# Patient Record
Sex: Female | Born: 1950 | Race: White | Hispanic: No | Smoking: Never smoker
Health system: Southern US, Community
[De-identification: ages and names within clinical notes are randomized; demographics above are authoritative.]

## PROBLEM LIST (undated history)

## (undated) DIAGNOSIS — F419 Anxiety disorder, unspecified: Secondary | ICD-10-CM

## (undated) DIAGNOSIS — J302 Other seasonal allergic rhinitis: Secondary | ICD-10-CM

## (undated) DIAGNOSIS — Z860100 Personal history of colon polyps, unspecified: Secondary | ICD-10-CM

## (undated) DIAGNOSIS — T7840XA Allergy, unspecified, initial encounter: Secondary | ICD-10-CM

## (undated) DIAGNOSIS — H9319 Tinnitus, unspecified ear: Secondary | ICD-10-CM

## (undated) DIAGNOSIS — E039 Hypothyroidism, unspecified: Secondary | ICD-10-CM

## (undated) DIAGNOSIS — E7800 Pure hypercholesterolemia, unspecified: Secondary | ICD-10-CM

## (undated) DIAGNOSIS — G4733 Obstructive sleep apnea (adult) (pediatric): Secondary | ICD-10-CM

## (undated) DIAGNOSIS — M1990 Unspecified osteoarthritis, unspecified site: Secondary | ICD-10-CM

## (undated) DIAGNOSIS — M8580 Other specified disorders of bone density and structure, unspecified site: Secondary | ICD-10-CM

## (undated) DIAGNOSIS — F329 Major depressive disorder, single episode, unspecified: Secondary | ICD-10-CM

## (undated) DIAGNOSIS — F32A Depression, unspecified: Secondary | ICD-10-CM

## (undated) DIAGNOSIS — I340 Nonrheumatic mitral (valve) insufficiency: Secondary | ICD-10-CM

## (undated) DIAGNOSIS — I5189 Other ill-defined heart diseases: Secondary | ICD-10-CM

## (undated) DIAGNOSIS — I10 Essential (primary) hypertension: Secondary | ICD-10-CM

## (undated) DIAGNOSIS — G4730 Sleep apnea, unspecified: Secondary | ICD-10-CM

## (undated) HISTORY — DX: Personal history of colon polyps, unspecified: Z86.0100

## (undated) HISTORY — DX: Unspecified osteoarthritis, unspecified site: M19.90

## (undated) HISTORY — DX: Major depressive disorder, single episode, unspecified: F32.9

## (undated) HISTORY — DX: Other specified disorders of bone density and structure, unspecified site: M85.80

## (undated) HISTORY — DX: Other seasonal allergic rhinitis: J30.2

## (undated) HISTORY — PX: POLYPECTOMY: SHX149

## (undated) HISTORY — DX: Anxiety disorder, unspecified: F41.9

## (undated) HISTORY — DX: Nonrheumatic mitral (valve) insufficiency: I34.0

## (undated) HISTORY — DX: Personal history of colonic polyps: Z86.010

## (undated) HISTORY — DX: Sleep apnea, unspecified: G47.30

## (undated) HISTORY — PX: COLONOSCOPY: SHX174

## (undated) HISTORY — DX: Hypothyroidism, unspecified: E03.9

## (undated) HISTORY — DX: Allergy, unspecified, initial encounter: T78.40XA

## (undated) HISTORY — PX: REPLACEMENT TOTAL KNEE: SUR1224

## (undated) HISTORY — DX: Other ill-defined heart diseases: I51.89

## (undated) HISTORY — DX: Obstructive sleep apnea (adult) (pediatric): G47.33

---

## 1999-02-06 ENCOUNTER — Ambulatory Visit (HOSPITAL_COMMUNITY): Admission: RE | Admit: 1999-02-06 | Discharge: 1999-02-06 | Payer: Self-pay | Admitting: Obstetrics and Gynecology

## 1999-02-06 ENCOUNTER — Encounter: Payer: Self-pay | Admitting: Obstetrics and Gynecology

## 2000-04-15 ENCOUNTER — Ambulatory Visit (HOSPITAL_COMMUNITY): Admission: RE | Admit: 2000-04-15 | Discharge: 2000-04-15 | Payer: Self-pay | Admitting: Obstetrics and Gynecology

## 2000-04-15 ENCOUNTER — Encounter: Payer: Self-pay | Admitting: Obstetrics and Gynecology

## 2001-04-28 ENCOUNTER — Ambulatory Visit (HOSPITAL_COMMUNITY): Admission: RE | Admit: 2001-04-28 | Discharge: 2001-04-28 | Payer: Self-pay | Admitting: Obstetrics and Gynecology

## 2001-04-28 ENCOUNTER — Encounter: Payer: Self-pay | Admitting: Obstetrics and Gynecology

## 2002-02-17 ENCOUNTER — Emergency Department (HOSPITAL_COMMUNITY): Admission: EM | Admit: 2002-02-17 | Discharge: 2002-02-17 | Payer: Self-pay | Admitting: Emergency Medicine

## 2002-04-15 ENCOUNTER — Other Ambulatory Visit: Admission: RE | Admit: 2002-04-15 | Discharge: 2002-04-15 | Payer: Self-pay | Admitting: Obstetrics and Gynecology

## 2002-05-18 ENCOUNTER — Encounter: Payer: Self-pay | Admitting: Obstetrics and Gynecology

## 2002-05-18 ENCOUNTER — Ambulatory Visit (HOSPITAL_COMMUNITY): Admission: RE | Admit: 2002-05-18 | Discharge: 2002-05-18 | Payer: Self-pay | Admitting: Obstetrics and Gynecology

## 2003-02-10 ENCOUNTER — Encounter: Payer: Self-pay | Admitting: Internal Medicine

## 2003-04-18 ENCOUNTER — Other Ambulatory Visit: Admission: RE | Admit: 2003-04-18 | Discharge: 2003-04-18 | Payer: Self-pay | Admitting: Obstetrics and Gynecology

## 2003-06-07 ENCOUNTER — Encounter: Payer: Self-pay | Admitting: Obstetrics and Gynecology

## 2003-06-07 ENCOUNTER — Ambulatory Visit (HOSPITAL_COMMUNITY): Admission: RE | Admit: 2003-06-07 | Discharge: 2003-06-07 | Payer: Self-pay | Admitting: Obstetrics and Gynecology

## 2003-06-16 ENCOUNTER — Encounter: Admission: RE | Admit: 2003-06-16 | Discharge: 2003-06-16 | Payer: Self-pay | Admitting: Obstetrics and Gynecology

## 2003-06-16 ENCOUNTER — Encounter: Payer: Self-pay | Admitting: Obstetrics and Gynecology

## 2004-06-01 ENCOUNTER — Other Ambulatory Visit: Admission: RE | Admit: 2004-06-01 | Discharge: 2004-06-01 | Payer: Self-pay | Admitting: Obstetrics and Gynecology

## 2004-06-19 ENCOUNTER — Ambulatory Visit (HOSPITAL_COMMUNITY): Admission: RE | Admit: 2004-06-19 | Discharge: 2004-06-19 | Payer: Self-pay | Admitting: Obstetrics and Gynecology

## 2004-08-25 ENCOUNTER — Encounter: Admission: RE | Admit: 2004-08-25 | Discharge: 2004-08-25 | Payer: Self-pay | Admitting: Internal Medicine

## 2004-11-13 ENCOUNTER — Ambulatory Visit: Payer: Self-pay | Admitting: Internal Medicine

## 2004-11-19 ENCOUNTER — Ambulatory Visit: Payer: Self-pay | Admitting: Internal Medicine

## 2005-01-21 ENCOUNTER — Ambulatory Visit: Payer: Self-pay | Admitting: Internal Medicine

## 2005-02-27 ENCOUNTER — Ambulatory Visit: Payer: Self-pay | Admitting: Internal Medicine

## 2005-06-04 ENCOUNTER — Other Ambulatory Visit: Admission: RE | Admit: 2005-06-04 | Discharge: 2005-06-04 | Payer: Self-pay | Admitting: Obstetrics and Gynecology

## 2005-07-09 ENCOUNTER — Ambulatory Visit (HOSPITAL_COMMUNITY): Admission: RE | Admit: 2005-07-09 | Discharge: 2005-07-09 | Payer: Self-pay | Admitting: Obstetrics and Gynecology

## 2005-07-16 ENCOUNTER — Ambulatory Visit: Payer: Self-pay | Admitting: Internal Medicine

## 2005-12-17 ENCOUNTER — Ambulatory Visit: Payer: Self-pay | Admitting: Internal Medicine

## 2006-02-26 ENCOUNTER — Ambulatory Visit: Payer: Self-pay | Admitting: Family Medicine

## 2006-03-26 ENCOUNTER — Ambulatory Visit: Payer: Self-pay | Admitting: Internal Medicine

## 2006-04-01 ENCOUNTER — Ambulatory Visit: Payer: Self-pay | Admitting: Internal Medicine

## 2006-04-16 ENCOUNTER — Ambulatory Visit: Payer: Self-pay | Admitting: Internal Medicine

## 2006-05-13 ENCOUNTER — Ambulatory Visit: Payer: Self-pay | Admitting: Internal Medicine

## 2006-06-05 ENCOUNTER — Other Ambulatory Visit: Admission: RE | Admit: 2006-06-05 | Discharge: 2006-06-05 | Payer: Self-pay | Admitting: Obstetrics and Gynecology

## 2006-06-14 ENCOUNTER — Encounter: Admission: RE | Admit: 2006-06-14 | Discharge: 2006-06-14 | Payer: Self-pay | Admitting: Internal Medicine

## 2006-07-14 ENCOUNTER — Ambulatory Visit (HOSPITAL_COMMUNITY): Admission: RE | Admit: 2006-07-14 | Discharge: 2006-07-14 | Payer: Self-pay | Admitting: Obstetrics and Gynecology

## 2006-07-25 ENCOUNTER — Encounter: Payer: Self-pay | Admitting: Internal Medicine

## 2006-08-26 ENCOUNTER — Ambulatory Visit: Payer: Self-pay | Admitting: Internal Medicine

## 2006-09-10 ENCOUNTER — Ambulatory Visit: Payer: Self-pay | Admitting: Internal Medicine

## 2006-09-19 ENCOUNTER — Ambulatory Visit: Payer: Self-pay | Admitting: Internal Medicine

## 2006-09-26 ENCOUNTER — Ambulatory Visit: Payer: Self-pay | Admitting: Internal Medicine

## 2006-10-03 ENCOUNTER — Ambulatory Visit: Payer: Self-pay | Admitting: Internal Medicine

## 2007-01-15 ENCOUNTER — Ambulatory Visit: Payer: Self-pay | Admitting: Internal Medicine

## 2007-03-23 ENCOUNTER — Ambulatory Visit: Payer: Self-pay | Admitting: Internal Medicine

## 2007-04-22 ENCOUNTER — Inpatient Hospital Stay (HOSPITAL_COMMUNITY): Admission: RE | Admit: 2007-04-22 | Discharge: 2007-04-25 | Payer: Self-pay | Admitting: Orthopedic Surgery

## 2007-04-22 HISTORY — PX: TOTAL KNEE ARTHROPLASTY: SHX125

## 2007-05-07 ENCOUNTER — Encounter (INDEPENDENT_AMBULATORY_CARE_PROVIDER_SITE_OTHER): Payer: Self-pay | Admitting: Orthopedic Surgery

## 2007-05-07 ENCOUNTER — Ambulatory Visit (HOSPITAL_COMMUNITY): Admission: RE | Admit: 2007-05-07 | Discharge: 2007-05-07 | Payer: Self-pay | Admitting: Orthopedic Surgery

## 2007-06-16 ENCOUNTER — Ambulatory Visit: Payer: Self-pay | Admitting: Internal Medicine

## 2007-06-16 LAB — CONVERTED CEMR LAB
ALT: 21 units/L (ref 0–40)
Alkaline Phosphatase: 70 units/L (ref 39–117)
Bilirubin, Direct: 0.1 mg/dL (ref 0.0–0.3)
HDL: 48.9 mg/dL (ref >39.0)
LDL Cholesterol: 100 mg/dL — ABNORMAL HIGH (ref 0–99)
Total Bilirubin: 0.4 mg/dL (ref 0.3–1.2)
Triglycerides: 85 mg/dL (ref 0–149)

## 2007-06-24 ENCOUNTER — Ambulatory Visit (HOSPITAL_COMMUNITY): Admission: RE | Admit: 2007-06-24 | Discharge: 2007-06-24 | Payer: Self-pay | Admitting: Orthopedic Surgery

## 2007-06-29 DIAGNOSIS — M1990 Unspecified osteoarthritis, unspecified site: Secondary | ICD-10-CM | POA: Insufficient documentation

## 2007-06-29 DIAGNOSIS — E039 Hypothyroidism, unspecified: Secondary | ICD-10-CM | POA: Insufficient documentation

## 2007-06-29 DIAGNOSIS — J309 Allergic rhinitis, unspecified: Secondary | ICD-10-CM | POA: Insufficient documentation

## 2007-06-29 HISTORY — DX: Unspecified osteoarthritis, unspecified site: M19.90

## 2007-07-14 ENCOUNTER — Encounter: Payer: Self-pay | Admitting: Internal Medicine

## 2007-07-14 ENCOUNTER — Ambulatory Visit: Payer: Self-pay | Admitting: Internal Medicine

## 2007-07-14 DIAGNOSIS — E782 Mixed hyperlipidemia: Secondary | ICD-10-CM | POA: Insufficient documentation

## 2007-07-14 DIAGNOSIS — E785 Hyperlipidemia, unspecified: Secondary | ICD-10-CM | POA: Insufficient documentation

## 2007-07-23 ENCOUNTER — Ambulatory Visit: Payer: Self-pay | Admitting: Internal Medicine

## 2007-07-23 DIAGNOSIS — R350 Frequency of micturition: Secondary | ICD-10-CM | POA: Insufficient documentation

## 2007-07-23 DIAGNOSIS — R7309 Other abnormal glucose: Secondary | ICD-10-CM | POA: Insufficient documentation

## 2007-07-23 LAB — CONVERTED CEMR LAB: Hgb A1c MFr Bld: 5.8 % (ref 4.6–6.0)

## 2007-07-24 ENCOUNTER — Encounter: Payer: Self-pay | Admitting: Internal Medicine

## 2007-07-28 ENCOUNTER — Ambulatory Visit (HOSPITAL_COMMUNITY): Admission: RE | Admit: 2007-07-28 | Discharge: 2007-07-28 | Payer: Self-pay | Admitting: Obstetrics and Gynecology

## 2007-08-04 ENCOUNTER — Other Ambulatory Visit: Admission: RE | Admit: 2007-08-04 | Discharge: 2007-08-04 | Payer: Self-pay | Admitting: Obstetrics and Gynecology

## 2007-09-25 ENCOUNTER — Telehealth: Payer: Self-pay | Admitting: *Deleted

## 2007-10-15 ENCOUNTER — Ambulatory Visit: Payer: Self-pay | Admitting: Internal Medicine

## 2007-10-15 LAB — CONVERTED CEMR LAB
AST: 19 units/L (ref 0–37)
Albumin: 4 g/dL (ref 3.5–5.2)
Alkaline Phosphatase: 64 units/L (ref 39–117)
BUN: 18 mg/dL (ref 6–23)
Basophils Absolute: 0.1 10*3/uL (ref 0.0–0.1)
Basophils Relative: 1 % (ref 0.0–1.0)
Blood in Urine, dipstick: NEGATIVE
CO2: 26 meq/L (ref 19–32)
Calcium: 8.9 mg/dL (ref 8.4–10.5)
Cholesterol: 172 mg/dL (ref 0–200)
Creatinine, Ser: 0.6 mg/dL (ref 0.4–1.2)
Eosinophils Absolute: 0.5 10*3/uL (ref 0.0–0.6)
Eosinophils Relative: 6.4 % — ABNORMAL HIGH (ref 0.0–5.0)
HCT: 35.2 % — ABNORMAL LOW (ref 36.0–46.0)
Ketones, urine, test strip: NEGATIVE
LDL Cholesterol: 108 mg/dL — ABNORMAL HIGH (ref 0–99)
Monocytes Absolute: 0.4 10*3/uL (ref 0.2–0.7)
Potassium: 4.5 meq/L (ref 3.5–5.1)
RDW: 12.6 % (ref 11.5–14.6)
TSH: 1.41 microintl units/mL (ref 0.35–5.50)
Urobilinogen, UA: 0.2
VLDL: 11 mg/dL (ref 0–40)
WBC Urine, dipstick: NEGATIVE

## 2007-10-28 ENCOUNTER — Ambulatory Visit: Payer: Self-pay | Admitting: Internal Medicine

## 2007-11-18 ENCOUNTER — Telehealth: Payer: Self-pay | Admitting: Internal Medicine

## 2007-11-19 ENCOUNTER — Telehealth (INDEPENDENT_AMBULATORY_CARE_PROVIDER_SITE_OTHER): Payer: Self-pay | Admitting: *Deleted

## 2008-01-18 ENCOUNTER — Ambulatory Visit: Payer: Self-pay | Admitting: Internal Medicine

## 2008-01-18 DIAGNOSIS — Z86010 Personal history of colonic polyps: Secondary | ICD-10-CM | POA: Insufficient documentation

## 2008-01-18 DIAGNOSIS — N951 Menopausal and female climacteric states: Secondary | ICD-10-CM | POA: Insufficient documentation

## 2008-01-18 LAB — CONVERTED CEMR LAB: HDL goal, serum: 40 mg/dL

## 2008-03-28 ENCOUNTER — Ambulatory Visit: Payer: Self-pay | Admitting: Internal Medicine

## 2008-03-28 ENCOUNTER — Encounter: Payer: Self-pay | Admitting: Internal Medicine

## 2008-03-29 DIAGNOSIS — M949 Disorder of cartilage, unspecified: Secondary | ICD-10-CM

## 2008-03-29 DIAGNOSIS — M899 Disorder of bone, unspecified: Secondary | ICD-10-CM | POA: Insufficient documentation

## 2008-04-25 ENCOUNTER — Telehealth: Payer: Self-pay | Admitting: Internal Medicine

## 2008-05-03 ENCOUNTER — Telehealth: Payer: Self-pay | Admitting: Internal Medicine

## 2008-06-07 ENCOUNTER — Telehealth: Payer: Self-pay | Admitting: *Deleted

## 2008-07-14 ENCOUNTER — Ambulatory Visit: Payer: Self-pay | Admitting: Internal Medicine

## 2008-07-14 DIAGNOSIS — R5381 Other malaise: Secondary | ICD-10-CM | POA: Insufficient documentation

## 2008-07-14 DIAGNOSIS — R5383 Other fatigue: Secondary | ICD-10-CM

## 2008-07-14 LAB — CONVERTED CEMR LAB
Basophils Relative: 0.9 % (ref 0.0–3.0)
Eosinophils Relative: 5.3 % — ABNORMAL HIGH (ref 0.0–5.0)
Free T4: 1 ng/dL (ref 0.6–1.6)
HCT: 35.3 % — ABNORMAL LOW (ref 36.0–46.0)
Lymphocytes Relative: 33.9 % (ref 12.0–46.0)
MCHC: 34.8 g/dL (ref 30.0–36.0)
Monocytes Relative: 5.2 % (ref 3.0–12.0)
Neutro Abs: 4.2 10*3/uL (ref 1.4–7.7)
Neutrophils Relative %: 54.7 % (ref 43.0–77.0)
Platelets: 286 10*3/uL (ref 150–400)
Saturation Ratios: 13.9 % — ABNORMAL LOW (ref 20.0–50.0)
T3, Free: 3.3 pg/mL (ref 2.3–4.2)
TSH: 0.74 microintl units/mL (ref 0.35–5.50)

## 2008-07-18 ENCOUNTER — Telehealth: Payer: Self-pay | Admitting: Internal Medicine

## 2008-07-21 ENCOUNTER — Telehealth: Payer: Self-pay | Admitting: Internal Medicine

## 2008-08-26 ENCOUNTER — Other Ambulatory Visit: Admission: RE | Admit: 2008-08-26 | Discharge: 2008-08-26 | Payer: Self-pay | Admitting: Obstetrics and Gynecology

## 2008-09-30 ENCOUNTER — Ambulatory Visit: Payer: Self-pay | Admitting: Internal Medicine

## 2008-10-18 ENCOUNTER — Ambulatory Visit (HOSPITAL_COMMUNITY): Admission: RE | Admit: 2008-10-18 | Discharge: 2008-10-18 | Payer: Self-pay | Admitting: Obstetrics and Gynecology

## 2008-12-14 ENCOUNTER — Telehealth: Payer: Self-pay | Admitting: Family Medicine

## 2009-02-15 ENCOUNTER — Ambulatory Visit: Payer: Self-pay | Admitting: Internal Medicine

## 2009-02-15 DIAGNOSIS — L570 Actinic keratosis: Secondary | ICD-10-CM | POA: Insufficient documentation

## 2009-03-02 ENCOUNTER — Ambulatory Visit: Payer: Self-pay | Admitting: Internal Medicine

## 2009-03-02 DIAGNOSIS — J069 Acute upper respiratory infection, unspecified: Secondary | ICD-10-CM | POA: Insufficient documentation

## 2009-04-11 ENCOUNTER — Encounter: Payer: Self-pay | Admitting: Internal Medicine

## 2009-04-11 DIAGNOSIS — R0989 Other specified symptoms and signs involving the circulatory and respiratory systems: Secondary | ICD-10-CM

## 2009-04-11 DIAGNOSIS — R0609 Other forms of dyspnea: Secondary | ICD-10-CM | POA: Insufficient documentation

## 2009-05-26 ENCOUNTER — Encounter: Payer: Self-pay | Admitting: Internal Medicine

## 2009-05-26 ENCOUNTER — Ambulatory Visit (HOSPITAL_BASED_OUTPATIENT_CLINIC_OR_DEPARTMENT_OTHER): Admission: RE | Admit: 2009-05-26 | Discharge: 2009-05-26 | Payer: Self-pay | Admitting: Internal Medicine

## 2009-06-06 ENCOUNTER — Ambulatory Visit: Payer: Self-pay | Admitting: Pulmonary Disease

## 2009-06-06 ENCOUNTER — Telehealth: Payer: Self-pay | Admitting: Internal Medicine

## 2009-06-07 ENCOUNTER — Ambulatory Visit: Payer: Self-pay | Admitting: Internal Medicine

## 2009-06-07 LAB — CONVERTED CEMR LAB
ALT: 17 units/L (ref 0–35)
AST: 21 units/L (ref 0–37)
Alkaline Phosphatase: 68 units/L (ref 39–117)
BUN: 18 mg/dL (ref 6–23)
Basophils Relative: 0.3 % (ref 0.0–3.0)
Bilirubin Urine: NEGATIVE
Calcium: 9 mg/dL (ref 8.4–10.5)
Eosinophils Relative: 5 % (ref 0.0–5.0)
GFR calc non Af Amer: 108.4 mL/min (ref >60)
Glucose, Bld: 106 mg/dL — ABNORMAL HIGH (ref 70–99)
HCT: 36.9 % (ref 36.0–46.0)
HDL: 55.3 mg/dL (ref >39.00)
Hemoglobin: 12.9 g/dL (ref 12.0–15.0)
Lymphocytes Relative: 29.5 % (ref 12.0–46.0)
MCHC: 34.8 g/dL (ref 30.0–36.0)
MCV: 91.5 fL (ref 78.0–100.0)
Neutrophils Relative %: 59.4 % (ref 43.0–77.0)
Platelets: 269 10*3/uL (ref 150.0–400.0)
Potassium: 4.3 meq/L (ref 3.5–5.1)
RDW: 12.7 % (ref 11.5–14.6)
Specific Gravity, Urine: 1.015
Triglycerides: 68 mg/dL (ref 0.0–149.0)
VLDL: 13.6 mg/dL (ref 0.0–40.0)
WBC Urine, dipstick: NEGATIVE

## 2009-06-09 ENCOUNTER — Telehealth: Payer: Self-pay | Admitting: *Deleted

## 2009-06-22 ENCOUNTER — Ambulatory Visit: Payer: Self-pay | Admitting: Internal Medicine

## 2009-06-22 DIAGNOSIS — E663 Overweight: Secondary | ICD-10-CM | POA: Insufficient documentation

## 2009-06-22 LAB — HM MAMMOGRAPHY

## 2009-08-22 ENCOUNTER — Telehealth: Payer: Self-pay | Admitting: Internal Medicine

## 2009-10-13 ENCOUNTER — Encounter: Payer: Self-pay | Admitting: Obstetrics and Gynecology

## 2009-10-13 ENCOUNTER — Ambulatory Visit: Payer: Self-pay | Admitting: Obstetrics and Gynecology

## 2009-10-13 ENCOUNTER — Other Ambulatory Visit: Admission: RE | Admit: 2009-10-13 | Discharge: 2009-10-13 | Payer: Self-pay | Admitting: Obstetrics and Gynecology

## 2009-10-31 ENCOUNTER — Ambulatory Visit (HOSPITAL_COMMUNITY): Admission: RE | Admit: 2009-10-31 | Discharge: 2009-10-31 | Payer: Self-pay | Admitting: Obstetrics and Gynecology

## 2010-01-11 ENCOUNTER — Encounter (INDEPENDENT_AMBULATORY_CARE_PROVIDER_SITE_OTHER): Payer: Self-pay | Admitting: *Deleted

## 2010-03-04 ENCOUNTER — Encounter: Payer: Self-pay | Admitting: Internal Medicine

## 2010-04-17 ENCOUNTER — Encounter: Payer: Self-pay | Admitting: Internal Medicine

## 2010-06-15 ENCOUNTER — Encounter: Payer: Self-pay | Admitting: Internal Medicine

## 2010-06-15 ENCOUNTER — Ambulatory Visit: Payer: Self-pay | Admitting: Family Medicine

## 2010-06-15 DIAGNOSIS — R0602 Shortness of breath: Secondary | ICD-10-CM | POA: Insufficient documentation

## 2010-06-18 LAB — CONVERTED CEMR LAB
Alkaline Phosphatase: 67 units/L (ref 39–117)
CO2: 29 meq/L (ref 19–32)
Chloride: 108 meq/L (ref 96–112)
Creatinine, Ser: 0.6 mg/dL (ref 0.4–1.2)
Eosinophils Absolute: 0.3 10*3/uL (ref 0.0–0.7)
Free T4: 1.14 ng/dL (ref 0.60–1.60)
HCT: 38.2 % (ref 36.0–46.0)
Hemoglobin: 12.9 g/dL (ref 12.0–15.0)
Hgb A1c MFr Bld: 6 % (ref 4.6–6.5)
Lymphocytes Relative: 32.1 % (ref 12.0–46.0)
Lymphs Abs: 2.4 10*3/uL (ref 0.7–4.0)
MCHC: 33.8 g/dL (ref 30.0–36.0)
Neutrophils Relative %: 58.7 % (ref 43.0–77.0)
Phosphorus: 4 mg/dL (ref 2.3–4.6)
Platelets: 295 10*3/uL (ref 150.0–400.0)
Potassium: 4.3 meq/L (ref 3.5–5.1)
RBC: 4.08 M/uL (ref 3.87–5.11)
RDW: 13.4 % (ref 11.5–14.6)
Sodium: 145 meq/L (ref 135–145)
T3, Free: 3.3 pg/mL (ref 2.3–4.2)
Total CHOL/HDL Ratio: 3
Triglycerides: 72 mg/dL (ref 0.0–149.0)
WBC: 7.6 10*3/uL (ref 4.5–10.5)

## 2010-06-25 ENCOUNTER — Ambulatory Visit: Payer: Self-pay

## 2010-06-25 ENCOUNTER — Ambulatory Visit (HOSPITAL_COMMUNITY): Admission: RE | Admit: 2010-06-25 | Discharge: 2010-06-25 | Payer: Self-pay | Admitting: Family Medicine

## 2010-06-25 ENCOUNTER — Ambulatory Visit: Payer: Self-pay | Admitting: Cardiology

## 2010-06-25 ENCOUNTER — Encounter: Payer: Self-pay | Admitting: Family Medicine

## 2010-06-26 DIAGNOSIS — I5189 Other ill-defined heart diseases: Secondary | ICD-10-CM | POA: Insufficient documentation

## 2010-07-04 ENCOUNTER — Ambulatory Visit: Payer: Self-pay | Admitting: Pulmonary Disease

## 2010-07-04 DIAGNOSIS — G4733 Obstructive sleep apnea (adult) (pediatric): Secondary | ICD-10-CM | POA: Insufficient documentation

## 2010-07-24 ENCOUNTER — Ambulatory Visit: Payer: Self-pay | Admitting: Family Medicine

## 2010-07-24 DIAGNOSIS — I517 Cardiomegaly: Secondary | ICD-10-CM | POA: Insufficient documentation

## 2010-07-24 DIAGNOSIS — I340 Nonrheumatic mitral (valve) insufficiency: Secondary | ICD-10-CM | POA: Insufficient documentation

## 2010-08-10 ENCOUNTER — Ambulatory Visit: Payer: Self-pay | Admitting: Internal Medicine

## 2010-08-11 ENCOUNTER — Encounter: Payer: Self-pay | Admitting: Pulmonary Disease

## 2010-08-15 ENCOUNTER — Encounter: Payer: Self-pay | Admitting: Internal Medicine

## 2010-08-15 ENCOUNTER — Telehealth (INDEPENDENT_AMBULATORY_CARE_PROVIDER_SITE_OTHER): Payer: Self-pay | Admitting: *Deleted

## 2010-08-16 ENCOUNTER — Encounter (HOSPITAL_COMMUNITY): Admission: RE | Admit: 2010-08-16 | Discharge: 2010-09-24 | Payer: Self-pay | Admitting: Internal Medicine

## 2010-08-16 ENCOUNTER — Ambulatory Visit: Payer: Self-pay

## 2010-08-16 ENCOUNTER — Ambulatory Visit: Payer: Self-pay | Admitting: Internal Medicine

## 2010-08-22 ENCOUNTER — Telehealth: Payer: Self-pay | Admitting: Gastroenterology

## 2010-09-04 ENCOUNTER — Telehealth: Payer: Self-pay | Admitting: Internal Medicine

## 2010-09-05 ENCOUNTER — Encounter (INDEPENDENT_AMBULATORY_CARE_PROVIDER_SITE_OTHER): Payer: Self-pay | Admitting: *Deleted

## 2010-10-23 ENCOUNTER — Ambulatory Visit: Payer: Self-pay | Admitting: Obstetrics and Gynecology

## 2010-10-23 ENCOUNTER — Other Ambulatory Visit: Admission: RE | Admit: 2010-10-23 | Discharge: 2010-10-23 | Payer: Self-pay | Admitting: Obstetrics and Gynecology

## 2010-10-24 ENCOUNTER — Encounter: Payer: Self-pay | Admitting: Internal Medicine

## 2010-10-29 ENCOUNTER — Ambulatory Visit: Payer: Self-pay | Admitting: Family Medicine

## 2010-10-29 ENCOUNTER — Encounter (INDEPENDENT_AMBULATORY_CARE_PROVIDER_SITE_OTHER): Payer: Self-pay | Admitting: *Deleted

## 2010-10-29 DIAGNOSIS — J019 Acute sinusitis, unspecified: Secondary | ICD-10-CM | POA: Insufficient documentation

## 2010-11-01 ENCOUNTER — Ambulatory Visit (HOSPITAL_COMMUNITY): Admission: RE | Admit: 2010-11-01 | Discharge: 2010-11-01 | Payer: Self-pay | Admitting: Obstetrics and Gynecology

## 2010-11-01 ENCOUNTER — Ambulatory Visit: Payer: Self-pay | Admitting: Gastroenterology

## 2010-11-14 ENCOUNTER — Encounter: Payer: Self-pay | Admitting: *Deleted

## 2010-11-15 ENCOUNTER — Ambulatory Visit: Payer: Self-pay | Admitting: Gastroenterology

## 2010-11-18 ENCOUNTER — Encounter: Payer: Self-pay | Admitting: Gastroenterology

## 2011-01-08 ENCOUNTER — Ambulatory Visit
Admission: RE | Admit: 2011-01-08 | Discharge: 2011-01-08 | Payer: Self-pay | Source: Home / Self Care | Attending: Internal Medicine | Admitting: Internal Medicine

## 2011-01-24 ENCOUNTER — Telehealth (INDEPENDENT_AMBULATORY_CARE_PROVIDER_SITE_OTHER): Payer: Self-pay | Admitting: *Deleted

## 2011-01-29 NOTE — Assessment & Plan Note (Signed)
Summary: SINUSITIS? // RS   Vital Signs:  Patient profile:   60 year old female Height:      63 inches (160.02 cm) Weight:      156 pounds (70.91 kg) O2 Sat:      97 % on Room air Temp:     98.4 degrees F (36.89 degrees C) oral Pulse rate:   93 / minute BP sitting:   142 / 82  (left arm) Cuff size:   regular  Vitals Entered By: Josph Macho RMA (October 29, 2010 10:51 AM)  O2 Flow:  Room air CC: Sinusitis X1 week- headache and sinus pressure/ CF Is Patient Diabetic? No   History of Present Illness: Patient is 60 yo nonsmoker in today for evaluation of persistent sinusitis. she was seen in urgent care clinic last Wednesday and placed on amoxicillin but continues to have significant sinus pressure bilaterally in her for that in her cheeks behind her eyes. She has a chronic headache, malaise and fatigue. She reports some ringing in her ear but no significant ear pain, no throat pain, no fevers, no chills, no chest pain, palpitations, shortness of breath, GI or GU complaints. She has tried some Advil cold and sinus preparations, some Mucinex D as just started the nasal saline washes and nothing has been helpful to date.   Current Medications (verified): 1)  Crestor 5 Mg  Tabs (Rosuvastatin Calcium) .... One By Mouth Q Hs 2)  Cymbalta 30 Mg  Cpep (Duloxetine Hcl) .... One By Mouth Daily 3)  Synthroid 88 Mcg  Tabs (Levothyroxine Sodium) .... One By Mouth Daily 30 Min Prior To Eating 4)  Nasonex 50 Mcg/act  Susp (Mometasone Furoate) .... Two Spray Q Nare Daily 5)  Estradiol 1 Mg  Tabs (Estradiol) .... Once Daily 6)  Prometrium 100 Mg  Caps (Progesterone Micronized) .... Once Daily 7)  Slow Fe 160 (50 Fe) Mg Cr-Tabs (Ferrous Sulfate Dried) .... As Needed 8)  Diazepam 2 Mg Tabs (Diazepam) .[name].. 1 Tab By Mouth At Bedtime As Needed Insomnia  Allergies (verified): 1)  ! Sulfa  Past History:  Past medical history reviewed for relevance to current acute and chronic problems. Social  history (including risk factors) reviewed for relevance to current acute and chronic problems.  Past Medical History: Reviewed history from 08/10/2010 and no changes required. Hypothyroidism Osteoarthritis Allergic rhinitis-seasonal DJD right knee Allergic rhinitis menopausal Colonic polyps, hx of mild osa Mitral regurgitation Diastolic dysfunction.  Social History: Reviewed history from 07/14/2007 and no changes required. Never Smoked Married Occupation:banker  Review of Systems      See HPI  Physical Exam  General:  Well-developed,well-nourished,in no acute distress; alert,appropriate and cooperative throughout examination Head:  Normocephalic and atraumatic without obvious abnormalities. No apparent alopecia or balding. Nose:  External nasal examination shows no deformity or inflammation. Nasal mucosa are pink and moist without lesions or exudates. Mouth:  Oral mucosa and oropharynx without lesions or exudates.  Teeth in good repair. Neck:  No deformities, masses, or tenderness noted. Lungs:  Normal respiratory effort, chest expands symmetrically. Lungs are clear to auscultation, no crackles or wheezes. Heart:  Normal rate and regular rhythm. S1 and S2 normal without gallop, murmur, click, rub or other extra sounds. Abdomen:  Bowel sounds positive,abdomen soft and non-tender without masses, organomegaly or hernias noted. Extremities:  No clubbing, cyanosis, edema, or deformity noted with normal full range of motion of all joints.   Cervical Nodes:  No lymphadenopathy noted Psych:  Cognition and judgment appear  intact. Alert and cooperative with normal attention span and concentration. No apparent delusions, illusions, hallucinations   Impression & Recommendations:  Problem # 1:  SINUSITIS, ACUTE (ICD-461.9)  Her updated medication list for this problem includes:    Nasonex 50 Mcg/act Susp (Mometasone furoate) .[name]..[name] Two spray q nare daily    Levaquin 500 Mg Tabs  (Levofloxacin) .[name]... 1 tab by mouth daily    Mucinex 600 Mg Xr12h-tab (Guaifenesin) .[name]... 1 tab by mouth by mouth two times a day x 10 day Will stop the Amox for no response  Complete Medication List: 1)  Crestor 5 Mg Tabs (Rosuvastatin calcium) .... One by mouth q hs 2)  Cymbalta 30 Mg Cpep (Duloxetine hcl) .... One by mouth daily 3)  Synthroid 88 Mcg Tabs (Levothyroxine sodium) .... One by mouth daily 30 min prior to eating 4)  Nasonex 50 Mcg/act Susp (Mometasone furoate) .... Two spray q nare daily 5)  Estradiol 1 Mg Tabs (Estradiol) .... Once daily 6)  Prometrium 100 Mg Caps (Progesterone micronized) .... Once daily 7)  Slow Fe 160 (50 Fe) Mg Cr-tabs (Ferrous sulfate dried) .... As needed 8)  Diazepam 2 Mg Tabs (Diazepam) .[name].. 1 tab by mouth at bedtime as needed insomnia 9)  Levaquin 500 Mg Tabs (Levofloxacin) .[name].. 1 tab by mouth daily 10)  Phillips Colon Health Caps (Probiotic product) .[name].. 1 cap by mouth daily 11)  Mucinex 600 Mg Xr12h-tab (Guaifenesin) .[name].. 1 tab by mouth by mouth two times a day x 10 day  Patient Instructions: 1)  Take your antibiotic as prescribed until ALL of it is gone, but stop if you develop a rash or swelling and contact our office as soon as possible.  2)  Acute sinusitis symptoms for less than 10 days are not helped by antibiotics. Use warm moist compresses, and over the counter decongestants( only as directed). Call if no improvement in 5-7 days, sooner if increasing pain, fever, or new symptoms.  Prescriptions: LEVAQUIN 500 MG TABS (LEVOFLOXACIN) 1 tab by mouth daily  #10 x 0   Entered and Authorized by:   Danise Edge MD   Signed by:   Danise Edge MD on 10/29/2010   Method used:   Electronically to        [id number] ([facility]ail[facility]       [address]       [place], [place]  [address]       Ph: [phone] or [phone]       Fax: [phone]   RxID:   [id number]    Orders Added: 1)  Est. Patient Level II [[address]]

## 2011-01-29 NOTE — Letter (Signed)
[facility]   Im[facility] By: Maryln Gottron 04/23/2010 12:33:55  _____________________________________________________________________  External Attachment:    Type:   Image     Comment:   External Document

## 2011-01-29 NOTE — Assessment & Plan Note (Signed)
Summary: 1 month rov/njr   Vital Signs:  Patient profile:   60 year old female Height:      63 inches (160.02 cm) Weight:      157 pounds (71.36 kg) O2 Sat:      95 % on Room air Temp:     97.8 degrees F (36.56 degrees C) oral Pulse rate:   74 / minute BP sitting:   112 / 80  (left arm) Cuff size:   regular  Vitals Entered By: Josph Macho RMA (July 24, 2010 8:20 AM)  O2 Flow:  Room air CC: 1 month follow up/ CF Is Patient Diabetic? No   History of Present Illness: Patient in today for follow up on her new patient appt. She is generally feeling well but does note persistent fatigue. She has proceeded with the sleep study and echo as ordered at her initial visit and she was found to have sleep apnea/mild MR/mild LAE and diastolic heart dysfunction. She has been referred to cardiology but has not seen them yet. She has only used her CPAP a couple of times and acknowledges having trouble resting with it on but understands the importance of doing so. She has taken some Diazepam and Ibuprofen to help at times. She is denying any new c/o just has her persistnet fatigue. No CP/palp/SOB/acute illness/f/c/GI or GU c/o.  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) 2)  Mitral Regurgitation, Mild  (ICD-396.3) 3)  Obstructive Sleep Apnea  (ICD-327.23) 4)  Diastolic Dysfunction  (ICD-429.9) 5)  Dyspnea  (ICD-786.05) 6)  Osteopenia  (ICD-733.90) 7)  Overweight  (ICD-278.02) 8)  Snoring  (ICD-786.09) 9)  Uri  (ICD-465.9) 10)  Actinic Keratosis  (ICD-702.0) 11)  Malaise and Fatigue  (ICD-780.79) 12)  Colonic Polyps, Hx of  (ICD-V12.72) 13)  Symptomatic Menopausal/female Climacteric States  (ICD-627.2) 14)  Physical Examination  (ICD-V70.0) 15)  Abnormal Glucose Nec  (ICD-790.29) 16)  Frequency, Urinary  (ICD-788.41) 17)  Hyperlipidemia Nec/nos  (ICD-272.4) 18)  Allergic Rhinitis  (ICD-477.9) 19)  Osteoarthritis  (ICD-715.90) 20)  Hypothyroidism   (ICD-244.9) 21)  Hrt  (ICD-V07.4)  Current Medications (verified): 1)  Crestor 5 Mg  Tabs (Rosuvastatin Calcium) .... One By Mouth Q Hs 2)  Cymbalta 30 Mg  Cpep (Duloxetine Hcl) .... One By Mouth Daily 3)  Synthroid 88 Mcg  Tabs (Levothyroxine Sodium) .... One By Mouth Daily 30 Min Prior To Eating 4)  Nasonex 50 Mcg/act  Susp (Mometasone Furoate) .... Two Spray Q Nare Daily 5)  Estradiol 1 Mg  Tabs (Estradiol) .... Once Daily 6)  Prometrium 100 Mg  Caps (Progesterone Micronized) .... Once Daily 7)  Slow Fe 160 (50 Fe) Mg Cr-Tabs (Ferrous Sulfate Dried) .... As Needed  Allergies (verified): 1)  ! Sulfa  Past History:  Past medical history reviewed for relevance to current acute and chronic problems. Social history (including risk factors) reviewed for relevance to current acute and chronic problems.  Past Medical History: Reviewed history from 07/04/2010 and no changes required. Hypothyroidism Osteoarthritis Allergic rhinitis-seasonal DJD right knee Allergic rhinitis menopausal Colonic polyps, hx of mild osa  Social History: Reviewed history from 07/14/2007 and no changes required. Never Smoked Married Occupation:banker  Physical Exam  General:  Well-developed,well-nourished,in no acute distress; alert,appropriate and cooperative throughout examination Head:  Normocephalic and atraumatic without obvious abnormalities Eyes:  No corneal or conjunctival inflammation noted. EOMI.  Mouth:  Oral mucosa and oropharynx without lesions or exudates.  Teeth in good repair. Neck:  No  deformities, masses, or tenderness noted. Lungs:  Normal respiratory effort, chest expands symmetrically. Lungs are clear to auscultation, no crackles or wheezes. Heart:  Normal rate and regular rhythm. S1 and S2 normal without gallop, murmur, click, rub or other extra sounds. Abdomen:  Bowel sounds positive,abdomen soft and non-tender without masses, organomegaly or hernias noted. Msk:  No deformity or  scoliosis noted of thoracic or lumbar spine.   Extremities:  No clubbing, cyanosis, edema, or deformity noted with normal full range of motion of all joints.   Skin:  Intact without suspicious lesions or rashes Psych:  Cognition and judgment appear intact. Alert and cooperative with normal attention span and concentration. No apparent delusions, illusions, hallucinations   Impression & Recommendations:  Problem # 1:  OBSTRUCTIVE SLEEP APNEA (ICD-327.23) Is folllowing with pulmonology and stressed importance of compliance with CPAP to control untoward consequences in the future such as worsening HTN, cardiomegaly, CHF Is given an rx for Diazepam 2mg  to use at bedtime as needed to help her adjust to the CPAP machine  Problem # 2:  MITRAL REGURGITATION, MILD (ICD-396.3) Has been referred to cardiology for further evaluation and monitoring of this condition and her diastolic dysfunction  Problem # 3:  DIASTOLIC DYSFUNCTION (ICD-429.9) Attempt weight loss and control apnea with CPAP  Problem # 4:  MALAISE AND FATIGUE (ICD-780.79) Multifactorial, continue to monitor and repeat labs in 6-8 months and use CPAP regularly  Complete Medication List: 1)  Crestor 5 Mg Tabs (Rosuvastatin calcium) .... One by mouth q hs 2)  Cymbalta 30 Mg Cpep (Duloxetine hcl) .... One by mouth daily 3)  Synthroid 88 Mcg Tabs (Levothyroxine sodium) .... One by mouth daily 30 min prior to eating 4)  Nasonex 50 Mcg/act Susp (Mometasone furoate) .... Two spray q nare daily 5)  Estradiol 1 Mg Tabs (Estradiol) .... Once daily 6)  Prometrium 100 Mg Caps (Progesterone micronized) .... Once daily 7)  Slow Fe 160 (50 Fe) Mg Cr-tabs (Ferrous sulfate dried) .... As needed 8)  Diazepam 2 Mg Tabs (Diazepam) .[name].. 1 tab by mouth at bedtime as needed insomnia  Patient Instructions: 1)  Please schedule a follow-up appointment in 6 months .  2)  BMP prior to visit, ICD-9: 790.29 3)  Hepatic Panel prior to visit ICD-9: 278.02 4)   Lipid panel prior to visit ICD-9 : 278.02 5)  TSH prior to visit ICD-9 : 272.0 6)  CBC w/ Diff prior to visit ICD-9 : 780.79 7)  HgBA1c prior to visit  ICD-9: 790.29 Prescriptions: DIAZEPAM 2 MG TABS (DIAZEPAM) 1 tab by mouth at bedtime as needed insomnia  #30 x 1   Entered and Authorized by:   Danise Edge MD   Signed by:   Danise Edge MD on 07/24/2010   Method used:   Print then Give to Patient   RxID:   [id number]

## 2011-01-29 NOTE — Miscellaneous (Signed)
Summary: [facility] previsit  Clinical Lists Changes  Medications: Added new medication of MOVIPREP 100 GM  SOLR (PEG-KCL-NACL-NASULF-NA ASC-C) As per prep instructions. - Signed Rx of MOVIPREP 100 GM  SOLR (PEG-KCL-NACL-NASULF-NA ASC-C) As per prep instructions.;  #1 x 0;  Signed;  Entered by: Karl Bales RN;  Authorized by: Meryl Dare MD [facility];  Method used: [id number] [address] [place], [place]  [address], Ph: [phone] or [phone], Fax: [phone]    Prescriptions: MOVIPREP 100 GM  SOLR (PEG-KCL-NACL-NASULF-NA ASC-C) As per prep instructions.  #1 x 0   Entered by:   Karl Bales RN   Authorized by:   Meryl Dare MD [facility]   Signed by:   Karl Bales RN on 11/01/2010   Method used:   Electronically to        [facility] ([facility]ail[facility]       [address]       [place], [place]  [address]       Ph: [phone] or [phone]       Fax: [phone]   RxID:   [id number]

## 2011-01-29 NOTE — Letter (Signed)
Summary: Patient Notice- Polyp Results  [facility]  [address] [place], [place] [address]   Phone: [phone]  Fax: [phone]        November 18, 2010 MRN: [id number]    [facility] [address] [place], [place]  [address]    Dear Morgan [name],  I am pleased to inform you that the colon polyp(s) removed during your recent colonoscopy was (were) found to be benign (no cancer detected) upon pathologic examination.  I recommend you have a repeat colonoscopy examination in 5 years to look for recurrent polyps, as having colon polyps increases your risk for having recurrent polyps or even colon cancer in the future.  Should you develop new or worsening symptoms of abdominal pain, bowel habit changes or bleeding from the rectum or bowels, please schedule an evaluation with either your primary care physician or with me.  Continue treatment plan as outlined the day of your exam.  Please call us if you are having persistent problems or have questions about your condition that have not been fully answered at this time.  Sincerely,  Morgan Dare MD [facility]  This letter has been electronically signed by your physician.  Appended Document: Patient Notice- Polyp Results Letter mailed

## 2011-01-29 NOTE — Progress Notes (Signed)
  Phone Note Outgoing Call Call back at Work Phone [phone]   Call placed by: Dietrich Pates, M.D. Call placed to: Patient Action Taken: Information Sent Summary of Call: Left msg on work machine.  Called to see how she was doing.  Myoview showed no ischemia.  Had discussed CPAP on recent visit.  Seeing if she was using it more, how breathing was.  Left number for call back.

## 2011-01-29 NOTE — Letter (Signed)
Summary: Colonoscopy Letter  [facility]  [address] [place], [place] [address]   Phone: [phone]  Fax: [phone]      January 11, 2010 MRN: [id number]   [facility] [address] [place], [place]  [address]   Dear Ms. Mehlhaff,   According to your medical record, it is time for you to schedule a Colonoscopy. The American Cancer Society recommends this procedure as a method to detect early colon cancer. Patients with a family history of colon cancer, or a personal history of colon polyps or inflammatory bowel disease are at increased risk.  This letter has beeen generated based on the recommendations made at the time of your procedure. If you feel that in your particular situation this may no longer apply, please contact our office.  Please call our office at [phone] to schedule this appointment or to update your records at your earliest convenience.  Thank you for cooperating with [place] to provide you with the very best care possible.   Sincerely,  Judie Petit T. Russella Dar, M.D.  [facility] [phone]

## 2011-01-29 NOTE — Letter (Signed)
Summary: [facility] Instructions  [facility]  [address] [place], [place] [address]   Phone: [phone]  Fax: [phone]       Morgan [name]    1951/03/20    MRN: [id number]        Procedure Day /Date:  Thursday 11/15/2010     Arrival Time: 7:30 am      Procedure Time: 8:30 am     Location of Procedure:                    _x _  [facility] (4th Floor)                        PREPARATION FOR COLONOSCOPY WITH MOVIPREP   Starting 5 days prior to your procedure Saturday 11/12 do not eat nuts, seeds, popcorn, corn, beans, peas,  salads, or any raw vegetables.  Do not take any fiber supplements (e.g. Metamucil, Citrucel, and Benefiber).  THE DAY BEFORE YOUR PROCEDURE         DATE: Wednesday 11/16  1.  Drink clear liquids the entire day-NO SOLID FOOD  2.  Do not drink anything colored red or purple.  Avoid juices with pulp.  No orange juice.  3.  Drink at least 64 oz. (8 glasses) of fluid/clear liquids during the day to prevent dehydration and help the prep work efficiently.  CLEAR LIQUIDS INCLUDE: Water Jello Ice Popsicles Tea (sugar ok, no milk/cream) Powdered fruit flavored drinks Coffee (sugar ok, no milk/cream) Gatorade Juice: apple, white grape, white cranberry  Lemonade Clear bullion, consomm, broth Carbonated beverages (any kind) Strained chicken noodle soup Hard Candy                             4.  In the morning, mix first dose of MoviPrep solution:    Empty 1 Pouch A and 1 Pouch B into the disposable container    Add lukewarm drinking water to the top line of the container. Mix to dissolve    Refrigerate (mixed solution should be used within 24 hrs)  5.  Begin drinking the prep at 5:00 p.m. The MoviPrep container is divided by 4 marks.   Every 15 minutes drink the solution down to the next mark (approximately 8 oz) until the full liter is complete.   6.  Follow completed prep with 16 oz of clear liquid of your choice  (Nothing red or purple).  Continue to drink clear liquids until bedtime.  7.  Before going to bed, mix second dose of MoviPrep solution:    Empty 1 Pouch A and 1 Pouch B into the disposable container    Add lukewarm drinking water to the top line of the container. Mix to dissolve    Refrigerate  THE DAY OF YOUR PROCEDURE      DATE: Thursday 11/17  Beginning at 3:30 a.m. (5 hours before procedure):         1. Every 15 minutes, drink the solution down to the next mark (approx 8 oz) until the full liter is complete.  2. Follow completed prep with 16 oz. of clear liquid of your choice.    3. You may drink clear liquids until 6:30 am (2 HOURS BEFORE PROCEDURE).   MEDICATION INSTRUCTIONS  Unless otherwise instructed, you should take regular prescription medications with a small sip of water   as early as possible the morning of  your procedure.        OTHER INSTRUCTIONS  You will need a responsible adult at least 62 years of age to accompany you and drive you home.   This person must remain in the waiting room during your procedure.  Wear loose fitting clothing that is easily removed.  Leave jewelry and other valuables at home.  However, you may wish to bring a book to read or  an iPod/MP3 player to listen to music as you wait for your procedure to start.  Remove all body piercing jewelry and leave at home.  Total time from sign-in until discharge is approximately 2-3 hours.  You should go home directly after your procedure and rest.  You can resume normal activities the  day after your procedure.  The day of your procedure you should not:   Drive   Make legal decisions   Operate machinery   Drink alcohol   Return to work  You will receive specific instructions about eating, activities and medications before you leave.    The above instructions have been reviewed and explained to me by   Karl Bales RN  November 01, 2010 2:15 PM    I fully understand  and can verbalize these instructions _____________________________ Date _________

## 2011-01-29 NOTE — Progress Notes (Signed)
Summary: Nuc Pre-Procedure  Phone Note Outgoing Call Call back at [facility] Phone [phone]   Call placed by: Antionette Char RN,  August 15, 2010 4:21 PM Call placed to: Patient Reason for Call: Confirm/change Appt Summary of Call: Reviewed information on Myoview Information Sheet (see scanned document for further details).  Spoke with patient's husband.

## 2011-01-29 NOTE — Assessment & Plan Note (Signed)
Summary: Cardiology Nuclear Testing  Nuclear Med Background Indications for Stress Test: Evaluation for Ischemia   History: Echo  History Comments: Echo- EF 55% Mild MR/CHF  Symptoms: Chest Tightness, DOE, Fatigue with Exertion    Nuclear Pre-Procedure Cardiac Risk Factors: Family History - CAD, Lipids Caffeine/Decaff Intake: None NPO After: 7:30 PM Lungs: clear IV 0.9% NS with Angio Cath: 24g     IV Site: Rt Hand IV Started by: Bonnita Levan RN Chest Size (in) 38     Cup Size B     Height (in): 63 Weight (lb): 152 BMI: 27.02  Nuclear Med Study 1 or 2 day study:  1 day     Stress Test Type:  Eugenie Birks Reading MD:  Arvilla Meres, MD     Referring MD:  P.Ross Resting Radionuclide:  Technetium 23m Tetrofosmin     Resting Radionuclide Dose:  10.5 mCi  Stress Radionuclide:  Technetium 77m Tetrofosmin     Stress Radionuclide Dose:  33 mCi   Stress Protocol   Lexiscan: 0.4 mg   Stress Test Technologist:  Milana Na EMT-P     Nuclear Technologist:  Domenic Polite CNMT  Rest Procedure  Myocardial perfusion imaging was performed at rest 45 minutes following the intravenous administration of Myoview Technetium 41m Tetrofosmin.  Stress Procedure  The patient received IV Lexiscan 0.4 mg over 15-seconds.  Myoview injected at 30-seconds.  There were no significant changes with infusion.  Quantitative spect images were obtained after a 45 minute delay.  QPS Raw Data Images:  Normal; no motion artifact; normal heart/lung ratio. Stress Images:  Mildly decreased uptake in the anterior wall. Rest Images:  Mildly decreased uptake in the anterior wall. Subtraction (SDS):  There is a fixed anterior defect that is most consistent with breast attenuation. Transient Ischemic Dilatation:  1.14  (Normal <1.22)  Lung/Heart Ratio:  .28  (Normal <0.45)  Quantitative Gated Spect Images QGS EDV:  68 ml QGS ESV:  22 ml QGS EF:  68 % QGS cine images:  Normal  Findings Normal nuclear  study      Overall Impression  Exercise Capacity: Lexiscan study with no exercise. ECG Impression: Baseline: NSR; No significant ST segment change with Lexiscan. Overall Impression: Probably normal stress nuclear study. Overall Impression Comments: There is a fixed anterior defect that is most consistent with breast attenuation.  Appended Document: Cardiology Nuclear Testing Probable normal perfusion with mild breast attenuation.  NO ischemia.  Appended Document: Cardiology Nuclear Testing LMOM at work on private voice mail with results of stress test.

## 2011-01-29 NOTE — Letter (Signed)
Summary: [facility]  [facility]   Imported By: Maryln Gottron 10/29/2010 14:55:40  _____________________________________________________________________  External Attachment:    Type:   Image     Comment:   External Document

## 2011-01-29 NOTE — Assessment & Plan Note (Signed)
Summary: consult for osa   Visit Type:  Initial Consult Copy to:  Dr. Reuel Derby Primary Provider/Referring Provider:  Stacie Glaze MD  CC:  Sleep consult. Epworth score is 9. Patient says she had a sleep study May 2010.[name]  History of Present Illness: The pt is a 60y/o female who I have been asked to see for a h/o mild osa and persistent sleep complaints.  The pt had a sleep study in 2010 which showed an AHI of 7/hr, but had RDI of 14/hr when accounting for RERA's.  This is c/w very mild osa.  She has tried to work on weight loss and positional therapy, but has not been successful.  Her husband feels her snoring has gotten louder, and is still having pauses in her breathing pattern during sleep.  She is going to bed 10-11pm, and arises at 6:45am to start her day.  She has fragmented sleep by her history, and does not feel rested the next am.  She describes definite sleep pressure during periods of inactivity while at work, especially in the afternoon.  She does admit to dozing while watching tv in the evening, and can fall asleep easily as a passenger in a car.  She denies any issues with driving.  Her epworth score today is 9.  Preventive Screening-Counseling & Management  Alcohol-Tobacco     Alcohol drinks/day: 0     Smoking Status: never  Current Medications (verified): 1)  Crestor 5 Mg  Tabs (Rosuvastatin Calcium) .... One By Mouth Q Hs 2)  Cymbalta 30 Mg  Cpep (Duloxetine Hcl) .... One By Mouth Daily 3)  Synthroid 88 Mcg  Tabs (Levothyroxine Sodium) .... One By Mouth Daily 30 Min Prior To Eating 4)  Nasonex 50 Mcg/act  Susp (Mometasone Furoate) .... Two Spray Q Nare Daily 5)  Estradiol 1 Mg  Tabs (Estradiol) .... Once Daily 6)  Prometrium 100 Mg  Caps (Progesterone Micronized) .... Once Daily 7)  Slow Fe 160 (50 Fe) Mg Cr-Tabs (Ferrous Sulfate Dried) .... As Needed  Allergies (verified): 1)  ! Sulfa  Past History:  Past Medical History: Hypothyroidism Osteoarthritis Allergic  rhinitis-seasonal DJD right knee Allergic rhinitis menopausal Colonic polyps, hx of mild osa  Past Surgical History: Reviewed history from 01/18/2008 and no changes required. Total Right knee sx-04/22/2007 Colonoscopy2/11/2003 Adhesion breaking 06/24/2007 Colon polypectomy Total knee replacement  Family History: Reviewed history from 07/14/2007 and no changes required. Fam hx High cholesterol father Heart attack father Brain Tumor father Family History of Arthritis  Social History: Reviewed history from 07/14/2007 and no changes required. Never Smoked Married Occupation:banker Alcohol drinks/day:  0  Review of Systems       The patient complains of shortness of breath with activity and joint stiffness or pain.    Vital Signs:  Patient profile:   60 year old female Height:      63 inches (160.02 cm) Weight:      154 pounds (70 kg) BMI:     27.38 O2 Sat:      94 % on Room air Temp:     98.4 degrees F (36.89 degrees C) oral Pulse rate:   77 / minute BP sitting:   132 / 86  (left arm) Cuff size:   regular  Vitals Entered By: Michel Bickers CMA (July 04, 2010 11:22 AM)  O2 Sat at Rest %:  94 O2 Flow:  Room air CC: Sleep consult. Epworth score is 9. Patient says she had a sleep study  May 2010. Comments Medications reviewed. Daytime phone verified,. Michel Bickers CMA  July 04, 2010 11:24 AM   Physical Exam  General:  ow female in nad Eyes:  PERRLA and EOMI.   Nose:  deviated septum to right with near obstruction, but narrowing on left as well. Mouth:  mild elongation of soft palate with normal uvula. Neck:  no jvd, tmg, LN Lungs:  clear to auscultation Heart:  rrr, no mrg Abdomen:  soft and nontender, bs+ Extremities:  no edema or cyanosis noted, pulses intact distally Neurologic:  alert and oriented, moves all 4.   Impression & Recommendations:  Problem # 1:  OBSTRUCTIVE SLEEP APNEA (ICD-327.23) the pt has a h/o mild osa from npsg in 2010, but now believes  she is having increasing symptoms that are impacting her QOL.  She does have abnormal upper airway anatomy that may respond to surgery, and she has osa mild enough that a dental appliance would treat adequately.  However, given the mild degree of osa on her 2010 study, I cannot say for certain this is responsible for her symptoms.  She may have environmental issues which are contributing, and does have chronic MSK pain that she does think plays a role.  To put this issue to rest, I would recommend a trial of cpap to see if she has signficant improvement.  If she does, she can choose to stay on cpap vs looking at some of the other alternatives.  If cpap trial doesn't help her, then we know her daytime symptoms are not due to her mild sleep disordered breathing.  The pt is agreeable to this approach.   Medications Added to Medication List This Visit: 1)  Slow Fe 160 (50 Fe) Mg Cr-tabs (Ferrous sulfate dried) .... As needed  Other Orders: Consultation Level IV ([address]) DME Referral (DME)  Patient Instructions: 1)  will start cpap for an 8 week trial to see if you have a good response.  Please call if tolerance issues. 2)  work on weight loss 3)  followup with me in 5 weeks, and bring machine to next visit.

## 2011-01-29 NOTE — Procedures (Signed)
Summary: Colonoscopy  Patient: [name] Note: All result statuses are Final unless otherwise noted.  Tests: (1) Colonoscopy (COL)   COL Colonoscopy           DONE     [facility]     [address]     [place], [place]  [address]           COLONOSCOPY PROCEDURE REPORT     PATIENT:  Morgan [name], Morgan [name]  MR#:  [id number]     BIRTHDATE:  1951/01/20, 59 yrs. old  GENDER:  female     ENDOSCOPIST:  Judie Petit T. Russella Dar, MD, [facility]           PROCEDURE DATE:  11/15/2010     PROCEDURE:  Colonoscopy with biopsy and snare polypectomy     ASA CLASS:  Class II     INDICATIONS:  1) Routine Risk Screening     MEDICATIONS:   Fentanyl 100 mcg IV, Versed 8 mg IV     DESCRIPTION OF PROCEDURE:   After the risks benefits and     alternatives of the procedure were thoroughly explained, informed     consent was obtained.  Digital rectal exam was performed and     revealed no abnormalities.   The LB [id number]-[id number] endoscope     was introduced through the anus and advanced to the cecum, which     was identified by both the appendix and ileocecal valve, without     limitations.  The quality of the prep was good, using MoviPrep.     The instrument was then slowly withdrawn as the colon was fully     examined.     <<PROCEDUREIMAGES>>     FINDINGS:  Two polyps were found in the transverse colon. They     were 3 - 4 mm in size. The polyps were removed using cold biopsy     forceps.  A sessile polyp was found in the descending colon. It     was 4 mm in size. The polyp was removed using cold biopsy forceps.     A sessile polyp was found in the sigmoid colon. It was 5 mm in     size. Polyp was snared without cautery. Retrieval was successful.     A sessile polyp was found in the rectum. It was 4 mm in size. The     polyp was removed using cold biopsy forceps.  This was otherwise a     normal examination of the colon. Retroflexed views in the rectum     revealed no abnormalities.  The time to cecum =  4.25   minutes.     The scope was then withdrawn (time =  12  min) from the patient     and the procedure completed.           COMPLICATIONS:  None           ENDOSCOPIC IMPRESSION:     1) 3 - 4 mm Two polyps in the transverse colon     2) 4 mm sessile polyp in the descending colon     3) 5 mm sessile polyp in the sigmoid colon     4) 4 mm sessile polyp in the rectum           RECOMMENDATIONS:     1) Await pathology results     2) If the polyps removed are adenomatous, repeat colonoscopy in     5 years. Otherwise continue  to follow colorectal cancer screening     guidelines for "routine risk" patients with colonoscopy in 10     years.           Venita Lick. Russella Dar, MD, [facility]           CC: Stacie Glaze, MD           n.     Rosalie DoctorVenita Lick. Stark at 11/15/2010 09:01 AM           Athena Masse, [id number]  Note: An exclamation mark (!) indicates a result that was not dispersed into the flowsheet. Document Creation Date: 11/15/2010 9:02 AM _______________________________________________________________________  (1) Order result status: Final Collection or observation date-time: 11/15/2010 08:56 Requested date-time:  Receipt date-time:  Reported date-time:  Referring Physician:   Ordering Physician: Claudette Head [phone]) Specimen Source:  Source: Launa Grill Order Number: [id number] Lab site:   Appended Document: Colonoscopy     Procedures Next Due Date:    Colonoscopy: 10/2015

## 2011-01-29 NOTE — Letter (Signed)
Summary: Pre Visit Letter Revised  [facility]  [address] [place], [place] [address]   Phone: [phone]  Fax: [phone]        09/05/2010 MRN: [id number] [facility] [address] [place], [place]  [address]             Procedure Date:  11-15-10   Welcome to the Gastroenterology Division at [facility].    You are scheduled to see a nurse for your pre-procedure visit on 11-01-10 at 2:00p.m. on the 3rd floor at [facility], [address]  We ask that you try to arrive at our office 15 minutes prior to your appointment time to allow for check-in.  Please take a minute to review the attached form.  If you answer "Yes" to one or more of the questions on the first page, we ask that you call the person listed at your earliest opportunity.  If you answer "No" to all of the questions, please complete the rest of the form and bring it to your appointment.    Your nurse visit will consist of discussing your medical and surgical history, your immediate family medical history, and your medications.   If you are unable to list all of your medications on the form, please bring the medication bottles to your appointment and we will list them.  We will need to be aware of both prescribed and over the counter drugs.  We will need to know exact dosage information as well.    Please be prepared to read and sign documents such as consent forms, a financial agreement, and acknowledgement forms.  If necessary, and with your consent, a friend or relative is welcome to sit-in on the nurse visit with you.  Please bring your insurance card so that we may make a copy of it.  If your insurance requires a referral to see a specialist, please bring your referral form from your primary care physician.  No co-pay is required for this nurse visit.     If you cannot keep your appointment, please call [phone] to cancel or reschedule prior to your appointment date.  This  allows [place] the opportunity to schedule an appointment for another patient in need of care.    Thank you for choosing [facility] for your medical needs.  We appreciate the opportunity to care for you.  Please visit [place] at our website  to learn more about our practice.  Sincerely, The Gastroenterology Division

## 2011-01-29 NOTE — Progress Notes (Signed)
Summary: Schedule Colonoscopy  Phone Note Outgoing Call Call back at [facility] Phone [phone] Call back at Work Phone [phone]   Call placed by: Harlow Mares CMA Duncan Dull),  August 22, 2010 12:54 PM Call placed to: Patient Summary of Call: Left message on patients machine to call back, on her work number, she is due for a colonoscopy Initial call taken by: Harlow Mares CMA Duncan Dull),  August 22, 2010 12:54 PM  Follow-up for Phone Call        per patietns husband patient is to ill to schedule at this time and she will call back to schedule.  Follow-up by: Harlow Mares CMA Duncan Dull),  August 30, 2010 2:36 PM     Appended Document: Schedule Colonoscopy pt. sch'd COL on 11-15-10 @ 8:30

## 2011-01-29 NOTE — Letter (Signed)
Summary: CMN/CPAP supplies/[facility]  CMN/CPAP supplies/[facility]   Imported By: Lester [place] 08/15/2010 09:33:51  _____________________________________________________________________  External Attachment:    Type:   Image     Comment:   External Document

## 2011-01-29 NOTE — Assessment & Plan Note (Signed)
Summary: fatigue, lethargy/dm   Vital Signs:  Patient profile:   60 year old female Height:      63 inches (160.02 cm) Weight:      155 pounds (70.45 kg) BMI:     27.56 O2 Sat:      93 % on Room air Temp:     98.3 degrees F (36.83 degrees C) oral Pulse rate:   80 / minute BP sitting:   118 / 88  (left arm) Cuff size:   regular  Vitals Entered By: Josph Macho RMA (June 15, 2010 10:20 AM)  O2 Flow:  Room air CC: Fatigue, weak X1 month/ Pt states she is concerned about thyroid levels/ Pt states she is no longer Repliva/  CF, Depression Is Patient Diabetic? No   History of Present Illness: Patient in c/o a couple weeks of increased fatigue and lethargy. Does not seem to correlate with how much sleep she gets. She is concerned about her thyroid function and is requesting a T3 be checked. She feels she has been struggling with worsening fatigue for at least the past year, in retrospect it began to worsen more notably over the past 3 months. In the past few weeks it has gotten bad enough that she feels she can fall asleep any time. While sitting at a stop light or any time of the day. She also notes some other symptoms on further questioning.  She is struggling with dyspnea especially on exertion, stairs are exhausting. She feels her whole body is week. She has been having trouble exercising due to knee pain, she has had the Right knee replaced and now the Left knee is bone and bone and she is considering replacement. She reports going to bed around 10:30p and getting  up around 7a but feels it is fitful sleep due to getting up 2-3 x a night to urinate. Her husband reports her snoring is worsening. She denies palpitations/wheezing. She denies chest pain but does note some chest pressure at times. She says it is subtle enough that she does not always note when it starts and stops and she has no associated symptoms such as n/v/diaphoresis/palp or SOB. Patient denies any depression or worsening  anxiety   Current Medications (verified): 1)  Crestor 5 Mg  Tabs (Rosuvastatin Calcium) .... One By Mouth Q Hs 2)  Cymbalta 30 Mg  Cpep (Duloxetine Hcl) .... One By Mouth Daily 3)  Synthroid 88 Mcg  Tabs (Levothyroxine Sodium) .... One By Mouth Daily 30 Min Prior To Eating 4)  Nasonex 50 Mcg/act  Susp (Mometasone Furoate) .... Two Spray Q Nare Daily 5)  Estradiol 1 Mg  Tabs (Estradiol) .... Once Daily 6)  Prometrium 100 Mg  Caps (Progesterone Micronized) .... Once Daily 7)  Repliva 21/7   Tabs (Feaspgl-Fefum-B12-Fa-C-Succ Ac) .[name].. 1 Once Daily 8)  Phentermine Hcl 37.5 Mg Caps (Phentermine Hcl) .... One By Mouth As Needed  Allergies (verified): 1)  ! Sulfa  Past History:  Past medical, surgical, family and social histories (including risk factors) reviewed, and no changes noted (except as noted below).  Past Medical History: Reviewed history from 01/18/2008 and no changes required. Hypothyroidism Osteoarthritis Allergic rhinitis-seasonal DJD right knee Allergic rhinitis menopausal Colonic polyps, hx of  Past Surgical History: Reviewed history from 01/18/2008 and no changes required. Total Right knee sx-04/22/2007 Colonoscopy2/11/2003 Adhesion breaking 06/24/2007 Colon polypectomy Total knee replacement  Family History: Reviewed history from 07/14/2007 and no changes required. Fam hx High cholesterol father Heart attack father Brain  Tumor father Family History of Arthritis  Social History: Reviewed history from 07/14/2007 and no changes required. Never Smoked Married Occupation:banker  Review of Systems      See HPI  Physical Exam  General:  Well-developed,well-nourished,in no acute distress; alert,appropriate and cooperative throughout examination Head:  Normocephalic and atraumatic without obvious abnormalities. Eyes:  No corneal or conjunctival inflammation noted. EOMI.  Ears:  External ear exam shows no significant lesions or deformities.  Otoscopic  examination reveals clear canals, tympanic membranes are intact bilaterally without bulging, retraction, inflammation or discharge. Hearing is grossly normal bilaterally. Nose:  External nasal examination shows no deformity or inflammation. Nasal mucosa are pink and moist without lesions or exudates. Mouth:  Oral mucosa and oropharynx without lesions or exudates.  Teeth in good repair. Neck:  No deformities, masses, or tenderness noted. Lungs:  Normal respiratory effort, chest expands symmetrically. Lungs are clear to auscultation, no crackles or wheezes. Heart:  Normal rate and regular rhythm. S1 and S2 normal without gallop, murmur, click, rub or other extra sounds. Abdomen:  Bowel sounds positive,abdomen soft and non-tender without masses, organomegaly or hernias noted. Extremities:  No clubbing, cyanosis, edema, or deformity noted with normal full range of motion of all joints.   Cervical Nodes:  No lymphadenopathy noted Psych:  Cognition and judgment appear intact. Alert and cooperative with normal attention span and concentration. No apparent delusions, illusions, hallucinations   Impression & Recommendations:  Problem # 1:  MALAISE AND FATIGUE (ICD-780.79)  Orders: TLB-CBC Platelet - w/Differential (85025-CBCD) TLB-TSH (Thyroid Stimulating Hormone) (84443-TSH) TLB-A1C / Hgb A1C (Glycohemoglobin) (83036-A1C) TLB-Magnesium (Mg) (83735-MG)  Patient with multiple confounding conditions. Will refer for pulmonology referral to evaluate worsening snoring and fatigue. Will check basic labs, encouraged exercise as tolerated, regular sleep  Problem # 2:  DYSPNEA (ICD-786.05)  Orders: Pulmonary Referral (Pulmonary) Echo Referral (Echo)  Maintain weight loss and referred for Echo.  Problem # 3:  OSTEOPENIA (ICD-733.90)  Orders: T-Vitamin D (25-Hydroxy) (40102-72536).  Consider repeat Bone Densitometry Start Citracal daily and Ca/Mag/Zn daily  Problem # 4:  HYPOTHYROIDISM  (ICD-244.9)  Her updated medication list for this problem includes:    Synthroid 88 Mcg Tabs (Levothyroxine sodium) ..... One by mouth daily 30 min prior to eating  Orders: TLB-T3, Free (Triiodothyronine) (84481-T3FREE) TLB-T4 (Thyrox), Free [phone]) Patient will follow up to review lab work in 1 month  Problem # 5:  HYPERLIPIDEMIA NEC/NOS (ICD-272.4)  Her updated medication list for this problem includes:    Crestor 5 Mg Tabs (Rosuvastatin calcium) ..... One by mouth q hs  Orders: TLB-Lipid Panel (80061-LIPID) TLB-Renal Function Panel (80069-RENAL) TLB-Hepatic/Liver Function Pnl (80076-HEPATIC) Avoid trans fats  Problem # 6:  ABNORMAL GLUCOSE NEC (ICD-790.29) Avoid simple carbs and repeat a hgba1c  Complete Medication List: 1)  Crestor 5 Mg Tabs (Rosuvastatin calcium) .... One by mouth q hs 2)  Cymbalta 30 Mg Cpep (Duloxetine hcl) .... One by mouth daily 3)  Synthroid 88 Mcg Tabs (Levothyroxine sodium) .... One by mouth daily 30 min prior to eating 4)  Nasonex 50 Mcg/act Susp (Mometasone furoate) .... Two spray q nare daily 5)  Estradiol 1 Mg Tabs (Estradiol) .... Once daily 6)  Prometrium 100 Mg Caps (Progesterone micronized) .... Once daily 7)  Repliva 21/7 Tabs (Feaspgl-fefum-b12-fa-c-succ ac) .[name].. 1 once daily 8)  Phentermine Hcl 37.5 Mg Caps (Phentermine hcl) .... One by mouth as needed  Patient Instructions: 1)  Please schedule a follow-up appointment in 1 Maintain 7-8 hours of sleep, try to get  30 minutes of exercise 3-4 x wk as tolerated. Start Citracal once daily, Ca/Mag/Zinc tab once daily. Proceed with Pulmonology evaluation of sleep apnea and worsening fatigue.  2)  Proceed with Echocardiogram to evaluate dyspnea and fatigue

## 2011-01-29 NOTE — Miscellaneous (Signed)
Summary: Immunization Entry   Immunization History:  Influenza Immunization History:    Influenza:  historical (11/10/2010)

## 2011-01-29 NOTE — Assessment & Plan Note (Signed)
Summary: Morgan [name]/Morgan [name]   Visit Type:  Initial Consult Referring Provider:  Dr. Reuel Derby Primary Provider:  Stacie Glaze MD   History of Present Illness: Patient is a 60 year old who was referred for evaluation of mitral regurgitation and Gr II diastolic [name] The pathe has been seen by Dr. Abner Greenspan.  Complaints of fatigue lead to a sleep study which was positive for sleep apnea.  She has not tolerated CPAP so far but just got a new mask that seems to be working better. She also underwent  echocardiagram that showed Morgan regurgitation and Gr II diastolic CHF.   The patient does note some  SOB with activities, occasional chest tightness.  Her activites are limited because of pains in her L knee.  Considering A L TKR.   Morgan [name]  No PND.  No significant edema.  Current Medications (verified): 1)  Crestor 5 Mg  Tabs (Rosuvastatin Calcium) .... One By Mouth Q Hs 2)  Cymbalta 30 Mg  Cpep (Duloxetine Hcl) .... One By Mouth Daily 3)  Synthroid 88 Mcg  Tabs (Levothyroxine Sodium) .... One By Mouth Daily 30 Min Prior To Eating 4)  Nasonex 50 Mcg/act  Susp (Mometasone Furoate) .... Two Spray Q Nare Daily 5)  Estradiol 1 Mg  Tabs (Estradiol) .... Once Daily 6)  Prometrium 100 Mg  Caps (Progesterone Micronized) .... Once Daily 7)  Slow Fe 160 (50 Fe) Mg Cr-Tabs (Ferrous Sulfate Dried) .... As Needed 8)  Diazepam 2 Mg Tabs (Diazepam) .Morgan [name].. 1 Tab By Mouth At Bedtime As Needed Insomnia  Allergies (verified): 1)  ! Sulfa  Past History:  Past Surgical History: Last updated: 01/18/2008 Total Right knee sx-04/22/2007 Colonoscopy2/11/2003 Adhesion breaking 06/24/2007 Colon polypectomy Total knee replacement  Family History: Last updated: 07/14/2007 Fam hx High cholesterol father Heart attack father Brain Tumor father Family History of Arthritis  Social History: Last updated: 07/14/2007 Never Smoked Married Occupation:banker  Past Medical  History: Hypothyroidism Osteoarthritis Allergic rhinitis-seasonal DJD right knee Allergic rhinitis menopausal Colonic polyps, hx of Morgan osa Mitral regurgitation Diastolic [name].  Review of Systems       All systems reviewed.  Negatvie to the above problem except as noted above.  Vital Signs:  Patient profile:   60 year old female Height:      63 inches Weight:      154 pounds BMI:     27.38 Pulse rate:   77 / minute BP sitting:   127 / 69  (left arm) Cuff size:   regular  Vitals Entered By: Laurance Flatten CMA (August 10, 2010 12:17 PM)  Physical Exam  Additional Exam:  Patient is in NAD HEENT:  Normocephalic, atraumatic. EOMI, PERRLA.  Neck: JVP is normal. No thyromegaly. No bruits.  Lungs: clear to auscultation. No rales no wheezes.  Heart: Regular rate and rhythm. Normal S1, S2. No S3.   No significant murmurs. PMI not displaced.  Abdomen:  Supple, nontender. Normal bowel sounds. No masses. No hepatomegaly.  Extremities:   Good distal pulses throughout. No lower extremity edema.  Musculoskeletal :moving all extremities.  Neuro:   alert and oriented x3.    EKG  Procedure date:  08/10/2010  Findings:      NSR.  68 bpm.    Impression & Recommendations:  Problem # 1:  DYSPNEA (ICD-786.05) I am not sure what the patient's dyspnea is due to  Question diasltolic [name].  Concern with intermittent chest heaviness. I would, with famil hsitory, recomm  an adenosine myoview to rule out inducible ischemia. Will review echo.  No change in regimen for now.  Problem # 2:  MITRAL REGURGITATION, Morgan (ICD-396.3) Morgan by echo.  Problem # 3:  MALAISE AND FATIGUE (ICD-780.79) May be due to sleep apnea.  I am not convinced due to diastolic [name].  i would recommend that she try CPAP for a while before any other intervention beyond stress test.  Other Orders: EKG w/ Interpretation (93000) Nuclear Stress Test (Nuc Stress Test)  Patient Instructions: 1)   Your physician has requested that you have an adenosine myoview.  For further information please visit [email or web address]  Please follow instruction sheet, as given. we will call you with results

## 2011-01-31 NOTE — Progress Notes (Signed)
Summary: cpap machine<<<OV scheduled  Phone Note Call from Patient Call back at Work Phone [phone]   Caller: Patient Call For: clance Reason for Call: Talk to Nurse Summary of Call: Patient calling saying she is wanting to return [facility] to [facility], [facility] told her she needs to get an okay from Dr. Shelle Iron.  Can we please get order to stop cpap and send to [facility]? Initial call taken by: Lehman Prom,  January 24, 2011 3:31 PM  Follow-up for Phone Call        Pt says she is just not able to wear cpap and wants to discontinue it and turn back in to [facility]. I told her that she would need OV to discuss this with Dr. Shelle Iron and she agreed. Pt is scheduled for Tues., 02/05/2011 @ 11:15am with KC. Follow-up by: Michel Bickers CMA,  January 24, 2011 5:20 PM

## 2011-01-31 NOTE — Assessment & Plan Note (Signed)
Summary: sinus issues/njr   Vital Signs:  Patient profile:   60 year old female Weight:      158 pounds Pulse rate:   78 / minute Pulse rhythm:   regular BP sitting:   120 / 82  (left arm) Cuff size:   regular  Vitals Entered By: Kyung Rudd, CMA (January 08, 2011 10:13 AM) CC: ??sinus inf   Primary Care Provider:  Stacie Glaze MD  CC:  ??sinus inf.  History of Present Illness: Patient presents to clinic as a workin for evaluation of cough and ST. Notes chronic intermittent symptoms of cough, ST and green nasal drainage since October. Took course of abx with some improvement but no resolution. Using nasonex once daily with h/o AR. Feels maxillary pressure especially with bending over. Attempting allegra d and mucinex without improvement.   Current Medications (verified): 1)  Crestor 5 Mg  Tabs (Rosuvastatin Calcium) .... One By Mouth Q Hs 2)  Cymbalta 30 Mg  Cpep (Duloxetine Hcl) .... One By Mouth Daily 3)  Synthroid 88 Mcg  Tabs (Levothyroxine Sodium) .... One By Mouth Daily 30 Min Prior To Eating 4)  Nasonex 50 Mcg/act  Susp (Mometasone Furoate) .... Two Spray Q Nare Daily 5)  Estradiol 1 Mg  Tabs (Estradiol) .... Once Daily 6)  Prometrium 100 Mg  Caps (Progesterone Micronized) .... Once Daily 7)  Slow Fe 160 (50 Fe) Mg Cr-Tabs (Ferrous Sulfate Dried) .... As Needed 8)  Diazepam 2 Mg Tabs (Diazepam) .[name].. 1 Tab By Mouth At Bedtime As Needed Insomnia 9)  Mucinex 600 Mg Xr12h-Tab (Guaifenesin) .[name].. 1 Tab By Mouth By Mouth Two Times A Day X 10 Day  Allergies (verified): 1)  ! Sulfa  Past History:  Past medical, surgical, family and social histories (including risk factors) reviewed, and no changes noted (except as noted below).  Past Medical History: Reviewed history from 08/10/2010 and no changes required. Hypothyroidism Osteoarthritis Allergic rhinitis-seasonal DJD right knee Allergic rhinitis menopausal Colonic polyps, hx of mild osa Mitral  regurgitation Diastolic dysfunction.  Past Surgical History: Reviewed history from 01/18/2008 and no changes required. Total Right knee sx-04/22/2007 Colonoscopy2/11/2003 Adhesion breaking 06/24/2007 Colon polypectomy Total knee replacement  Family History: Reviewed history from 07/14/2007 and no changes required. Fam hx High cholesterol father Heart attack father Brain Tumor father Family History of Arthritis  Social History: Reviewed history from 07/14/2007 and no changes required. Never Smoked Married Occupation:banker  Review of Systems General:  Denies chills, fever, and sweats. Eyes:  Denies discharge, eye irritation, and eye pain. ENT:  Complains of nasal congestion, postnasal drainage, sinus pressure, and sore throat; denies difficulty swallowing, ear discharge, and earache. Resp:  Complains of cough and sputum productive; denies chest discomfort, coughing up blood, shortness of breath, and wheezing. Derm:  Denies changes in color of skin and rash.  Physical Exam  General:  Well-developed,well-nourished,in no acute distress; alert,appropriate and cooperative throughout examination Head:  Normocephalic and atraumatic without obvious abnormalities. No apparent alopecia or balding. Eyes:  pupils equal, pupils round, corneas and lenses clear, and no injection.  +mild tenderness overlying maxillary sinuses Ears:  External ear exam shows no significant lesions or deformities.  Otoscopic examination reveals clear canals, tympanic membranes are intact bilaterally without bulging, retraction, inflammation or discharge. Hearing is grossly normal bilaterally. Nose:  External nasal examination shows no deformity or inflammation. Nasal mucosa are pink and moist without lesions or exudates. Mouth:  Oral mucosa and oropharynx without lesions or exudates.  Teeth in good repair.  Neck:  No deformities, masses, or tenderness noted. Lungs:  Normal respiratory effort, chest expands  symmetrically. Lungs are clear to auscultation, no crackles or wheezes. Heart:  Normal rate and regular rhythm. S1 and S2 normal without gallop, murmur, click, rub or other extra sounds. Skin:  turgor normal, color normal, and no rashes.     Impression & Recommendations:  Problem # 1:  SINUSITIS, ACUTE (ICD-461.9) Attempt augmentin x10d with medrol dosepak. Followup if no improvement or worsening.  The following medications were removed from the medication list:    Levaquin 500 Mg Tabs (Levofloxacin) .[name]... 1 tab by mouth daily Her updated medication list for this problem includes:    Nasonex 50 Mcg/act Susp (Mometasone furoate) .[name]..[name] Two spray q nare daily    Mucinex 600 Mg Xr12h-tab (Guaifenesin) .[name]... 1 tab by mouth by mouth two times a day x 10 day    Augmentin 875-125 Mg Tabs (Amoxicillin-pot clavulanate) ..... One by mouth bid  Complete Medication List: 1)  Crestor 5 Mg Tabs (Rosuvastatin calcium) .... One by mouth q hs 2)  Cymbalta 30 Mg Cpep (Duloxetine hcl) .... One by mouth daily 3)  Synthroid 88 Mcg Tabs (Levothyroxine sodium) .... One by mouth daily 30 min prior to eating 4)  Nasonex 50 Mcg/act Susp (Mometasone furoate) .... Two spray q nare daily 5)  Estradiol 1 Mg Tabs (Estradiol) .... Once daily 6)  Prometrium 100 Mg Caps (Progesterone micronized) .... Once daily 7)  Slow Fe 160 (50 Fe) Mg Cr-tabs (Ferrous sulfate dried) .... As needed 8)  Diazepam 2 Mg Tabs (Diazepam) .[name].. 1 tab by mouth at bedtime as needed insomnia 9)  Mucinex 600 Mg Xr12h-tab (Guaifenesin) .[name].. 1 tab by mouth by mouth two times a day x 10 day 10)  Augmentin 875-125 Mg Tabs (Amoxicillin-pot clavulanate) .... One by mouth bid 11)  Medrol (pak) 4 Mg Tabs (Methylprednisolone) .... As directed Prescriptions: MEDROL (PAK) 4 MG TABS (METHYLPREDNISOLONE) as directed  #1 x 0   Entered and Authorized by:   Edwyna Perfect MD   Signed by:   Edwyna Perfect MD on 01/08/2011   Method used:   Electronically to         [id number] (retail)       [address]       [place], [place]  [address]       Ph: [phone] or [phone]       Fax: [phone]   RxID:   [id number] AUGMENTIN 875-125 MG TABS (AMOXICILLIN-POT CLAVULANATE) one by mouth bid  #20 x 0   Entered and Authorized by:   Edwyna Perfect MD   Signed by:   Edwyna Perfect MD on 01/08/2011   Method used:   Electronically to        [id number] (retail)       [address]       [place], [place]  [address]       Ph: [phone] or [phone]       Fax: [phone]   RxID:   [id number]    Orders Added: 1)  Est. Patient Level III [57322]

## 2011-02-01 NOTE — Letter (Signed)
[facility]   Imported By: Maryln Gottron 03/06/2010 15:36:20  _____________________________________________________________________  External Attachment:    Type:   Image     Comment:   External Document

## 2011-02-05 ENCOUNTER — Encounter: Payer: Self-pay | Admitting: Pulmonary Disease

## 2011-02-05 ENCOUNTER — Ambulatory Visit (INDEPENDENT_AMBULATORY_CARE_PROVIDER_SITE_OTHER): Payer: BC Managed Care – PPO | Admitting: Pulmonary Disease

## 2011-02-05 DIAGNOSIS — G4733 Obstructive sleep apnea (adult) (pediatric): Secondary | ICD-10-CM

## 2011-02-20 NOTE — Assessment & Plan Note (Signed)
Summary: rov for management of osa   Copy to:  Dr. Reuel Derby Primary Provider/Referring Provider:  Stacie Glaze MD  CC:  Follow up appt on OSA.  Pt states she is not using her cpap machine- dislikes the mask on her face.  Pt states she is currently using a wedge pillow while sleeping at night and states her spouse states she is snoring less and wakes up feeling rested. Morgan [name]  History of Present Illness: the pt comes in today for f/u of her known osa.  She is not using her cpap machine, and feels she is intolerant of the device due to a mask on her face.  She has been using a wedge pillow to sleep at night, and tells me her husband has noted significant improvement of her snoring and breathing issues.  She states that she now awakens in the am's much more refreshed.  She has known mild osa.  Current Medications (verified): 1)  Crestor 5 Mg  Tabs (Rosuvastatin Calcium) .... One By Mouth Q Hs 2)  Cymbalta 30 Mg  Cpep (Duloxetine Hcl) .... One By Mouth Daily 3)  Synthroid 88 Mcg  Tabs (Levothyroxine Sodium) .... One By Mouth Daily 30 Min Prior To Eating 4)  Nasonex 50 Mcg/act  Susp (Mometasone Furoate) .... Two Spray Q Nare Daily 5)  Estradiol 1 Mg  Tabs (Estradiol) .... Take 1/2 Tab By Mouth Daily 6)  Prometrium 100 Mg  Caps (Progesterone Micronized) .... Once Daily 7)  Slow Fe 160 (50 Fe) Mg Cr-Tabs (Ferrous Sulfate Dried) .... As Needed 8)  Diazepam 2 Mg Tabs (Diazepam) .Morgan [name].. 1 Tab By Mouth At Bedtime As Needed Insomnia 9)  Mucinex 600 Mg Xr12h-Tab (Guaifenesin) .Morgan [name].. 1 Tab By Mouth By Mouth Two Times A Day X 10 Day  Allergies (verified): 1)  ! Sulfa  Review of Systems       The patient complains of productive cough and sore throat.  The patient denies shortness of breath with activity, shortness of breath at rest, non-productive cough, coughing up blood, chest pain, irregular heartbeats, acid heartburn, indigestion, loss of appetite, weight change, abdominal pain, difficulty swallowing,  tooth/dental problems, headaches, nasal congestion/difficulty breathing through nose, sneezing, itching, ear ache, anxiety, depression, hand/feet swelling, joint stiffness or pain, rash, change in color of mucus, and fever.    Vital Signs:  Patient profile:   60 year old female Height:      63 inches Weight:      158.13 pounds O2 Sat:      99 % on Room air Temp:     97.8 degrees F oral Pulse rate:   62 / minute BP sitting:   120 / 74  (left arm) Cuff size:   regular  Vitals Entered By: Arman Filter LPN (February 05, 2011 11:15 AM)  O2 Flow:  Room air CC: Follow up appt on OSA.  Pt states she is not using her cpap machine- dislikes the mask on her face.  Pt states she is currently using a wedge pillow while sleeping at night and states her spouse states she is snoring less and wakes up feeling rested.  Comments Medications reviewed with patient Arman Filter LPN  February 05, 2011 11:16 AM    Physical Exam  General:  ow female in nad Nose:  no skin breakdown or pressure necrosis from cpap mask Extremities:  no edema or cyanosis  Neurologic:  alert, does not appear sleepy, moves all 4.   Impression & Recommendations:  Problem # 1:  OBSTRUCTIVE SLEEP APNEA (ICD-327.23)  the pt has mild disease and has been intolerant of cpap.  Most recently has been trying positional therapy with a wedge, and is apparently not snoring or having apnea per husband.  She feels that she is sleeping fairly well, and adequate daytime alertness.  Since her sleep apnea was mild, and therefore not a CV risk for her, I am ok with conservative treatment while she continues to work on weight loss.  I have also given her info on a dental appliance should she feel her symptoms worsen.    Medications Added to Medication List This Visit: 1)  Estradiol 1 Mg Tabs (Estradiol) .... Take 1/2 tab by mouth daily  Other Orders: Est. Patient Level III (04540) DME Referral (DME)  Patient Instructions: 1)  will send  an order to [facility] to discontinue your cpap 2)  continue with positional therapy and weight loss for treatment of your sleep apnea.  If worsens, or if you feel you are not responding as well, would consider dental appliance 3)  Please schedule a follow-up appointment as needed.

## 2011-05-07 ENCOUNTER — Ambulatory Visit (INDEPENDENT_AMBULATORY_CARE_PROVIDER_SITE_OTHER): Payer: BC Managed Care – PPO | Admitting: Internal Medicine

## 2011-05-07 ENCOUNTER — Encounter: Payer: Self-pay | Admitting: Internal Medicine

## 2011-05-07 DIAGNOSIS — J069 Acute upper respiratory infection, unspecified: Secondary | ICD-10-CM

## 2011-05-07 MED ORDER — AZITHROMYCIN 250 MG PO TABS: ORAL_TABLET | ORAL | Status: AC

## 2011-05-07 NOTE — Progress Notes (Signed)
  Subjective:    Patient ID: Morgan [name], female    DOB: Feb 15, 1951, 60 y.o.   MRN: [id number]  HPI Pt presents to clinic for evaluation of nasal congestion. Notes 4 d h/o scratchy throat progressing to ST, nasal congestion/drainage, malaise, myalgias and headache. Denies fever, chills or cough. Taking otc cold medication. No other alleviating or exacerbating factors. No other complaints.  Reviewed pmh, medications and allergies    Review of Systems see HPI    Objective:   Physical Exam Physical Exam  [nursing notereviewed. Constitutional:  appears well-developed and well-nourished. No distress.  HENT:  Head: Normocephalic and atraumatic.  Right Ear: Tympanic membrane, external ear and ear canal normal.  Left Ear: Tympanic membrane, external ear and ear canal normal.  Nose: Nose normal.  Mouth/Throat: Oropharynx is clear and moist. No oropharyngeal exudate.  Eyes: Conjunctivae are normal. No scleral icterus.  Neck: Neck supple.  Cardiovascular: Normal rate, regular rhythm and normal heart sounds.  Exam reveals no gallop and no friction rub.   No murmur heard. Pulmonary/Chest: Effort normal and breath sounds normal. No respiratory distress.  no wheezes.  no rales.  Lymphadenopathy:     no cervical adenopathy.  Neurological:  alert.  Skin: Skin is warm and dry.  not diaphoretic.         Assessment & Plan:

## 2011-05-07 NOTE — Assessment & Plan Note (Signed)
Suspect viral etiology at this time. Continue otc symptomatic relief prn. Will be leaving the state for travel in several days. Given zpak prescription to hold. Begin abx if sx's do not improve after total over 8-10 days.

## 2011-05-14 NOTE — Discharge Summary (Signed)
Morgan [name], Morgan NO.:  [name]   MEDICAL RECORD NO.:  [id number]          PATIENT TYPE:  INP   LOCATION:  1504                         FACILITY:  [facility]   PHYSICIAN:  Morgan [name], M.D.    DATE OF BIRTH:  May 02, 1951   DATE OF ADMISSION:  04/22/2007  DATE OF DISCHARGE:  04/25/2007                               DISCHARGE SUMMARY   ADMISSION DIAGNOSES:  1. Osteoarthritis, right knee.  2. Mild depression.  3. Seasonal allergies.  4. Hypothyroidism.  5. Postmenopausal.  6. Hypercholesterolemia.   DISCHARGE DIAGNOSES:  1. Osteoarthritis, right knee, status post right total knee      arthroplasty.  2. Mild hyponatremia, improved.  3. Seasonal allergies.  4. Hypothyroidism.  5. Postmenopausal.  6. Hypercholesterolemia.  7. Mild depression.   PROCEDURE:  On 04/22/2007, right total knee surgery.   SURGEON:  Morgan [name], M.D.   ASSISTANT SURGEON:  Morgan L. Julien Girt, PA-C   ANESTHESIA:  Spinal anesthesia.   CONSULTS:  None.   BRIEF HISTORY:  The patient is a 60 year old female with a history of  osteoarthritis of the right knee with progressive worsening pain and  loss of function, failed nonoperative management, and now presents for  total knee.   LABORATORY DATA:  Preoperative CBC showed hemoglobin 12.8, hematocrit  37.8, white blood cell count 6.9.  Postoperative hemoglobin was 11.0  which drifted down and stabilized at 10.5 with a hematocrit of 30.8.  PT  and PTT preoperatively were 13.4 and 31.0, respectively, with INR 1.0.  Serial Protime as follows __________ 122.1 and 1.8.  Chem panel on  admission was all within normal limits.  __________  as follows:  Sodium  was 139 __________  141.  Preoperative UA was negative.  Blood type was  A-positive.  EKG on 04/15/2007 was normal sinus rhythm, normal EKG  confirmed by Dr. Tonny [name].   HOSPITAL COURSE:  The patient was admitted to [facility] and  tolerated the procedure  well __________  recovery room and orthopedic  floor.  She was started on area analgesic pain control.  __________ 24  hours postoperative IV antibiotics.  She was started on Coumadin for DVT  prophylaxis.  The patient did pretty well in the evening  of the day of  surgery and morning of day one seen in rounds by Dr. Lequita [name] and started  to get up out of bed.  Her hemoglobin was stable.  Her sodium was a  little low (down in the 130s).  We changed the fluids and decreased the  rate.  By day two, she was doing a little bit better and was able to get  some sleep.  The pain was under a little bit better control and we  weaned her to p.o. medications.  On dressing changes, her incision was  intact.  She started getting up and ambulating about 50 feet with her  therapy; she tolerated her therapy well.  She continued to progress well  and, by the following day, she was going about 120 feet and tolerating  her medications.  She was  ready to go home.   DISPOSITION:  1. The patient was discharged home on 04/25/2007 in improved      condition.  2. For discharge diagnoses, please see above.   DISCHARGE MEDICATIONS:  Percocet, Robaxin, Coumadin.   DISCHARGE DIET:  Low-cholesterol/heart diet.   FOLLOWUP:  Follow up in 2 weeks.   ACTIVITIES:  1. Weightbearing as tolerated, total knee protocol.  2. Home PT, home nursing.      Morgan [name], P.A.C.      Morgan [name], M.D.  Electronically Signed    ALP/MEDQ  D:  05/29/2007  T:  05/30/2007  Job:  [id number]   cc:   Morgan Glaze, MD  [address] [place]  [place] [address]   Morgan [name], M.D.  Fax: [phone]

## 2011-05-14 NOTE — Procedures (Signed)
NAME:  Morgan [name], Morgan [name] NO.:  [id number]   MEDICAL RECORD NO.:  [id number]          PATIENT TYPE:  OUT   LOCATION:  SLEEP CENTER                 FACILITY:  [facility]   PHYSICIAN:  Barbaraann Share, MD,FCCPDATE OF BIRTH:  1951-11-[birth year]   DATE OF STUDY:  05/26/2009                            NOCTURNAL POLYSOMNOGRAM   REFERRING PHYSICIAN:  Stacie Glaze, MD   LOCATION:  Sleep Lab.   INDICATION FOR STUDY:  Hypersomnia with sleep apnea.   EPWORTH SCORE:  11.   SLEEP ARCHITECTURE:  The patient had total sleep time of 365 minutes  with adequate slow wave sleep for age, but significantly decreased REM.  Sleep onset latency was mildly prolonged at 32 minutes, and REM onset  was very prolonged at 400 minutes.  Sleep efficiency was decreased at  78%.   RESPIRATORY DATA:  The patient was found to have 2 apneas and 40  hypopneas for an apnea/hypopnea index of 7 events per hour.  She was  also found to have 41 respiratory effort-related arousals, giving her a  respiratory disturbance index of 14 events per hour.  The events  occurred primarily in the supine position, and there was loud snoring  noted throughout.  The patient did not meet split night protocol  secondary to the majority of her events occurring after 1:00 a.m.   OXYGEN DATA:  The patient had O2 desaturation as low as 84% with her  obstructive events.   CARDIAC DATA:  Rare PAC and PVC, but no clinically significant  arrhythmias seen.   MOVEMENT/PARASOMNIA:  The patient had no significant periodic leg  movements or abnormal behaviors.   IMPRESSION/RECOMMENDATIONS:  1. Mild obstructive sleep apnea/hypopnea syndrome with an AHI of 7      events per hour, and a RDI of 14 events per hour.  The events      occurred primarily in the supine position and there was oxygen      desaturation as low as 84%.  The patient did not meet split night      protocol secondary to the majority of her events occurring after  1:00 a.m.  Treatment for this degree of sleep apnea can include a      trial of weight loss alone if applicable, upper airway surgery,      oral appliance, and also CPAP.  Positional therapy could also be      considered.  Clinical correlation is suggested.  2. Rare premature atrial contraction and premature ventricular      contraction, with no significant cardiac arrhythmias noted.       Barbaraann Share, MD,FCCP  Diplomate, American Board of Sleep  Medicine  Electronically Signed     [facility]/MEDQ  D:  06/06/2009 14:38:54  T:  06/07/2009 05:25:32  Job:  [id number]

## 2011-05-14 NOTE — Op Note (Signed)
NAME:  Morgan, [name] NO.:  [id number]   MEDICAL RECORD NO.:  [id number]          PATIENT TYPE:  AMB   LOCATION:  DAY                          FACILITY:  [facility]   PHYSICIAN:  Ollen Gross, M.D.    DATE OF BIRTH:  10-26-1951   DATE OF PROCEDURE:  DATE OF DISCHARGE:                               OPERATIVE REPORT   PREOPERATIVE DIAGNOSES:  Right knee arthrofibrosis.   POSTOPERATIVE DIAGNOSES:  Right knee arthrofibrosis.   PROCEDURE:  Right knee closed manipulation.   SURGEON:  Ollen Gross, M.D.   ASSISTANT:  None.   FINDINGS:  Premanipulation range of motion 15-90.  Postmanipulation range of motion 5-130.   COMPLICATIONS:  None.   CONDITION:  Stable.   BRIEF CLINICAL NOTE:  [name] is a 60 year old female who had a right  total knee arthroplasty performed on 04/22/2007.  She has done extensive  physical therapy but has not had improvements in range of motion over  the past 4 weeks or so.  She has had a disco fort secondary to the  stiffness.  She presents now for closed manipulation.   PROCEDURE IN DETAIL:  With the successful administration of general  anesthetic, exam under anesthesia is performed.  Range of motion is 15-  90.  I then placed my chest on her proximal tibia and flexed the knee.  Audible lysis of adhesions occurred.  I was easily able to get her  flexed to 130 degrees.  I then extended her knee and I was able to get  her within 5 degrees of full extension.  With pushing down on the knee,  I was able to get it fully extended.  I then mobilized the patella to  improve the patellar mobility.  She was then awake ed and transported to  recovery in stable condition.   ANESTHESIA:  General.   PROCEDURE IN DETAIL:      Ollen Gross, M.D.  Electronically Signed    FA/MEDQ  D:  06/24/2007  T:  06/25/2007  Job:  [id number]

## 2011-05-17 NOTE — H&P (Signed)
NAME:  YANIA, BOGIE NO.:  [id number]   MEDICAL RECORD NO.:  [id number]          PATIENT TYPE:  INP   LOCATION:  NA                           FACILITY:  [facility]   PHYSICIAN:  Ollen Gross, M.D.    DATE OF BIRTH:  01/18/1951   DATE OF ADMISSION:  04/22/2007  DATE OF DISCHARGE:                              HISTORY & PHYSICAL   DATE OF OFFICE VISIT HISTORY AND PHYSICAL:  April 16, 2007.   CHIEF COMPLAINT:  Right knee pain.   HISTORY OF PRESENT ILLNESS:  The patient is a 60 year old female who has  a long history relating to her right knee.  It has progressively gotten  worse over this past year.  She had increasing difficulty attempting to  do activities.  It is to a point where it is hurting all the time.  She  has difficulty getting around.  She has undergone cortisone injections  by Dr. Lovell Sheehan and also had a series of Synvisc, which did not provide  any relief.  She is seen in consultation by Dr. Lequita Halt and she is found  to have bone-on-bone changes in the knee, significant arthritis.  It is  felt she has reached the point where she could benefit undergoing  surgical intervention.  Risks and benefits discussed.  The patient  subsequently admitted to the hospital.   ALLERGIES:  SULFA DRUGS.   CURRENT MEDICATIONS:  Synthroid, Crestor, Cymbalta, estradiol,  Prometrium, Nasonex, Claritin, and every other week allergy shots.   PAST MEDICAL HISTORY:  1. Seasonal allergies.  2. Mild depression.  3. Hypothyroidism.  4. Postmenopausal.  5. Hypercholesterolemia.   PAST SURGICAL HISTORY:  She has had a colonoscopy with polypectomy.   SOCIAL HISTORY:  Married.  She works in Photographer.  Nonsmoker, only  occasional intake of alcohol.  One child.  Her husband will be assisting  with care after surgery.  One-story home, no steps.   FAMILY HISTORY:  Father with history of high cholesterol, heart attack  and arthritis, also brain tumor.   REVIEW OF SYSTEMS:   GENERAL:  No fevers, chills, night sweats.  NEUROLOGIC:  No seizures, syncope or paralysis.  RESPIRATORY:  No  shortness of breath, productive cough or hemoptysis.  CARDIOVASCULAR:  No chest pain, angina or orthopnea.  GASTROINTESTINAL: No nausea,  vomiting, diarrhea or constipation.  GENITOURINARY:  No dysuria,  hematuria or discharge.  MUSCULOSKELETAL:  Right knee.   PHYSICAL EXAMINATION:  VITAL SIGNS:  Pulse 68, respirations 12, blood  pressure 124/68.  GENERAL:  A 60 year old white female, well-nourished, well-developed, no  acute distress.  She is alert and oriented and cooperative, pleasant.  Good historian.  HEENT: Normocephalic, atraumatic.  Pupils equal, round and reactive.  Oropharynx clear.  EOMs intact.  NECK:  Supple.  CHEST:  Clear.  HEART:  A faint early systolic ejection murmur versus a prolonged S1  over the aortic point.  S1, S2 noted.  ABDOMEN:  Soft, nontender.  Bowel sounds present.  RECTAL, BREASTS, GENITALIA:  Not done.  Not pertinent to present  illness.  EXTREMITIES:  Right knee:  The right knee shows a slight varus  malalignment deformity, range of motion 5-120.  Moderate crepitus.  Tender more medial than lateral.   IMPRESSION:  1. Osteoarthritis, right knee.  2. Mild depression.  3. Seasonal allergies.  4. Hypothyroidism.  5. Postmenopausal.  6. Hypercholesterolemia.   IMPRESSION AND PLAN:  The patient will be admitted to [facility] to undergo a right total knee replacement arthroplasty.  Surgery will be performed by Dr. Ollen Gross.  Her medical physician  is Dr. Darryll Capers.  He will be notified of the room on admission and  be consulted if needed for medical assistance with the patient  throughout the hospital course.      Alexzandrew L. Perkins, P.A.C.      Ollen Gross, M.D.  Electronically Signed    ALP/MEDQ  D:  04/21/2007  T:  04/22/2007  Job:  [id number]   cc:   Stacie Glaze, MD  [address] [place]  [place] [address]

## 2011-05-17 NOTE — Op Note (Signed)
NAME:  Morgan [name], Morgan [name] NO.:  [id number]   MEDICAL RECORD NO.:  [id number]          PATIENT TYPE:  INP   LOCATION:  0005                         FACILITY:  [facility]   PHYSICIAN:  Ollen Gross, M.D.    DATE OF BIRTH:  1951-03-16   DATE OF PROCEDURE:  04/22/2007  DATE OF DISCHARGE:                               OPERATIVE REPORT   PREOPERATIVE DIAGNOSIS:  Osteoarthritis, right knee.   POSTOPERATIVE DIAGNOSIS:  Osteoarthritis, right knee.   PROCEDURE:  Right total knee arthroplasty.   SURGEON:  Dr. Lequita Halt   ASSISTANT:  Avel Peace, PA-C   ANESTHESIA:  Spinal.   ESTIMATED BLOOD LOSS:  Minimal.   DRAIN:  None.   TOURNIQUET TIME:  40 minutes at 300 mmHg.   COMPLICATIONS:  None.   CONDITION:  Stable to recovery.   BRIEF CLINICAL NOTE:  Morgan [name] is a 60 year old female who has end-  stage arthritis of the right knee with progressively worsening pain and  dysfunction.  She has failed nonoperative management and presents for  total knee arthroplasty.   PROCEDURE IN DETAIL:  After the successful administration of spinal  anesthetic, a tourniquet is placed high on her right thigh and right  lower extremity prepped and draped in the usual sterile fashion.  Extremity is wrapped in Esmarch, knee flexed, tourniquet inflated to 300  mmHg.  A midline incision is made with a 10 blade through the  subcutaneous tissue to the level of the extensor mechanism.  A fresh  blade is used to make a medial parapatellar arthrotomy.  The soft tissue  over the proximal and medial tibia is subperiosteally elevated to the  joint line with a knife and into the semimembranosus bursa with a Cobb  elevator.  Soft tissue over the proximal and lateral tibia is elevated  with attention being paid to avoiding the patella tendon on tibial  tubercle.  The patella is subluxed laterally, knee flexed 90 degrees,  ACL and PCL removed.  Drill is used to create a starting hole in the  distal  femur, and the canal is thoroughly irrigated.  A 5-degree right  valgus alignment guide is placed and referencing off the posterior  condyles, rotation is marked, and the block is pinned to remove 10 mm  off the distal femur.  Distal femoral resection is made with an  oscillating saw.  A sizing block is placed, and size 2 is most  appropriate.  Rotation is marked off the epicondylar AXIS, and the size  2 cutting block placed.  The anterior, posterior, and chamfer cuts are  then made.   The tibia is subluxed forward, and the menisci are removed.  Extramedullary tibial guide is placed, referencing proximally at the  medial aspect of the tibial tubercle and distally along the second  metatarsal axis and tibial crest.  The block is pinned to remove 10 mm  off the nondeficient lateral side.  Tibial resection is made with an  oscillating saw.  A sizing block is placed, and size 2 is most  appropriate.  The proximal tibia is then prepared  with the modular drill  and keel punch for a size 2.  Femoral preparation is completed with the  intercondylar cut.   Size 2 mobile bearing tibial trial, size 2 posterior stabilized femoral  trial, and a 10 mm posterior stabilized rotating platform insert trial  are placed.  With the 10, full extension is achieved with excellent  varus and valgus balance throughout full range of motion.  The patella  is then everted and thickness measured to be 20 mm.  Free-hand resection  is taken to 12 mm; a 32 template is placed; lug holes are drilled; trial  patellar is placed, and it tracks normally.  Osteophytes are removed off  the posterior femur with the trial in place.  All trials are removed,  then the cut bone surfaces are prepared with pulsatile lavage.  Cement  is mixed and once ready for implantation, the size 2 mobile bearing  tibial tray, size 2 posterior stabilized femur, and 32 patella are  cemented into place, and the patella is held with a clamp.  Trial  10 mm  insert is placed, knee held in full extension, and all extruded cement  removed.  Once the cement is fully hardened, then the joint is  thoroughly irrigated and the FloSeal sprayed onto the posterior capsule.  The 10 mm posterior stabilized rotating platform insert is placed into  the tibial tray.  The FloSeal was then placed into the medial and  lateral gutters and suprapatellar area.  The tourniquet is released for  a total time of 40 minutes.  There is minimal bleeding.  After a minute  or two, we then thoroughly irrigated the joint with pulsatile lavage.  The arthrotomy is then closed with interrupted #1 PDS.  Flexion against  gravity is 135 degrees.  Subcu is closed with interrupted 2-0 Vicryl,  subcuticular with running 4-0 Monocryl.  The incision is cleaned and  dried and Steri-Strips and a bulky sterile dressing applied.  She is  then placed into a knee immobilizer, awakened, and transported to  recovery in stable condition.      Ollen Gross, M.D.  Electronically Signed     FA/MEDQ  D:  04/22/2007  T:  04/22/2007  Job:  [id number]

## 2011-06-14 ENCOUNTER — Other Ambulatory Visit: Payer: Self-pay | Admitting: *Deleted

## 2011-06-14 MED ORDER — LEVOTHYROXINE SODIUM 88 MCG PO TABS: 88.0000 ug | ORAL_TABLET | Freq: Every day | ORAL | Status: DC

## 2011-07-10 ENCOUNTER — Ambulatory Visit (INDEPENDENT_AMBULATORY_CARE_PROVIDER_SITE_OTHER): Payer: BC Managed Care – PPO | Admitting: Internal Medicine

## 2011-07-10 VITALS — BP 136/80 | HR 72 | Temp 98.2°F | Resp 16 | Ht 63.0 in | Wt 156.0 lb

## 2011-07-10 DIAGNOSIS — E785 Hyperlipidemia, unspecified: Secondary | ICD-10-CM

## 2011-07-10 DIAGNOSIS — E039 Hypothyroidism, unspecified: Secondary | ICD-10-CM

## 2011-07-10 DIAGNOSIS — R5381 Other malaise: Secondary | ICD-10-CM

## 2011-07-10 DIAGNOSIS — R42 Dizziness and giddiness: Secondary | ICD-10-CM

## 2011-07-10 DIAGNOSIS — H8110 Benign paroxysmal vertigo, unspecified ear: Secondary | ICD-10-CM

## 2011-07-10 DIAGNOSIS — R5383 Other fatigue: Secondary | ICD-10-CM

## 2011-07-10 LAB — CBC WITH DIFFERENTIAL/PLATELET
Basophils Relative: 0.3 % (ref 0.0–3.0)
Eosinophils Absolute: 0.4 10*3/uL (ref 0.0–0.7)
Eosinophils Relative: 4.6 % (ref 0.0–5.0)
HCT: 38.9 % (ref 36.0–46.0)
Lymphs Abs: 2.6 10*3/uL (ref 0.7–4.0)
MCHC: 33.9 g/dL (ref 30.0–36.0)
MCV: 93 fl (ref 78.0–100.0)
Monocytes Absolute: 0.5 10*3/uL (ref 0.1–1.0)
Neutrophils Relative %: 60.7 % (ref 43.0–77.0)
Platelets: 281 10*3/uL (ref 150.0–400.0)

## 2011-07-10 LAB — TSH: TSH: 0.25 u[IU]/mL — ABNORMAL LOW (ref 0.35–5.50)

## 2011-07-10 NOTE — Progress Notes (Signed)
Subjective:    Patient ID: Morgan [name], female    DOB: [birth year], 60 y.o.   MRN: [id number]  HPI Increased stressors Patient presents with episodic dizziness primarily in the early a.m. He states that this is sometimes positional but sometimes not she is concerned that this may be her medications.  We discussed the relationship between dizziness and the use of statins and possibly could be a cause also she is treated for thyroid disease we want to make sure that she is not over medicated.  T3 and T4 as well as a CBC differential will be drawn today and consideration of medications for symptomatic relief including Antivert were discussed with the patient.  The patient desired weight and her blood work is back before trying Antivert   Review of Systems  Constitutional: Negative for activity change, appetite change and fatigue.  HENT: Negative for ear pain, congestion, neck pain, postnasal drip and sinus pressure.   Eyes: Negative for redness and visual disturbance.  Respiratory: Negative for cough, shortness of breath and wheezing.   Gastrointestinal: Negative for abdominal pain and abdominal distention.  Genitourinary: Negative for dysuria, frequency and menstrual problem.  Musculoskeletal: Negative for myalgias, joint swelling and arthralgias.  Skin: Negative for rash and wound.  Neurological: Positive for weakness and light-headedness. Negative for dizziness and headaches.  Hematological: Negative for adenopathy. Does not bruise/bleed easily.  Psychiatric/Behavioral: Negative for sleep disturbance and decreased concentration.   Past Medical History  Diagnosis Date  . Thyroid disease   . Arthritis   . Allergy   . History of colonic polyps   . OSA (obstructive sleep apnea)   . Mitral regurgitation   . Diastolic dysfunction    Past Surgical History  Procedure Date  . 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 Polypectomy     colon    reports that she has never smoked.  She does not have any smokeless tobacco history on file. She reports that she drinks alcohol. Her drug history not on file. family history includes Heart attack in her father and Hyperlipidemia in her father. Allergies  Allergen Reactions  . Sulfonamide Derivatives     REACTION: Rash       Objective:   Physical Exam  Nursing note and vitals reviewed. Constitutional: She is oriented to person, place, and time. She appears well-developed and well-nourished. No distress.  HENT:  Head: Normocephalic and atraumatic.  Right Ear: External ear normal.  Left Ear: External ear normal.  Nose: Nose normal.  Mouth/Throat: Oropharynx is clear and moist.  Eyes: Conjunctivae and EOM are normal. Pupils are equal, round, and reactive to light.       No nystagmus was detected  Neck: Normal range of motion. Neck supple. No JVD present. No tracheal deviation present. No thyromegaly present.  Cardiovascular: Normal rate, regular rhythm, normal heart sounds and intact distal pulses.   No murmur heard. Pulmonary/Chest: Effort normal and breath sounds normal. She has no wheezes. She exhibits no tenderness.  Abdominal: Soft. Bowel sounds are normal.  Musculoskeletal: Normal range of motion. She exhibits no edema and no tenderness.  Lymphadenopathy:    She has no cervical adenopathy.  Neurological: She is alert and oriented to person, place, and time. She has normal reflexes. No cranial nerve deficit.  Skin: Skin is warm and dry. She is not diaphoretic.  Psychiatric: She has a normal mood and affect. Her behavior is normal.   Blood pressure 136/80, pulse 72, temperature 98.2 F (36.8 C),  resp. rate 16, height 5\' 3"  (1.6 m), weight 156 lb (70.761 kg). No orthostatic changes       Assessment & Plan:  I will check a CBC differential to make sure that she is not anemic we'll also check thyroid to make sure she's not overmedicated on her thyroid replacement I suspect the dizziness is more likely benign  vertigo however one must consider the Crestor as a possible etiology we will obtain the blood work if the blood work does not reveal an etiology then I would recommend antevert and consideration for discontinuation of Crestor

## 2011-07-18 ENCOUNTER — Telehealth: Payer: Self-pay

## 2011-07-18 NOTE — Telephone Encounter (Signed)
Pt requesting lab results. Pt advised that labs have not been reviewed and she will receive a call when they are reviewed by the doctor

## 2011-07-22 ENCOUNTER — Encounter: Payer: Self-pay | Admitting: Internal Medicine

## 2011-07-22 ENCOUNTER — Other Ambulatory Visit: Payer: Self-pay | Admitting: *Deleted

## 2011-07-22 ENCOUNTER — Telehealth: Payer: Self-pay

## 2011-07-22 DIAGNOSIS — H8110 Benign paroxysmal vertigo, unspecified ear: Secondary | ICD-10-CM

## 2011-07-22 DIAGNOSIS — R42 Dizziness and giddiness: Secondary | ICD-10-CM | POA: Insufficient documentation

## 2011-07-22 MED ORDER — MECLIZINE HCL 25 MG PO TABS: 25.0000 mg | ORAL_TABLET | Freq: Three times a day (TID) | ORAL | Status: DC | PRN

## 2011-07-22 NOTE — Telephone Encounter (Signed)
Pt would like lab results. States that she was told that she would hear something about them within a couple of days after having them drawn. C/O dizziness and not feeling well. Pt requesting a call back today. Please advise

## 2011-07-22 NOTE — Telephone Encounter (Signed)
All labs wnl and pt informed requesting med for dizziness- sent in by dr Lovell Sheehan

## 2011-07-26 ENCOUNTER — Other Ambulatory Visit (INDEPENDENT_AMBULATORY_CARE_PROVIDER_SITE_OTHER): Payer: BC Managed Care – PPO

## 2011-07-26 DIAGNOSIS — Z0000 Encounter for general adult medical examination without abnormal findings: Secondary | ICD-10-CM

## 2011-07-26 LAB — BASIC METABOLIC PANEL
BUN: 17 mg/dL (ref 6–23)
Calcium: 8.8 mg/dL (ref 8.4–10.5)
GFR: 105.6 mL/min (ref >60.00)
Glucose, Bld: 108 mg/dL — ABNORMAL HIGH (ref 70–99)
Potassium: 3.8 mEq/L (ref 3.5–5.1)
Sodium: 141 mEq/L (ref 135–145)

## 2011-07-26 LAB — HEPATIC FUNCTION PANEL
ALT: 19 U/L (ref 0–35)
AST: 24 U/L (ref 0–37)
Alkaline Phosphatase: 64 U/L (ref 39–117)
Bilirubin, Direct: 0 mg/dL (ref 0.0–0.3)
Total Bilirubin: 0.3 mg/dL (ref 0.3–1.2)

## 2011-07-26 LAB — CBC WITH DIFFERENTIAL/PLATELET
Basophils Absolute: 0 10*3/uL (ref 0.0–0.1)
Eosinophils Relative: 6.6 % — ABNORMAL HIGH (ref 0.0–5.0)
HCT: 38.6 % (ref 36.0–46.0)
Lymphocytes Relative: 32.4 % (ref 12.0–46.0)
Monocytes Relative: 5.6 % (ref 3.0–12.0)
Neutrophils Relative %: 54.9 % (ref 43.0–77.0)
Platelets: 263 10*3/uL (ref 150.0–400.0)
RDW: 13.4 % (ref 11.5–14.6)
WBC: 6.2 10*3/uL (ref 4.5–10.5)

## 2011-07-26 LAB — LIPID PANEL
Cholesterol: 168 mg/dL (ref 0–200)
LDL Cholesterol: 92 mg/dL (ref 0–99)
Total CHOL/HDL Ratio: 3
VLDL: 18.4 mg/dL (ref 0.0–40.0)

## 2011-07-26 LAB — POCT URINALYSIS DIPSTICK
Bilirubin, UA: NEGATIVE
Ketones, UA: NEGATIVE
Leukocytes, UA: NEGATIVE
pH, UA: 7

## 2011-07-26 LAB — TSH: TSH: 0.36 u[IU]/mL (ref 0.35–5.50)

## 2011-07-30 ENCOUNTER — Telehealth: Payer: Self-pay | Admitting: *Deleted

## 2011-07-30 NOTE — Telephone Encounter (Signed)
Opened in error

## 2011-07-31 ENCOUNTER — Encounter: Payer: Self-pay | Admitting: Internal Medicine

## 2011-08-02 ENCOUNTER — Encounter: Payer: Self-pay | Admitting: Internal Medicine

## 2011-08-02 ENCOUNTER — Ambulatory Visit (INDEPENDENT_AMBULATORY_CARE_PROVIDER_SITE_OTHER): Payer: BC Managed Care – PPO | Admitting: Internal Medicine

## 2011-08-02 VITALS — BP 132/80 | HR 76 | Temp 98.2°F | Resp 16 | Ht 63.0 in | Wt 160.0 lb

## 2011-08-02 DIAGNOSIS — Z0000 Encounter for general adult medical examination without abnormal findings: Secondary | ICD-10-CM

## 2011-08-02 DIAGNOSIS — Z23 Encounter for immunization: Secondary | ICD-10-CM

## 2011-08-02 DIAGNOSIS — H8109 Meniere's disease, unspecified ear: Secondary | ICD-10-CM

## 2011-08-02 MED ORDER — TRIAMTERENE-HCTZ 37.5-25 MG PO TABS: 0.5000 | ORAL_TABLET | Freq: Every day | ORAL | Status: DC

## 2011-08-02 NOTE — Progress Notes (Signed)
Subjective:    Patient ID: Morgan [name], female    DOB: [birth year], 60 y.o.   MRN: [id number]  HPI Patient presents for annual examination.  She had a recent episode of dizziness for which Antivert was called in with the presumptive diagnosis of benign positional vertigo Antivert did not help she began taking antihistamines at night and this has helped somewhat.  She does have a history of the past of seeing an ear nose and throat physician who diagnosed Mnire's disease but she does not have symptoms that fit chronic and he has disease in that she has not had these symptoms on an ongoing basis.     Review of Systems  Constitutional: Negative for activity change, appetite change and fatigue.  HENT: Negative for ear pain, congestion, neck pain, postnasal drip and sinus pressure.   Eyes: Negative for redness and visual disturbance.  Respiratory: Negative for cough, shortness of breath and wheezing.   Gastrointestinal: Negative for abdominal pain and abdominal distention.  Genitourinary: Negative for dysuria, frequency and menstrual problem.  Musculoskeletal: Negative for myalgias, joint swelling and arthralgias.  Skin: Negative for rash and wound.  Neurological: Negative for dizziness, weakness and headaches.  Hematological: Negative for adenopathy. Does not bruise/bleed easily.  Psychiatric/Behavioral: Negative for sleep disturbance and decreased concentration.   Past Medical History  Diagnosis Date  . Thyroid disease   . Arthritis   . Allergy   . History of colonic polyps   . OSA (obstructive sleep apnea)   . Mitral regurgitation   . Diastolic dysfunction    Past Surgical History  Procedure Date  . 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 Polypectomy     colon    reports that she has never smoked. She does not have any smokeless tobacco history on file. She reports that she drinks alcohol. She reports that she does not use illicit drugs. family history includes  Arthritis in her other; Heart attack in her father; Heart disease in her father; and Hyperlipidemia in her father. Allergies  Allergen Reactions  . Sulfonamide Derivatives     REACTION: Rash        Objective:   Physical Exam  Constitutional: She is oriented to person, place, and time. She appears well-developed and well-nourished. No distress.  HENT:  Head: Normocephalic and atraumatic.  Right Ear: External ear normal.  Left Ear: External ear normal.  Nose: Nose normal.  Mouth/Throat: Oropharynx is clear and moist.  Eyes: Conjunctivae and EOM are normal. Pupils are equal, round, and reactive to light.  Neck: Normal range of motion. Neck supple. No JVD present. No tracheal deviation present. No thyromegaly present.  Cardiovascular: Normal rate, regular rhythm, normal heart sounds and intact distal pulses.   No murmur heard. Pulmonary/Chest: Effort normal and breath sounds normal. She has no wheezes. She exhibits no tenderness.  Abdominal: Soft. Bowel sounds are normal.  Musculoskeletal: Normal range of motion. She exhibits no edema and no tenderness.  Lymphadenopathy:    She has no cervical adenopathy.  Neurological: She is alert and oriented to person, place, and time. She has normal reflexes. No cranial nerve deficit.  Skin: Skin is warm and dry. She is not diaphoretic.  Psychiatric: She has a normal mood and affect. Her behavior is normal.          Assessment & Plan:  Patient has symptoms that are consistent with intermittent Mnire's disease do not believe she should be on a diuretic chronically at this time she  has good blood pressure control with her current medications however if the symptoms persist then adding Maxzide half tablet daily would be my first step.  We are monitoring the patient's thyroid today Her T3 free and T3 are within the normal remnant limits and her TSH is well suppressed  Dr Shelle Iron is seeing her for sleep apnea She is stable on cymbalta  This  is a routine physical examination for this healthy  Female. Reviewed all health maintenance protocols including mammography colonoscopy bone density and reviewed appropriate screening labs. Her immunization history was reviewed as well as her current medications and allergies refills of her chronic medications were given and the plan for yearly health maintenance was discussed all orders and referrals were made as appropriate.

## 2011-09-09 ENCOUNTER — Ambulatory Visit (INDEPENDENT_AMBULATORY_CARE_PROVIDER_SITE_OTHER): Payer: BC Managed Care – PPO | Admitting: Family Medicine

## 2011-09-09 ENCOUNTER — Encounter: Payer: Self-pay | Admitting: Family Medicine

## 2011-09-09 VITALS — BP 130/74 | Temp 98.2°F | Wt 162.0 lb

## 2011-09-09 DIAGNOSIS — B9789 Other viral agents as the cause of diseases classified elsewhere: Secondary | ICD-10-CM

## 2011-09-09 DIAGNOSIS — B349 Viral infection, unspecified: Secondary | ICD-10-CM

## 2011-09-09 NOTE — Patient Instructions (Signed)
Viral Syndrome   You or your child has Viral Syndrome. It is the most common infection causing “colds” and infections in the nose, throat, sinuses, and breathing tubes. Sometimes the infection causes nausea, vomiting, or diarrhea. The germ that causes the infection is a virus. No antibiotic or other medicine will kill it. There are medicines that you can take to make you or your child more comfortable.   HOME CARE INSTRUCTIONS   Rest in bed until you start to feel better.   If you have diarrhea or vomiting, eat small amounts of crackers and toast. Soup is helpful.   For children, DO NOT give aspirin or medicine that contains aspirin.   Only take over-the-counter or prescription medicines for pain, discomfort, or fever as directed by your caregiver.   SEEK MEDICAL CARE IF:   You or your child has not improved within one week.   You or your child has pain that is not at least partially relieved by over-the-counter medicine.   Thick, colored mucus or blood is coughed up.   Discharge from the nose becomes thick yellow or green.   Diarrhea or vomiting gets worse.   There is any major change in your or your child’s condition.   You or your child develops a skin rash, stiff neck, severe headache, or are unable to hold down food or fluid.   You or your child has an oral temperature above 101, not controlled by medicine.   Your baby is older than 3 months with a rectal temperature of 102º F (38.9º C) or higher.   Your baby is 3 months old or younger with a rectal temperature of 100.4º F (38º C) or higher.   Document Released: 12/01/2006 Document Re-Released: 06/05/2010   [employer]® Patient Information ©2011 [employer].

## 2011-09-09 NOTE — Progress Notes (Signed)
  Subjective:    Patient ID: Morgan [name], female    DOB: 20-Apr-1951, 60 y.o.   MRN: [id number]  HPI Acute illness. Onset last week while visiting family members in [place] state. Exposed to grandchildren who are sick. Patient had onset last Thursday a sore throat. Followed Friday by nasal congestion. By Saturday she had cough, body aches, and chills. Her bodyaches and chills have improved at this time. No documented fever past couple days. Cough productive of green sputum. Sore throat better at this time. Denies nausea or vomiting. No significant headaches. Taking over-the-counter medications such as Mucinex and Advil. Nonsmoker   Review of Systems  Constitutional: Positive for fatigue.  HENT: Positive for congestion and postnasal drip. Negative for ear pain.   Respiratory: Positive for cough.        Objective:   Physical Exam  Constitutional: She appears well-developed and well-nourished.  HENT:  Right Ear: External ear normal.  Left Ear: External ear normal.  Mouth/Throat: Oropharynx is clear and moist.  Neck: Neck supple.  Cardiovascular: Normal rate and regular rhythm.   Pulmonary/Chest: Effort normal and breath sounds normal. No respiratory distress. She has no wheezes. She has no rales.  Lymphadenopathy:    She has no cervical adenopathy.          Assessment & Plan:  Probable viral syndrome. Continue Advil and Mucinex. No indication for antibiotics at this time. Follow up promptly for any recurrent fever or worsening symptoms

## 2011-10-16 LAB — HEMOGLOBIN AND HEMATOCRIT, BLOOD
HCT: 36.3
Hemoglobin: 12.6

## 2011-10-16 LAB — PROTIME-INR: Prothrombin Time: 13.1

## 2011-11-08 ENCOUNTER — Ambulatory Visit: Payer: BC Managed Care – PPO | Admitting: Internal Medicine

## 2011-11-14 ENCOUNTER — Other Ambulatory Visit: Payer: Self-pay | Admitting: Obstetrics and Gynecology

## 2011-11-14 DIAGNOSIS — Z1231 Encounter for screening mammogram for malignant neoplasm of breast: Secondary | ICD-10-CM

## 2011-11-29 ENCOUNTER — Ambulatory Visit (INDEPENDENT_AMBULATORY_CARE_PROVIDER_SITE_OTHER): Payer: BC Managed Care – PPO | Admitting: Internal Medicine

## 2011-11-29 ENCOUNTER — Other Ambulatory Visit: Payer: Self-pay | Admitting: *Deleted

## 2011-11-29 DIAGNOSIS — Z23 Encounter for immunization: Secondary | ICD-10-CM

## 2011-11-29 MED ORDER — MOMETASONE FUROATE 50 MCG/ACT NA SUSP: 2.0000 | Freq: Every day | NASAL | Status: DC

## 2011-12-16 ENCOUNTER — Ambulatory Visit (HOSPITAL_COMMUNITY)
Admission: RE | Admit: 2011-12-16 | Discharge: 2011-12-16 | Disposition: A | Discharge status: Home / Self Care | Payer: BC Managed Care – PPO | Source: Ambulatory Visit | Attending: Obstetrics and Gynecology | Admitting: Obstetrics and Gynecology

## 2011-12-16 DIAGNOSIS — Z1231 Encounter for screening mammogram for malignant neoplasm of breast: Secondary | ICD-10-CM

## 2011-12-27 ENCOUNTER — Other Ambulatory Visit: Payer: Self-pay | Admitting: *Deleted

## 2011-12-27 ENCOUNTER — Telehealth: Payer: Self-pay | Admitting: *Deleted

## 2011-12-27 MED ORDER — ATORVASTATIN CALCIUM 10 MG PO TABS: 10.0000 mg | ORAL_TABLET | Freq: Every day | ORAL | Status: DC

## 2011-12-27 NOTE — Telephone Encounter (Signed)
Done

## 2012-01-26 ENCOUNTER — Ambulatory Visit (INDEPENDENT_AMBULATORY_CARE_PROVIDER_SITE_OTHER): Payer: BC Managed Care – PPO

## 2012-01-26 DIAGNOSIS — J0100 Acute maxillary sinusitis, unspecified: Secondary | ICD-10-CM

## 2012-01-30 ENCOUNTER — Telehealth: Payer: Self-pay

## 2012-01-30 NOTE — Telephone Encounter (Signed)
.  [facility] PT STATES SHE WAS GIVEN CIFDINIR FROM DR Neva Seat AND IT DOESN'T AGREE WITH HER. MAKES HER STOMACH HURT AND SHE DOESN'T LIKE IT WOULD LIKE TO HAVE SOMETHING ELSE CALLED IN. PLEASE CALL [phone] AND SHE USES [facility] ON [address]

## 2012-01-30 NOTE — Telephone Encounter (Signed)
That is a common side effect of any antibiotic. Is she taking with food? Can take OTC bean-o to help with gas.  Morgan [name]

## 2012-01-30 NOTE — Telephone Encounter (Signed)
Pt states that she is having diarrhea and gas since starting to take cefidnir.  Can we call her in something else. Please advise

## 2012-01-31 ENCOUNTER — Encounter: Payer: Self-pay | Admitting: Family

## 2012-01-31 ENCOUNTER — Ambulatory Visit (INDEPENDENT_AMBULATORY_CARE_PROVIDER_SITE_OTHER): Payer: BC Managed Care – PPO | Admitting: Family

## 2012-01-31 VITALS — BP 120/80 | Temp 97.6°F | Wt 160.0 lb

## 2012-01-31 DIAGNOSIS — J019 Acute sinusitis, unspecified: Secondary | ICD-10-CM

## 2012-01-31 DIAGNOSIS — IMO0001 Reserved for inherently not codable concepts without codable children: Secondary | ICD-10-CM

## 2012-01-31 DIAGNOSIS — M791 Myalgia, unspecified site: Secondary | ICD-10-CM

## 2012-01-31 MED ORDER — FEXOFENADINE-PSEUDOEPHED ER 180-240 MG PO TB24: 1.0000 | ORAL_TABLET | Freq: Every day | ORAL | Status: DC

## 2012-01-31 MED ORDER — AZITHROMYCIN 250 MG PO TABS: ORAL_TABLET | ORAL | Status: AC

## 2012-01-31 NOTE — Progress Notes (Signed)
Subjective:    Patient ID: Morgan [name], female    DOB: February 13, 1951, 61 y.o.   MRN: [id number]  HPI Comments: C/o sinus and nasal drainage, muscle aches, and intermittent pressure headaches started 2 weeks ago when visiting daughter who had a sick 2yo. Nasal drainage thick, dark green. Had chills and fever first couple days. Was seen in urgent care clinic on Sunday and prescribed cefdinir. Only took three days dosage, caused gas and gi upset with diarrhea. Took OTC mucinex, sudafed, afrin nasal spray with no relief.   Sinusitis Associated symptoms include congestion, sinus pressure and a sore throat. Pertinent negatives include no ear pain or sneezing.      Review of Systems  HENT: Positive for congestion, sore throat, rhinorrhea, postnasal drip and sinus pressure. Negative for hearing loss, ear pain, nosebleeds, facial swelling, sneezing, trouble swallowing, neck stiffness, voice change, tinnitus and ear discharge.   Eyes: Negative.   Respiratory: Negative.    Past Medical History  Diagnosis Date  . Thyroid disease   . Arthritis   . Allergy   . History of colonic polyps   . OSA (obstructive sleep apnea)   . Mitral regurgitation   . Diastolic dysfunction     History   Social History  . Marital Status: Married    Spouse Name: N/A    Number of Children: N/A  . Years of Education: N/A   Occupational History  . Not on file.   Social History Main Topics  . Smoking status: Never Smoker   . Smokeless tobacco: Not on file  . Alcohol Use: Yes  . Drug Use: No  . Sexually Active: Yes   Other Topics Concern  . Not on file   Social History Narrative  . No narrative on file    Past Surgical History  Procedure Date  . 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 Polypectomy     colon    Family History  Problem Relation Age of Onset  . Heart attack Father   . Hyperlipidemia Father   . Heart disease Father   . Arthritis Other     Allergies  Allergen Reactions  .  Sulfonamide Derivatives     REACTION: Rash    Current Outpatient Prescriptions on File Prior to Visit  Medication Sig Dispense Refill  . atorvastatin (LIPITOR) 10 MG tablet Take 1 tablet (10 mg total) by mouth daily.  90 tablet  3  . DULoxetine (CYMBALTA) 30 MG capsule Take 30 mg by mouth daily.        [name] levothyroxine (SYNTHROID, LEVOTHROID) 88 MCG tablet Take 1 tablet (88 mcg total) by mouth daily.  90 tablet  3  . mometasone (NASONEX) 50 MCG/ACT nasal spray Place 2 sprays into the nose daily.  51 g  3  . meclizine (ANTIVERT) 25 MG tablet Take 1 tablet (25 mg total) by mouth 3 (three) times daily as needed for dizziness or nausea.  30 tablet  1  . triamterene-hydrochlorothiazide (MAXZIDE-25) 37.5-25 MG per tablet Take 0.5 tablets by mouth daily.  30 tablet  11    BP 120/80  Temp(Src) 97.6 F (36.4 C) (Oral)  Wt 160 lb (72.576 kg)chart     Objective:   Physical Exam  Constitutional: She is oriented to person, place, and time. She appears well-developed and well-nourished. No distress.  HENT:  Head: Normocephalic and atraumatic.  Right Ear: External ear normal.  Left Ear: External ear normal.  Nose: Nose normal.  Mouth/Throat: Oropharynx  is clear and moist. No oropharyngeal exudate.  Cardiovascular: Normal rate, regular rhythm, normal heart sounds and intact distal pulses.   Pulmonary/Chest: Effort normal and breath sounds normal. No respiratory distress. She has no wheezes. She has no rales. She exhibits no tenderness.  Neurological: She is alert and oriented to person, place, and time.  Skin: Skin is warm and dry. She is not diaphoretic.         Assessment & Plan:  Assessment: Sinusitis, myalgias  Plan: Amoxicillin, allegra-D, increase po fluid intake and rest. Teaching provided with handouts on acute sinusitis. Patient probably office if symptoms worsen or persist, recheck as scheduled and when necessary. In and

## 2012-01-31 NOTE — Telephone Encounter (Signed)
Pt husband answered phone and stated that the pt will not be taking the call because pt went to primary

## 2012-01-31 NOTE — Patient Instructions (Signed)

## 2012-01-31 NOTE — Telephone Encounter (Signed)
Pt calling again about getting a different medication for her sinus infecftion

## 2012-01-31 NOTE — Telephone Encounter (Signed)
It took [place] too long to respond according to the patient so she went to her PCP to get help. She was frustrated but I did explain that we appreciate the feedback and we are on a new computer system and doing everything we can to take care of our patients. Pt said she doesn't need anything further from [place].

## 2012-03-09 ENCOUNTER — Other Ambulatory Visit: Payer: Self-pay | Admitting: *Deleted

## 2012-03-09 MED ORDER — DULOXETINE HCL 30 MG PO CPEP: 30.0000 mg | ORAL_CAPSULE | Freq: Every day | ORAL | Status: DC

## 2012-05-12 ENCOUNTER — Other Ambulatory Visit: Payer: Self-pay | Admitting: *Deleted

## 2012-05-12 MED ORDER — LEVOTHYROXINE SODIUM 88 MCG PO TABS: 88.0000 ug | ORAL_TABLET | Freq: Every day | ORAL | Status: DC

## 2012-07-17 ENCOUNTER — Other Ambulatory Visit: Payer: Self-pay | Admitting: Surgery

## 2012-08-11 ENCOUNTER — Other Ambulatory Visit (INDEPENDENT_AMBULATORY_CARE_PROVIDER_SITE_OTHER): Payer: BC Managed Care – PPO

## 2012-08-11 DIAGNOSIS — Z0000 Encounter for general adult medical examination without abnormal findings: Secondary | ICD-10-CM

## 2012-08-11 LAB — CBC WITH DIFFERENTIAL/PLATELET
Basophils Relative: 0.4 % (ref 0.0–3.0)
Eosinophils Absolute: 0.6 10*3/uL (ref 0.0–0.7)
HCT: 41.3 % (ref 36.0–46.0)
Hemoglobin: 13.3 g/dL (ref 12.0–15.0)
Lymphocytes Relative: 31.2 % (ref 12.0–46.0)
MCHC: 32.3 g/dL (ref 30.0–36.0)
MCV: 93.2 fl (ref 78.0–100.0)
Monocytes Absolute: 0.4 10*3/uL (ref 0.1–1.0)
Neutro Abs: 4.5 10*3/uL (ref 1.4–7.7)
RBC: 4.43 Mil/uL (ref 3.87–5.11)

## 2012-08-11 LAB — HEPATIC FUNCTION PANEL
Albumin: 4.5 g/dL (ref 3.5–5.2)
Alkaline Phosphatase: 82 U/L (ref 39–117)
Total Protein: 7.9 g/dL (ref 6.0–8.3)

## 2012-08-11 LAB — POCT URINALYSIS DIPSTICK
Leukocytes, UA: NEGATIVE
pH, UA: 5.5

## 2012-08-11 LAB — BASIC METABOLIC PANEL
BUN: 18 mg/dL (ref 6–23)
GFR: 111.54 mL/min (ref >60.00)
Potassium: 3.8 mEq/L (ref 3.5–5.1)
Sodium: 138 mEq/L (ref 135–145)

## 2012-08-11 LAB — LIPID PANEL: HDL: 70.1 mg/dL (ref >39.00)

## 2012-08-11 LAB — TSH: TSH: 0.7 u[IU]/mL (ref 0.35–5.50)

## 2012-08-18 ENCOUNTER — Encounter: Payer: Self-pay | Admitting: Internal Medicine

## 2012-08-18 ENCOUNTER — Ambulatory Visit (INDEPENDENT_AMBULATORY_CARE_PROVIDER_SITE_OTHER): Payer: BC Managed Care – PPO | Admitting: Internal Medicine

## 2012-08-18 VITALS — BP 130/80 | HR 72 | Temp 98.2°F | Resp 16 | Ht 63.0 in | Wt 155.0 lb

## 2012-08-18 DIAGNOSIS — Z0000 Encounter for general adult medical examination without abnormal findings: Secondary | ICD-10-CM

## 2012-08-18 DIAGNOSIS — L2089 Other atopic dermatitis: Secondary | ICD-10-CM

## 2012-08-18 DIAGNOSIS — L209 Atopic dermatitis, unspecified: Secondary | ICD-10-CM

## 2012-08-18 NOTE — Patient Instructions (Signed)
Hold your statin for 30 days if the rash completely resolves then resume the statin if the rash begins to return stop the statin and notify my office

## 2012-08-18 NOTE — Progress Notes (Signed)
Subjective:    Patient ID: Morgan [name], female    DOB: 23-Mar-1951, 61 y.o.   MRN: [id number]  HPI Patient presents for yearly examination.  She is followed for hypothyroidism moderate obesity history of obstructive sleep apnea and hyperlipidemia.  She has a new onset rash on her back neck and chest she was seen by a dermatologist and a biopsy was obtained...she has been diagnosed with an atopic type dermatitis that she has been treated with a topical steroid which has caused some resolution of the rash but there is minimal rash persisting   Review of Systems  Constitutional: Negative for activity change, appetite change and fatigue.  HENT: Negative for ear pain, congestion, neck pain, postnasal drip and sinus pressure.   Eyes: Negative for redness and visual disturbance.  Respiratory: Negative for cough, shortness of breath and wheezing.   Gastrointestinal: Negative for abdominal pain and abdominal distention.  Genitourinary: Negative for dysuria, frequency and menstrual problem.  Musculoskeletal: Negative for myalgias, joint swelling and arthralgias.  Skin: Negative for rash and wound.  Neurological: Negative for dizziness, weakness and headaches.  Hematological: Negative for adenopathy. Does not bruise/bleed easily.  Psychiatric/Behavioral: Negative for disturbed wake/sleep cycle and decreased concentration.   Past Medical History  Diagnosis Date  . Thyroid disease   . Arthritis   . Allergy   . History of colonic polyps   . OSA (obstructive sleep apnea)   . Mitral regurgitation   . Diastolic dysfunction     History   Social History  . Marital Status: Married    Spouse Name: N/A    Number of Children: N/A  . Years of Education: N/A   Occupational History  . Not on file.   Social History Main Topics  . Smoking status: Never Smoker   . Smokeless tobacco: Not on file  . Alcohol Use: Yes  . Drug Use: No  . Sexually Active: Yes   Other Topics Concern  . Not on  file   Social History Narrative  . No narrative on file    Past Surgical History  Procedure Date  . 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 Polypectomy     colon    Family History  Problem Relation Age of Onset  . Heart attack Father   . Hyperlipidemia Father   . Heart disease Father   . Arthritis Other     Allergies  Allergen Reactions  . Sulfonamide Derivatives     REACTION: Rash    Current Outpatient Prescriptions on File Prior to Visit  Medication Sig Dispense Refill  . atorvastatin (LIPITOR) 10 MG tablet Take 1 tablet (10 mg total) by mouth daily.  90 tablet  3  . DULoxetine (CYMBALTA) 30 MG capsule Take 1 capsule (30 mg total) by mouth daily.  90 capsule  3  . levothyroxine (SYNTHROID, LEVOTHROID) 88 MCG tablet Take 1 tablet (88 mcg total) by mouth daily.  90 tablet  3  . mometasone (NASONEX) 50 MCG/ACT nasal spray Place 2 sprays into the nose daily.  51 g  3  . DISCONTD: meclizine (ANTIVERT) 25 MG tablet Take 1 tablet (25 mg total) by mouth 3 (three) times daily as needed for dizziness or nausea.  30 tablet  1    BP 130/80  Pulse 72  Temp 98.2 F (36.8 C)  Resp 16  Ht 5\' 3"  (1.6 m)  Wt 155 lb (70.308 kg)  BMI 27.46 kg/m2        Objective:  Physical Exam  Nursing note and vitals reviewed. Constitutional: She is oriented to person, place, and time. She appears well-developed and well-nourished. No distress.  HENT:  Head: Normocephalic and atraumatic.  Right Ear: External ear normal.  Left Ear: External ear normal.  Nose: Nose normal.  Mouth/Throat: Oropharynx is clear and moist.  Eyes: Conjunctivae and EOM are normal. Pupils are equal, round, and reactive to light.  Neck: Normal range of motion. Neck supple. No JVD present. No tracheal deviation present. No thyromegaly present.  Cardiovascular: Normal rate, regular rhythm, normal heart sounds and intact distal pulses.   No murmur heard. Pulmonary/Chest: Effort normal and breath sounds  normal. She has no wheezes. She exhibits no tenderness.  Abdominal: Soft. Bowel sounds are normal.  Musculoskeletal: Normal range of motion. She exhibits no edema and no tenderness.  Lymphadenopathy:    She has no cervical adenopathy.  Neurological: She is alert and oriented to person, place, and time. She has normal reflexes. No cranial nerve deficit.  Skin: Skin is warm and dry. She is not diaphoretic.  Psychiatric: She has a normal mood and affect. Her behavior is normal.          Assessment & Plan:   This is a routine physical examination for this healthy  Female. Reviewed all health maintenance protocols including mammography colonoscopy bone density and reviewed appropriate screening labs. Her immunization history was reviewed as well as her current medications and allergies refills of her chronic medications were given and the plan for yearly health maintenance was discussed all orders and referrals were made as appropriate.   Atopic dermatitis on topical steroid possibly a drug reaction we'll consider holding her statin for 30 days then resuming it at the rash resumes with the resumption of a statin we will discontinue the statin.  Monitor TSH stable monitor lipids improved and stable on current medication discussed diet reviewed gluten-free diet with patient

## 2012-10-22 ENCOUNTER — Other Ambulatory Visit: Payer: Self-pay | Admitting: Orthopedic Surgery

## 2012-10-22 MED ORDER — BUPIVACAINE 0.25 % ON-Q PUMP SINGLE CATH 300ML: 300.0000 mL | INJECTION | Status: DC

## 2012-10-22 MED ORDER — DEXAMETHASONE SODIUM PHOSPHATE 10 MG/ML IJ SOLN: 10.0000 mg | Freq: Once | INTRAMUSCULAR | Status: DC

## 2012-10-22 NOTE — Progress Notes (Signed)
Preoperative surgical orders have been place into the Epic hospital system for Morgan [name] on 10/22/2012, 1:45 PM  by Patrica Duel for surgery on 11/30/12.  Preop Total Knee orders including Bupivacaine On-Q pump, IV Tylenol, and IV Decadron as long as there are no contraindications to the above medications. Avel Peace, PA-C

## 2012-10-23 ENCOUNTER — Ambulatory Visit (INDEPENDENT_AMBULATORY_CARE_PROVIDER_SITE_OTHER): Payer: BC Managed Care – PPO | Admitting: Internal Medicine

## 2012-10-23 DIAGNOSIS — Z23 Encounter for immunization: Secondary | ICD-10-CM

## 2012-11-08 ENCOUNTER — Other Ambulatory Visit: Payer: Self-pay | Admitting: *Deleted

## 2012-11-08 ENCOUNTER — Ambulatory Visit (INDEPENDENT_AMBULATORY_CARE_PROVIDER_SITE_OTHER): Payer: BC Managed Care – PPO | Admitting: Emergency Medicine

## 2012-11-08 VITALS — BP 122/80 | HR 77 | Temp 98.3°F | Resp 18 | Ht 64.0 in | Wt 156.8 lb

## 2012-11-08 DIAGNOSIS — B001 Herpesviral vesicular dermatitis: Secondary | ICD-10-CM

## 2012-11-08 DIAGNOSIS — B009 Herpesviral infection, unspecified: Secondary | ICD-10-CM

## 2012-11-08 DIAGNOSIS — J209 Acute bronchitis, unspecified: Secondary | ICD-10-CM

## 2012-11-08 DIAGNOSIS — J018 Other acute sinusitis: Secondary | ICD-10-CM

## 2012-11-08 MED ORDER — AMOXICILLIN-POT CLAVULANATE 875-125 MG PO TABS: 1.0000 | ORAL_TABLET | Freq: Two times a day (BID) | ORAL | Status: DC

## 2012-11-08 MED ORDER — VALACYCLOVIR HCL 1 G PO TABS: 2000.0000 mg | ORAL_TABLET | Freq: Two times a day (BID) | ORAL | Status: DC

## 2012-11-08 NOTE — Progress Notes (Signed)
[facility] [address] [place] [address] [phone]  Date:  11/08/2012   Name:  Morgan [name]   DOB:  1951-07-29   MRN:  [id number]  PCP:  Carrie Mew, MD    Chief Complaint: Sinusitis, Cough and Mouth Lesions   History of Present Illness:  Morgan [name] is a 61 y.o. very pleasant female patient who presents with the following:  Ill for past week.  Initially had sore throat and now has a green post nasal drainage and a cough productive purulent sputum.  Nasal congestion.  Fever in past week.  Unknown max temperature following chills.  Seems to be worsening over the past week while she was visiting her daughter in [place] state.  No shortness of breath or wheezing. No nausea or vomiting.  No stool change.  Patient Active Problem List  Diagnosis  . HYPOTHYROIDISM  . HYPERLIPIDEMIA NEC/NOS  . OVERWEIGHT  . OBSTRUCTIVE SLEEP APNEA  . MITRAL REGURGITATION, MILD  . ATRIAL ENLARGEMENT, LEFT  . DIASTOLIC DYSFUNCTION  . SINUSITIS, ACUTE  . ALLERGIC RHINITIS  . SYMPTOMATIC MENOPAUSAL/FEMALE CLIMACTERIC STATES  . ACTINIC KERATOSIS  . OSTEOARTHRITIS  . OSTEOPENIA  . MALAISE AND FATIGUE  . DYSPNEA  . SNORING  . FREQUENCY, URINARY  . ABNORMAL GLUCOSE NEC  . COLONIC POLYPS, HX OF  . Upper respiratory infection  . Dizziness - light-headed    Past Medical History  Diagnosis Date  . Thyroid disease   . Arthritis   . Allergy   . History of colonic polyps   . OSA (obstructive sleep apnea)   . Mitral regurgitation   . Diastolic dysfunction     Past Surgical History  Procedure Date  . 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 Polypectomy     colon  . Joint replacement 2008    rt knee    History  Substance Use Topics  . Smoking status: Never Smoker   . Smokeless tobacco: Not on file  . Alcohol Use: Yes    Family History  Problem Relation Age of Onset  . Heart attack Father   . Hyperlipidemia Father   . Heart disease  Father   . Arthritis Other     Allergies  Allergen Reactions  . Sulfonamide Derivatives     REACTION: Rash    Medication list has been reviewed and updated.  Current Outpatient Prescriptions on File Prior to Visit  Medication Sig Dispense Refill  . calcium citrate-vitamin D (CITRACAL+D) 315-200 MG-UNIT per tablet Take 1 tablet by mouth 2 (two) times daily.      . DULoxetine (CYMBALTA) 30 MG capsule Take 1 capsule (30 mg total) by mouth daily.  90 capsule  3  . levothyroxine (SYNTHROID, LEVOTHROID) 88 MCG tablet Take 1 tablet (88 mcg total) by mouth daily.  90 tablet  3  . mometasone (NASONEX) 50 MCG/ACT nasal spray Place 2 sprays into the nose daily.  51 g  3  . atorvastatin (LIPITOR) 10 MG tablet Take 1 tablet (10 mg total) by mouth daily.  90 tablet  3  . fexofenadine-pseudoephedrine (ALLEGRA-D 24) 180-240 MG per 24 hr tablet Take 1 tablet by mouth daily as needed.      . meclizine (ANTIVERT) 25 MG tablet Take 25 mg by mouth 3 (three) times daily as needed.        Review of Systems:  As per HPI, otherwise negative.    Physical Examination: Filed Vitals:   11/08/12  1330  BP: 122/80  Pulse: 77  Temp: 98.3 F (36.8 C)  Resp: 18   Filed Vitals:   11/08/12 1330  Height: 5\' 4"  (1.626 m)  Weight: 156 lb 12.8 oz (71.124 kg)   Body mass index is 26.91 kg/(m^2). Ideal Body Weight: Weight in (lb) to have BMI = 25: 145.3   GEN: WDWN, NAD, Non-toxic, A & O x 3  No rash or sepsis or shortness of breath HEENT: Atraumatic, Normocephalic. Neck supple. No masses, No LAD.  Oropharynx negative. Herpes labialis both lips Ears and Nose: No external deformity.  Oropharynx negative CV: RRR, No M/G/R. No JVD. No thrill. No extra heart sounds. PULM: CTA B, no wheezes, crackles, rhonchi. No retractions. No resp. distress. No accessory muscle use. ABD: S, NT, ND, +BS. No rebound. No HSM. EXTR: No c/c/e NEURO Normal gait.  PSYCH: Normally interactive. Conversant. Not depressed or anxious  appearing.  Calm demeanor.    Assessment and Plan: Herpes labialis Sinusitis Bronchitis augmentin mucinex d famvir Follow up as needed  Carmelina Dane, MD

## 2012-11-11 NOTE — Progress Notes (Signed)
Reviewed and agree.

## 2012-11-18 ENCOUNTER — Ambulatory Visit: Payer: BC Managed Care – PPO | Admitting: Internal Medicine

## 2012-11-18 ENCOUNTER — Encounter (HOSPITAL_COMMUNITY): Payer: Self-pay | Admitting: Pharmacy Technician

## 2012-11-19 NOTE — Patient Instructions (Addendum)
20 CHINAZA ROOKE  11/19/2012   Your procedure is scheduled on:  11/30/12  Monday   Surgery [id number]  Report to [facility] at    [phone]   AM.  Call this number if you have problems the morning of surgery: [phone]    Remember:   Do not eat food  Or drink :After Midnight.SUNDAY NIGHT   Take these medicines the morning of surgery with A SIP OF WATER: Levothyroxine   .  Contacts, dentures or partial plates can not be worn to surgery  Leave suitcase in the car. After surgery it may be brought to your room.  For patients admitted to the hospital, checkout time is 11:00 AM day of  discharge.             SPECIAL INSTRUCTIONS- SEE [place] PREPARING FOR SURGERY INSTRUCTION SHEET-     DO NOT WEAR JEWELRY, LOTIONS, POWDERS, OR PERFUMES.  WOMEN-- DO NOT SHAVE LEGS OR UNDERARMS FOR 12 HOURS BEFORE SHOWERS. MEN MAY SHAVE FACE.  Patients discharged the day of surgery will not be allowed to drive home. IF going home the day of surgery, you must have a driver and someone to stay with you for the first 24 hours  Name and phone number of your driver:  admission                                                                      Please read over the following fact sheets that you were given: MRSA Information, Incentive Spirometry Sheet, Blood Transfusion Sheet  Information                                                                                   Messina Kosinski  PST [phone]  [phone]

## 2012-11-19 NOTE — Progress Notes (Signed)
Clearance Dr Lovell Sheehan on chart

## 2012-11-20 ENCOUNTER — Encounter (HOSPITAL_COMMUNITY): Payer: Self-pay

## 2012-11-20 ENCOUNTER — Other Ambulatory Visit: Payer: Self-pay

## 2012-11-20 ENCOUNTER — Encounter (HOSPITAL_COMMUNITY)
Admission: RE | Admit: 2012-11-20 | Discharge: 2012-11-20 | Disposition: A | Discharge status: Home / Self Care | Payer: BC Managed Care – PPO | Source: Ambulatory Visit | Attending: Orthopedic Surgery | Admitting: Orthopedic Surgery

## 2012-11-20 HISTORY — DX: Major depressive disorder, single episode, unspecified: F32.9

## 2012-11-20 HISTORY — DX: Tinnitus, unspecified ear: H93.19

## 2012-11-20 HISTORY — DX: Pure hypercholesterolemia, unspecified: E78.00

## 2012-11-20 HISTORY — DX: Depression, unspecified: F32.A

## 2012-11-20 LAB — SURGICAL PCR SCREEN: Staphylococcus aureus: NEGATIVE

## 2012-11-20 LAB — CBC
MCH: 29.8 pg (ref 26.0–34.0)
MCHC: 32.6 g/dL (ref 30.0–36.0)
Platelets: 373 10*3/uL (ref 150–400)
RBC: 4.43 MIL/uL (ref 3.87–5.11)
RDW: 13 % (ref 11.5–15.5)

## 2012-11-20 LAB — COMPREHENSIVE METABOLIC PANEL
ALT: 16 U/L (ref 0–35)
AST: 20 U/L (ref 0–37)
Albumin: 3.8 g/dL (ref 3.5–5.2)
CO2: 27 mEq/L (ref 19–32)
Calcium: 9.8 mg/dL (ref 8.4–10.5)
Sodium: 140 mEq/L (ref 135–145)
Total Protein: 7.4 g/dL (ref 6.0–8.3)

## 2012-11-20 LAB — URINALYSIS, ROUTINE W REFLEX MICROSCOPIC
Bilirubin Urine: NEGATIVE
Hgb urine dipstick: NEGATIVE
Ketones, ur: NEGATIVE mg/dL
Protein, ur: NEGATIVE mg/dL
Urobilinogen, UA: 0.2 mg/dL (ref 0.0–1.0)

## 2012-11-29 ENCOUNTER — Other Ambulatory Visit: Payer: Self-pay | Admitting: Orthopedic Surgery

## 2012-11-29 NOTE — H&P (Signed)
Morgan [name]  DOB: 05/03/1951 Married / Language: English / Race: White Female  Date of Admission:  11/30/2012  Chief Complaint:  Left Knee Pain  History of Present Illness The patient is a 61 year old female who comes in for a preoperative History and Physical. The patient is scheduled for a left total knee arthroplasty to be performed by Dr. Frank V. Aluisio, MD at [facility] on 11/30/12. The patient is a 61 year old female who presents for follow up of their knee. The patient is being followed for their left knee pain and osteoarthritis. Symptoms reported today include: pain. The patient feels that they are doing poorly. The patient indicates that they have questions or concerns today regarding total knee arthroplasty. She would like to have this done before the end of the year.. Note for "Follow-up Knee": She is 5 1/2 years out from right total knee. Her left knee has gotten progressively worse over time. She's very happy with how her right knee has done. The left knee starting to bother her to the point where it is limiting what she can and cannot do. She has pain at all times. She has night pain also. She has noticed acute worsening in the past several months. She is ready to get the knee fixed. They have been treated conservatively in the past for the above stated problem and despite conservative measures, they continue to have progressive pain and severe functional limitations and dysfunction. They have failed non-operative management including home exercise, medications. It is felt that they would benefit from undergoing total joint replacement. Risks and benefits of the procedure have been discussed with the patient and they elect to proceed with surgery. There are no active contraindications to surgery such as ongoing infection or rapidly progressive neurological disease.     Allergies Sulfa Drugs. Rash.   Family History Father. History of Brain  Tumor Mother. Deceased. Natural causes   Social History Pain Contract. no Number of flights of stairs before winded. 2-3 Marital status. married Tobacco use. never smoker Tobacco / smoke exposure. no Previously in rehab. no Living situation. live with spouse Current work status. retired Children. 1 Alcohol use. current drinker; drinks wine; only occasionally per week Illicit drug use. no Exercise. Exercises weekly; does other Drug/Alcohol Rehab (Currently). no Post-Surgical Plans. Plan is to go home. Advance Directives. Living Will, Healthcare POA   Medication History Atorvastatin Calcium (10MG Tablet, Oral) Active. Cymbalta (30MG Capsule DR Part, Oral) Active. Synthroid (88MCG Tablet, Oral) Active.   Past Surgical History Total Knee Replacement. Date: 03/2007. right Colon Polyp Removal - Colonoscopy   Medical History Osteoarthritis Hypothyroidism Hypercholesterolemia Tinnitus Sleep Apnea. No CPAP Menopause   Review of Systems General:Not Present- Chills, Fever, Night Sweats, Fatigue, Weight Gain, Weight Loss and Memory Loss. Skin:Not Present- Hives, Itching, Rash, Eczema and Lesions. HEENT:Not Present- Tinnitus, Headache, Double Vision, Visual Loss, Hearing Loss and Dentures. Respiratory:Not Present- Shortness of breath with exertion, Shortness of breath at rest, Allergies, Coughing up blood and Chronic Cough. Cardiovascular:Not Present- Chest Pain, Racing/skipping heartbeats, Difficulty Breathing Lying Down, Murmur, Swelling and Palpitations. Gastrointestinal:Not Present- Bloody Stool, Heartburn, Abdominal Pain, Vomiting, Nausea, Constipation, Diarrhea, Difficulty Swallowing, Jaundice and Loss of appetitie. Female Genitourinary:Not Present- Blood in Urine, Urinary frequency, Weak urinary stream, Discharge, Flank Pain, Incontinence, Painful Urination, Urgency, Urinary Retention and Urinating at Night. Musculoskeletal:Present- Joint  Pain. Not Present- Muscle Weakness, Muscle Pain, Joint Swelling, Back Pain, Morning Stiffness and Spasms. Neurological:Not Present- Tremor, Dizziness, Blackout spells, Paralysis, Difficulty   with balance and Weakness. Psychiatric:Not Present- Insomnia.   Vitals Weight: 154 lb Height: 63 in Weight was reported by patient. Height was reported by patient. Body Surface Area: 1.76 m Body Mass Index: 27.28 kg/m Pulse: 68 (Regular) Resp.: 14 (Unlabored) BP: 108/62 (Sitting, Right Arm, Standard)    Physical Exam The physical exam findings are as follows:  Note: Patient is a 61 year old female with continued knee pain.   General Mental Status - Alert, cooperative and good historian. General Appearance- pleasant. Not in acute distress. Orientation- Oriented X3. Build & Nutrition- Well nourished and Well developed.   Head and Neck Head- normocephalic, atraumatic . Neck Global Assessment- supple. no bruit auscultated on the right and no bruit auscultated on the left.   Eye Pupil- Bilateral- Regular and Round. Motion- Bilateral- EOMI.   Chest and Lung Exam Auscultation: Breath sounds:- clear at anterior chest wall and - clear at posterior chest wall. Adventitious sounds:- No Adventitious sounds.   Cardiovascular Auscultation:Rhythm- Regular rate and rhythm. Heart Sounds- S1 WNL and S2 WNL. Murmurs & Other Heart Sounds:Auscultation of the heart reveals - No Murmurs.   Abdomen Palpation/Percussion:Tenderness- Abdomen is non-tender to palpation. Rigidity (guarding)- Abdomen is soft. Auscultation:Auscultation of the abdomen reveals - Bowel sounds normal.   Female Genitourinary  Not done, not pertinent to present illness  Musculoskeletal On exam, she is a well-developed female, alert and oriented, in no apparent distress. Her right knee looks great. Her range is 0-125 with no swelling, tenderness or instability. Left knee varus  deformity. No effusion. Range is 5-120. Moderate crepitus on ROM. Tender medial greater than lateral with no instability noted. Pulses, sensation and motor are intact.  RADIOGRAPHS: AP of both knees and lateral show excellent appearance of the prosthesis in the right knee, no periprosthetic abnormalities. On the left, she has end stage arthritis, medial and patellofemoral, with bone on bone changes of both and with varus deformity.  Assessment & Plan Osteoarthrosis NOS, lower leg (715.96) Impression: Left Knee  Note: Plan is for a left total knee replacement by Dr. Aluisio.  Plan is to go home with husband. Patient would like to look into the possibility of getting a home CPM because of the scar tissue that occurred after her previous knee replacement.  PCP - Dr. Jenkins - Patient has been seen preoperatively and felt to be stable for surgery.  Signed electronically by DREW L Ladonne Sharples, PA-C  

## 2012-11-29 NOTE — Anesthesia Preprocedure Evaluation (Addendum)
Anesthesia Evaluation  Patient identified by MRN, date of birth, ID band Patient awake    Reviewed: Allergy & Precautions, H&P , NPO status , Patient's Chart, lab work & pertinent test results  Airway Mallampati: III TM Distance: >3 FB Neck ROM: full    Dental No notable dental hx. (+) Teeth Intact and Dental Advisory Given   Pulmonary neg pulmonary ROS, shortness of breath and with exertion, sleep apnea ,  Mild OSA breath sounds clear to auscultation  Pulmonary exam normal       Cardiovascular + Valvular Problems/Murmurs MR Rhythm:regular Rate:Normal + Diastolic murmurs Diastolic dysfunction.  LAE.  myoview 8/13   Neuro/Psych Depression negative neurological ROS  negative psych ROS   GI/Hepatic negative GI ROS, Neg liver ROS,   Endo/Other  negative endocrine ROSHypothyroidism   Renal/GU negative Renal ROS  negative genitourinary   Musculoskeletal   Abdominal   Peds  Hematology negative hematology ROS (+)   Anesthesia Other Findings   Reproductive/Obstetrics negative OB ROS                          Anesthesia Physical Anesthesia Plan  ASA: III  Anesthesia Plan: Spinal   Post-op Pain Management:    Induction:   Airway Management Planned:   Additional Equipment:   Intra-op Plan:   Post-operative Plan:   Informed Consent: I have reviewed the patients History and Physical, chart, labs and discussed the procedure including the risks, benefits and alternatives for the proposed anesthesia with the patient or authorized representative who has indicated his/her understanding and acceptance.   Dental Advisory Given  Plan Discussed with: CRNA and Surgeon  Anesthesia Plan Comments:         Anesthesia Quick Evaluation

## 2012-11-30 ENCOUNTER — Encounter (HOSPITAL_COMMUNITY): Payer: Self-pay | Admitting: *Deleted

## 2012-11-30 ENCOUNTER — Inpatient Hospital Stay (HOSPITAL_COMMUNITY)
Admission: RE | Admit: 2012-11-30 | Discharge: 2012-12-02 | DRG: 209 | Disposition: A | Discharge status: Home Health | Payer: BC Managed Care – PPO | Source: Ambulatory Visit | Attending: Orthopedic Surgery | Admitting: Orthopedic Surgery

## 2012-11-30 ENCOUNTER — Encounter (HOSPITAL_COMMUNITY)
Admission: RE | Disposition: A | Discharge status: Home Health | Payer: Self-pay | Source: Ambulatory Visit | Attending: Orthopedic Surgery

## 2012-11-30 ENCOUNTER — Encounter (HOSPITAL_COMMUNITY): Payer: Self-pay | Admitting: Anesthesiology

## 2012-11-30 ENCOUNTER — Ambulatory Visit (HOSPITAL_COMMUNITY): Payer: BC Managed Care – PPO | Admitting: Anesthesiology

## 2012-11-30 DIAGNOSIS — F329 Major depressive disorder, single episode, unspecified: Secondary | ICD-10-CM | POA: Diagnosis present

## 2012-11-30 DIAGNOSIS — M1710 Unilateral primary osteoarthritis, unspecified knee: Principal | ICD-10-CM | POA: Diagnosis present

## 2012-11-30 DIAGNOSIS — D62 Acute posthemorrhagic anemia: Secondary | ICD-10-CM

## 2012-11-30 DIAGNOSIS — Z79899 Other long term (current) drug therapy: Secondary | ICD-10-CM

## 2012-11-30 DIAGNOSIS — I519 Heart disease, unspecified: Secondary | ICD-10-CM | POA: Diagnosis present

## 2012-11-30 DIAGNOSIS — I059 Rheumatic mitral valve disease, unspecified: Secondary | ICD-10-CM | POA: Diagnosis present

## 2012-11-30 DIAGNOSIS — M179 Osteoarthritis of knee, unspecified: Secondary | ICD-10-CM

## 2012-11-30 DIAGNOSIS — E039 Hypothyroidism, unspecified: Secondary | ICD-10-CM | POA: Diagnosis present

## 2012-11-30 DIAGNOSIS — E7800 Pure hypercholesterolemia, unspecified: Secondary | ICD-10-CM | POA: Diagnosis present

## 2012-11-30 DIAGNOSIS — G4733 Obstructive sleep apnea (adult) (pediatric): Secondary | ICD-10-CM | POA: Diagnosis present

## 2012-11-30 DIAGNOSIS — F3289 Other specified depressive episodes: Secondary | ICD-10-CM | POA: Diagnosis present

## 2012-11-30 DIAGNOSIS — Z96659 Presence of unspecified artificial knee joint: Secondary | ICD-10-CM

## 2012-11-30 HISTORY — PX: TOTAL KNEE ARTHROPLASTY: SHX125

## 2012-11-30 LAB — TYPE AND SCREEN: ABO/RH(D): A POS

## 2012-11-30 SURGERY — ARTHROPLASTY, KNEE, TOTAL
Anesthesia: Spinal | Site: Knee | Laterality: Left | Wound class: Clean

## 2012-11-30 MED ORDER — ACETAMINOPHEN 10 MG/ML IV SOLN: 1000.0000 mg | Freq: Once | INTRAVENOUS | Status: DC

## 2012-11-30 MED ORDER — TRAMADOL HCL 50 MG PO TABS
50.0000 mg | ORAL_TABLET | Freq: Four times a day (QID) | ORAL | Status: DC | PRN
  Administered 2012-12-02: 50 mg via ORAL
  Filled 2012-11-30: qty 1

## 2012-11-30 MED ORDER — CEFAZOLIN SODIUM 1-5 GM-% IV SOLN
1.0000 g | Freq: Four times a day (QID) | INTRAVENOUS | Status: AC
  Administered 2012-11-30 (×2): 1 g via INTRAVENOUS
  Filled 2012-11-30 (×2): qty 50

## 2012-11-30 MED ORDER — LACTATED RINGERS IV SOLN: INTRAVENOUS | Status: DC

## 2012-11-30 MED ORDER — ACETAMINOPHEN 325 MG PO TABS
650.0000 mg | ORAL_TABLET | Freq: Four times a day (QID) | ORAL | Status: DC | PRN
  Administered 2012-12-01 – 2012-12-02 (×3): 650 mg via ORAL
  Filled 2012-11-30 (×3): qty 2

## 2012-11-30 MED ORDER — ONDANSETRON HCL 4 MG PO TABS: 4.0000 mg | ORAL_TABLET | Freq: Four times a day (QID) | ORAL | Status: DC | PRN

## 2012-11-30 MED ORDER — HYDROMORPHONE HCL PF 1 MG/ML IJ SOLN
1.0000 mg | Freq: Once | INTRAMUSCULAR | Status: AC
  Administered 2012-11-30: 1 mg via INTRAVENOUS
  Filled 2012-11-30: qty 1

## 2012-11-30 MED ORDER — DEXAMETHASONE SODIUM PHOSPHATE 10 MG/ML IJ SOLN
10.0000 mg | Freq: Once | INTRAMUSCULAR | Status: AC
  Filled 2012-11-30: qty 1

## 2012-11-30 MED ORDER — 0.9 % SODIUM CHLORIDE (POUR BTL) OPTIME
TOPICAL | Status: DC | PRN
  Administered 2012-11-30: 1000 mL

## 2012-11-30 MED ORDER — POLYETHYLENE GLYCOL 3350 17 G PO PACK: 17.0000 g | PACK | Freq: Every day | ORAL | Status: DC | PRN

## 2012-11-30 MED ORDER — MORPHINE SULFATE 2 MG/ML IJ SOLN
INTRAMUSCULAR | Status: AC
  Administered 2012-11-30: 1 mg via INTRAVENOUS
  Filled 2012-11-30: qty 1

## 2012-11-30 MED ORDER — FENTANYL CITRATE 0.05 MG/ML IJ SOLN
INTRAMUSCULAR | Status: DC | PRN
  Administered 2012-11-30: 50 ug via INTRAVENOUS
  Administered 2012-11-30: 100 ug via INTRAVENOUS

## 2012-11-30 MED ORDER — PROPOFOL 10 MG/ML IV EMUL
INTRAVENOUS | Status: DC | PRN
  Administered 2012-11-30: 120 ug/kg/min via INTRAVENOUS

## 2012-11-30 MED ORDER — HYDROMORPHONE HCL PF 1 MG/ML IJ SOLN: 0.2500 mg | INTRAMUSCULAR | Status: DC | PRN

## 2012-11-30 MED ORDER — CEFAZOLIN SODIUM-DEXTROSE 2-3 GM-% IV SOLR
INTRAVENOUS | Status: AC
  Filled 2012-11-30: qty 50

## 2012-11-30 MED ORDER — BUPIVACAINE ON-Q PAIN PUMP (FOR ORDER SET NO CHG)
INJECTION | Status: DC
  Filled 2012-11-30: qty 1

## 2012-11-30 MED ORDER — DIPHENHYDRAMINE HCL 12.5 MG/5ML PO ELIX: 12.5000 mg | ORAL_SOLUTION | ORAL | Status: DC | PRN

## 2012-11-30 MED ORDER — CEFAZOLIN SODIUM-DEXTROSE 2-3 GM-% IV SOLR
2.0000 g | INTRAVENOUS | Status: AC
  Administered 2012-11-30: 2 g via INTRAVENOUS

## 2012-11-30 MED ORDER — LEVOTHYROXINE SODIUM 88 MCG PO TABS
88.0000 ug | ORAL_TABLET | Freq: Every day | ORAL | Status: DC
  Administered 2012-12-01 – 2012-12-02 (×2): 88 ug via ORAL
  Filled 2012-11-30 (×3): qty 1

## 2012-11-30 MED ORDER — DEXAMETHASONE 6 MG PO TABS
10.0000 mg | ORAL_TABLET | Freq: Once | ORAL | Status: AC
  Administered 2012-12-01: 10 mg via ORAL
  Filled 2012-11-30: qty 1

## 2012-11-30 MED ORDER — DULOXETINE HCL 30 MG PO CPEP
30.0000 mg | ORAL_CAPSULE | Freq: Every evening | ORAL | Status: DC
  Administered 2012-11-30 – 2012-12-01 (×2): 30 mg via ORAL
  Filled 2012-11-30 (×3): qty 1

## 2012-11-30 MED ORDER — MORPHINE SULFATE 2 MG/ML IJ SOLN
1.0000 mg | INTRAMUSCULAR | Status: DC | PRN
  Administered 2012-11-30: 2 mg via INTRAVENOUS

## 2012-11-30 MED ORDER — ACETAMINOPHEN 10 MG/ML IV SOLN
INTRAVENOUS | Status: DC | PRN
  Administered 2012-11-30: 1000 mg via INTRAVENOUS

## 2012-11-30 MED ORDER — SODIUM CHLORIDE 0.9 % IV SOLN: INTRAVENOUS | Status: DC

## 2012-11-30 MED ORDER — MENTHOL 3 MG MT LOZG: 1.0000 | LOZENGE | OROMUCOSAL | Status: DC | PRN

## 2012-11-30 MED ORDER — ONDANSETRON HCL 4 MG/2ML IJ SOLN: 4.0000 mg | Freq: Four times a day (QID) | INTRAMUSCULAR | Status: DC | PRN

## 2012-11-30 MED ORDER — KCL IN DEXTROSE-NACL 20-5-0.9 MEQ/L-%-% IV SOLN
INTRAVENOUS | Status: DC
  Administered 2012-11-30: 75 mL/h via INTRAVENOUS
  Administered 2012-12-01: 08:00:00 via INTRAVENOUS
  Filled 2012-11-30 (×3): qty 1000

## 2012-11-30 MED ORDER — BUPIVACAINE 0.25 % ON-Q PUMP SINGLE CATH 300ML
INJECTION | Status: AC
  Filled 2012-11-30: qty 300

## 2012-11-30 MED ORDER — FLUTICASONE PROPIONATE 50 MCG/ACT NA SUSP: 2.0000 | Freq: Every day | NASAL | Status: DC | PRN

## 2012-11-30 MED ORDER — RIVAROXABAN 10 MG PO TABS
10.0000 mg | ORAL_TABLET | Freq: Every day | ORAL | Status: DC
  Administered 2012-12-01 – 2012-12-02 (×2): 10 mg via ORAL
  Filled 2012-11-30 (×3): qty 1

## 2012-11-30 MED ORDER — OXYCODONE HCL 5 MG PO TABS
5.0000 mg | ORAL_TABLET | ORAL | Status: DC | PRN
  Administered 2012-11-30 – 2012-12-01 (×7): 10 mg via ORAL
  Filled 2012-11-30 (×7): qty 2

## 2012-11-30 MED ORDER — LACTATED RINGERS IV SOLN
INTRAVENOUS | Status: DC | PRN
  Administered 2012-11-30 (×3): via INTRAVENOUS

## 2012-11-30 MED ORDER — METHOCARBAMOL 100 MG/ML IJ SOLN
500.0000 mg | Freq: Four times a day (QID) | INTRAVENOUS | Status: DC | PRN
  Administered 2012-11-30: 500 mg via INTRAVENOUS
  Filled 2012-11-30: qty 5

## 2012-11-30 MED ORDER — ACETAMINOPHEN 10 MG/ML IV SOLN
INTRAVENOUS | Status: AC
  Filled 2012-11-30: qty 100

## 2012-11-30 MED ORDER — FLEET ENEMA 7-19 GM/118ML RE ENEM: 1.0000 | ENEMA | Freq: Once | RECTAL | Status: AC | PRN

## 2012-11-30 MED ORDER — METHOCARBAMOL 500 MG PO TABS
500.0000 mg | ORAL_TABLET | Freq: Four times a day (QID) | ORAL | Status: DC | PRN
  Administered 2012-12-01: 500 mg via ORAL
  Filled 2012-11-30: qty 1

## 2012-11-30 MED ORDER — METOCLOPRAMIDE HCL 10 MG PO TABS: 5.0000 mg | ORAL_TABLET | Freq: Three times a day (TID) | ORAL | Status: DC | PRN

## 2012-11-30 MED ORDER — PROPOFOL 10 MG/ML IV BOLUS
INTRAVENOUS | Status: DC | PRN
  Administered 2012-11-30: 30 mg via INTRAVENOUS

## 2012-11-30 MED ORDER — ACETAMINOPHEN 10 MG/ML IV SOLN
1000.0000 mg | Freq: Four times a day (QID) | INTRAVENOUS | Status: AC
  Administered 2012-11-30 – 2012-12-01 (×4): 1000 mg via INTRAVENOUS
  Filled 2012-11-30 (×5): qty 100

## 2012-11-30 MED ORDER — PHENYLEPHRINE HCL 10 MG/ML IJ SOLN
INTRAMUSCULAR | Status: DC | PRN
  Administered 2012-11-30: 80 ug via INTRAVENOUS

## 2012-11-30 MED ORDER — ATORVASTATIN CALCIUM 10 MG PO TABS
10.0000 mg | ORAL_TABLET | Freq: Every evening | ORAL | Status: DC
  Administered 2012-11-30 – 2012-12-01 (×2): 10 mg via ORAL
  Filled 2012-11-30 (×3): qty 1

## 2012-11-30 MED ORDER — BUPIVACAINE 0.25 % ON-Q PUMP SINGLE CATH 100 ML
INJECTION | Status: DC | PRN
  Administered 2012-11-30: 300 mL

## 2012-11-30 MED ORDER — HYDROMORPHONE HCL PF 1 MG/ML IJ SOLN
0.5000 mg | INTRAMUSCULAR | Status: DC | PRN
  Administered 2012-11-30: 1 mg via INTRAVENOUS
  Filled 2012-11-30 (×2): qty 1

## 2012-11-30 MED ORDER — METOCLOPRAMIDE HCL 5 MG/ML IJ SOLN: 5.0000 mg | Freq: Three times a day (TID) | INTRAMUSCULAR | Status: DC | PRN

## 2012-11-30 MED ORDER — BISACODYL 10 MG RE SUPP: 10.0000 mg | Freq: Every day | RECTAL | Status: DC | PRN

## 2012-11-30 MED ORDER — ACETAMINOPHEN 650 MG RE SUPP: 650.0000 mg | Freq: Four times a day (QID) | RECTAL | Status: DC | PRN

## 2012-11-30 MED ORDER — DOCUSATE SODIUM 100 MG PO CAPS
100.0000 mg | ORAL_CAPSULE | Freq: Two times a day (BID) | ORAL | Status: DC
  Administered 2012-11-30 – 2012-12-02 (×4): 100 mg via ORAL

## 2012-11-30 MED ORDER — PHENOL 1.4 % MT LIQD: 1.0000 | OROMUCOSAL | Status: DC | PRN

## 2012-11-30 MED ORDER — MIDAZOLAM HCL 5 MG/5ML IJ SOLN
INTRAMUSCULAR | Status: DC | PRN
  Administered 2012-11-30: 2 mg via INTRAVENOUS

## 2012-11-30 SURGICAL SUPPLY — 60 items
BAG SPEC THK2 15X12 ZIP CLS (MISCELLANEOUS) ×1
BAG ZIPLOCK 12X15 (MISCELLANEOUS) ×2 IMPLANT
BANDAGE ELASTIC 6 VELCRO ST LF (GAUZE/BANDAGES/DRESSINGS) ×2 IMPLANT
BANDAGE ESMARK 6X9 LF (GAUZE/BANDAGES/DRESSINGS) ×1 IMPLANT
BLADE SAG 18X100X1.27 (BLADE) ×2 IMPLANT
BLADE SAW SGTL 11.0X1.19X90.0M (BLADE) ×2 IMPLANT
BNDG CMPR 9X6 STRL LF SNTH (GAUZE/BANDAGES/DRESSINGS) ×1
BNDG ESMARK 6X9 LF (GAUZE/BANDAGES/DRESSINGS) ×2
BOWL SMART MIX CTS (DISPOSABLE) ×2 IMPLANT
CATH KIT ON-Q SILVERSOAK 5 (CATHETERS) ×1 IMPLANT
CATH KIT ON-Q SILVERSOAK 5IN (CATHETERS) ×2 IMPLANT
CEMENT HV SMART SET (Cement) ×4 IMPLANT
CLOTH BEACON ORANGE TIMEOUT ST (SAFETY) ×2 IMPLANT
CLSR STERI-STRIP ANTIMIC 1/2X4 (GAUZE/BANDAGES/DRESSINGS) ×1 IMPLANT
CUFF TOURN SGL QUICK 34 (TOURNIQUET CUFF) ×2
CUFF TRNQT CYL 34X4X40X1 (TOURNIQUET CUFF) ×1 IMPLANT
DRAPE EXTREMITY T 121X128X90 (DRAPE) ×2 IMPLANT
DRAPE POUCH INSTRU U-SHP 10X18 (DRAPES) ×2 IMPLANT
DRAPE U-SHAPE 47X51 STRL (DRAPES) ×2 IMPLANT
DRSG ADAPTIC 3X8 NADH LF (GAUZE/BANDAGES/DRESSINGS) ×2 IMPLANT
DURAPREP 26ML APPLICATOR (WOUND CARE) ×2 IMPLANT
ELECT REM PT RETURN 9FT ADLT (ELECTROSURGICAL) ×2
ELECTRODE REM PT RTRN 9FT ADLT (ELECTROSURGICAL) ×1 IMPLANT
EVACUATOR 1/8 PVC DRAIN (DRAIN) ×2 IMPLANT
FACESHIELD LNG OPTICON STERILE (SAFETY) ×10 IMPLANT
GLOVE BIO SURGEON STRL SZ 6.5 (GLOVE) ×1 IMPLANT
GLOVE BIO SURGEON STRL SZ8 (GLOVE) ×2 IMPLANT
GLOVE BIOGEL PI IND STRL 7.5 (GLOVE) IMPLANT
GLOVE BIOGEL PI IND STRL 8 (GLOVE) ×2 IMPLANT
GLOVE BIOGEL PI INDICATOR 7.5 (GLOVE) ×1
GLOVE BIOGEL PI INDICATOR 8 (GLOVE) ×2
GLOVE ECLIPSE 7.5 STRL STRAW (GLOVE) ×1 IMPLANT
GLOVE ECLIPSE 8.0 STRL XLNG CF (GLOVE) ×2 IMPLANT
GLOVE INDICATOR 6.5 STRL GRN (GLOVE) ×1 IMPLANT
GLOVE SURG SS PI 6.5 STRL IVOR (GLOVE) ×4 IMPLANT
GLOVE SURG SS PI 7.5 STRL IVOR (GLOVE) ×3 IMPLANT
GOWN STRL NON-REIN LRG LVL3 (GOWN DISPOSABLE) ×4 IMPLANT
GOWN STRL REIN XL XLG (GOWN DISPOSABLE) ×2 IMPLANT
HANDPIECE INTERPULSE COAX TIP (DISPOSABLE) ×2
IMMOBILIZER KNEE 20 (SOFTGOODS) ×2
IMMOBILIZER KNEE 20 THIGH 36 (SOFTGOODS) ×1 IMPLANT
KIT BASIN OR (CUSTOM PROCEDURE TRAY) ×2 IMPLANT
MANIFOLD NEPTUNE II (INSTRUMENTS) ×2 IMPLANT
NS IRRIG 1000ML POUR BTL (IV SOLUTION) ×2 IMPLANT
PACK TOTAL JOINT (CUSTOM PROCEDURE TRAY) ×2 IMPLANT
PAD ABD 7.5X8 STRL (GAUZE/BANDAGES/DRESSINGS) ×2 IMPLANT
PADDING CAST COTTON 6X4 STRL (CAST SUPPLIES) ×4 IMPLANT
POSITIONER SURGICAL ARM (MISCELLANEOUS) ×2 IMPLANT
SET HNDPC FAN SPRY TIP SCT (DISPOSABLE) ×1 IMPLANT
SPONGE GAUZE 4X4 12PLY (GAUZE/BANDAGES/DRESSINGS) ×2 IMPLANT
STRIP CLOSURE SKIN 1/2X4 (GAUZE/BANDAGES/DRESSINGS) ×4 IMPLANT
SUCTION FRAZIER 12FR DISP (SUCTIONS) ×2 IMPLANT
SUT MNCRL AB 4-0 PS2 18 (SUTURE) ×2 IMPLANT
SUT VIC AB 2-0 CT1 27 (SUTURE) ×6
SUT VIC AB 2-0 CT1 TAPERPNT 27 (SUTURE) ×3 IMPLANT
SUT VLOC 180 0 24IN GS25 (SUTURE) ×2 IMPLANT
TOWEL OR 17X26 10 PK STRL BLUE (TOWEL DISPOSABLE) ×4 IMPLANT
TRAY FOLEY CATH 14FRSI W/METER (CATHETERS) ×2 IMPLANT
WATER STERILE IRR 1500ML POUR (IV SOLUTION) ×2 IMPLANT
WRAP KNEE MAXI GEL POST OP (GAUZE/BANDAGES/DRESSINGS) ×4 IMPLANT

## 2012-11-30 NOTE — Interval H&P Note (Signed)
History and Physical Interval Note:  11/30/2012 7:08 AM  Morgan [name]  has presented today for surgery, with the diagnosis of osteoarthritis left knee  The various methods of treatment have been discussed with the patient and family. After consideration of risks, benefits and other options for treatment, the patient has consented to  Procedure(s) (LRB) with comments: TOTAL KNEE ARTHROPLASTY (Left) as a surgical intervention .  The patient's history has been reviewed, patient examined, no change in status, stable for surgery.  I have reviewed the patient's chart and labs.  Questions were answered to the patient's satisfaction.     Loanne Drilling

## 2012-11-30 NOTE — Anesthesia Procedure Notes (Signed)
Spinal  Patient location during procedure: OR Start time: 11/30/2012 8:15 AM End time: 11/30/2012 8:29 AM Staffing CRNA/Resident: Akshay Spang E Performed by: resident/CRNA  Preanesthetic Checklist Completed: patient identified, site marked, surgical consent, pre-op evaluation, IV checked, risks and benefits discussed and monitors and equipment checked Spinal Block Patient position: sitting Prep: Betadine Patient monitoring: continuous pulse ox and blood pressure Approach: midline Location: L3-4 Injection technique: single-shot Needle Needle type: Whitacre  Needle gauge: 25 G Needle insertion depth: 8 cm Assessment Sensory level: T8 Additional Notes CSF x3,  Negative  heme, negative paresthesia. Pt tolerated well.

## 2012-11-30 NOTE — Anesthesia Postprocedure Evaluation (Signed)
  Anesthesia Post-op Note  Patient: Morgan [name]  Procedure(s) Performed: Procedure(s) (LRB): TOTAL KNEE ARTHROPLASTY (Left)  Patient Location: PACU  Anesthesia Type: Spinal  Level of Consciousness: awake and alert   Airway and Oxygen Therapy: Patient Spontanous Breathing  Post-op Pain: mild  Post-op Assessment: Post-op Vital signs reviewed, Patient's Cardiovascular Status Stable, Respiratory Function Stable, Patent Airway and No signs of Nausea or vomiting  Last Vitals:  Filed Vitals:   11/30/12 1015  BP: 119/52  Pulse: 67  Temp:   Resp: 13    Post-op Vital Signs: stable   Complications: No apparent anesthesia complications

## 2012-11-30 NOTE — Op Note (Signed)
Pre-operative diagnosis- Osteoarthritis  Left knee(s)  Post-operative diagnosis- Osteoarthritis Left knee(s)  Procedure-  Left  Total Knee Arthroplasty  Surgeon- Gus Rankin. Morgan Dea, MD  Assistant- Dimitri Ped, PA-C   Anesthesia-  Spinal EBL-* No blood loss amount entered *  Drains Hemovac  Tourniquet time- 32 minutes @ 300 mm Hg  Complications- None  Condition-PACU - hemodynamically stable.   Brief Clinical Note  Morgan [name] is a 61 y.o. year old female with end stage OA of her left knee with progressively worsening pain and dysfunction. She has constant pain, with activity and at rest and significant functional deficits with difficulties even with ADLs. She has had extensive non-op management including analgesics, injections of cortisone and viscosupplements, and home exercise program, but remains in significant pain with significant dysfunction. Radiographs show bone on bone arthritis medial and patellofemoral with varus deformity. She presents now for left Total Knee Arthroplasty.    Procedure in detail---   The patient is brought into the operating room and positioned supine on the operating table. After successful administration of  Spinal,   a tourniquet is placed high on the  Left thigh(s) and the lower extremity is prepped and draped in the usual sterile fashion. Time out is performed by the operating team and then the  Left lower extremity is wrapped in Esmarch, knee flexed and the tourniquet inflated to 300 mmHg.       A midline incision is made with a ten blade through the subcutaneous tissue to the level of the extensor mechanism. A fresh blade is used to make a medial parapatellar arthrotomy. Soft tissue over the proximal medial tibia is subperiosteally elevated to the joint line with a knife and into the semimembranosus bursa with a Cobb elevator. Soft tissue over the proximal lateral tibia is elevated with attention being paid to avoiding the patellar tendon on the tibial  tubercle. The patella is everted, knee flexed 90 degrees and the ACL and PCL are removed. Findings are bone on bone medial and patellofemoral with large medial osteophytes.        The drill is used to create a starting hole in the distal femur and the canal is thoroughly irrigated with sterile saline to remove the fatty contents. The 5 degree Left  valgus alignment guide is placed into the femoral canal and the distal femoral cutting block is pinned to remove 10 mm off the distal femur. Resection is made with an oscillating saw.      The tibia is subluxed forward and the menisci are removed. The extramedullary alignment guide is placed referencing proximally at the medial aspect of the tibial tubercle and distally along the second metatarsal axis and tibial crest. The block is pinned to remove 2mm off the more deficient medial  side. Resection is made with an oscillating saw. Size 2is the most appropriate size for the tibia and the proximal tibia is prepared with the modular drill and keel punch for that size.      The femoral sizing guide is placed and size 2.5 is most appropriate. Rotation is marked off the epicondylar axis and confirmed by creating a rectangular flexion gap at 90 degrees. The size 2.5 cutting block is pinned in this rotation and the anterior, posterior and chamfer cuts are made with the oscillating saw. The intercondylar block is then placed and that cut is made.      Trial size 2 tibial component, trial size 2.5 posterior stabilized femur and a 10  mm posterior  stabilized rotating platform insert trial is placed. Full extension is achieved with excellent varus/valgus and anterior/posterior balance throughout full range of motion. The patella is everted and thickness measured to be 22  mm. Free hand resection is taken to 12 mm, a 35 template is placed, lug holes are drilled, trial patella is placed, and it tracks normally. Osteophytes are removed off the posterior femur with the trial in  place. All trials are removed and the cut bone surfaces prepared with pulsatile lavage. Cement is mixed and once ready for implantation, the size 2 tibial implant, size  2.5 posterior stabilized femoral component, and the size 35 patella are cemented in place and the patella is held with the clamp. The trial insert is placed and the knee held in full extension. All extruded cement is removed and once the cement is hard the permanent 10 mm posterior stabilized rotating platform insert is placed into the tibial tray.      The wound is copiously irrigated with saline solution and the extensor mechanism closed over a hemovac drain with #1 PDS suture. The tourniquet is released for a total tourniquet time of 32  minutes. Flexion against gravity is 140 degrees and the patella tracks normally. Subcutaneous tissue is closed with 2.0 vicryl and subcuticular with running 4.0 Monocryl. The catheter for the Marcaine pain pump is placed and the pump is initiated. The incision is cleaned and dried and steri-strips and a bulky sterile dressing are applied. The limb is placed into a knee immobilizer and the patient is awakened and transported to recovery in stable condition.      Please note that a surgical assistant was a medical necessity for this procedure in order to perform it in a safe and expeditious manner. Surgical assistant was necessary to retract the ligaments and vital neurovascular structures to prevent injury to them and also necessary for proper positioning of the limb to allow for anatomic placement of the prosthesis.   Gus Rankin Gema Ringold, MD    11/30/2012, 9:18 AM

## 2012-11-30 NOTE — Addendum Note (Signed)
Addendum  created 11/30/12 1118 by Illene Silver, CRNA   Modules edited:Anesthesia Events

## 2012-11-30 NOTE — Addendum Note (Signed)
Addendum  created 11/30/12 1118 by Donnalee Cellucci E Etheleen Valtierra, CRNA   Modules edited:Anesthesia Events    

## 2012-11-30 NOTE — Transfer of Care (Signed)
Immediate Anesthesia Transfer of Care Note  Patient: Morgan [name]  Procedure(s) Performed: Procedure(s) (LRB) with comments: TOTAL KNEE ARTHROPLASTY (Left)  Patient Location: PACU  Anesthesia Type:Spinal  Level of Consciousness: awake, alert , oriented and patient cooperative  Airway & Oxygen Therapy: Patient Spontanous Breathing and Patient connected to face mask oxygen  Post-op Assessment: Report given to PACU RN and Post -op Vital signs reviewed and stable  Post vital signs: stable  Complications: No apparent anesthesia complications

## 2012-11-30 NOTE — H&P (View-Only) (Signed)
Morgan [name]  DOB: 29-Apr-1951 Married / Language: English / Race: White Female  Date of Admission:  11/30/2012  Chief Complaint:  Left Knee Pain  History of Present Illness The patient is a 61 year old female who comes in for a preoperative History and Physical. The patient is scheduled for a left total knee arthroplasty to be performed by Dr. Gus Rankin. Aluisio, MD at [facility] on 11/30/12. The patient is a 61 year old female who presents for follow up of their knee. The patient is being followed for their left knee pain and osteoarthritis. Symptoms reported today include: pain. The patient feels that they are doing poorly. The patient indicates that they have questions or concerns today regarding total knee arthroplasty. She would like to have this done before the end of the year.. Note for "Follow-up Knee": She is 5 1/2 years out from right total knee. Her left knee has gotten progressively worse over time. She's very happy with how her right knee has done. The left knee starting to bother her to the point where it is limiting what she can and cannot do. She has pain at all times. She has night pain also. She has noticed acute worsening in the past several months. She is ready to get the knee fixed. They have been treated conservatively in the past for the above stated problem and despite conservative measures, they continue to have progressive pain and severe functional limitations and dysfunction. They have failed non-operative management including home exercise, medications. It is felt that they would benefit from undergoing total joint replacement. Risks and benefits of the procedure have been discussed with the patient and they elect to proceed with surgery. There are no active contraindications to surgery such as ongoing infection or rapidly progressive neurological disease.     Allergies Sulfa Drugs. Rash.   Family History Father. History of Brain  Tumor Mother. Deceased. Natural causes   Social History Pain Contract. no Number of flights of stairs before winded. 2-3 Marital status. married Tobacco use. never smoker Tobacco / smoke exposure. no Previously in rehab. no Living situation. live with spouse Current work status. retired Copywriter, advertising. 1 Alcohol use. current drinker; drinks wine; only occasionally per week Illicit drug use. no Exercise. Exercises weekly; does other Drug/Alcohol Rehab (Currently). no Post-Surgical Plans. Plan is to go home. Advance Directives. Living Will, Healthcare POA   Medication History Atorvastatin Calcium (10MG  Tablet, Oral) Active. Cymbalta (30MG  Capsule DR Part, Oral) Active. Synthroid ( Tablet, Oral) Active.   Past Surgical History Total Knee Replacement. Date: 03/2007. right Colon Polyp Removal - Colonoscopy   Medical History Osteoarthritis Hypothyroidism Hypercholesterolemia Tinnitus Sleep Apnea. No CPAP Menopause   Review of Systems General:Not Present- Chills, Fever, Night Sweats, Fatigue, Weight Gain, Weight Loss and Memory Loss. Skin:Not Present- Hives, Itching, Rash, Eczema and Lesions. HEENT:Not Present- Tinnitus, Headache, Double Vision, Visual Loss, Hearing Loss and Dentures. Respiratory:Not Present- Shortness of breath with exertion, Shortness of breath at rest, Allergies, Coughing up blood and Chronic Cough. Cardiovascular:Not Present- Chest Pain, Racing/skipping heartbeats, Difficulty Breathing Lying Down, Murmur, Swelling and Palpitations. Gastrointestinal:Not Present- Bloody Stool, Heartburn, Abdominal Pain, Vomiting, Nausea, Constipation, Diarrhea, Difficulty Swallowing, Jaundice and Loss of appetitie. Female Genitourinary:Not Present- Blood in Urine, Urinary frequency, Weak urinary stream, Discharge, Flank Pain, Incontinence, Painful Urination, Urgency, Urinary Retention and Urinating at Night. Musculoskeletal:Present- Joint  Pain. Not Present- Muscle Weakness, Muscle Pain, Joint Swelling, Back Pain, Morning Stiffness and Spasms. Neurological:Not Present- Tremor, Dizziness, Blackout spells, Paralysis, Difficulty  with balance and Weakness. Psychiatric:Not Present- Insomnia.   Vitals Weight: 154 lb Height: 63 in Weight was reported by patient. Height was reported by patient. Body Surface Area: 1.76 m Body Mass Index: 27.28 kg/m Pulse: 68 (Regular) Resp.: 14 (Unlabored) BP: 108/62 (Sitting, Right Arm, Standard)    Physical Exam The physical exam findings are as follows:  Note: Patient is a 61 year old female with continued knee pain.   General Mental Status - Alert, cooperative and good historian. General Appearance- pleasant. Not in acute distress. Orientation- Oriented X3. Build & Nutrition- Well nourished and Well developed.   Head and Neck Head- normocephalic, atraumatic . Neck Global Assessment- supple. no bruit auscultated on the right and no bruit auscultated on the left.   Eye Pupil- Bilateral- Regular and Round. Motion- Bilateral- EOMI.   Chest and Lung Exam Auscultation: Breath sounds:- clear at anterior chest wall and - clear at posterior chest wall. Adventitious sounds:- No Adventitious sounds.   Cardiovascular Auscultation:Rhythm- Regular rate and rhythm. Heart Sounds- S1 WNL and S2 WNL. Murmurs & Other Heart Sounds:Auscultation of the heart reveals - No Murmurs.   Abdomen Palpation/Percussion:Tenderness- Abdomen is non-tender to palpation. Rigidity (guarding)- Abdomen is soft. Auscultation:Auscultation of the abdomen reveals - Bowel sounds normal.   Female Genitourinary  Not done, not pertinent to present illness  Musculoskeletal On exam, she is a well-developed female, alert and oriented, in no apparent distress. Her right knee looks great. Her range is 0-125 with no swelling, tenderness or instability. Left knee varus  deformity. No effusion. Range is 5-120. Moderate crepitus on ROM. Tender medial greater than lateral with no instability noted. Pulses, sensation and motor are intact.  RADIOGRAPHS: AP of both knees and lateral show excellent appearance of the prosthesis in the right knee, no periprosthetic abnormalities. On the left, she has end stage arthritis, medial and patellofemoral, with bone on bone changes of both and with varus deformity.  Assessment & Plan Osteoarthrosis NOS, lower leg (715.96) Impression: Left Knee  Note: Plan is for a left total knee replacement by Dr. Lequita Halt.  Plan is to go home with husband. Patient would like to look into the possibility of getting a home CPM because of the scar tissue that occurred after her previous knee replacement.  PCP - Dr. Lovell Sheehan - Patient has been seen preoperatively and felt to be stable for surgery.  Signed electronically by Roberts Gaudy, PA-C

## 2012-11-30 NOTE — Evaluation (Signed)
Physical Therapy Evaluation Patient Details Name: Morgan [name] MRN: [id number] DOB: 01-Sep-1951 Today's Date: 11/30/2012 Time: 0865-7846 PT Time Calculation (min): 33 min  PT Assessment / Plan / Recommendation Clinical Impression  Pt. is s/p LTKA today/ Pt was dizzy and nauseated but lifting leg Pt. plans  to DC to home. Pt. has DME.    PT Assessment  Patient needs continued PT services    Follow Up Recommendations  Home health PT    Does the patient have the potential to tolerate intense rehabilitation      Barriers to Discharge        Equipment Recommendations  None recommended by PT    Recommendations for Other Services OT consult   Frequency 7X/week    Precautions / Restrictions Precautions Precautions: Knee Required Braces or Orthoses: Knee Immobilizer - Left Knee Immobilizer - Left: Discontinue once straight leg raise with < 10 degree lag Restrictions Weight Bearing Restrictions: No   Pertinent Vitals/Pain 7/ RN   Aware, Ice.      Mobility  Bed Mobility Bed Mobility: Supine to Sit;Sit to Supine Supine to Sit: 4: Min assist Sit to Supine: 4: Min assist Details for Bed Mobility Assistance: min assistance to support LLE.  Transfers Transfers: Sit to Stand;Stand to Sit Sit to Stand: 1: +2 Total assist;From bed Sit to Stand: Patient Percentage: 70% Stand to Sit: 4: Min assist;To bed Details for Transfer Assistance: cues for LLE placement and for hand placement. Ambulation/Gait Ambulation/Gait Assistance: 4: Min assist Ambulation Distance (Feet): 4 Feet Assistive device: Rolling walker Ambulation/Gait Assistance Details: sidesteps along bed. Pt. nauseated and dizzy. returned to bed. Gait Pattern: Step-to pattern;Antalgic    Shoulder Instructions     Exercises Total Joint Exercises Quad Sets: AROM;Left;10 reps;Supine Straight Leg Raises: AAROM;Left;10 reps;Supine   PT Diagnosis: Difficulty walking;Acute pain  PT Problem List: Decreased  strength;Decreased range of motion;Decreased activity tolerance;Decreased balance;Decreased safety awareness;Cardiopulmonary status limiting activity;Decreased knowledge of use of DME;Pain PT Treatment Interventions: DME instruction;Functional mobility training;Therapeutic activities;Therapeutic exercise;Patient/family education   PT Goals Acute Rehab PT Goals PT Goal Formulation: With patient Time For Goal Achievement: 12/07/12 Potential to Achieve Goals: Good Pt will go Sit to Stand: with supervision PT Goal: Sit to Stand - Progress: Goal set today Pt will go Stand to Sit: with supervision PT Goal: Stand to Sit - Progress: Goal set today Pt will Ambulate: 51 - 150 feet;with supervision;with rolling walker PT Goal: Ambulate - Progress: Goal set today Pt will Perform Home Exercise Program: with supervision, verbal cues required/provided PT Goal: Perform Home Exercise Program - Progress: Goal set today  Visit Information  Last PT Received On: 11/30/12 Assistance Needed: +2    Subjective Data  Subjective: i have been in so much pain Patient Stated Goal: to go home   Prior Functioning  Home Living Lives With: Spouse Available Help at Discharge: Family Type of Home: House Home Access: Stairs to enter Home Layout: One level Home Adaptive Equipment: Bedside commode/3-in-1;Walker - rolling Prior Function Level of Independence: Independent Driving: Yes Communication Communication: No difficulties    Cognition  Overall Cognitive Status: Appears within functional limits for tasks assessed/performed Arousal/Alertness: Lethargic Orientation Level: Appears intact for tasks assessed Behavior During Session: Lethargic    Extremity/Trunk Assessment Right Lower Extremity Assessment RLE ROM/Strength/Tone: WFL for tasks assessed RLE Sensation: WFL - Light Touch Left Lower Extremity Assessment LLE ROM/Strength/Tone: Deficits LLE ROM/Strength/Tone Deficits: able to perform SLR with Min  assistance. knee flexion to 30 at best. LLE Sensation: [facility] -  Light Touch Trunk Assessment Trunk Assessment: Normal   Balance    End of Session PT - End of Session Activity Tolerance: Patient limited by fatigue;Patient limited by pain;Treatment limited secondary to medical complications (Comment) (nausea and dizzy.) Patient left: in bed;with call bell/phone within reach;with nursing in room Nurse Communication: Mobility status CPM Left Knee CPM Left Knee: On Left Knee Flexion (Degrees): 40  Left Knee Extension (Degrees): 10   GP     Rada Hay 11/30/2012, 5:52 PM

## 2012-11-30 NOTE — Progress Notes (Signed)
Utilization review completed.  

## 2012-12-01 ENCOUNTER — Encounter (HOSPITAL_COMMUNITY): Payer: Self-pay | Admitting: Orthopedic Surgery

## 2012-12-01 DIAGNOSIS — D62 Acute posthemorrhagic anemia: Secondary | ICD-10-CM | POA: Diagnosis not present

## 2012-12-01 LAB — CBC
MCH: 29.9 pg (ref 26.0–34.0)
MCHC: 32.9 g/dL (ref 30.0–36.0)
MCV: 91 fL (ref 78.0–100.0)
Platelets: 224 10*3/uL (ref 150–400)
RDW: 13 % (ref 11.5–15.5)

## 2012-12-01 LAB — BASIC METABOLIC PANEL
Calcium: 8.8 mg/dL (ref 8.4–10.5)
Creatinine, Ser: 0.58 mg/dL (ref 0.50–1.10)
GFR calc Af Amer: >90 mL/min (ref >90)
GFR calc non Af Amer: >90 mL/min (ref >90)

## 2012-12-01 NOTE — Progress Notes (Signed)
Physical Therapy Treatment Patient Details Name: Morgan [name] MRN: [id number] DOB: March 29, 1951 Today's Date: 12/01/2012 Time: 4098-1191 PT Time Calculation (min): 29 min  PT Assessment / Plan / Recommendation Comments on Treatment Session  POD # 1 pm session.  Amb in hallway increased distance, assisted back to bed then performed some TKR TE's.  Pt stated, "It felt good to sit up for a while". "I can't beleive they are going to send me home tomorrow".    Follow Up Recommendations  Home health PT     Does the patient have the potential to tolerate intense rehabilitation     Barriers to Discharge        Equipment Recommendations  3 in 1 bedside comode;None recommended by PT    Recommendations for Other Services    Frequency 7X/week   Plan Discharge plan remains appropriate    Precautions / Restrictions Precautions Precautions: Knee Precaution Comments: Pt instructed on KI use for amb and no pillow under knee Required Braces or Orthoses: Knee Immobilizer - Left Knee Immobilizer - Left: Discontinue once straight leg raise with < 10 degree lag Restrictions Weight Bearing Restrictions: No Other Position/Activity Restrictions: WBAT   Pertinent Vitals/Pain C/o 5/10 ICE applied    Mobility  Bed Mobility Bed Mobility: Supine to Sit Supine to Sit: 4: Min assist Sitting - Scoot to Edge of Bed: 4: Min assist Details for Bed Mobility Assistance: Min assist to support L LE up onto bed and increased time Transfers Transfers: Sit to Stand;Stand to [employer] to Stand: 4: Min assist;From chair/3-in-1 Stand to Sit: 4: Min assist;To bed Details for Transfer Assistance: 25% VC's on proper tech and hand placement. Pt required increased time Ambulation/Gait Ambulation/Gait Assistance: 4: Min assist Ambulation/Gait: Patient Percentage: 80% Ambulation Distance (Feet): 45 Feet Assistive device: Rolling walker Ambulation/Gait Assistance Details: 25% Vc's on proper sequencing and proper walker  to self distance.  Gait Pattern: Step-to pattern;Decreased stance time - left;Trunk flexed Gait velocity: decreased    Exercises Total Joint Exercises Ankle Circles/Pumps: AROM;Both;10 reps;Supine Quad Sets: AROM;Both;10 reps;Supine Gluteal Sets: AROM;Both;10 reps;Supine Hip ABduction/ADduction: AAROM;Left;10 reps;Supine Straight Leg Raises: AAROM;Left;10 reps;Supine    PT Goals                                                            progressing    Visit Information  Last PT Received On: 12/01/12 Assistance Needed: +1    Subjective Data      Cognition  Overall Cognitive Status: Appears within functional limits for tasks assessed/performed Arousal/Alertness: Awake/alert Orientation Level: Appears intact for tasks assessed Behavior During Session: [facility] for tasks performed    Balance  Balance Balance Assessed: Yes Dynamic Standing Balance Dynamic Standing - Level of Assistance: 4: Min assist  End of Session PT - End of Session Equipment Utilized During Treatment: Gait belt Activity Tolerance: Patient tolerated treatment well Patient left: in bed;with call bell/phone within reach;with family/visitor present (ICE to L knee) Nurse Communication: Other (comment) (Pt ready for CPM)   Felecia Shelling  PTA [facility]  Acute  Rehab Pager     [phone]

## 2012-12-01 NOTE — Evaluation (Signed)
Occupational Therapy Evaluation Patient Details Name: Morgan [name] MRN: [id number] DOB: 04-22-1951 Today's Date: 12/01/2012 Time: 4098-1191 OT Time Calculation (min): 20 min  OT Assessment / Plan / Recommendation Clinical Impression  Pt is s/p L TKA and displays some pain, a little "woozy" feeling with activity but is moving fairly well today. She will benefit from skilled OT services to improve her independence with self care tasks for discharge home with family.     OT Assessment  Patient needs continued OT Services    Follow Up Recommendations  No OT follow up;Supervision/Assistance - 24 hour    Barriers to Discharge      Equipment Recommendations  3 in 1 bedside comode;None recommended by PT    Recommendations for Other Services    Frequency  Min 2X/week    Precautions / Restrictions Precautions Precautions: Knee Required Braces or Orthoses: Knee Immobilizer - Left Knee Immobilizer - Left: Discontinue once straight leg raise with < 10 degree lag Restrictions Weight Bearing Restrictions: No        ADL  Eating/Feeding: Simulated;Independent Where Assessed - Eating/Feeding: Chair Grooming: Simulated;Wash/dry hands;Set up Where Assessed - Grooming: Supported sitting Upper Body Bathing: Simulated;Chest;Right arm;Left arm;Abdomen;Set up Where Assessed - Upper Body Bathing: Unsupported sitting Lower Body Bathing: Simulated;Minimal assistance Where Assessed - Lower Body Bathing: Supported sit to stand Upper Body Dressing: Simulated;Set up Where Assessed - Upper Body Dressing: Unsupported sitting Lower Body Dressing: Simulated;Minimal assistance Where Assessed - Lower Body Dressing: Supported sit to stand Toilet Transfer: Simulated;Minimal assistance Toilet Transfer Method: Sit to stand Toileting - Architect and Hygiene: Simulated;Minimal assistance Where Assessed - Engineer, mining and Hygiene: Sit to stand from 3-in-1 or toilet Tub/Shower  Transfer Method: Not assessed Equipment Used: Rolling walker ADL Comments: Pt educated on how to don KI and when to wear. Pt has a tubbench and 3in1 but they are in attic space. she is hoping to get family to get it out for her. She would like a new 3in1 but states she may sponge initially versus see if she can get tubbench out of attic also. Pt able to reach to toes on bilateral foot.    OT Diagnosis: Generalized weakness  OT Problem List: Decreased strength;Decreased activity tolerance;Decreased knowledge of use of DME or AE OT Treatment Interventions: Self-care/ADL training;Therapeutic activities;DME and/or AE instruction;Patient/family education   OT Goals Acute Rehab OT Goals OT Goal Formulation: With patient Time For Goal Achievement: 12/08/12 Potential to Achieve Goals: Good ADL Goals Pt Will Perform Grooming: with supervision;Standing at sink ADL Goal: Grooming - Progress: Goal set today Pt Will Transfer to Toilet: with supervision;Ambulation;with DME;3-in-1 ADL Goal: Toilet Transfer - Progress: Goal set today Pt Will Perform Toileting - Clothing Manipulation: with supervision;Standing ADL Goal: Toileting - Clothing Manipulation - Progress: Goal set today  Visit Information  Last OT Received On: 12/01/12 Assistance Needed: +1    Subjective Data  Subjective: I just feel so woozy and sleepy Patient Stated Goal: to be able to return home when able   Prior Functioning     Home Living Lives With: Spouse Available Help at Discharge: Family Type of Home: House Home Access: Stairs to enter Home Layout: One level Bathroom Shower/Tub: Engineer, manufacturing systems: Standard Home Adaptive Equipment: Bedside commode/3-in-1;Walker - rolling;Tub transfer bench Additional Comments: pt would like a new 3in1. states hers is in the attic and old.  Prior Function Level of Independence: Independent Driving: Yes Communication Communication: No difficulties  Vision/Perception     Cognition  Overall Cognitive Status: Appears within functional limits for tasks assessed/performed Arousal/Alertness: Awake/alert Orientation Level: Appears intact for tasks assessed Behavior During Session: [facility] for tasks performed    Extremity/Trunk Assessment Right Upper Extremity Assessment RUE ROM/Strength/Tone: [facility] for tasks assessed Left Upper Extremity Assessment LUE ROM/Strength/Tone: WFL for tasks assessed     Mobility Transfers Transfers: Sit to Stand;Stand to Sit Sit to Stand: 4: Min assist;With upper extremity assist;From chair/3-in-1 Stand to Sit: 4: Min assist;With upper extremity assist;To chair/3-in-1 Details for Transfer Assistance: min cues for hand placement and LE management     Shoulder Instructions     Exercise     Balance Balance Balance Assessed: Yes Dynamic Standing Balance Dynamic Standing - Level of Assistance: 4: Min assist   End of Session OT - End of Session Activity Tolerance: Other (comment) (pt feels alittle woozy) Patient left: in chair;with call bell/phone within reach CPM Left Knee CPM Left Knee: Off  GO     Lennox Laity [phone] 12/01/2012, 11:52 AM

## 2012-12-01 NOTE — Progress Notes (Signed)
   Subjective: 1 Day Post-Op Procedure(s) (LRB): TOTAL KNEE ARTHROPLASTY (Left) Patient reports pain as moderate and severe last night.  Some better this morning. Patient seen in rounds with Dr. Lequita Halt. Patient is well, but has had some minor complaints of pain in the knee, requiring pain medications We will start therapy today.  Plan is to go Home after hospital stay.  Objective: Vital signs in last 24 hours: Temp:  [96.6 F (35.9 C)-99.5 F (37.5 C)] 99.5 F (37.5 C) (12/03 0647) Pulse Rate:  [54-87] 87  (12/03 0647) Resp:  [13-18] 15  (12/03 0800) BP: (96-153)/(47-80) 149/78 mmHg (12/03 0647) SpO2:  [98 %-100 %] 99 % (12/03 0647) Weight:  [71.215 kg (157 lb)] 71.215 kg (157 lb) (12/02 1105)  Intake/Output from previous day:  Intake/Output Summary (Last 24 hours) at 12/01/12 0820 Last data filed at 12/01/12 0200  Gross per 24 hour  Intake   3900 ml  Output   2725 ml  Net   1175 ml    Intake/Output this shift: UOP 225 +1175  Labs:  [facility] 12/01/12 0452  HGB 10.6*    Basename 12/01/12 0452  WBC 11.2*  RBC 3.54*  HCT 32.2*  PLT 224    Basename 12/01/12 0452  NA 136  K 4.1  CL 100  CO2 27  BUN 9  CREATININE 0.58  GLUCOSE 136*  CALCIUM 8.8   No results found for this basename: LABPT:2,INR:2 in the last 72 hours  EXAM General - Patient is Alert, Appropriate and Oriented Extremity - Neurovascular intact Sensation intact distally Dorsiflexion/Plantar flexion intact Dressing - dressing C/D/I Motor Function - intact, moving foot and toes well on exam.  Hemovac pulled without difficulty.  Past Medical History  Diagnosis Date  . Thyroid disease   . Arthritis   . Allergy   . History of colonic polyps   . Mitral regurgitation   . Diastolic dysfunction     neg nuclear stress 08/09/12  EPIC  . Elevated cholesterol   . Depression   . Upper respiratory infection 11/08/12    fever  with sinus congestion-   states improved  . OSA (obstructive sleep  apnea)     no cpap-  per patient "mild " per study 4 years ago  . Tinnitus     Assessment/Plan: 1 Day Post-Op Procedure(s) (LRB): TOTAL KNEE ARTHROPLASTY (Left) Principal Problem:  *OA (osteoarthritis) of knee Active Problems:  Postop Acute blood loss anemia  Estimated Body mass index is 27.81 kg/(m^2) as calculated from the following:   Height as of this encounter: 5\' 3" (1.6 m).   Weight as of this encounter: 157 lb(71.215 kg). Advance diet Up with therapy Plan for discharge tomorrow Discharge home with home health  DVT Prophylaxis - Xarelto Weight-Bearing as tolerated to left leg No vaccines. D/C O2 and Pulse OX and try on Room Air  Morgan [name] 12/01/2012, 8:20 AM

## 2012-12-01 NOTE — Care Management Note (Addendum)
    Page 1 of 2   12/02/2012     6:55:18 PM   CARE MANAGEMENT NOTE 12/02/2012  Patient:  [facility]   Account Number:  [id number]  Date Initiated:  12/01/2012  Documentation initiated by:  Colleen Can  Subjective/Objective Assessment:   dx osteoarthritis left knee total knee replacement   Referral to Genevieve Norlander from MD office for [facility] services     Action/Plan:   Home with [facility] services. Cm will follow for needs.  Pt will need 3N1   Anticipated DC Date:  12/03/2012   Anticipated DC Plan:  HOME W HOME HEALTH SERVICES  In-house referral  NA      DC Planning Services  CM consult      PAC Choice  DURABLE MEDICAL EQUIPMENT  HOME HEALTH   Choice offered to / List presented to:  C-1 Patient   DME arranged  3-N-1      DME agency  [facility]     HH arranged  HH-2 PT      [facility] agency  [facility]   Status of service:  Completed, signed off Medicare Important Message given?  NO (If response is "NO", the following Medicare IM given date fields will be blank) Date Medicare IM given:   Date Additional Medicare IM given:    Discharge Disposition:  HOME W HOME HEALTH SERVICES  Per UR Regulation:    If discussed at Long Length of Stay Meetings, dates discussed:    Comments:  12/02/2012 Colleen Can BSN RN CCM [phone] Pt for discharge. 3n1! has been delivered to patient's room. Genevieve Norlander will start services tomorrow 12/02/2012.  12/01/2012 Colleen Can BSN RN CCM [phone] Genevieve Norlander will provide HHpt with start date of day after pt is discharged. [facility] has been notified of  DME need.

## 2012-12-01 NOTE — Progress Notes (Signed)
Physical Therapy Treatment Patient Details Name: Morgan [name] MRN: [id number] DOB: 04-15-1951 Today's Date: 12/01/2012 Time: 9562-1308 PT Time Calculation (min): 25 min  PT Assessment / Plan / Recommendation Comments on Treatment Session  POD # 1 am session L TKR.  Instructed pt on KI use for amb.  Assisted pt OOB to amb limited distance 2nd mild c/o dizyyness and max c/o fatigue stating "poor sleep last night".  Positioned in recliner and applied ICE.  Pt plans to D/C to home.    Follow Up Recommendations  Home health PT     Does the patient have the potential to tolerate intense rehabilitation     Barriers to Discharge        Equipment Recommendations  3 in 1 bedside comode;None recommended by PT    Recommendations for Other Services    Frequency 7X/week   Plan Discharge plan remains appropriate    Precautions / Restrictions Precautions Precautions: Knee Precaution Comments: Pt instructed on KI use for amb and no pillow under knee Required Braces or Orthoses: Knee Immobilizer - Left Knee Immobilizer - Left: Discontinue once straight leg raise with < 10 degree lag Restrictions Weight Bearing Restrictions: No Other Position/Activity Restrictions: WBAT   Pertinent Vitals/Pain C/o 3/10 L knee pain and MAX c/o fatigue ICE applied to L knee    Mobility  Bed Mobility Bed Mobility: Supine to Sit;Sitting - Scoot to Edge of Bed Supine to Sit: 4: Min assist Sitting - Scoot to [place] of Bed: 4: Min assist Details for Bed Mobility Assistance: Min assist to support L LE and increased time Transfers Transfers: Sit to Stand;Stand to Sit Sit to Stand: 4: Min assist;From bed Stand to Sit: 4: Min assist;To chair/3-in-1 Details for Transfer Assistance: 25% VC's on proper tech and hand placement.  Pt required increased time. Ambulation/Gait Ambulation/Gait Assistance: 1: +2 Total assist Ambulation/Gait: Patient Percentage: 80% Ambulation Distance (Feet): 22 Feet Assistive device:  Rolling walker Ambulation/Gait Assistance Details: 25% Vc's on proper sequencing and proper walker to self distance.  Pt required increased time due to mild c/o fatigue and poor sleep last night. Gait Pattern: Step-to pattern;Decreased stance time - left Gait velocity: decreased    Exercises Total Joint Exercises Ankle Circles/Pumps: AROM;Both;Supine;10 reps Quad Sets: AROM;Both;Supine;10 reps Gluteal Sets: AROM;Both;Supine;10 reps   PT Goals                                                            progressing    Visit Information  Last PT Received On: 12/01/12 Assistance Needed: +1    Subjective Data      Cognition  Overall Cognitive Status: Appears within functional limits for tasks assessed/performed Arousal/Alertness: Awake/alert Orientation Level: Appears intact for tasks assessed Behavior During Session: [facility] for tasks performed    Balance  Balance Balance Assessed: Yes Dynamic Standing Balance Dynamic Standing - Level of Assistance: 4: Min assist  End of Session PT - End of Session Equipment Utilized During Treatment: Gait belt Activity Tolerance: Patient limited by fatigue Patient left: in chair;with call bell/phone within reach (ICE to L knee)   Felecia Shelling  PTA [facility]  Acute  Rehab Pager     [phone]

## 2012-12-02 LAB — CBC
HCT: 30.5 % — ABNORMAL LOW (ref 36.0–46.0)
Hemoglobin: 10 g/dL — ABNORMAL LOW (ref 12.0–15.0)
MCH: 30 pg (ref 26.0–34.0)
MCHC: 32.8 g/dL (ref 30.0–36.0)
MCV: 91.6 fL (ref 78.0–100.0)
Platelets: 232 10*3/uL (ref 150–400)
RBC: 3.33 MIL/uL — ABNORMAL LOW (ref 3.87–5.11)
RDW: 13.3 % (ref 11.5–15.5)
WBC: 14.4 10*3/uL — ABNORMAL HIGH (ref 4.0–10.5)

## 2012-12-02 LAB — BASIC METABOLIC PANEL
CO2: 28 mEq/L (ref 19–32)
Chloride: 103 mEq/L (ref 96–112)
Creatinine, Ser: 0.6 mg/dL (ref 0.50–1.10)

## 2012-12-02 MED ORDER — TRAMADOL HCL 50 MG PO TABS: 50.0000 mg | ORAL_TABLET | Freq: Four times a day (QID) | ORAL | Status: DC | PRN

## 2012-12-02 MED ORDER — METHOCARBAMOL 500 MG PO TABS: 500.0000 mg | ORAL_TABLET | Freq: Four times a day (QID) | ORAL | Status: DC | PRN

## 2012-12-02 MED ORDER — OXYCODONE HCL 5 MG PO TABS: 5.0000 mg | ORAL_TABLET | ORAL | Status: DC | PRN

## 2012-12-02 MED ORDER — RIVAROXABAN 10 MG PO TABS: 10.0000 mg | ORAL_TABLET | Freq: Every day | ORAL | Status: DC

## 2012-12-02 MED ORDER — OXYCODONE HCL 5 MG PO TABS: 2.5000 mg | ORAL_TABLET | ORAL | Status: DC | PRN

## 2012-12-02 NOTE — Progress Notes (Signed)
Physical Therapy Treatment Patient Details Name: Morgan [name] MRN: [id number] DOB: [birth year] Today's Date: 12/02/2012 Time: 4098-1191 PT Time Calculation (min): 41 min  PT Assessment / Plan / Recommendation Comments on Treatment Session  POD # 2 am session L TKR.  Assisted pt OOB to BR then amb in hallway.  Performed TKR TE's then applied ICE to L knee.  Pt plans to D/C to home later today.    Follow Up Recommendations  Home health PT     Does the patient have the potential to tolerate intense rehabilitation     Barriers to Discharge        Equipment Recommendations  3 in 1 bedside comode;Other (comment) (CPM, 3:1 both will be delivered to house)    Recommendations for Other Services    Frequency 7X/week   Plan Discharge plan remains appropriate    Precautions / Restrictions Precautions Precautions: Knee Precaution Comments: Pt instructed on KI use for amb and no pillow under knee  Required Braces or Orthoses: Knee Immobilizer - Left Knee Immobilizer - Left: Discontinue once straight leg raise with < 10 degree lag Restrictions Weight Bearing Restrictions: No Other Position/Activity Restrictions: WBAT   Pertinent Vitals/Pain C/o 5/10 during TE's ICE applied    Mobility  Bed Mobility Bed Mobility: Supine to Sit Supine to Sit: 4: Min guard Details for Bed Mobility Assistance: min guard assist and increased time Transfers Transfers: Sit to Stand;Stand to Sit Sit to Stand: 4: Min guard;From bed;From toilet Stand to Sit: 4: Min guard;To toilet;To chair/3-in-1 Details for Transfer Assistance: 25% VC's on proper tech and hand placement. Pt required increased time  Ambulation/Gait Ambulation/Gait Assistance: 4: Min guard Ambulation Distance (Feet): 125 Feet Assistive device: Rolling walker Ambulation/Gait Assistance Details: <25% VC's on safety with turns and backward gait sequencing Gait Pattern: Step-to pattern;Decreased stance time - left;Trunk flexed Gait velocity:  decreased    Exercises Total Joint Exercises Ankle Circles/Pumps: AROM;Both;10 reps;Supine Quad Sets: AROM;Both;10 reps;Supine Gluteal Sets: AROM;Both;10 reps;Supine Towel Squeeze: AROM;Both;10 reps;Supine Short Arc Quad: AAROM;Left;10 reps;Supine Heel Slides: AAROM;Left;10 reps;Supine Hip ABduction/ADduction: AAROM;Left;10 reps;Supine Straight Leg Raises: AAROM;Left;10 reps;Supine    PT Goals                               progressing   Visit Information  Last PT Received On: 12/02/12 Assistance Needed: +1    Subjective Data  Subjective: I slept better last night Patient Stated Goal: home   Cognition       Balance     End of Session PT - End of Session Equipment Utilized During Treatment: Gait belt Activity Tolerance: Patient tolerated treatment well Patient left: in chair;with call bell/phone within reach (ICE to L knee)   Felecia Shelling  PTA [facility]  Acute  Rehab Pager     [phone]

## 2012-12-02 NOTE — Progress Notes (Signed)
   Subjective: 2 Days Post-Op Procedure(s) (LRB): TOTAL KNEE ARTHROPLASTY (Left) Patient reports pain as mild.   Patient seen in rounds for Dr. Lequita Halt. Felt some grogginess with the strong Oxycodone. Will try Ultram but can reduce the dosage of the Oxy also.  Plan is to go home later today after therapy. Patient is well, and has had no acute complaints or problems Patient is ready to go home later today.  Objective: Vital signs in last 24 hours: Temp:  [97.1 F (36.2 C)-98.8 F (37.1 C)] 98.8 F (37.1 C) (12/04 0540) Pulse Rate:  [76-89] 89  (12/04 0540) Resp:  [15-20] 18  (12/04 0540) BP: (140-151)/(72-82) 151/82 mmHg (12/04 0540) SpO2:  [87 %-96 %] 96 % (12/04 0540)  Intake/Output from previous day:  Intake/Output Summary (Last 24 hours) at 12/02/12 0811 Last data filed at 12/02/12 0248  Gross per 24 hour  Intake    600 ml  Output   2500 ml  Net  -1900 ml    Intake/Output this shift:    Labs:  Basename 12/02/12 0452 12/01/12 0452  HGB 10.0* 10.6*    Basename 12/02/12 0452 12/01/12 0452  WBC 14.4* 11.2*  RBC 3.33* 3.54*  HCT 30.5* 32.2*  PLT 232 224    Basename 12/02/12 0452 12/01/12 0452  NA 140 136  K 3.8 4.1  CL 103 100  CO2 28 27  BUN 10 9  CREATININE 0.60 0.58  GLUCOSE 157* 136*  CALCIUM 9.2 8.8   No results found for this basename: LABPT:2,INR:2 in the last 72 hours  EXAM: General - Patient is Alert, Appropriate and Oriented Extremity - Neurovascular intact Sensation intact distally Dorsiflexion/Plantar flexion intact No cellulitis present Incision - clean, dry, no drainage, healing Motor Function - intact, moving foot and toes well on exam.   Assessment/Plan: 2 Days Post-Op Procedure(s) (LRB): TOTAL KNEE ARTHROPLASTY (Left) Procedure(s) (LRB): TOTAL KNEE ARTHROPLASTY (Left) Past Medical History  Diagnosis Date  . Thyroid disease   . Arthritis   . Allergy   . History of colonic polyps   . Mitral regurgitation   . Diastolic  dysfunction     neg nuclear stress 08/09/12  EPIC  . Elevated cholesterol   . Depression   . Upper respiratory infection 11/08/12    fever  with sinus congestion-   states improved  . OSA (obstructive sleep apnea)     no cpap-  per patient "mild " per study 4 years ago  . Tinnitus    Principal Problem:  *OA (osteoarthritis) of knee Active Problems:  Postop Acute blood loss anemia  Estimated Body mass index is 27.81 kg/(m^2) as calculated from the following:   Height as of this encounter: 5\' 3" (1.6 m).   Weight as of this encounter: 157 lb(71.215 kg). Up with therapy Discharge home with home health Diet - Cardiac diet Follow up - in 2 weeks Activity - WBAT Disposition - Home Condition Upon Discharge - Good D/C Meds - See DC Summary DVT Prophylaxis - Xarelto  PERKINS, ALEXZANDREW 12/02/2012, 8:11 AM

## 2012-12-02 NOTE — Discharge Summary (Signed)
Physician Discharge Summary   Patient ID: Morgan [name] MRN: [id number] DOB/AGE: 06/13/1951 61 y.o.  Admit date: 11/30/2012 Discharge date: 12/02/2012  Primary Diagnosis: Osteoarthritis Left knee   Admission Diagnoses:  Past Medical History  Diagnosis Date  . Thyroid disease   . Arthritis   . Allergy   . History of colonic polyps   . Mitral regurgitation   . Diastolic dysfunction     neg nuclear stress 08/09/12  EPIC  . Elevated cholesterol   . Depression   . Upper respiratory infection 11/08/12    fever  with sinus congestion-   states improved  . OSA (obstructive sleep apnea)     no cpap-  per patient "mild " per study 4 years ago  . Tinnitus    Discharge Diagnoses:   Principal Problem:  *OA (osteoarthritis) of knee Active Problems:  Postop Acute blood loss anemia  Estimated Body mass index is 27.81 kg/(m^2) as calculated from the following:   Height as of this encounter: 5\' 3" (1.6 m).   Weight as of this encounter: 157 lb(71.215 kg).  Classification of overweight in adults according to BMI (WHO, 1998)   Procedure:  Procedure(s) (LRB): TOTAL KNEE ARTHROPLASTY (Left)   Consults: None  HPI: Morgan [name] is a 61 y.o. year old female with end stage OA of her left knee with progressively worsening pain and dysfunction. She has constant pain, with activity and at rest and significant functional deficits with difficulties even with ADLs. She has had extensive non-op management including analgesics, injections of cortisone and viscosupplements, and home exercise program, but remains in significant pain with significant dysfunction. Radiographs show bone on bone arthritis medial and patellofemoral with varus deformity. She presents now for left Total Knee Arthroplasty.   Laboratory Data: Admission on 11/30/2012  Component Date Value Range Status  . ABO/RH(D) 11/30/2012 A POS   Final  . Antibody Screen 11/30/2012 NEG   Final  . Sample Expiration 11/30/2012 12/03/2012    Final  . WBC 12/01/2012 11.2* 4.0 - 10.5 K/uL Final  . RBC 12/01/2012 3.54* 3.87 - 5.11 MIL/uL Final  . Hemoglobin 12/01/2012 10.6* 12.0 - 15.0 g/dL Final  . HCT 78/29/5621 32.2* 36.0 - 46.0 % Final  . MCV 12/01/2012 91.0  78.0 - 100.0 fL Final  . MCH 12/01/2012 29.9  26.0 - 34.0 pg Final  . MCHC 12/01/2012 32.9  30.0 - 36.0 g/dL Final  . RDW 30/86/5784 13.0  11.5 - 15.5 % Final  . Platelets 12/01/2012 224  150 - 400 K/uL Final  . Sodium 12/01/2012 136  135 - 145 mEq/L Final  . Potassium 12/01/2012 4.1  3.5 - 5.1 mEq/L Final  . Chloride 12/01/2012 100  96 - 112 mEq/L Final  . CO2 12/01/2012 27  19 - 32 mEq/L Final  . Glucose, Bld 12/01/2012 136* 70 - 99 mg/dL Final  . BUN 69/62/9528 9  6 - 23 mg/dL Final  . Creatinine, Ser 12/01/2012 0.58  0.50 - 1.10 mg/dL Final  . Calcium 41/32/4401 8.8  8.4 - 10.5 mg/dL Final  . GFR calc non Af Amer 12/01/2012 >90  >90 mL/min Final  . GFR calc Af Amer 12/01/2012 >90  >90 mL/min Final   Comment:                                 The eGFR has been calculated  using the CKD EPI equation.                          This calculation has not been                          validated in all clinical                          situations.                          eGFR's persistently                          <90 mL/min signify                          possible Chronic Kidney Disease.  . WBC 12/02/2012 14.4* 4.0 - 10.5 K/uL Final  . RBC 12/02/2012 3.33* 3.87 - 5.11 MIL/uL Final  . Hemoglobin 12/02/2012 10.0* 12.0 - 15.0 g/dL Final  . HCT 16/09/9603 30.5* 36.0 - 46.0 % Final  . MCV 12/02/2012 91.6  78.0 - 100.0 fL Final  . MCH 12/02/2012 30.0  26.0 - 34.0 pg Final  . MCHC 12/02/2012 32.8  30.0 - 36.0 g/dL Final  . RDW 54/08/8118 13.3  11.5 - 15.5 % Final  . Platelets 12/02/2012 232  150 - 400 K/uL Final  . Sodium 12/02/2012 140  135 - 145 mEq/L Final  . Potassium 12/02/2012 3.8  3.5 - 5.1 mEq/L Final  . Chloride 12/02/2012 103  96 -  112 mEq/L Final  . CO2 12/02/2012 28  19 - 32 mEq/L Final  . Glucose, Bld 12/02/2012 157* 70 - 99 mg/dL Final  . BUN 14/78/2956 10  6 - 23 mg/dL Final  . Creatinine, Ser 12/02/2012 0.60  0.50 - 1.10 mg/dL Final  . Calcium 21/30/8657 9.2  8.4 - 10.5 mg/dL Final  . GFR calc non Af Amer 12/02/2012 >90  >90 mL/min Final  . GFR calc Af Amer 12/02/2012 >90  >90 mL/min Final   Comment:                                 The eGFR has been calculated                          using the CKD EPI equation.                          This calculation has not been                          validated in all clinical                          situations.                          eGFR's persistently                          <90 mL/min signify  possible Chronic Kidney Disease.  Hospital Outpatient Visit on 11/20/2012  Component Date Value Range Status  . aPTT 11/20/2012 31  24 - 37 seconds Final  . WBC 11/20/2012 8.0  4.0 - 10.5 K/uL Final  . RBC 11/20/2012 4.43  3.87 - 5.11 MIL/uL Final  . Hemoglobin 11/20/2012 13.2  12.0 - 15.0 g/dL Final  . HCT 16/09/9603 40.5  36.0 - 46.0 % Final  . MCV 11/20/2012 91.4  78.0 - 100.0 fL Final  . MCH 11/20/2012 29.8  26.0 - 34.0 pg Final  . MCHC 11/20/2012 32.6  30.0 - 36.0 g/dL Final  . RDW 54/08/8118 13.0  11.5 - 15.5 % Final  . Platelets 11/20/2012 373  150 - 400 K/uL Final  . Sodium 11/20/2012 140  135 - 145 mEq/L Final  . Potassium 11/20/2012 4.0  3.5 - 5.1 mEq/L Final  . Chloride 11/20/2012 104  96 - 112 mEq/L Final  . CO2 11/20/2012 27  19 - 32 mEq/L Final  . Glucose, Bld 11/20/2012 88  70 - 99 mg/dL Final  . BUN 14/78/2956 17  6 - 23 mg/dL Final  . Creatinine, Ser 11/20/2012 0.67  0.50 - 1.10 mg/dL Final  . Calcium 21/30/8657 9.8  8.4 - 10.5 mg/dL Final  . Total Protein 11/20/2012 7.4  6.0 - 8.3 g/dL Final  . Albumin 84/69/6295 3.8  3.5 - 5.2 g/dL Final  . AST 28/41/3244 20  0 - 37 U/L Final  . ALT 11/20/2012 16  0 - 35 U/L Final  .  Alkaline Phosphatase 11/20/2012 92  39 - 117 U/L Final  . Total Bilirubin 11/20/2012 0.3  0.3 - 1.2 mg/dL Final  . GFR calc non Af Amer 11/20/2012 >90  >90 mL/min Final  . GFR calc Af Amer 11/20/2012 >90  >90 mL/min Final   Comment:                                 The eGFR has been calculated                          using the CKD EPI equation.                          This calculation has not been                          validated in all clinical                          situations.                          eGFR's persistently                          <90 mL/min signify                          possible Chronic Kidney Disease.  [name] Prothrombin Time 11/20/2012 12.5  11.6 - 15.2 seconds Final  . INR 11/20/2012 0.94  0.00 - 1.49 Final  . Color, Urine 11/20/2012 YELLOW  YELLOW Final  . APPearance 11/20/2012 CLEAR  CLEAR Final  . Specific Gravity, Urine 11/20/2012 1.011  1.005 - 1.030  Final  . pH 11/20/2012 6.5  5.0 - 8.0 Final  . Glucose, UA 11/20/2012 NEGATIVE  NEGATIVE mg/dL Final  . Hgb urine dipstick 11/20/2012 NEGATIVE  NEGATIVE Final  . Bilirubin Urine 11/20/2012 NEGATIVE  NEGATIVE Final  . Ketones, ur 11/20/2012 NEGATIVE  NEGATIVE mg/dL Final  . Protein, ur 21/30/8657 NEGATIVE  NEGATIVE mg/dL Final  . Urobilinogen, UA 11/20/2012 0.2  0.0 - 1.0 mg/dL Final  . Nitrite 84/69/6295 NEGATIVE  NEGATIVE Final  . Leukocytes, UA 11/20/2012 NEGATIVE  NEGATIVE Final   MICROSCOPIC NOT DONE ON URINES WITH NEGATIVE PROTEIN, BLOOD, LEUKOCYTES, NITRITE, OR GLUCOSE <1000 mg/dL.  [name] MRSA, PCR 11/20/2012 NEGATIVE  NEGATIVE Final  . Staphylococcus aureus 11/20/2012 NEGATIVE  NEGATIVE Final   Comment:                                 The Xpert SA Assay (FDA                          approved for NASAL specimens                          in patients over 56 years of age),                          is one component of                          a comprehensive surveillance                          program.   Test performance has                          been validated by [employer] for patients greater                          than or equal to 60 year old.                          It is not intended                          to diagnose infection nor to                          guide or monitor treatment.     X-Rays:No results found.  EKG: Orders placed in visit on 11/20/12  . EKG 12-LEAD     Hospital Course: Morgan [name] is a 61 y.o. who was admitted to [facility]. They were brought to the operating room on 11/30/2012 and underwent Procedure(s): TOTAL KNEE ARTHROPLASTY.  Patient tolerated the procedure well and was later transferred to the recovery room and then to the orthopaedic floor for postoperative care.  They were given PO and IV analgesics for pain control following their surgery.  They were given 24 hours of postoperative antibiotics of  Anti-infectives     Start  Dose/Rate Route Frequency Ordered Stop   11/30/12 1430   ceFAZolin (ANCEF) IVPB 1 g/50 mL premix        1 g 100 mL/hr over 30 Minutes Intravenous Every 6 hours 11/30/12 1312 11/30/12 2122   11/30/12 0628   ceFAZolin (ANCEF) IVPB 2 g/50 mL premix        2 g 100 mL/hr over 30 Minutes Intravenous 60 min pre-op 11/30/12 1610 11/30/12 0812         and started on DVT prophylaxis in the form of Xarelto.   PT and OT were ordered for total joint protocol.  Discharge planning consulted to help with postop disposition and equipment needs.  Patient had a rough night on the evening of surgery with pain but started to get up OOB with therapy on day one and walked over 40 feet.  Hemovac drain was pulled without difficulty.  Continued to work with therapy into day two.  Felt some grogginess with the strong Oxycodone. Tried Ultram but and reduced the dosage of the Oxy also. Dressing was changed on day two and the incision was healing well. She did well with therapy walking over 125 feet twice.  Patient was seen in rounds and was ready to go home later that afternoon.   Discharge Medications: Prior to Admission medications   Medication Sig Start Date End Date Taking? Authorizing Provider  atorvastatin (LIPITOR) 10 MG tablet Take 10 mg by mouth every evening.   Yes Historical Provider, MD  DULoxetine (CYMBALTA) 30 MG capsule Take 30 mg by mouth every evening.   Yes Historical Provider, MD  levothyroxine (SYNTHROID, LEVOTHROID) 88 MCG tablet Take 88 mcg by mouth daily before breakfast.   Yes Historical Provider, MD  mometasone (NASONEX) 50 MCG/ACT nasal spray Place 2 sprays into the nose as needed. 11/29/11  Yes Stacie Glaze, MD  methocarbamol (ROBAXIN) 500 MG tablet Take 1 tablet (500 mg total) by mouth every 6 (six) hours as needed. 12/02/12   Ambur Province, PA  oxyCODONE (OXY IR/ROXICODONE) 5 MG immediate release tablet Take 0.5-1 tablets (2.5-5 mg total) by mouth every 3 (three) hours as needed. 12/02/12   Renne Platts Julien Girt, PA  rivaroxaban (XARELTO) 10 MG TABS tablet Take 1 tablet (10 mg total) by mouth daily with breakfast. Take Xarelto for two and a half more weeks, then discontinue Xarelto. 12/02/12   Indonesia Mckeough Julien Girt, PA  traMADol (ULTRAM) 50 MG tablet Take 1-2 tablets (50-100 mg total) by mouth every 6 (six) hours as needed. 12/02/12   Khalidah Herbold, PA    Diet: Cardiac diet Activity:WBAT Follow-up: in 2 weeks Disposition - Home Discharged Condition: good   Discharge Orders    Future Orders Please Complete By Expires   Diet - low sodium heart healthy      Call MD / Call 911      Comments:   If you experience chest pain or shortness of breath, CALL 911 and be transported to the hospital emergency room.  If you develope a fever above 101 F, pus (white drainage) or increased drainage or redness at the wound, or calf pain, call your surgeon's office.   Discharge instructions      Comments:   Pick up stool softner and laxative for home. Do not submerge  incision under water. May shower. Continue to use ice for pain and swelling from surgery.  Take Xarelto for two and a half more weeks, then discontinue Xarelto.   Constipation Prevention      Comments:  Drink plenty of fluids.  Prune juice may be helpful.  You may use a stool softener, such as Colace (over the counter) 100 mg twice a day.  Use MiraLax (over the counter) for constipation as needed.   Increase activity slowly as tolerated      Patient may shower      Comments:   You may shower without a dressing once there is no drainage.  Do not wash over the wound.  If drainage remains, do not shower until drainage stops.   Weight bearing as tolerated      Driving restrictions      Comments:   No driving until released by the physician.   Lifting restrictions      Comments:   No lifting until released by the physician.   TED hose      Comments:   Use stockings (TED hose) for 3 weeks on both leg(s).  You may remove them at night for sleeping.   Change dressing      Comments:   Change dressing daily with sterile 4 x 4 inch gauze dressing and apply TED hose. Do not submerge the incision under water.   Do not put a pillow under the knee. Place it under the heel.      Do not sit on low chairs, stoools or toilet seats, as it may be difficult to get up from low surfaces          Medication List     As of 12/02/2012  8:17 AM    STOP taking these medications         amoxicillin 500 MG tablet   Commonly known as: AMOXIL      amoxicillin-clavulanate 875-125 MG per tablet   Commonly known as: AUGMENTIN      calcium citrate-vitamin D 315-200 MG-UNIT per tablet   Commonly known as: CITRACAL+D      fish oil-omega-3 fatty acids 1000 MG capsule      multivitamin with minerals Tabs      TAKE these medications         atorvastatin 10 MG tablet   Commonly known as: LIPITOR   Take 10 mg by mouth every evening.      DULoxetine 30 MG capsule   Commonly known as: CYMBALTA   Take 30  mg by mouth every evening.      levothyroxine 88 MCG tablet   Commonly known as: SYNTHROID, LEVOTHROID   Take 88 mcg by mouth daily before breakfast.      methocarbamol 500 MG tablet   Commonly known as: ROBAXIN   Take 1 tablet (500 mg total) by mouth every 6 (six) hours as needed.      mometasone 50 MCG/ACT nasal spray   Commonly known as: NASONEX   Place 2 sprays into the nose as needed.      oxyCODONE 5 MG immediate release tablet   Commonly known as: Oxy IR/ROXICODONE   Take 0.5-1 tablets (2.5-5 mg total) by mouth every 3 (three) hours as needed.      rivaroxaban 10 MG Tabs tablet   Commonly known as: XARELTO   Take 1 tablet (10 mg total) by mouth daily with breakfast. Take Xarelto for two and a half more weeks, then discontinue Xarelto.      traMADol 50 MG tablet   Commonly known as: ULTRAM   Take 1-2 tablets (50-100 mg total) by mouth every 6 (six) hours as needed.  Follow-up Information    Follow up with Loanne Drilling, MD. Schedule an appointment as soon as possible for a visit in 2 weeks.   Contact information:   [address], [address] [place] [address] [phone]          Signed: Patrica Duel 12/02/2012, 8:17 AM

## 2012-12-02 NOTE — Progress Notes (Signed)
OT Cancellation Note  Patient Details Name: KARRIN EISENMENGER MRN: [id number] DOB: [birth year]   Cancelled Treatment:    Reason Eval/Treat Not Completed: Other (comment) (pt feels comfortable with ADL for d/c. She didn't feel she needed to practice anything further. Has been up to bathroom to 3in1 with assist several times. Verbalizes understanding of tub bench use and has assist.)  Lennox Laity [phone] 12/02/2012, 9:32 AM

## 2012-12-02 NOTE — Progress Notes (Signed)
Physical Therapy Treatment Patient Details Name: Morgan [name] MRN: [id number] DOB: Mar 05, 1951 Today's Date: 12/02/2012 Time: 3244-0102 PT Time Calculation (min): 26 min  PT Assessment / Plan / Recommendation Comments on Treatment Session  POD # 2 pm session L TKR.  Pt amb self in hallway when I arrived on unit.  Performed remaining TKR TE's in sitting of knee flexion, assisted knee flexion and knee extension.  Assisted back to bed to rest and applied ICE.  Instructed on HEP freq and reps.  Instructed on use of ICE.  Instructed on proper tech to get in/oyt car.  Pt has no steps to enter her home.  Home CPM and 3:1 to be delivered to home.    Follow Up Recommendations  Home health PT     Does the patient have the potential to tolerate intense rehabilitation     Barriers to Discharge        Equipment Recommendations  3 in 1 bedside comode;Other (comment) (CPM, 3:1 both will be delivered to house)    Recommendations for Other Services    Frequency 7X/week   Plan Discharge plan remains appropriate    Precautions / Restrictions Precautions Precautions: Knee Precaution Comments: Pt instructed on KI use for amb and no pillow under knee  Required Braces or Orthoses: Knee Immobilizer - Left Knee Immobilizer - Left: Discontinue once straight leg raise with < 10 degree lag Restrictions Weight Bearing Restrictions: No Other Position/Activity Restrictions: WBAT   Pertinent Vitals/Pain C/o 6/10 during TE's Applied ICE/rest    Mobility  Bed Mobility Bed Mobility: Not assessed Supine to Sit: 4: Min guard Details for Bed Mobility Assistance: Pt OOB in recliner Transfers Transfers: Sit to Stand;Stand to Sit Sit to Stand: 5: Supervision;From chair/3-in-1 Stand to Sit: 5: Supervision;To chair/3-in-1 Details for Transfer Assistance: Pt amb self in hallway when I arrived on unit.  "I got bored and wanted to move around". Ambulation/Gait Ambulation/Gait Assistance: 5: Supervision Ambulation  Distance (Feet): 150 Feet Assistive device: Rolling walker Ambulation/Gait Assistance Details: <25% VC's on safety with turns and backward gait sequencing Gait Pattern: Step-to pattern;Decreased stance time - left;Trunk flexed Gait velocity: decreased    Exercises Long Arc Quad: AROM;Left;10 reps;Seated Knee Flexion: AAROM;Left;10 reps;Seated   PT Goals                                                            progressing    Visit Information  Last PT Received On: 12/02/12 Assistance Needed: +1    Subjective Data  Subjective: I don't have any steps to get into my house, my husband is disabled Patient Stated Goal: home   Cognition       Balance     End of Session PT - End of Session Equipment Utilized During Treatment: Gait belt Activity Tolerance: Patient tolerated treatment well Patient left: in bed;with call bell/phone within reach (ICE to L knee)   Felecia Shelling  PTA [facility]  Acute  Rehab Pager     [phone]

## 2013-01-29 ENCOUNTER — Telehealth: Payer: Self-pay | Admitting: Internal Medicine

## 2013-01-29 NOTE — Telephone Encounter (Signed)
Patient Information:  Caller Name: Rayna Sexton  Phone: [phone]  Patient: Morgan [name]  Gender: Female  DOB: 06/19/1961  Age: 62 Years  PCP: Darryll Capers (Adults only)  Pregnant: No  Office Follow Up:  Does the office need to follow up with this patient?: No  Instructions For The Office: N/A  RN Note:  Offered appt for today and pt refusing; states that they can not make an appt for today; would like to have an appt for Monday; encouraged to be seen today; pt voices understanding that we recommend she is seen today but pt is requesting appt for Monday, 02/01/13. Instructedthat we can not scheudle appt for Monday;  We can only schedule same day or next day appt;  Asked to reconsider being seen today; pt and husband statethatthey will call back Monday for an appt; RN instructed to call back if sx worsen  Symptoms  Reason For Call & Symptoms: Pt is calling and states that she is very weak and shaky; no energy; would like to come in to get checked out; sx started 1-2 weeks ago; she still can get up and walk but no energry;  could not finish PT session on 01/28/13;  pt had a knee replacement on 11/30/12; pt is in car with husband now going to husband's doctor appt  Reviewed Health History In EMR: Yes  Reviewed Medications In EMR: Yes  Reviewed Allergies In EMR: Yes  Reviewed Surgeries / Procedures: Yes  Date of Onset of Symptoms: 01/22/2013 OB / GYN:  LMP: Unknown  Guideline(s) Used:  Weakness (Generalized) and Fatigue  Disposition Per Guideline:   Go to Office Now  Reason For Disposition Reached:   Moderate weakness (i.e., interferes with work, school, normal activities) and cause unknown  Advice Given:  N/A  Patient Refused Recommendation:  Patient Refused Appt, Patient Requests Appt At Later Date  PT states that she can not make an appt today

## 2013-01-29 NOTE — Telephone Encounter (Signed)
Please schedule an appt with Padonda on Monday per Dr Lovell Sheehan for weakness

## 2013-01-29 NOTE — Telephone Encounter (Signed)
Pt is sch with NP on 02-01-13 at 845am

## 2013-02-01 ENCOUNTER — Encounter: Payer: Self-pay | Admitting: Family

## 2013-02-01 ENCOUNTER — Ambulatory Visit (INDEPENDENT_AMBULATORY_CARE_PROVIDER_SITE_OTHER): Payer: BC Managed Care – PPO | Admitting: Family

## 2013-02-01 VITALS — BP 130/90 | HR 86 | Wt 154.0 lb

## 2013-02-01 DIAGNOSIS — E785 Hyperlipidemia, unspecified: Secondary | ICD-10-CM

## 2013-02-01 DIAGNOSIS — R739 Hyperglycemia, unspecified: Secondary | ICD-10-CM

## 2013-02-01 DIAGNOSIS — D649 Anemia, unspecified: Secondary | ICD-10-CM

## 2013-02-01 DIAGNOSIS — R7309 Other abnormal glucose: Secondary | ICD-10-CM

## 2013-02-01 DIAGNOSIS — R5383 Other fatigue: Secondary | ICD-10-CM

## 2013-02-01 DIAGNOSIS — R5381 Other malaise: Secondary | ICD-10-CM

## 2013-02-01 LAB — CBC WITH DIFFERENTIAL/PLATELET
Basophils Relative: 0.5 % (ref 0.0–3.0)
Eosinophils Relative: 7.5 % — ABNORMAL HIGH (ref 0.0–5.0)
HCT: 37.6 % (ref 36.0–46.0)
Hemoglobin: 12.3 g/dL (ref 12.0–15.0)
Lymphs Abs: 2.3 10*3/uL (ref 0.7–4.0)
MCV: 91.4 fl (ref 78.0–100.0)
Monocytes Absolute: 0.3 10*3/uL (ref 0.1–1.0)
Neutro Abs: 3.6 10*3/uL (ref 1.4–7.7)
Platelets: 306 10*3/uL (ref 150.0–400.0)
RBC: 4.11 Mil/uL (ref 3.87–5.11)
WBC: 6.7 10*3/uL (ref 4.5–10.5)

## 2013-02-01 LAB — LIPID PANEL
HDL: 55 mg/dL (ref >39.00)
Total CHOL/HDL Ratio: 4
VLDL: 29.8 mg/dL (ref 0.0–40.0)

## 2013-02-01 LAB — BASIC METABOLIC PANEL
BUN: 19 mg/dL (ref 6–23)
CO2: 28 mEq/L (ref 19–32)
Chloride: 105 mEq/L (ref 96–112)
Glucose, Bld: 108 mg/dL — ABNORMAL HIGH (ref 70–99)
Potassium: 4.2 mEq/L (ref 3.5–5.1)

## 2013-02-01 NOTE — Progress Notes (Signed)
Subjective:    Patient ID: Morgan [name], female    DOB: Nov 23, 1951, 62 y.o.   MRN: [id number]  HPI 62 year old white female, nonsmoker, patient of Dr. Lovell Sheehan is in with complaints of persistent fatigue x2 weeks. Patient is 9 weeks status post left knee arthroscopy. Had been on pain medication up until 2 weeks ago. She has less pain. She's continuous undergo physical therapy. Patient reports having surgery previously had difficulty weaning off oxycodone. She also reports an increase in the amount of stress in her life with a sick husband. Patient also reports a history of anemia. Denies any blood in her stools are dark black stools. Denies any weight loss, lightheadedness, dizziness, chest pain, palpitations, shortness of breath or edema. Reports resting well.   Review of Systems  Constitutional: Positive for fatigue. Negative for unexpected weight change.  HENT: Negative.   Respiratory: Negative.   Cardiovascular: Negative.   Gastrointestinal: Negative.   Musculoskeletal: Negative.   Skin: Negative.   Neurological: Negative.   Psychiatric/Behavioral: Negative.    Past Medical History  Diagnosis Date  . Thyroid disease   . Arthritis   . Allergy   . History of colonic polyps   . Mitral regurgitation   . Diastolic dysfunction     neg nuclear stress 08/09/12  EPIC  . Elevated cholesterol   . Depression   . Upper respiratory infection 11/08/12    fever  with sinus congestion-   states improved  . OSA (obstructive sleep apnea)     no cpap-  per patient "mild " per study 4 years ago  . Tinnitus     History   Social History  . Marital Status: Married    Spouse Name: N/A    Number of Children: N/A  . Years of Education: N/A   Occupational History  . Not on file.   Social History Main Topics  . Smoking status: Never Smoker   . Smokeless tobacco: Never Used  . Alcohol Use: Yes  . Drug Use: No  . Sexually Active: Yes   Other Topics Concern  . Not on file   Social  History Narrative  . No narrative on file    Past Surgical History  Procedure Date  . 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 Polypectomy     colon  . Joint replacement 2008    rt knee  . Total knee arthroplasty 11/30/2012    Procedure: TOTAL KNEE ARTHROPLASTY;  Surgeon: Loanne Drilling, MD;  Location: [facility];  Service: Orthopedics;  Laterality: Left;    Family History  Problem Relation Age of Onset  . Heart attack Father   . Hyperlipidemia Father   . Heart disease Father   . Arthritis Other     Allergies  Allergen Reactions  . Sulfonamide Derivatives     REACTION: Rash    Current Outpatient Prescriptions on File Prior to Visit  Medication Sig Dispense Refill  . atorvastatin (LIPITOR) 10 MG tablet Take 10 mg by mouth every evening.      . DULoxetine (CYMBALTA) 30 MG capsule Take 30 mg by mouth every evening.      [name] levothyroxine (SYNTHROID, LEVOTHROID) 88 MCG tablet Take 88 mcg by mouth daily before breakfast.      . mometasone (NASONEX) 50 MCG/ACT nasal spray Place 2 sprays into the nose as needed.      . methocarbamol (ROBAXIN) 500 MG tablet Take 1 tablet (500 mg total) by mouth every 6 (six) hours  as needed.  80 tablet  0  . oxyCODONE (OXY IR/ROXICODONE) 5 MG immediate release tablet Take 0.5-1 tablets (2.5-5 mg total) by mouth every 3 (three) hours as needed.  60 tablet  0  . rivaroxaban (XARELTO) 10 MG TABS tablet Take 1 tablet (10 mg total) by mouth daily with breakfast. Take Xarelto for two and a half more weeks, then discontinue Xarelto.  19 tablet  0  . traMADol (ULTRAM) 50 MG tablet Take 1-2 tablets (50-100 mg total) by mouth every 6 (six) hours as needed.  80 tablet  0    BP 130/90  Pulse 86  Wt 154 lb (69.854 kg)  SpO2 96%chart    Objective:   Physical Exam  Constitutional: She is oriented to person, place, and time. She appears well-developed and well-nourished.  HENT:  Right Ear: External ear normal.  Left Ear: External ear normal.  Nose: Nose  normal.  Mouth/Throat: Oropharynx is clear and moist.  Neck: Normal range of motion. Neck supple.  Cardiovascular: Normal rate, regular rhythm and normal heart sounds.   Pulmonary/Chest: Effort normal and breath sounds normal.  Abdominal: Soft. Bowel sounds are normal.  Musculoskeletal: Normal range of motion.  Neurological: She is alert and oriented to person, place, and time.  Skin: Skin is warm and dry.  Psychiatric: She has a normal mood and affect.          Assessment & Plan:  Assessment:  1. Fatigue 2. Status post left knee arthroscopy 3. History of controlled substance dependence 4. Hyperglycemia  Plan: Lab sent to include B12, CBC, A1c, and BMP, TSH will notify patient pending results. Encouraged healthy diet and exercise. Continue physical therapy. We'll followup in the results of her labs and as needed.

## 2013-02-01 NOTE — Patient Instructions (Addendum)

## 2013-03-04 ENCOUNTER — Other Ambulatory Visit: Payer: Self-pay | Admitting: Internal Medicine

## 2013-03-04 DIAGNOSIS — Z1231 Encounter for screening mammogram for malignant neoplasm of breast: Secondary | ICD-10-CM

## 2013-03-08 ENCOUNTER — Other Ambulatory Visit: Payer: Self-pay | Admitting: *Deleted

## 2013-03-08 MED ORDER — ATORVASTATIN CALCIUM 10 MG PO TABS: 10.0000 mg | ORAL_TABLET | Freq: Every evening | ORAL | Status: DC

## 2013-03-15 ENCOUNTER — Ambulatory Visit (HOSPITAL_COMMUNITY): Payer: BC Managed Care – PPO

## 2013-03-22 ENCOUNTER — Telehealth: Payer: Self-pay | Admitting: Internal Medicine

## 2013-03-22 NOTE — Telephone Encounter (Signed)
Patient states she has been on Cymbalta 30mg  for a while. She states in December that  The office  sent a refill but it was the generic Duloxetine. She has noticed a change with the medicaiton that she does "just not feel the same".  She would like the office to call her in a thirty day supply of the cymbalta not Generic and see if this is the cause.  [facility] [address].  Please contact her [phone]

## 2013-03-23 ENCOUNTER — Other Ambulatory Visit: Payer: Self-pay | Admitting: *Deleted

## 2013-03-23 ENCOUNTER — Ambulatory Visit: Payer: BC Managed Care – PPO | Admitting: Gynecology

## 2013-03-23 NOTE — Telephone Encounter (Signed)
[facility] Triage Call Report Triage Record Num: [id number] Operator: Griselda Miner Patient Name: Morgan [name] Call Date & Time: 03/22/2013 5:17:58PM Patient Phone: [phone] PCP: Darryll Capers Patient Gender: Female PCP Fax : [phone] Patient DOB: 06/18/1952 Practice Name: [facility] Reason for Call: Caller: Annie Paras; PCP: Darryll Capers (Adults only); CB#: [phone]; Call regarding Cymbalta samples; With refill from her mail order pharmacy patient was given generic for Cymbalta. Patient does not thing it has the same effect. She know that it will cost more but she had rather have the brand vs generic. However before the switch is made, she would like to have a prescription sent to the local pharmacy to see if the brand is any different to the generic. RN told patient this request would go to her chart but to follow up in the morning with the office. Calller demonstrated her understanding. Protocol(s) Used: Office Note Recommended Outcome per Protocol: Information Noted and Sent to Office Reason for Outcome: Caller information to office Care Advice: ~ 03/

## 2013-03-23 NOTE — Telephone Encounter (Signed)
Name brand only cymbalta called to pharmacy and they will call pt when ready

## 2013-03-23 NOTE — Telephone Encounter (Signed)
pls advise

## 2013-04-01 ENCOUNTER — Ambulatory Visit (HOSPITAL_COMMUNITY): Payer: BC Managed Care – PPO

## 2013-04-07 ENCOUNTER — Encounter: Payer: Self-pay | Admitting: Internal Medicine

## 2013-04-07 ENCOUNTER — Ambulatory Visit (INDEPENDENT_AMBULATORY_CARE_PROVIDER_SITE_OTHER): Payer: Self-pay | Admitting: Internal Medicine

## 2013-04-07 VITALS — BP 150/80 | HR 68 | Temp 97.9°F | Resp 18 | Wt 152.0 lb

## 2013-04-07 DIAGNOSIS — E663 Overweight: Secondary | ICD-10-CM

## 2013-04-07 DIAGNOSIS — M1990 Unspecified osteoarthritis, unspecified site: Secondary | ICD-10-CM

## 2013-04-07 DIAGNOSIS — R5381 Other malaise: Secondary | ICD-10-CM

## 2013-04-07 DIAGNOSIS — E039 Hypothyroidism, unspecified: Secondary | ICD-10-CM

## 2013-04-07 NOTE — Patient Instructions (Signed)
It is important that you exercise regularly, at least 20 minutes 3 to 4 times per week.  If you develop chest pain or shortness of breath seek  medical attention.  Call or return to clinic prn if these symptoms worsen or fail to improve as anticipated.  

## 2013-04-07 NOTE — Progress Notes (Signed)
Subjective:    Patient ID: Morgan [name], female    DOB: 04-Jul-1951, 62 y.o.   MRN: [id number]  HPI  62 year old patient who is seen today for followup. She complains of persistent fatigue now for several months. She is status post knee replacement surgery on December 2. She's had recent laboratory studies that revealed the resolution of postop anemia. She is now off analgesics. She does have a significant situational stressors especially the poor health of her husband. There are also some financial concerns since she has retired. She does complain of worsening stress. Laboratory studies reviewed. EMR reviewed  Past Medical History  Diagnosis Date  . Thyroid disease   . Arthritis   . Allergy   . History of colonic polyps   . Mitral regurgitation   . Diastolic dysfunction     neg nuclear stress 08/09/12  EPIC  . Elevated cholesterol   . Depression   . Upper respiratory infection 11/08/12    fever  with sinus congestion-   states improved  . OSA (obstructive sleep apnea)     no cpap-  per patient "mild " per study 4 years ago  . Tinnitus     History   Social History  . Marital Status: Married    Spouse Name: N/A    Number of Children: N/A  . Years of Education: N/A   Occupational History  . Not on file.   Social History Main Topics  . Smoking status: Never Smoker   . Smokeless tobacco: Never Used  . Alcohol Use: Yes  . Drug Use: No  . Sexually Active: Yes   Other Topics Concern  . Not on file   Social History Narrative  . No narrative on file    Past Surgical History  Procedure Laterality Date  . 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 Polypectomy      colon  . Joint replacement  2008    rt knee  . Total knee arthroplasty  11/30/2012    Procedure: TOTAL KNEE ARTHROPLASTY;  Surgeon: Loanne Drilling, MD;  Location: [facility];  Service: Orthopedics;  Laterality: Left;    Family History  Problem Relation Age of Onset  . Heart attack Father   .  Hyperlipidemia Father   . Heart disease Father   . Arthritis Other     Allergies  Allergen Reactions  . Sulfonamide Derivatives     REACTION: Rash    Current Outpatient Prescriptions on File Prior to Visit  Medication Sig Dispense Refill  . atorvastatin (LIPITOR) 10 MG tablet Take 1 tablet (10 mg total) by mouth every evening.  90 tablet  1  . DULoxetine (CYMBALTA) 30 MG capsule Take 30 mg by mouth every evening.      [name] levothyroxine (SYNTHROID, LEVOTHROID) 88 MCG tablet Take 88 mcg by mouth daily before breakfast.      . mometasone (NASONEX) 50 MCG/ACT nasal spray Place 2 sprays into the nose as needed.       No current facility-administered medications on file prior to visit.    BP 150/80  Pulse 68  Temp(Src) 97.9 F (36.6 C) (Oral)  Resp 18  Wt 152 lb (68.947 kg)  BMI 26.93 kg/m2  SpO2 99%       Review of Systems  Constitutional: Positive for fatigue.  HENT: Negative for hearing loss, congestion, sore throat, rhinorrhea, dental problem, sinus pressure and tinnitus.   Eyes: Negative for pain, discharge and visual disturbance.  Respiratory:  Negative for cough and shortness of breath.   Cardiovascular: Negative for chest pain, palpitations and leg swelling.  Gastrointestinal: Negative for nausea, vomiting, abdominal pain, diarrhea, constipation, blood in stool and abdominal distention.  Genitourinary: Negative for dysuria, urgency, frequency, hematuria, flank pain, vaginal bleeding, vaginal discharge, difficulty urinating, vaginal pain and pelvic pain.  Musculoskeletal: Negative for joint swelling, arthralgias and gait problem.  Skin: Negative for rash.  Neurological: Positive for weakness. Negative for dizziness, syncope, speech difficulty, numbness and headaches.  Hematological: Negative for adenopathy.  Psychiatric/Behavioral: Negative for behavioral problems, dysphoric mood and agitation. The patient is not nervous/anxious.        Objective:   Physical Exam   Constitutional: She is oriented to person, place, and time. She appears well-developed and well-nourished.  HENT:  Head: Normocephalic.  Right Ear: External ear normal.  Left Ear: External ear normal.  Mouth/Throat: Oropharynx is clear and moist.  Eyes: Conjunctivae and EOM are normal. Pupils are equal, round, and reactive to light.  Neck: Normal range of motion. Neck supple. No thyromegaly present.  Cardiovascular: Normal rate, regular rhythm, normal heart sounds and intact distal pulses.   Pulmonary/Chest: Effort normal and breath sounds normal.  Abdominal: Soft. Bowel sounds are normal. She exhibits no mass. There is no tenderness.  Musculoskeletal: Normal range of motion.  Lymphadenopathy:    She has no cervical adenopathy.  Neurological: She is alert and oriented to person, place, and time.  Skin: Skin is warm and dry. No rash noted.  Psychiatric: She has a normal mood and affect. Her behavior is normal.          Assessment & Plan:   Fatigue probably multifactorial with stress postoperative deconditioning and intermittent insomnia all plan a role. She's had recent lab including TSH and CBC that have been unremarkable. Stress management discussed. She will consider counseling. Has been asked return to see her primary care provider if unimproved

## 2013-04-30 ENCOUNTER — Telehealth: Payer: Self-pay | Admitting: Internal Medicine

## 2013-04-30 NOTE — Telephone Encounter (Signed)
OK with me.

## 2013-04-30 NOTE — Telephone Encounter (Signed)
Ok with me 

## 2013-04-30 NOTE — Telephone Encounter (Signed)
Pt requesting to switch providers from [place] to [place] due to limited availablity with getting in to see PCP.

## 2013-04-30 NOTE — Telephone Encounter (Signed)
Dr Lovell Sheehan believes that will be in their best interest

## 2013-06-01 ENCOUNTER — Other Ambulatory Visit: Payer: Self-pay | Admitting: *Deleted

## 2013-06-01 ENCOUNTER — Telehealth: Payer: Self-pay

## 2013-06-01 MED ORDER — DULOXETINE HCL 30 MG PO CPEP: 30.0000 mg | ORAL_CAPSULE | Freq: Every evening | ORAL | Status: DC

## 2013-06-01 MED ORDER — LEVOTHYROXINE SODIUM 88 MCG PO TABS: 88.0000 ug | ORAL_TABLET | Freq: Every day | ORAL | Status: DC

## 2013-06-01 NOTE — Telephone Encounter (Signed)
Pt left a message stating that she needs refills on her medication (didn't say which meds) to [facility].  Pt hasn't scheduled or seen Dr Abner Greenspan yet.

## 2013-06-01 NOTE — Telephone Encounter (Signed)
We will refill- needs synthroid and generic cymbalta refilled to [facility]-done

## 2013-07-05 ENCOUNTER — Other Ambulatory Visit: Payer: Self-pay

## 2013-07-05 DIAGNOSIS — Z1231 Encounter for screening mammogram for malignant neoplasm of breast: Secondary | ICD-10-CM

## 2013-07-22 ENCOUNTER — Ambulatory Visit
Admission: RE | Admit: 2013-07-22 | Discharge: 2013-07-22 | Disposition: A | Discharge status: Home / Self Care | Payer: BC Managed Care – PPO | Source: Ambulatory Visit

## 2013-07-22 DIAGNOSIS — Z1231 Encounter for screening mammogram for malignant neoplasm of breast: Secondary | ICD-10-CM

## 2013-07-27 ENCOUNTER — Ambulatory Visit (INDEPENDENT_AMBULATORY_CARE_PROVIDER_SITE_OTHER): Payer: BC Managed Care – PPO | Admitting: Gynecology

## 2013-07-27 ENCOUNTER — Encounter: Payer: Self-pay | Admitting: Gynecology

## 2013-07-27 ENCOUNTER — Other Ambulatory Visit (HOSPITAL_COMMUNITY)
Admission: RE | Admit: 2013-07-27 | Discharge: 2013-07-27 | Disposition: A | Discharge status: Home / Self Care | Payer: BC Managed Care – PPO | Source: Ambulatory Visit | Attending: Gynecology | Admitting: Gynecology

## 2013-07-27 VITALS — BP 120/78 | Ht 62.5 in | Wt 150.0 lb

## 2013-07-27 DIAGNOSIS — Z01419 Encounter for gynecological examination (general) (routine) without abnormal findings: Secondary | ICD-10-CM

## 2013-07-27 DIAGNOSIS — Z1151 Encounter for screening for human papillomavirus (HPV): Secondary | ICD-10-CM | POA: Insufficient documentation

## 2013-07-27 DIAGNOSIS — N951 Menopausal and female climacteric states: Secondary | ICD-10-CM

## 2013-07-27 NOTE — Progress Notes (Signed)
Morgan [name] March 07, 1951 [id number]   History:    62 y.o.  for annual gyn exam who has not been seen in the office in the past 3 years.the patient in the past had been on hormone replacement therapy and had discontinued it back in 2011. She is having minimal vasomotor symptoms. She is not sexually active because of postdates illness. The patient has had past history of colon polyps in the past. Last colonoscopy was 2011. Patient denies any vaginal bleeding. Mammogram this year was normal with the exception of the breast being dense. She frequently does her breast exam. She does have history of hypothyroidism and hypercholesterolemia which her primary physicians following. Patient denies any prior history of abnormal Pap smears. Last Pap smear 2009. Patient is in the process of being seen by new part care physician Dr. Joaquin Courts who will be doing her lab work.  Past medical history,surgical history, family history and social history were all reviewed and documented in the EPIC chart.  Gynecologic History No LMP recorded. Patient is postmenopausal. Contraception: post menopausal status Last Pap: 2009. Results were: normal Last mammogram: 2014. Results were: as per above  Obstetric History OB History   Grav Para Term Preterm Abortions TAB SAB Ect Mult Living   1 1        1      # Outc Date GA Lbr Len/2nd Wgt Sex Del Anes PTL Lv   1 PAR                ROS: A ROS was performed and pertinent positives and negatives are included in the history.  GENERAL: No fevers or chills. HEENT: No change in vision, no earache, sore throat or sinus congestion. NECK: No pain or stiffness. CARDIOVASCULAR: No chest pain or pressure. No palpitations. PULMONARY: No shortness of breath, cough or wheeze. GASTROINTESTINAL: No abdominal pain, nausea, vomiting or diarrhea, melena or bright red blood per rectum. GENITOURINARY: No urinary frequency, urgency, hesitancy or dysuria. MUSCULOSKELETAL: No joint or muscle pain,  no back pain, no recent trauma. DERMATOLOGIC: No rash, no itching, no lesions. ENDOCRINE: No polyuria, polydipsia, no heat or cold intolerance. No recent change in weight. HEMATOLOGICAL: No anemia or easy bruising or bleeding. NEUROLOGIC: No headache, seizures, numbness, tingling or weakness. PSYCHIATRIC: No depression, no loss of interest in normal activity or change in sleep pattern.     Exam: chaperone present  BP 120/78  Ht 5' 2.5" (1.588 m)  Wt 150 lb (68.04 kg)  BMI 26.98 kg/m2  Body mass index is 26.98 kg/(m^2).  General appearance : Well developed well nourished female. No acute distress HEENT: Neck supple, trachea midline, no carotid bruits, no thyroidmegaly Lungs: Clear to auscultation, no rhonchi or wheezes, or rib retractions  Heart: Regular rate and rhythm, no murmurs or gallops Breast:Examined in sitting and supine position were symmetrical in appearance, no palpable masses or tenderness,  no skin retraction, no nipple inversion, no nipple discharge, no skin discoloration, no axillary or supraclavicular lymphadenopathy Abdomen: no palpable masses or tenderness, no rebound or guarding Extremities: no edema or skin discoloration or tenderness  Pelvic:  Bartholin, Urethra, Skene Glands: Within normal limits             Vagina: No gross lesions or discharge  Cervix: No gross lesions or discharge  Uterus  anteverted, normal size, shape and consistency, non-tender and mobile  Adnexa  Without masses or tenderness  Anus and perineum  normal   Rectovaginal  normal sphincter tone without palpated  masses or tenderness             Hemoccult card provided     Assessment/Plan:  62 y.o. female for annual exam when no gynecological abnormalities noted. Her Pap smear was done today. The new guidelines were discussed. PCP will be drawn her labs. Hemoccult card provided for her to submit to the office for testing. We discussed importance of calcium and vitamin D for osteoporosis  prevention.she will schedule a bone density study here in our office. Review of patient's records indicate that her vaccinations are up-to-date.    Ok Edwards MD, 12:44 PM 07/27/2013

## 2013-07-27 NOTE — Patient Instructions (Signed)
Bone Densitometry Bone densitometry is a special X-ray that measures your bone density and can be used to help predict your risk of bone fractures. This test is used to determine bone mineral content and density to diagnose osteoporosis. Osteoporosis is the loss of bone that may cause the bone to become weak. Osteoporosis commonly occurs in women entering menopause. However, it may be found in men and in people with other diseases. PREPARATION FOR TEST No preparation necessary. WHO SHOULD BE TESTED?  All women older than 65.  Postmenopausal women (50 to 65) with risk factors for osteoporosis.  People with a previous fracture caused by normal activities.  People with a small body frame (less than 127 poundsor a body mass index [BMI] of less than 21).  People who have a parent with a hip fracture or history of osteoporosis.  People who smoke.  People who have rheumatoid arthritis.  Anyone who engages in excessive alcohol use (more than 3 drinks most days).  Women who experience early menopause. WHEN SHOULD YOU BE RETESTED? Current guidelines suggest that you should wait at least 2 years before doing a bone density test again if your first test was normal.Recent studies indicated that women with normal bone density may be able to wait a few years before needing to repeat a bone density test. You should discuss this with your caregiver.  NORMAL FINDINGS   Normal: less than standard deviation below normal (greater than -1).  Osteopenia: 1 to 2.5 standard deviations below normal (-1 to -2.5).  Osteoporosis: greater than 2.5 standard deviations below normal (less than -2.5). Test results are reported as a "T score" and a "Z score."The T score is a number that compares your bone density with the bone density of healthy, young women.The Z score is a number that compares your bone density with the scores of women who are the same age, gender, and race.  Ranges for normal findings may vary  among different laboratories and hospitals. You should always check with your doctor after having lab work or other tests done to discuss the meaning of your test results and whether your values are considered within normal limits. MEANING OF TEST  Your caregiver will go over the test results with you and discuss the importance and meaning of your results, as well as treatment options and the need for additional tests if necessary. OBTAINING THE TEST RESULTS It is your responsibility to obtain your test results. Ask the lab or department performing the test when and how you will get your results. Document Released: 01/07/2005 Document Revised: 03/09/2012 Document Reviewed: 01/30/2011 [employer] Patient Information 2014 [employer].  

## 2013-08-02 ENCOUNTER — Encounter: Payer: Self-pay | Admitting: Obstetrics and Gynecology

## 2013-09-24 ENCOUNTER — Ambulatory Visit: Payer: BC Managed Care – PPO

## 2013-10-01 ENCOUNTER — Ambulatory Visit: Payer: BC Managed Care – PPO

## 2013-10-01 ENCOUNTER — Ambulatory Visit (INDEPENDENT_AMBULATORY_CARE_PROVIDER_SITE_OTHER): Payer: BC Managed Care – PPO

## 2013-10-01 DIAGNOSIS — Z23 Encounter for immunization: Secondary | ICD-10-CM

## 2013-10-15 ENCOUNTER — Telehealth: Payer: Self-pay | Admitting: *Deleted

## 2013-10-15 NOTE — Telephone Encounter (Signed)
Received call from phlebotomist stating pt came in for bloodwork prior to her upcoming appt with Dr Abner Greenspan on 10/21/13.  Pt saw Provider 2 years ago and will be re-establishing care on 10/23. Notified pt that Provider recommends waiting until her appt to complete blood work as pt needs to re-establish her care with Dr Abner Greenspan first. Notified pt that if she is not able to fast at her upcoming appt that is "ok" with her Provider.   Advised pt she should be able to have labs prior to any future appts once she is re-established with Dr Abner Greenspan.   Pt voices understanding. Specimen discarded.

## 2013-10-22 ENCOUNTER — Ambulatory Visit (INDEPENDENT_AMBULATORY_CARE_PROVIDER_SITE_OTHER): Payer: BC Managed Care – PPO | Admitting: Family Medicine

## 2013-10-22 ENCOUNTER — Encounter: Payer: Self-pay | Admitting: Family Medicine

## 2013-10-22 VITALS — BP 118/78 | HR 70 | Temp 97.9°F | Ht 63.0 in | Wt 155.0 lb

## 2013-10-22 DIAGNOSIS — Z0000 Encounter for general adult medical examination without abnormal findings: Secondary | ICD-10-CM

## 2013-10-22 DIAGNOSIS — L259 Unspecified contact dermatitis, unspecified cause: Secondary | ICD-10-CM

## 2013-10-22 DIAGNOSIS — Z23 Encounter for immunization: Secondary | ICD-10-CM

## 2013-10-22 DIAGNOSIS — F32A Depression, unspecified: Secondary | ICD-10-CM

## 2013-10-22 DIAGNOSIS — E785 Hyperlipidemia, unspecified: Secondary | ICD-10-CM

## 2013-10-22 DIAGNOSIS — N951 Menopausal and female climacteric states: Secondary | ICD-10-CM

## 2013-10-22 DIAGNOSIS — E039 Hypothyroidism, unspecified: Secondary | ICD-10-CM

## 2013-10-22 DIAGNOSIS — M899 Disorder of bone, unspecified: Secondary | ICD-10-CM

## 2013-10-22 DIAGNOSIS — F3289 Other specified depressive episodes: Secondary | ICD-10-CM

## 2013-10-22 DIAGNOSIS — F329 Major depressive disorder, single episode, unspecified: Secondary | ICD-10-CM

## 2013-10-22 DIAGNOSIS — L309 Dermatitis, unspecified: Secondary | ICD-10-CM

## 2013-10-22 MED ORDER — CLOBETASOL PROPIONATE 0.05 % EX LOTN: TOPICAL_LOTION | CUTANEOUS | Status: DC

## 2013-10-22 MED ORDER — DULOXETINE HCL 20 MG PO CPEP: 20.0000 mg | ORAL_CAPSULE | Freq: Every day | ORAL | Status: DC

## 2013-10-22 NOTE — Patient Instructions (Addendum)
Try cleaning with Jeanann Lewandowsky Atringent and take a Zyrtec daily  Cholesterol Cholesterol is a white, waxy, fat-like protein needed by your body in small amounts. The liver makes all the cholesterol you need. It is carried from the liver by the blood through the blood vessels. Deposits (plaque) may build up on blood vessel walls. This makes the arteries narrower and stiffer. Plaque increases the risk for heart attack and stroke. You cannot feel your cholesterol level even if it is very high. The only way to know is by a blood test to check your lipid (fats) levels. Once you know your cholesterol levels, you should keep a record of the test results. Work with your caregiver to to keep your levels in the desired range. WHAT THE RESULTS MEAN:  Total cholesterol is a rough measure of all the cholesterol in your blood.  LDL is the so-called bad cholesterol. This is the type that deposits cholesterol in the walls of the arteries. You want this level to be low.  HDL is the good cholesterol because it cleans the arteries and carries the LDL away. You want this level to be high.  Triglycerides are fat that the body can either burn for energy or store. High levels are closely linked to heart disease. DESIRED LEVELS:  Total cholesterol below 200.  LDL below 100 for people at risk, below 70 for very high risk.  HDL above 50 is good, above 60 is best.  Triglycerides below 150. HOW TO LOWER YOUR CHOLESTEROL:  Diet.  Choose fish or white meat chicken and [place], roasted or baked. Limit fatty cuts of red meat, fried foods, and processed meats, such as sausage and lunch meat.  Eat lots of fresh fruits and vegetables. Choose whole grains, beans, pasta, potatoes and cereals.  Use only small amounts of olive, corn or canola oils. Avoid butter, mayonnaise, shortening or palm kernel oils. Avoid foods with trans-fats.  Use skim/nonfat milk and low-fat/nonfat yogurt and cheeses. Avoid whole milk, cream, ice  cream, egg yolks and cheeses. Healthy desserts include angel food cake, ginger snaps, animal crackers, hard candy, popsicles, and low-fat/nonfat frozen yogurt. Avoid pastries, cakes, pies and cookies.  Exercise.  A regular program helps decrease LDL and raises HDL.  Helps with weight control.  Do things that increase your activity level like gardening, walking, or taking the stairs.  Medication.  May be prescribed by your caregiver to help lowering cholesterol and the risk for heart disease.  You may need medicine even if your levels are normal if you have several risk factors. HOME CARE INSTRUCTIONS   Follow your diet and exercise programs as suggested by your caregiver.  Take medications as directed.  Have blood work done when your caregiver feels it is necessary. MAKE SURE YOU:   Understand these instructions.  Will watch your condition.  Will get help right away if you are not doing well or get worse. Document Released: 09/10/2001 Document Revised: 03/09/2012 Document Reviewed: 03/02/2008 [facility] Patient Information 2014 [place], [place].

## 2013-10-24 ENCOUNTER — Encounter: Payer: Self-pay | Admitting: Family Medicine

## 2013-10-24 DIAGNOSIS — Z0000 Encounter for general adult medical examination without abnormal findings: Secondary | ICD-10-CM | POA: Insufficient documentation

## 2013-10-24 NOTE — Assessment & Plan Note (Signed)
Exercises, needs upper and lower body exercise. Needs 3 servings of calcium with vit d daily

## 2013-10-24 NOTE — Assessment & Plan Note (Signed)
Given pneumonia shot today, encouraged heart healthy diet and regular exercise and sleep. Return after fasting labs.

## 2013-10-24 NOTE — Assessment & Plan Note (Signed)
She stopper her statin she will stay off for next 2 to 3 months and we will recheck with improved diet and exercise before patient decides if she is willing to try statins again. Avoid trans fats, continue krill oil caps. Continue aspirin

## 2013-10-24 NOTE — Progress Notes (Signed)
Patient ID: Morgan [name], female   DOB: 1951/05/18, 62 y.o.   MRN: [id number] Morgan [name] [id number] [birth year] 10/24/2013      Progress Note-Follow Up  Subjective  Chief Complaint  Chief Complaint  Patient presents with  . Injections    prevnar  . Establish Care    new patient- pt transferring care from Dr Lovell Sheehan    HPI  Patient is a 62 year old Caucasian female who is in today for annual exam and to establish care. She has previously been seen elsewhere in the lumbar system. She denies any recent illness. She's had no chest pain, palpitations, fevers, congestion, headaches, GI or GU concerns. Is taking medications as prescribed except for her statin medication. She did not have any difficulty with it but she has tried to change her diet and exercise and is hoping that is helpful. Sees Dr Lily Peer for paps  Past Medical History  Diagnosis Date  . Thyroid disease   . Arthritis   . Allergy   . History of colonic polyps   . Mitral regurgitation   . Diastolic dysfunction     neg nuclear stress 08/09/12  EPIC  . Elevated cholesterol   . Depression   . Upper respiratory infection 11/08/12    fever  with sinus congestion-   states improved  . OSA (obstructive sleep apnea)     no cpap-  per patient "mild " per study 4 years ago  . Tinnitus   . Preventative health care 10/24/2013    Past Surgical History  Procedure Laterality Date  . 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 Polypectomy      colon  . Joint replacement  2008    rt knee  . Total knee arthroplasty  11/30/2012    Procedure: TOTAL KNEE ARTHROPLASTY;  Surgeon: Loanne Drilling, MD;  Location: [facility];  Service: Orthopedics;  Laterality: Left;    Family History  Problem Relation Age of Onset  . Heart attack Father   . Hyperlipidemia Father   . Heart disease Father   . Cancer Father     BRAIN TUMOR, malignant  . Arthritis Other   . Breast cancer Maternal Aunt   . Breast cancer Paternal Aunt   .  Macular degeneration Mother   . Diabetes Brother     diet controlled  . Migraines Daughter   . Pulmonary embolism Daughter     History   Social History  . Marital Status: Married    Spouse Name: N/A    Number of Children: N/A  . Years of Education: N/A   Occupational History  . Not on file.   Social History Main Topics  . Smoking status: Never Smoker   . Smokeless tobacco: Never Used  . Alcohol Use: Yes  . Drug Use: No  . Sexual Activity: Yes     Comment: lives with husband   Other Topics Concern  . Not on file   Social History Narrative  . No narrative on file    Current Outpatient Prescriptions on File Prior to Visit  Medication Sig Dispense Refill  . DULoxetine (CYMBALTA) 30 MG capsule Take 1 capsule (30 mg total) by mouth every evening.  90 capsule  3  . levothyroxine (SYNTHROID, LEVOTHROID) 88 MCG tablet Take 1 tablet (88 mcg total) by mouth daily before breakfast.  90 tablet  3  . mometasone (NASONEX) 50 MCG/ACT nasal spray Place 2 sprays into the nose as needed.  No current facility-administered medications on file prior to visit.    Allergies  Allergen Reactions  . Sulfonamide Derivatives     REACTION: Rash    Review of Systems  Review of Systems  Constitutional: Negative for fever, chills and malaise/fatigue.  HENT: Negative for congestion, hearing loss and nosebleeds.   Eyes: Negative for discharge.  Respiratory: Negative for cough, sputum production, shortness of breath and wheezing.   Cardiovascular: Negative for chest pain, palpitations and leg swelling.  Gastrointestinal: Negative for heartburn, nausea, vomiting, abdominal pain, diarrhea, constipation and blood in stool.  Genitourinary: Negative for dysuria, urgency, frequency and hematuria.  Musculoskeletal: Negative for back pain, falls and myalgias.  Skin: Negative for rash.  Neurological: Negative for dizziness, tremors, sensory change, focal weakness, loss of consciousness, weakness  and headaches.  Endo/Heme/Allergies: Negative for polydipsia. Does not bruise/bleed easily.  Psychiatric/Behavioral: Negative for depression and suicidal ideas. The patient is not nervous/anxious and does not have insomnia.     Objective  BP 118/78  Pulse 70  Temp(Src) 97.9 F (36.6 C) (Oral)  Ht 5\' 3"  (1.6 m)  Wt 155 lb 0.6 oz (70.326 kg)  BMI 27.47 kg/m2  SpO2 97%  Physical Exam  Physical Exam  Constitutional: She is oriented to person, place, and time and well-developed, well-nourished, and in no distress. No distress.  HENT:  Head: Normocephalic and atraumatic.  Right Ear: External ear normal.  Left Ear: External ear normal.  Nose: Nose normal.  Mouth/Throat: Oropharynx is clear and moist. No oropharyngeal exudate.  Eyes: Conjunctivae are normal. Pupils are equal, round, and reactive to light. Right eye exhibits no discharge. Left eye exhibits no discharge. No scleral icterus.  Neck: Normal range of motion. Neck supple. No thyromegaly present.  Cardiovascular: Normal rate, regular rhythm, normal heart sounds and intact distal pulses.   No murmur heard. Pulmonary/Chest: Effort normal and breath sounds normal. No respiratory distress. She has no wheezes. She has no rales.  Abdominal: Soft. Bowel sounds are normal. She exhibits no distension and no mass. There is no tenderness.  Musculoskeletal: Normal range of motion. She exhibits no edema and no tenderness.  Lymphadenopathy:    She has no cervical adenopathy.  Neurological: She is alert and oriented to person, place, and time. She has normal reflexes. No cranial nerve deficit. Coordination normal.  Skin: Skin is warm and dry. No rash noted. She is not diaphoretic.  Psychiatric: Mood, memory and affect normal.    Lab Results  Component Value Date   TSH 1.13 02/01/2013   Lab Results  Component Value Date   WBC 6.7 02/01/2013   HGB 12.3 02/01/2013   HCT 37.6 02/01/2013   MCV 91.4 02/01/2013   PLT 306.0 02/01/2013   Lab Results   Component Value Date   CREATININE 0.6 02/01/2013   BUN 19 02/01/2013   NA 140 02/01/2013   K 4.2 02/01/2013   CL 105 02/01/2013   CO2 28 02/01/2013   Lab Results  Component Value Date   ALT 16 11/20/2012   AST 20 11/20/2012   ALKPHOS 92 11/20/2012   BILITOT 0.3 11/20/2012   Lab Results  Component Value Date   CHOL 242* 02/01/2013   Lab Results  Component Value Date   HDL 55.00 02/01/2013   Lab Results  Component Value Date   LDLCALC 91 08/11/2012   Lab Results  Component Value Date   TRIG 149.0 02/01/2013   Lab Results  Component Value Date   CHOLHDL 4 02/01/2013  Assessment & Plan  HYPOTHYROIDISM Well treated on current dose of Levothyroxine  OSTEOPENIA Exercises, needs upper and lower body exercise. Needs 3 servings of calcium with vit d daily  HYPERLIPIDEMIA NEC/NOS She stopper her statin she will stay off for next 2 to 3 months and we will recheck with improved diet and exercise before patient decides if she is willing to try statins again. Avoid trans fats, continue krill oil caps. 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 Return after fasting labs.

## 2013-10-24 NOTE — Assessment & Plan Note (Signed)
Well treated on current dose of Levothyroxine 

## 2013-10-24 NOTE — Assessment & Plan Note (Signed)
Has been doing well with Cymbalta at 30 mg will try dropping to 20 mg daily, call if needs ot go back up to 30 mg

## 2014-01-19 ENCOUNTER — Telehealth: Payer: Self-pay | Admitting: Family Medicine

## 2014-01-19 DIAGNOSIS — E785 Hyperlipidemia, unspecified: Secondary | ICD-10-CM

## 2014-01-19 DIAGNOSIS — Z0000 Encounter for general adult medical examination without abnormal findings: Secondary | ICD-10-CM

## 2014-01-19 DIAGNOSIS — E039 Hypothyroidism, unspecified: Secondary | ICD-10-CM

## 2014-01-19 NOTE — Telephone Encounter (Signed)
Lab order placed.

## 2014-01-19 NOTE — Telephone Encounter (Signed)
comments: Labs prior to the next, lipid, renal, tsh, hepatic, hgba1c and cbc   Patient will be going to [facility]

## 2014-02-14 LAB — CBC
HCT: 37.9 % (ref 36.0–46.0)
Hemoglobin: 12.5 g/dL (ref 12.0–15.0)
MCH: 29.6 pg (ref 26.0–34.0)
MCHC: 33 g/dL (ref 30.0–36.0)
MCV: 89.8 fL (ref 78.0–100.0)
Platelets: 306 10*3/uL (ref 150–400)
RBC: 4.22 MIL/uL (ref 3.87–5.11)
RDW: 14.2 % (ref 11.5–15.5)
WBC: 6.4 10*3/uL (ref 4.0–10.5)

## 2014-02-14 LAB — HEPATIC FUNCTION PANEL
ALBUMIN: 4.5 g/dL (ref 3.5–5.2)
ALT: 14 U/L (ref 0–35)
AST: 17 U/L (ref 0–37)
Alkaline Phosphatase: 80 U/L (ref 39–117)
BILIRUBIN DIRECT: 0.1 mg/dL (ref 0.0–0.3)
BILIRUBIN TOTAL: 0.5 mg/dL (ref 0.2–1.2)
Indirect Bilirubin: 0.4 mg/dL (ref 0.2–1.2)
Total Protein: 7 g/dL (ref 6.0–8.3)

## 2014-02-14 LAB — RENAL FUNCTION PANEL
ALBUMIN: 4.5 g/dL (ref 3.5–5.2)
BUN: 18 mg/dL (ref 6–23)
CALCIUM: 9.1 mg/dL (ref 8.4–10.5)
CO2: 26 mEq/L (ref 19–32)
Chloride: 106 mEq/L (ref 96–112)
Creat: 0.58 mg/dL (ref 0.50–1.10)
Glucose, Bld: 94 mg/dL (ref 70–99)
PHOSPHORUS: 3.6 mg/dL (ref 2.3–4.6)
POTASSIUM: 4 meq/L (ref 3.5–5.3)
SODIUM: 141 meq/L (ref 135–145)

## 2014-02-14 LAB — LIPID PANEL
CHOL/HDL RATIO: 4 ratio
Cholesterol: 238 mg/dL — ABNORMAL HIGH (ref 0–200)
HDL: 59 mg/dL (ref >39)
LDL CALC: 159 mg/dL — AB (ref 0–99)
Triglycerides: 99 mg/dL (ref <150)
VLDL: 20 mg/dL (ref 0–40)

## 2014-02-14 LAB — HEMOGLOBIN A1C
HEMOGLOBIN A1C: 6.1 % — AB (ref <5.7)
MEAN PLASMA GLUCOSE: 128 mg/dL — AB (ref <117)

## 2014-02-15 LAB — TSH: TSH: 0.239 u[IU]/mL — AB (ref 0.350–4.500)

## 2014-02-22 ENCOUNTER — Encounter: Payer: Self-pay | Admitting: Family Medicine

## 2014-02-22 ENCOUNTER — Ambulatory Visit (INDEPENDENT_AMBULATORY_CARE_PROVIDER_SITE_OTHER): Payer: BC Managed Care – PPO | Admitting: Family Medicine

## 2014-02-22 VITALS — BP 102/78 | HR 78 | Temp 97.7°F | Ht 63.0 in | Wt 154.0 lb

## 2014-02-22 DIAGNOSIS — E663 Overweight: Secondary | ICD-10-CM

## 2014-02-22 DIAGNOSIS — E785 Hyperlipidemia, unspecified: Secondary | ICD-10-CM

## 2014-02-22 DIAGNOSIS — F329 Major depressive disorder, single episode, unspecified: Secondary | ICD-10-CM

## 2014-02-22 DIAGNOSIS — B001 Herpesviral vesicular dermatitis: Secondary | ICD-10-CM

## 2014-02-22 DIAGNOSIS — N951 Menopausal and female climacteric states: Secondary | ICD-10-CM

## 2014-02-22 DIAGNOSIS — R739 Hyperglycemia, unspecified: Secondary | ICD-10-CM | POA: Insufficient documentation

## 2014-02-22 DIAGNOSIS — R5381 Other malaise: Secondary | ICD-10-CM

## 2014-02-22 DIAGNOSIS — F32A Depression, unspecified: Secondary | ICD-10-CM

## 2014-02-22 DIAGNOSIS — E039 Hypothyroidism, unspecified: Secondary | ICD-10-CM

## 2014-02-22 DIAGNOSIS — F411 Generalized anxiety disorder: Secondary | ICD-10-CM | POA: Insufficient documentation

## 2014-02-22 DIAGNOSIS — F3289 Other specified depressive episodes: Secondary | ICD-10-CM

## 2014-02-22 DIAGNOSIS — R7309 Other abnormal glucose: Secondary | ICD-10-CM

## 2014-02-22 DIAGNOSIS — F419 Anxiety disorder, unspecified: Secondary | ICD-10-CM

## 2014-02-22 DIAGNOSIS — M899 Disorder of bone, unspecified: Secondary | ICD-10-CM

## 2014-02-22 DIAGNOSIS — M1990 Unspecified osteoarthritis, unspecified site: Secondary | ICD-10-CM

## 2014-02-22 DIAGNOSIS — R5383 Other fatigue: Secondary | ICD-10-CM

## 2014-02-22 DIAGNOSIS — F341 Dysthymic disorder: Secondary | ICD-10-CM

## 2014-02-22 DIAGNOSIS — B009 Herpesviral infection, unspecified: Secondary | ICD-10-CM

## 2014-02-22 DIAGNOSIS — M949 Disorder of cartilage, unspecified: Secondary | ICD-10-CM

## 2014-02-22 HISTORY — DX: Anxiety disorder, unspecified: F41.9

## 2014-02-22 HISTORY — DX: Depression, unspecified: F32.A

## 2014-02-22 MED ORDER — DULOXETINE HCL 30 MG PO CPEP: 30.0000 mg | ORAL_CAPSULE | Freq: Every evening | ORAL | Status: DC

## 2014-02-22 MED ORDER — LEVOTHYROXINE SODIUM 88 MCG PO TABS: 88.0000 ug | ORAL_TABLET | Freq: Every day | ORAL | Status: DC

## 2014-02-22 MED ORDER — SIMVASTATIN 10 MG PO TABS: 10.0000 mg | ORAL_TABLET | Freq: Every day | ORAL | Status: DC

## 2014-02-22 MED ORDER — ACYCLOVIR 400 MG PO TABS: 400.0000 mg | ORAL_TABLET | Freq: Three times a day (TID) | ORAL | Status: DC

## 2014-02-22 NOTE — Progress Notes (Signed)
Patient ID: Morgan [name], female   DOB: 03/08/1951, 63 y.o.   MRN: [id number] Morgan [name] [id number] 09-25-1951 02/22/2014      Progress Note-Follow Up  Subjective  Chief Complaint  Chief Complaint  Patient presents with  . Follow-up    3 month- discuss lab results    HPI  63 year old Caucasian female who is in today for routine followup. She generally feeling well. She does note that atorvastatin caused fatigue and myalgias. She tolerated Crestor but her insurance will no longer pain for. She's having increased cold sores in her mouth recently and has increasing excessive fatigue. She has noted some episodes of feeling weak and shaky when she skips meals. She is taking all his medications as prescribed. No recent illness, chest pain, palpitations or shortness of breath. She has been under a great deal of stress with her husband chronic pain and illness.  Past Medical History  Diagnosis Date  . Thyroid disease   . Arthritis   . Allergy   . History of colonic polyps   . Mitral regurgitation   . Diastolic dysfunction     neg nuclear stress 08/09/12  EPIC  . Elevated cholesterol   . Depression   . Upper respiratory infection 11/08/12    fever  with sinus congestion-   states improved  . OSA (obstructive sleep apnea)     no cpap-  per patient "mild " per study 4 years ago  . Tinnitus   . Preventative health care 10/24/2013    Past Surgical History  Procedure Laterality Date  . 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 Polypectomy      colon  . Joint replacement  2008    rt knee  . Total knee arthroplasty  11/30/2012    Procedure: TOTAL KNEE ARTHROPLASTY;  Surgeon: Gearlean Alf, MD;  Location: [facility];  Service: Orthopedics;  Laterality: Left;    Family History  Problem Relation Age of Onset  . Heart attack Father   . Hyperlipidemia Father   . Heart disease Father   . Cancer Father     BRAIN TUMOR, malignant  . Arthritis Other   . Breast cancer Maternal Aunt    . Breast cancer Paternal Aunt   . Macular degeneration Mother   . Diabetes Brother     diet controlled  . Migraines Daughter   . Pulmonary embolism Daughter     History   Social History  . Marital Status: Married    Spouse Name: N/A    Number of Children: N/A  . Years of Education: N/A   Occupational History  . Not on file.   Social History Main Topics  . Smoking status: Never Smoker   . Smokeless tobacco: Never Used  . Alcohol Use: Yes  . Drug Use: No  . Sexual Activity: Yes     Comment: lives with husband   Other Topics Concern  . Not on file   Social History Narrative  . No narrative on file    Current Outpatient Prescriptions on File Prior to Visit  Medication Sig Dispense Refill  . Clobetasol Propionate 0.05 % lotion Apply small amount to affected area  59 mL  1  . DULoxetine (CYMBALTA) 30 MG capsule Take 1 capsule (30 mg total) by mouth every evening.  90 capsule  3  . levothyroxine (SYNTHROID, LEVOTHROID) 88 MCG tablet Take 1 tablet (88 mcg total) by mouth daily before breakfast.  90 tablet  3  .  mometasone (NASONEX) 50 MCG/ACT nasal spray Place 2 sprays into the nose as needed.       No current facility-administered medications on file prior to visit.    Allergies  Allergen Reactions  . Sulfonamide Derivatives     REACTION: Rash    Review of Systems  Review of Systems  Constitutional: Negative for fever and malaise/fatigue.  HENT: Negative for congestion.   Eyes: Negative for discharge.  Respiratory: Negative for shortness of breath.   Cardiovascular: Negative for chest pain, palpitations and leg swelling.  Gastrointestinal: Negative for nausea, abdominal pain and diarrhea.  Genitourinary: Negative for dysuria.  Musculoskeletal: Negative for falls.  Skin: Negative for rash.  Neurological: Negative for loss of consciousness and headaches.  Endo/Heme/Allergies: Negative for polydipsia.  Psychiatric/Behavioral: Negative for depression and  suicidal ideas. The patient is not nervous/anxious and does not have insomnia.     Objective  BP 102/78  Pulse 78  Temp(Src) 97.7 F (36.5 C) (Oral)  Ht 5\' 3"  (1.6 m)  Wt 154 lb 0.6 oz (69.872 kg)  BMI 27.29 kg/m2  SpO2 96%  Physical Exam  Physical Exam  Constitutional: She is oriented to person, place, and time and well-developed, well-nourished, and in no distress. No distress.  HENT:  Head: Normocephalic and atraumatic.  Eyes: Conjunctivae are normal.  Neck: Neck supple. No thyromegaly present.  Cardiovascular: Normal rate, regular rhythm and normal heart sounds.   No murmur heard. Pulmonary/Chest: Effort normal and breath sounds normal. She has no wheezes.  Abdominal: She exhibits no distension and no mass.  Musculoskeletal: She exhibits no edema.  Lymphadenopathy:    She has no cervical adenopathy.  Neurological: She is alert and oriented to person, place, and time.  Skin: Skin is warm and dry. No rash noted. She is not diaphoretic.  Psychiatric: Memory, affect and judgment normal.    Lab Results  Component Value Date   TSH 0.239* 02/14/2014   Lab Results  Component Value Date   WBC 6.4 02/14/2014   HGB 12.5 02/14/2014   HCT 37.9 02/14/2014   MCV 89.8 02/14/2014   PLT 306 02/14/2014   Lab Results  Component Value Date   CREATININE 0.58 02/14/2014   BUN 18 02/14/2014   NA 141 02/14/2014   K 4.0 02/14/2014   CL 106 02/14/2014   CO2 26 02/14/2014   Lab Results  Component Value Date   ALT 14 02/14/2014   AST 17 02/14/2014   ALKPHOS 80 02/14/2014   BILITOT 0.5 02/14/2014   Lab Results  Component Value Date   CHOL 238* 02/14/2014   Lab Results  Component Value Date   HDL 59 02/14/2014   Lab Results  Component Value Date   LDLCALC 159* 02/14/2014   Lab Results  Component Value Date   TRIG 99 02/14/2014   Lab Results  Component Value Date   CHOLHDL 4.0 02/14/2014     Assessment & Plan   Overweight Encouraged DASH diet, increase exercise stress  reduction, adequate sleep and avoid trans fats and GMOs.   HYPOTHYROIDISM Well treated on current dose of medications. No changes  HYPERLIPIDEMIA NEC/NOS Avoid trans fats, increase exercise. Monitor, continue Simvastatin  OSTEOPENIA Check Vitamin D level with next draw. 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   Hyperglycemia Mildly elevated  hgba1c, discussed diet and exercise at length. Need to avoid simple carbs and increase exercise

## 2014-02-22 NOTE — Assessment & Plan Note (Signed)
Check Vitamin D level with next draw. Encouraged regular exercise and calcium vitamin d intake

## 2014-02-22 NOTE — Assessment & Plan Note (Signed)
Encouraged DASH diet, increase exercise stress reduction, adequate sleep and avoid trans fats and GMOs.

## 2014-02-22 NOTE — Patient Instructions (Addendum)
Eat every 4 hours with lean proteins, complex carbs and healthy fats  Probiotic daily and zinc roughly 50 mg daily  Basic Carbohydrate Counting Basic carbohydrate counting is a way to plan meals. It is done by counting the amount of carbohydrate in foods. Foods that have carbohydrates are starches (grains, beans, starchy vegetables) and sweets. Eating carbohydrates increases blood glucose (sugar) levels. People with diabetes use carbohydrate counting to help keep their blood glucose at a normal level.  COUNTING CARBOHYDRATES IN FOODS The first step in counting carbohydrates is to learn how many carbohydrate servings you should have in every meal. A dietitian can plan this for you. After learning the amount of carbohydrates to include in your meal plan, you can start to choose the carbohydrate-containing foods you want to eat.  There are 2 ways to identify the amount of carbohydrates in the foods you eat.  Read the Nutrition Facts panel on food labels. You need 2 pieces of information from the Nutrition Facts panel to count carbohydrates this way:  Serving size.  Total carbohydrate (in grams). Decide how many servings you will be eating. If it is 1 serving, you will be eating the amount of carbohydrate listed on the panel. If you will be eating 2 servings, you will be eating double the amount of carbohydrate listed on the panel.   Learn serving sizes. A serving size of most carbohydrate-containing foods is about 15 grams (g). Listed below are single serving sizes of common carbohydrate-containing foods:  1 slice bread.   cup unsweetened, dry cereal.   cup hot cereal.   cup rice.   cup mashed potatoes.   cup pasta.  1 cup fresh fruit.   cup canned fruit.  1 cup milk (whole, 2%, or skim).   cup starchy vegetables (peas, corn, or potatoes). Counting carbohydrates this way is similar to looking on the Nutrition Facts panel. Decide how many servings you will eat first. Multiply  the number of servings you eat by 15 g. For example, if you have 2 cups of strawberries, you had 2 servings. That means you had 30 g of carbohydrate (2 servings x 15 g = 30 g). CALCULATING CARBOHYDRATES IN A MEAL Sample dinner  3 oz chicken breast.   cup brown rice.   cup corn.  1 cup fat-free milk.  1 cup strawberries with sugar-free whipped topping. Carbohydrate calculation First, identify the foods that contain carbohydrate:  Rice.  Corn.  Milk.  Strawberries. Calculate the number of servings eaten:  2 servings rice.  1 serving corn.  1 serving milk.  1 serving strawberries. Multiply the number of servings by 15 g:  2 servings rice x 15 g = 30 g.  1 serving corn x 15 g = 15 g.  1 serving milk x 15 g = 15 g.  1 serving strawberries x 15 g = 15 g. Add the amounts to find the total carbohydrates eaten: 30 g + 15 g + 15 g + 15 g = 75 g carbohydrate eaten at dinner. Document Released: 12/16/2005 Document Revised: 03/09/2012 Document Reviewed: 11/01/2011 [facility] Patient Information 2014 [place], [place]. DASH Diet The DASH diet stands for "Dietary Approaches to Stop Hypertension." It is a healthy eating plan that has been shown to reduce high blood pressure (hypertension) in as little as 14 days, while also possibly providing other significant health benefits. These other health benefits include reducing the risk of breast cancer after menopause and reducing the risk of type 2 diabetes, heart disease,  colon cancer, and stroke. Health benefits also include weight loss and slowing kidney failure in patients with chronic kidney disease.  DIET GUIDELINES  Limit salt (sodium). Your diet should contain less than 1500 mg of sodium daily.  Limit refined or processed carbohydrates. Your diet should include mostly whole grains. Desserts and added sugars should be used sparingly.  Include small amounts of heart-healthy fats. These types of fats include nuts, oils, and tub  margarine. Limit saturated and trans fats. These fats have been shown to be harmful in the body. CHOOSING FOODS  The following food groups are based on a 2000 calorie diet. See your Registered Dietitian for individual calorie needs. Grains and Grain Products (6 to 8 servings daily)  Eat More Often: Whole-wheat bread, brown rice, whole-grain or wheat pasta, quinoa, popcorn without added fat or salt (air popped).  Eat Less Often: White bread, white pasta, white rice, cornbread. Vegetables (4 to 5 servings daily)  Eat More Often: Fresh, frozen, and canned vegetables. Vegetables may be raw, steamed, roasted, or grilled with a minimal amount of fat.  Eat Less Often/Avoid: Creamed or fried vegetables. Vegetables in a cheese sauce. Fruit (4 to 5 servings daily)  Eat More Often: All fresh, canned (in natural juice), or frozen fruits. Dried fruits without added sugar. One hundred percent fruit juice ( cup [237 mL] daily).  Eat Less Often: Dried fruits with added sugar. Canned fruit in light or heavy syrup. [employer], Fish, and Poultry (2 servings or less daily. One serving is 3 to 4 oz [85-114 g]).  Eat More Often: Ninety percent or leaner ground beef, tenderloin, sirloin. Round cuts of beef, chicken breast, [place] breast. All fish. Grill, bake, or broil your meat. Nothing should be fried.  Eat Less Often/Avoid: Fatty cuts of meat, [place], or chicken leg, thigh, or wing. Fried cuts of meat or fish. Dairy (2 to 3 servings)  Eat More Often: Low-fat or fat-free milk, low-fat plain or light yogurt, reduced-fat or part-skim cheese.  Eat Less Often/Avoid: Milk (whole, 2%).Whole milk yogurt. Full-fat cheeses. Nuts, Seeds, and Legumes (4 to 5 servings per week)  Eat More Often: All without added salt.  Eat Less Often/Avoid: Salted nuts and seeds, canned beans with added salt. Fats and Sweets (limited)  Eat More Often: Vegetable oils, tub margarines without trans fats, sugar-free gelatin.  Mayonnaise and salad dressings.  Eat Less Often/Avoid: Coconut oils, palm oils, butter, stick margarine, cream, half and half, cookies, candy, pie. FOR MORE INFORMATION The Dash Diet Eating Plan: www.dashdiet.org Document Released: 12/05/2011 Document Revised: 03/09/2012 Document Reviewed: 12/05/2011 [facility] Patient Information 2014 [place], [place].

## 2014-02-22 NOTE — Assessment & Plan Note (Addendum)
Avoid trans fats, increase exercise. Monitor, continue Simvastatin

## 2014-02-22 NOTE — Assessment & Plan Note (Signed)
Responding to Cymbalta

## 2014-02-22 NOTE — Assessment & Plan Note (Signed)
Well treated on current dose of medications. No changes

## 2014-02-22 NOTE — Assessment & Plan Note (Signed)
Mildly elevated hgba1c, discussed diet and exercise at length. Need to avoid simple carbs and increase exercise

## 2014-02-22 NOTE — Progress Notes (Signed)
Pre visit review using our clinic review tool, if applicable. No additional management support is needed unless otherwise documented below in the visit note. 

## 2014-02-22 NOTE — Assessment & Plan Note (Signed)
Feels she does better on Cymbalta 30 than Cymbalta 20 will increase again

## 2014-02-22 NOTE — Assessment & Plan Note (Signed)
Multfactorial related to stress, sugar, etc.

## 2014-03-31 ENCOUNTER — Telehealth: Payer: Self-pay

## 2014-03-31 MED ORDER — ATORVASTATIN CALCIUM 10 MG PO TABS: 10.0000 mg | ORAL_TABLET | Freq: Every day | ORAL | Status: DC

## 2014-03-31 NOTE — Telephone Encounter (Signed)
OK to switch her back to her Atorvastatin at same dose and 90 day is fine with 1 rf

## 2014-03-31 NOTE — Telephone Encounter (Signed)
Patient left a message stating that she was taking Atorvastatin and then switched to Simvastatin when she came in for her appt? Pt stated she would like to go back to Atorvastatin (didn't say why) and would like it called into [facility] 90 day supply

## 2014-03-31 NOTE — Telephone Encounter (Signed)
Rx sent to [facility].

## 2014-06-02 ENCOUNTER — Other Ambulatory Visit: Payer: Self-pay

## 2014-06-02 DIAGNOSIS — F32A Depression, unspecified: Secondary | ICD-10-CM

## 2014-06-02 DIAGNOSIS — E039 Hypothyroidism, unspecified: Secondary | ICD-10-CM

## 2014-06-02 DIAGNOSIS — F329 Major depressive disorder, single episode, unspecified: Secondary | ICD-10-CM

## 2014-06-02 MED ORDER — LEVOTHYROXINE SODIUM 88 MCG PO TABS: 88.0000 ug | ORAL_TABLET | Freq: Every day | ORAL | Status: DC

## 2014-06-02 MED ORDER — DULOXETINE HCL 30 MG PO CPEP: 30.0000 mg | ORAL_CAPSULE | Freq: Every evening | ORAL | Status: DC

## 2014-06-23 ENCOUNTER — Encounter: Payer: Self-pay | Admitting: Gastroenterology

## 2014-07-07 ENCOUNTER — Telehealth: Payer: Self-pay

## 2014-07-07 DIAGNOSIS — E039 Hypothyroidism, unspecified: Secondary | ICD-10-CM

## 2014-07-07 MED ORDER — SYNTHROID 88 MCG PO TABS: 88.0000 ug | ORAL_TABLET | Freq: Every day | ORAL | Status: DC

## 2014-07-07 NOTE — Telephone Encounter (Signed)
Patient states that she needs a new RX of Synthroid (name brand) sent to mail order. And a 2 week supply to [facility].  Pt states she received Levothyroxine and just doesn't feel "right". Pt states she usually has Synthroid.  AC[facility]Z sent

## 2014-09-03 ENCOUNTER — Ambulatory Visit (INDEPENDENT_AMBULATORY_CARE_PROVIDER_SITE_OTHER): Payer: Managed Care, Other (non HMO)

## 2014-09-03 ENCOUNTER — Ambulatory Visit (INDEPENDENT_AMBULATORY_CARE_PROVIDER_SITE_OTHER): Payer: Managed Care, Other (non HMO) | Admitting: Internal Medicine

## 2014-09-03 VITALS — BP 110/68 | HR 65 | Temp 97.7°F | Resp 16 | Ht 63.0 in | Wt 155.5 lb

## 2014-09-03 DIAGNOSIS — R51 Headache: Secondary | ICD-10-CM

## 2014-09-03 DIAGNOSIS — R5383 Other fatigue: Secondary | ICD-10-CM

## 2014-09-03 DIAGNOSIS — R5381 Other malaise: Secondary | ICD-10-CM

## 2014-09-03 DIAGNOSIS — J339 Nasal polyp, unspecified: Secondary | ICD-10-CM

## 2014-09-03 DIAGNOSIS — J329 Chronic sinusitis, unspecified: Secondary | ICD-10-CM

## 2014-09-03 DIAGNOSIS — J309 Allergic rhinitis, unspecified: Secondary | ICD-10-CM

## 2014-09-03 LAB — POCT CBC
Granulocyte percent: 53 %G (ref 37–80)
HCT, POC: 39.6 % (ref 37.7–47.9)
Hemoglobin: 12.9 g/dL (ref 12.2–16.2)
LYMPH, POC: 2.9 (ref 0.6–3.4)
MCH: 30.3 pg (ref 27–31.2)
MCHC: 32.5 g/dL (ref 31.8–35.4)
MCV: 93.1 fL (ref 80–97)
MID (CBC): 0.5 (ref 0–0.9)
MPV: 7.1 fL (ref 0–99.8)
PLATELET COUNT, POC: 293 10*3/uL (ref 142–424)
POC Granulocyte: 3.9 (ref 2–6.9)
POC LYMPH PERCENT: 40.4 %L (ref 10–50)
POC MID %: 6.6 % (ref 0–12)
RBC: 4.26 M/uL (ref 4.04–5.48)
RDW, POC: 13.9 %
WBC: 7.3 10*3/uL (ref 4.6–10.2)

## 2014-09-03 LAB — POCT SEDIMENTATION RATE: POCT SED RATE: 53 mm/hr — AB (ref 0–22)

## 2014-09-03 MED ORDER — FLUTICASONE PROPIONATE 50 MCG/ACT NA SUSP
2.0000 | Freq: Every day | NASAL | Status: DC
Start: 2014-09-03 — End: 2019-09-03

## 2014-09-03 MED ORDER — AMOXICILLIN 500 MG PO CAPS: 1000.0000 mg | ORAL_CAPSULE | Freq: Two times a day (BID) | ORAL | Status: DC

## 2014-09-03 MED ORDER — HYDROCODONE-ACETAMINOPHEN 5-325 MG PO TABS: 1.0000 | ORAL_TABLET | Freq: Four times a day (QID) | ORAL | Status: DC | PRN

## 2014-09-03 MED ORDER — PREDNISONE 10 MG PO TABS: ORAL_TABLET | ORAL | Status: DC

## 2014-09-03 NOTE — Progress Notes (Signed)
   Subjective:    Patient ID: Morgan [name], female    DOB: April 03, 1951, 63 y.o.   MRN: [id number]  HPI CO HA, allergys, fatigue , maxillary pain greater than frontal pain. Occ has thick foul discharge or post nasal drip. Some nite sweats, fatigue progressive. Does have a family doctor.   Review of Systems     Objective:   Physical Exam  Vitals reviewed. Constitutional: She is oriented to person, place, and time. She appears well-developed and well-nourished. No distress.  HENT:  Head: Normocephalic and atraumatic.  Right Ear: External ear normal.  Left Ear: External ear normal.  Nose: Mucosal edema, rhinorrhea and sinus tenderness present. Right sinus exhibits maxillary sinus tenderness and frontal sinus tenderness. Left sinus exhibits maxillary sinus tenderness and frontal sinus tenderness.  Mouth/Throat: Oropharynx is clear and moist.  Nasal polp right nostril visible  Pulmonary/Chest: Effort normal.  Neurological: She is alert and oriented to person, place, and time. She exhibits normal muscle tone. Coordination normal.  Skin: No rash noted.  Psychiatric: She has a normal mood and affect. Her behavior is normal. Judgment and thought content normal.   Results for orders placed in visit on 09/03/14  POCT CBC      Result Value Ref Range   WBC 7.3  4.6 - 10.2 K/uL   Lymph, poc 2.9  0.6 - 3.4   POC LYMPH PERCENT 40.4  10 - 50 %L   MID (cbc) 0.5  0 - 0.9   POC MID % 6.6  0 - 12 %M   POC Granulocyte 3.9  2 - 6.9   Granulocyte percent 53.0  37 - 80 %G   RBC 4.26  4.04 - 5.48 M/uL   Hemoglobin 12.9  12.2 - 16.2 g/dL   HCT, POC 39.6  37.7 - 47.9 %   MCV 93.1  80 - 97 fL   MCH, POC 30.3  27 - 31.2 pg   MCHC 32.5  31.8 - 35.4 g/dL   RDW, POC 13.9     Platelet Count, POC 293  142 - 424 K/uL   MPV 7.1  0 - 99.8 fL      [facility] reading (PRIMARY) by  Dr Elder Cyphers polyp density on right, sinuses appear normal       Assessment & Plan:  Amoxil/Fluticasone/Prednisone taper Vicodin  prn

## 2014-09-03 NOTE — Patient Instructions (Signed)
Allergic Rhinitis Allergic rhinitis is when the mucous membranes in the nose respond to allergens. Allergens are particles in the air that cause your body to have an allergic reaction. This causes you to release allergic antibodies. Through a chain of events, these eventually cause you to release histamine into the blood stream. Although meant to protect the body, it is this release of histamine that causes your discomfort, such as frequent sneezing, congestion, and an itchy, runny nose.  CAUSES  Seasonal allergic rhinitis (hay fever) is caused by pollen allergens that may come from grasses, trees, and weeds. Year-round allergic rhinitis (perennial allergic rhinitis) is caused by allergens such as house dust mites, pet dander, and mold spores.  SYMPTOMS   Nasal stuffiness (congestion).  Itchy, runny nose with sneezing and tearing of the eyes. DIAGNOSIS  Your health care provider can help you determine the allergen or allergens that trigger your symptoms. If you and your health care provider are unable to determine the allergen, skin or blood testing may be used. TREATMENT  Allergic rhinitis does not have a cure, but it can be controlled by:  Medicines and allergy shots (immunotherapy).  Avoiding the allergen. Hay fever may often be treated with antihistamines in pill or nasal spray forms. Antihistamines block the effects of histamine. There are over-the-counter medicines that may help with nasal congestion and swelling around the eyes. Check with your health care provider before taking or giving this medicine.  If avoiding the allergen or the medicine prescribed do not work, there are many new medicines your health care provider can prescribe. Stronger medicine may be used if initial measures are ineffective. Desensitizing injections can be used if medicine and avoidance does not work. Desensitization is when a patient is given ongoing shots until the body becomes less sensitive to the allergen.  Make sure you follow up with your health care provider if problems continue. HOME CARE INSTRUCTIONS It is not possible to completely avoid allergens, but you can reduce your symptoms by taking steps to limit your exposure to them. It helps to know exactly what you are allergic to so that you can avoid your specific triggers. SEEK MEDICAL CARE IF:   You have a fever.  You develop a cough that does not stop easily (persistent).  You have shortness of breath.  You start wheezing.  Symptoms interfere with normal daily activities. Document Released: 09/10/2001 Document Revised: 12/21/2013 Document Reviewed: 08/23/2013 Exit[employer] Patient Information 2015 [employer]. This information is not intended to replace advice given to you by your health care provider. Make sure you discuss any questions you have with your health care provider. Sinusitis Sinusitis is redness, soreness, and inflammation of the paranasal sinuses. Paranasal sinuses are air pockets within the bones of your face (beneath the eyes, the middle of the forehead, or above the eyes). In healthy paranasal sinuses, mucus is able to drain out, and air is able to circulate through them by way of your nose. However, when your paranasal sinuses are inflamed, mucus and air can become trapped. This can allow bacteria and other germs to grow and cause infection. Sinusitis can develop quickly and last only a short time (acute) or continue over a long period (chronic). Sinusitis that lasts for more than 12 weeks is considered chronic.  CAUSES  Causes of sinusitis include:  Allergies.  Structural abnormalities, such as displacement of the cartilage that separates your nostrils (deviated septum), which can decrease the air flow through your nose and sinuses and affect sinus   drainage.  Functional abnormalities, such as when the small hairs (cilia) that line your sinuses and help remove mucus do not work properly or are not present. SIGNS AND  SYMPTOMS  Symptoms of acute and chronic sinusitis are the same. The primary symptoms are pain and pressure around the affected sinuses. Other symptoms include:  Upper toothache.  Earache.  Headache.  Bad breath.  Decreased sense of smell and taste.  A cough, which worsens when you are lying flat.  Fatigue.  Fever.  Thick drainage from your nose, which often is green and may contain pus (purulent).  Swelling and warmth over the affected sinuses. DIAGNOSIS  Your health care provider will perform a physical exam. During the exam, your health care provider may:  Look in your nose for signs of abnormal growths in your nostrils (nasal polyps).  Tap over the affected sinus to check for signs of infection.  View the inside of your sinuses (endoscopy) using an imaging device that has a light attached (endoscope). If your health care provider suspects that you have chronic sinusitis, one or more of the following tests may be recommended:  Allergy tests.  Nasal culture. A sample of mucus is taken from your nose, sent to a lab, and screened for bacteria.  Nasal cytology. A sample of mucus is taken from your nose and examined by your health care provider to determine if your sinusitis is related to an allergy. TREATMENT  Most cases of acute sinusitis are related to a viral infection and will resolve on their own within 10 days. Sometimes medicines are prescribed to help relieve symptoms (pain medicine, decongestants, nasal steroid sprays, or saline sprays).  However, for sinusitis related to a bacterial infection, your health care provider will prescribe antibiotic medicines. These are medicines that will help kill the bacteria causing the infection.  Rarely, sinusitis is caused by a fungal infection. In theses cases, your health care provider will prescribe antifungal medicine. For some cases of chronic sinusitis, surgery is needed. Generally, these are cases in which sinusitis recurs  more than 3 times per year, despite other treatments. HOME CARE INSTRUCTIONS   Drink plenty of water. Water helps thin the mucus so your sinuses can drain more easily.  Use a humidifier.  Inhale steam 3 to 4 times a day (for example, sit in the bathroom with the shower running).  Apply a warm, moist washcloth to your face 3 to 4 times a day, or as directed by your health care provider.  Use saline nasal sprays to help moisten and clean your sinuses.  Take medicines only as directed by your health care provider.  If you were prescribed either an antibiotic or antifungal medicine, finish it all even if you start to feel better. SEEK IMMEDIATE MEDICAL CARE IF:  You have increasing pain or severe headaches.  You have nausea, vomiting, or drowsiness.  You have swelling around your face.  You have vision problems.  You have a stiff neck.  You have difficulty breathing. MAKE SURE YOU:   Understand these instructions.  Will watch your condition.  Will get help right away if you are not doing well or get worse. Document Released: 12/16/2005 Document Revised: 05/02/2014 Document Reviewed: 12/31/2011 Exit[employer] Patient Information 2015 [employer]. This information is not intended to replace advice given to you by your health care provider. Make sure you discuss any questions you have with your health care provider.  

## 2014-09-13 ENCOUNTER — Ambulatory Visit (INDEPENDENT_AMBULATORY_CARE_PROVIDER_SITE_OTHER): Payer: Medicare HMO | Admitting: Family Medicine

## 2014-09-13 ENCOUNTER — Encounter: Payer: Self-pay | Admitting: Family Medicine

## 2014-09-13 VITALS — BP 149/64 | HR 71 | Temp 98.1°F | Ht 63.0 in | Wt 155.0 lb

## 2014-09-13 DIAGNOSIS — Z23 Encounter for immunization: Secondary | ICD-10-CM

## 2014-09-13 DIAGNOSIS — E663 Overweight: Secondary | ICD-10-CM

## 2014-09-13 DIAGNOSIS — R739 Hyperglycemia, unspecified: Secondary | ICD-10-CM

## 2014-09-13 DIAGNOSIS — R109 Unspecified abdominal pain: Secondary | ICD-10-CM

## 2014-09-13 DIAGNOSIS — J0191 Acute recurrent sinusitis, unspecified: Secondary | ICD-10-CM

## 2014-09-13 DIAGNOSIS — IMO0001 Reserved for inherently not codable concepts without codable children: Secondary | ICD-10-CM

## 2014-09-13 DIAGNOSIS — J019 Acute sinusitis, unspecified: Secondary | ICD-10-CM

## 2014-09-13 DIAGNOSIS — R7309 Other abnormal glucose: Secondary | ICD-10-CM

## 2014-09-13 DIAGNOSIS — E785 Hyperlipidemia, unspecified: Secondary | ICD-10-CM

## 2014-09-13 DIAGNOSIS — E039 Hypothyroidism, unspecified: Secondary | ICD-10-CM

## 2014-09-13 DIAGNOSIS — R030 Elevated blood-pressure reading, without diagnosis of hypertension: Secondary | ICD-10-CM

## 2014-09-13 MED ORDER — CEFDINIR 300 MG PO CAPS: 300.0000 mg | ORAL_CAPSULE | Freq: Two times a day (BID) | ORAL | Status: AC

## 2014-09-13 MED ORDER — METHYLPREDNISOLONE (PAK) 4 MG PO TABS: ORAL_TABLET | ORAL | Status: DC

## 2014-09-13 NOTE — Progress Notes (Signed)
Pre visit review using our clinic review tool, if applicable. No additional management support is needed unless otherwise documented below in the visit note. 

## 2014-09-13 NOTE — Patient Instructions (Signed)
Needs labs ordered tonight tomorrow  Sinusitis Sinusitis is redness, soreness, and inflammation of the paranasal sinuses. Paranasal sinuses are air pockets within the bones of your face (beneath the eyes, the middle of the forehead, or above the eyes). In healthy paranasal sinuses, mucus is able to drain out, and air is able to circulate through them by way of your nose. However, when your paranasal sinuses are inflamed, mucus and air can become trapped. This can allow bacteria and other germs to grow and cause infection. Sinusitis can develop quickly and last only a short time (acute) or continue over a long period (chronic). Sinusitis that lasts for more than 12 weeks is considered chronic.  CAUSES  Causes of sinusitis include:  Allergies.  Structural abnormalities, such as displacement of the cartilage that separates your nostrils (deviated septum), which can decrease the air flow through your nose and sinuses and affect sinus drainage.  Functional abnormalities, such as when the small hairs (cilia) that line your sinuses and help remove mucus do not work properly or are not present. SIGNS AND SYMPTOMS  Symptoms of acute and chronic sinusitis are the same. The primary symptoms are pain and pressure around the affected sinuses. Other symptoms include:  Upper toothache.  Earache.  Headache.  Bad breath.  Decreased sense of smell and taste.  A cough, which worsens when you are lying flat.  Fatigue.  Fever.  Thick drainage from your nose, which often is green and may contain pus (purulent).  Swelling and warmth over the affected sinuses. DIAGNOSIS  Your health care provider will perform a physical exam. During the exam, your health care provider may:  Look in your nose for signs of abnormal growths in your nostrils (nasal polyps).  Tap over the affected sinus to check for signs of infection.  View the inside of your sinuses (endoscopy) using an imaging device that has a light  attached (endoscope). If your health care provider suspects that you have chronic sinusitis, one or more of the following tests may be recommended:  Allergy tests.  Nasal culture. A sample of mucus is taken from your nose, sent to a lab, and screened for bacteria.  Nasal cytology. A sample of mucus is taken from your nose and examined by your health care provider to determine if your sinusitis is related to an allergy. TREATMENT  Most cases of acute sinusitis are related to a viral infection and will resolve on their own within 10 days. Sometimes medicines are prescribed to help relieve symptoms (pain medicine, decongestants, nasal steroid sprays, or saline sprays).  However, for sinusitis related to a bacterial infection, your health care provider will prescribe antibiotic medicines. These are medicines that will help kill the bacteria causing the infection.  Rarely, sinusitis is caused by a fungal infection. In theses cases, your health care provider will prescribe antifungal medicine. For some cases of chronic sinusitis, surgery is needed. Generally, these are cases in which sinusitis recurs more than 3 times per year, despite other treatments. HOME CARE INSTRUCTIONS   Drink plenty of water. Water helps thin the mucus so your sinuses can drain more easily.  Use a humidifier.  Inhale steam 3 to 4 times a day (for example, sit in the bathroom with the shower running).  Apply a warm, moist washcloth to your face 3 to 4 times a day, or as directed by your health care provider.  Use saline nasal sprays to help moisten and clean your sinuses.  Take medicines only as directed  by your health care provider.  If you were prescribed either an antibiotic or antifungal medicine, finish it all even if you start to feel better. SEEK IMMEDIATE MEDICAL CARE IF:  You have increasing pain or severe headaches.  You have nausea, vomiting, or drowsiness.  You have swelling around your face.  You  have vision problems.  You have a stiff neck.  You have difficulty breathing. MAKE SURE YOU:   Understand these instructions.  Will watch your condition.  Will get help right away if you are not doing well or get worse. Document Released: 12/16/2005 Document Revised: 05/02/2014 Document Reviewed: 12/31/2011 [facility] Patient Information 2015 [place], [place]. This information is not intended to replace advice given to you by your health care provider. Make sure you discuss any questions you have with your health care provider.

## 2014-09-14 ENCOUNTER — Other Ambulatory Visit: Payer: Medicare HMO

## 2014-09-14 LAB — URINALYSIS, ROUTINE W REFLEX MICROSCOPIC
Bilirubin Urine: NEGATIVE
HGB URINE DIPSTICK: NEGATIVE
KETONES UR: NEGATIVE
Nitrite: NEGATIVE
Specific Gravity, Urine: 1.02 (ref 1.000–1.030)
Total Protein, Urine: NEGATIVE
UROBILINOGEN UA: 0.2 (ref 0.0–1.0)
Urine Glucose: NEGATIVE
pH: 6 (ref 5.0–8.0)

## 2014-09-14 LAB — HEMOGLOBIN A1C: Hgb A1c MFr Bld: 6.1 % (ref 4.6–6.5)

## 2014-09-15 LAB — HEPATIC FUNCTION PANEL
ALK PHOS: 78 U/L (ref 39–117)
ALT: 32 U/L (ref 0–35)
AST: 32 U/L (ref 0–37)
Albumin: 3.9 g/dL (ref 3.5–5.2)
Bilirubin, Direct: 0.1 mg/dL (ref 0.0–0.3)
TOTAL PROTEIN: 7.3 g/dL (ref 6.0–8.3)
Total Bilirubin: 0.4 mg/dL (ref 0.2–1.2)

## 2014-09-15 LAB — RENAL FUNCTION PANEL
ALBUMIN: 3.9 g/dL (ref 3.5–5.2)
BUN: 18 mg/dL (ref 6–23)
CALCIUM: 9.3 mg/dL (ref 8.4–10.5)
CHLORIDE: 106 meq/L (ref 96–112)
CO2: 27 mEq/L (ref 19–32)
Creatinine, Ser: 0.7 mg/dL (ref 0.4–1.2)
GFR: 87.74 mL/min (ref >60.00)
Glucose, Bld: 99 mg/dL (ref 70–99)
POTASSIUM: 3.8 meq/L (ref 3.5–5.1)
Phosphorus: 3.7 mg/dL (ref 2.3–4.6)
Sodium: 139 mEq/L (ref 135–145)

## 2014-09-15 LAB — CBC
HEMATOCRIT: 37.8 % (ref 36.0–46.0)
HEMOGLOBIN: 12.6 g/dL (ref 12.0–15.0)
MCHC: 33.2 g/dL (ref 30.0–36.0)
MCV: 92.5 fl (ref 78.0–100.0)
Platelets: 285 10*3/uL (ref 150.0–400.0)
RBC: 4.09 Mil/uL (ref 3.87–5.11)
RDW: 14.3 % (ref 11.5–15.5)
WBC: 8.4 10*3/uL (ref 4.0–10.5)

## 2014-09-15 LAB — LIPID PANEL
CHOL/HDL RATIO: 3
Cholesterol: 175 mg/dL (ref 0–200)
HDL: 56.8 mg/dL (ref >39.00)
LDL Cholesterol: 105 mg/dL — ABNORMAL HIGH (ref 0–99)
NonHDL: 118.2
Triglycerides: 66 mg/dL (ref 0.0–149.0)
VLDL: 13.2 mg/dL (ref 0.0–40.0)

## 2014-09-15 LAB — TSH: TSH: 0.35 u[IU]/mL (ref 0.35–4.50)

## 2014-09-15 LAB — SEDIMENTATION RATE: Sed Rate: 2 mm/hr (ref 0–22)

## 2014-09-15 LAB — C-REACTIVE PROTEIN: CRP: 1.7 mg/dL (ref 0.5–20.0)

## 2014-09-18 ENCOUNTER — Encounter: Payer: Self-pay | Admitting: Family Medicine

## 2014-09-18 DIAGNOSIS — R030 Elevated blood-pressure reading, without diagnosis of hypertension: Secondary | ICD-10-CM

## 2014-09-18 DIAGNOSIS — J019 Acute sinusitis, unspecified: Secondary | ICD-10-CM | POA: Insufficient documentation

## 2014-09-18 DIAGNOSIS — IMO0001 Reserved for inherently not codable concepts without codable children: Secondary | ICD-10-CM | POA: Insufficient documentation

## 2014-09-18 NOTE — Assessment & Plan Note (Signed)
Encouraged DASH diet, decrease po intake and increase exercise as tolerated. Needs 7-8 hours of sleep nightly. Avoid trans fats, eat small, frequent meals every 4-5 hours with lean proteins, complex carbs and healthy fats. Minimize simple carbs, GMO foods. 

## 2014-09-18 NOTE — Assessment & Plan Note (Signed)
minimize simple carbs. Increase exercise as tolerated. Continue current meds  

## 2014-09-18 NOTE — Assessment & Plan Note (Signed)
Well controlled. Encouraged heart healthy diet such as the DASH diet and exercise as tolerated.  

## 2014-09-18 NOTE — Assessment & Plan Note (Signed)
Tolerating statin, encouraged heart healthy diet, avoid trans fats, minimize simple carbs and saturated fats. Increase exercise as tolerated 

## 2014-09-18 NOTE — Assessment & Plan Note (Signed)
startedon antibiotics, mucinex, probiotics

## 2014-09-18 NOTE — Progress Notes (Signed)
Patient ID: Morgan [name], female   DOB: December 11, 1951, 63 y.o.   MRN: [id number] Morgan [name] [id number] 1951-06-24 09/18/2014      Progress Note-Follow Up  Subjective  Chief Complaint  Chief Complaint  Patient presents with  . not feeling well lately  . Injections    flu    HPI  Patient is a 63 year old female in today for routine medical care. Patient's is noting some: Sinus having increased headaches occurred over the course of amoxicillin and did have some improvement but her symptoms have not worsened struggling malaise and myalgias as well as pain and congestion. Did have a one-day period of nausea vomiting and diarrhea but that is resolved. Reports her sedimentation rate was vertigo 64. Low-grade fevers and chills continue. Denies CP/palp/SOB or GU c/o. Taking meds as prescribed  Past Medical History  Diagnosis Date  . Thyroid disease   . Arthritis   . Allergy   . History of colonic polyps   . Mitral regurgitation   . Diastolic dysfunction     neg nuclear stress 08/09/12  EPIC  . Elevated cholesterol   . Depression   . Upper respiratory infection 11/08/12    fever  with sinus congestion-   states improved  . OSA (obstructive sleep apnea)     no cpap-  per patient "mild " per study 4 years ago  . Tinnitus   . Preventative health care 10/24/2013  . Anxiety and depression 02/22/2014  . Hyperglycemia 02/22/2014  . Elevated BP 09/18/2014    Past Surgical History  Procedure Laterality Date  . 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 Polypectomy      colon  . Joint replacement  2008    rt knee  . Total knee arthroplasty  11/30/2012    Procedure: TOTAL KNEE ARTHROPLASTY;  Surgeon: Gearlean Alf, MD;  Location: [facility];  Service: Orthopedics;  Laterality: Left;    Family History  Problem Relation Age of Onset  . Heart attack Father   . Hyperlipidemia Father   . Heart disease Father   . Cancer Father     BRAIN TUMOR, malignant  . Diabetes Father     diet  controlled  . Arthritis Other   . Breast cancer Maternal Aunt   . Breast cancer Paternal Aunt   . Macular degeneration Mother   . Diabetes Brother     diet controlled  . Migraines Daughter   . Pulmonary embolism Daughter   . Diabetes Paternal Grandfather     History   Social History  . Marital Status: Married    Spouse Name: N/A    Number of Children: N/A  . Years of Education: N/A   Occupational History  . Not on file.   Social History Main Topics  . Smoking status: Never Smoker   . Smokeless tobacco: Never Used  . Alcohol Use: Yes     Comment: rare  . Drug Use: No  . Sexual Activity: Yes     Comment: lives with husband   Other Topics Concern  . Not on file   Social History Narrative  . No narrative on file    Current Outpatient Prescriptions on File Prior to Visit  Medication Sig Dispense Refill  . acyclovir (ZOVIRAX) 400 MG tablet Take 1 tablet (400 mg total) by mouth 3 (three) times daily.  15 tablet  1  . DULoxetine (CYMBALTA) 30 MG capsule Take 1 capsule (30 mg total) by  mouth every evening.  90 capsule  0  . fluticasone (FLONASE) 50 MCG/ACT nasal spray Place 2 sprays into both nostrils daily.  16 g  6  . HYDROcodone-acetaminophen (NORCO/VICODIN) 5-325 MG per tablet Take 1 tablet by mouth every 6 (six) hours as needed.  30 tablet  0  . SYNTHROID 88 MCG tablet Take 1 tablet (88 mcg total) by mouth daily before breakfast. Name brand must be dispensed  14 tablet  0   No current facility-administered medications on file prior to visit.    Allergies  Allergen Reactions  . Atorvastatin     myalgias  . Simvastatin     myalgia  . Sulfonamide Derivatives     REACTION: Rash    Review of Systems  Review of Systems  Constitutional: Positive for malaise/fatigue. Negative for fever.  HENT: Positive for congestion.   Eyes: Negative for discharge.  Respiratory: Positive for cough and shortness of breath.   Cardiovascular: Negative for chest pain, palpitations  and leg swelling.  Gastrointestinal: Negative for nausea, abdominal pain and diarrhea.  Genitourinary: Negative for dysuria.  Musculoskeletal: Positive for myalgias. Negative for falls.  Skin: Negative for rash.  Neurological: Positive for headaches. Negative for loss of consciousness.  Endo/Heme/Allergies: Negative for polydipsia.  Psychiatric/Behavioral: Negative for depression and suicidal ideas. The patient is not nervous/anxious and does not have insomnia.     Objective  BP 149/64  Pulse 71  Temp(Src) 98.1 F (36.7 C) (Oral)  Ht 5\' 3"  (1.6 m)  Wt 155 lb (70.308 kg)  BMI 27.46 kg/m2  SpO2 100%  Physical Exam  Physical Exam  Constitutional: She is oriented to person, place, and time and well-developed, well-nourished, and in no distress. No distress.  HENT:  Head: Normocephalic and atraumatic.  Eyes: Conjunctivae are normal.  Neck: Neck supple. No thyromegaly present.  Cardiovascular: Normal rate, regular rhythm and normal heart sounds.   No murmur heard. Pulmonary/Chest: Effort normal and breath sounds normal. She has no wheezes.  Abdominal: She exhibits no distension and no mass.  Musculoskeletal: She exhibits no edema.  Lymphadenopathy:    She has no cervical adenopathy.  Neurological: She is alert and oriented to person, place, and time.  Skin: Skin is warm and dry. No rash noted. She is not diaphoretic.  Psychiatric: Memory, affect and judgment normal.    Lab Results  Component Value Date   TSH 0.35 09/14/2014   Lab Results  Component Value Date   WBC 8.4 09/14/2014   HGB 12.6 09/14/2014   HCT 37.8 09/14/2014   MCV 92.5 09/14/2014   PLT 285.0 09/14/2014   Lab Results  Component Value Date   CREATININE 0.7 09/14/2014   BUN 18 09/14/2014   NA 139 09/14/2014   K 3.8 09/14/2014   CL 106 09/14/2014   CO2 27 09/14/2014   Lab Results  Component Value Date   ALT 32 09/14/2014   AST 32 09/14/2014   ALKPHOS 78 09/14/2014   BILITOT 0.4 09/14/2014   Lab Results   Component Value Date   CHOL 175 09/14/2014   Lab Results  Component Value Date   HDL 56.80 09/14/2014   Lab Results  Component Value Date   LDLCALC 105* 09/14/2014   Lab Results  Component Value Date   TRIG 66.0 09/14/2014   Lab Results  Component Value Date   CHOLHDL 3 09/14/2014     Assessment & Plan  Overweight Encouraged DASH diet, decrease po intake and increase exercise as tolerated. Needs 7-8 hours  of sleep nightly. Avoid trans fats, eat small, frequent meals every 4-5 hours with lean proteins, complex carbs and healthy fats. Minimize simple carbs, GMO foods.  Acute infection of nasal sinus startedon antibiotics, mucinex, probiotics  Hyperglycemia minimize simple carbs. Increase exercise as tolerated. Continue current meds  Elevated BP Well controlled. Encouraged heart healthy diet such as the DASH diet and exercise as tolerated.   HYPERLIPIDEMIA NEC/NOS Tolerating statin, encouraged heart healthy diet, avoid trans fats, minimize simple carbs and saturated fats. Increase exercise as tolerated

## 2014-09-19 ENCOUNTER — Other Ambulatory Visit: Payer: Self-pay | Admitting: Family Medicine

## 2014-09-20 ENCOUNTER — Ambulatory Visit: Payer: BC Managed Care – PPO | Admitting: Family Medicine

## 2014-09-20 ENCOUNTER — Telehealth: Payer: Self-pay | Admitting: Family Medicine

## 2014-09-20 NOTE — Telephone Encounter (Signed)
Inform pt that lab results were mailed to patient. But this is what Dr Charlett Blake said: Notify labs normal, cholesterol not perfect but improved. Keep up the good work. Urine looks unremarkable   Also, they are probably needing a Prior Authorization form completed for the Duloxetine. Pharmacy needs to send [place] that information

## 2014-09-20 NOTE — Telephone Encounter (Signed)
Request Lab work results and wants to speak to someone about refill on Duloxetine and see why it was denied

## 2014-09-21 NOTE — Telephone Encounter (Signed)
Left message for patient to return my call.

## 2014-09-26 NOTE — Telephone Encounter (Signed)
Pt states she would like a call back she was out of town, pt states she has not received her results in the mail, however she prefer to be called with the results because she has questions.

## 2014-09-27 NOTE — Telephone Encounter (Signed)
Pt informed and voiced understanding. Pt states she didn't have another question.

## 2014-10-10 ENCOUNTER — Ambulatory Visit (INDEPENDENT_AMBULATORY_CARE_PROVIDER_SITE_OTHER): Payer: Medicare HMO | Admitting: Family Medicine

## 2014-10-10 ENCOUNTER — Encounter: Payer: Self-pay | Admitting: Family Medicine

## 2014-10-10 VITALS — BP 143/72 | HR 75 | Temp 98.7°F | Ht 63.0 in | Wt 157.2 lb

## 2014-10-10 DIAGNOSIS — E663 Overweight: Secondary | ICD-10-CM

## 2014-10-10 DIAGNOSIS — E785 Hyperlipidemia, unspecified: Secondary | ICD-10-CM

## 2014-10-10 DIAGNOSIS — E039 Hypothyroidism, unspecified: Secondary | ICD-10-CM

## 2014-10-10 DIAGNOSIS — R51 Headache: Secondary | ICD-10-CM

## 2014-10-10 DIAGNOSIS — R030 Elevated blood-pressure reading, without diagnosis of hypertension: Secondary | ICD-10-CM

## 2014-10-10 DIAGNOSIS — R739 Hyperglycemia, unspecified: Secondary | ICD-10-CM

## 2014-10-10 DIAGNOSIS — R519 Headache, unspecified: Secondary | ICD-10-CM

## 2014-10-10 DIAGNOSIS — IMO0001 Reserved for inherently not codable concepts without codable children: Secondary | ICD-10-CM

## 2014-10-10 LAB — SEDIMENTATION RATE: SED RATE: 48 mm/h — AB (ref 0–22)

## 2014-10-10 MED ORDER — OXYCODONE-ACETAMINOPHEN 5-325 MG PO TABS: 1.0000 | ORAL_TABLET | Freq: Three times a day (TID) | ORAL | Status: DC | PRN

## 2014-10-10 MED ORDER — ROSUVASTATIN CALCIUM 5 MG PO TABS: 5.0000 mg | ORAL_TABLET | Freq: Every day | ORAL | Status: DC

## 2014-10-10 NOTE — Progress Notes (Signed)
Pre visit review using our clinic review tool, if applicable. No additional management support is needed unless otherwise documented below in the visit note. 

## 2014-10-10 NOTE — Patient Instructions (Signed)

## 2014-10-16 ENCOUNTER — Encounter: Payer: Self-pay | Admitting: Family Medicine

## 2014-10-16 NOTE — Progress Notes (Signed)
Patient ID: Morgan [name], female   DOB: July 04, 1951, 63 y.o.   MRN: [id number] REA KALAMA [id number] April 14, 1951 10/16/2014      Progress Note-Follow Up  Subjective  Chief Complaint  Chief Complaint  Patient presents with  . Follow-up    4 week    HPI  Patient is a 63 year old female in today for routine medical care. She is in today for f/u exam. Reports doing fairly well. No recent illness. She does note file run in with congestion and headache Sudafed as helping. He does note since stopping atorvastatin her fatigue and brain cough have improved. When she gets an occasional headache Excedrin Migraine is helpful. Oxycodone is mildly slow. Denies CP/palp/SOB/HA/congestion/fevers/GI or GU c/o. Taking meds as prescribed. Still struggling with the stress of her husband's chronic pain  Past Medical History  Diagnosis Date  . Thyroid disease   . Arthritis   . Allergy   . History of colonic polyps   . Mitral regurgitation   . Diastolic dysfunction     neg nuclear stress 08/09/12  EPIC  . Elevated cholesterol   . Depression   . Upper respiratory infection 11/08/12    fever  with sinus congestion-   states improved  . OSA (obstructive sleep apnea)     no cpap-  per patient "mild " per study 4 years ago  . Tinnitus   . Preventative health care 10/24/2013  . Anxiety and depression 02/22/2014  . Hyperglycemia 02/22/2014  . Elevated BP 09/18/2014    Past Surgical History  Procedure Laterality Date  . 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 Polypectomy      colon  . Joint replacement  2008    rt knee  . Total knee arthroplasty  11/30/2012    Procedure: TOTAL KNEE ARTHROPLASTY;  Surgeon: Gearlean Alf, MD;  Location: [facility];  Service: Orthopedics;  Laterality: Left;    Family History  Problem Relation Age of Onset  . Heart attack Father   . Hyperlipidemia Father   . Heart disease Father   . Cancer Father     BRAIN TUMOR, malignant  . Diabetes Father     diet  controlled  . Arthritis Other   . Breast cancer Maternal Aunt   . Breast cancer Paternal Aunt   . Macular degeneration Mother   . Diabetes Brother     diet controlled  . Migraines Daughter   . Pulmonary embolism Daughter   . Diabetes Paternal Grandfather     History   Social History  . Marital Status: Married    Spouse Name: N/A    Number of Children: N/A  . Years of Education: N/A   Occupational History  . Not on file.   Social History Main Topics  . Smoking status: Never Smoker   . Smokeless tobacco: Never Used  . Alcohol Use: Yes     Comment: rare  . Drug Use: No  . Sexual Activity: Yes     Comment: lives with husband   Other Topics Concern  . Not on file   Social History Narrative  . No narrative on file    Current Outpatient Prescriptions on File Prior to Visit  Medication Sig Dispense Refill  . DULoxetine (CYMBALTA) 30 MG capsule TAKE 1 CAPSULE EVERY       EVENING  90 capsule  0  . fluticasone (FLONASE) 50 MCG/ACT nasal spray Place 2 sprays into both nostrils daily.  [place]  g  6  . SYNTHROID 88 MCG tablet Take 1 tablet (88 mcg total) by mouth daily before breakfast. Name brand must be dispensed  14 tablet  0  . acyclovir (ZOVIRAX) 400 MG tablet Take 1 tablet (400 mg total) by mouth 3 (three) times daily.  15 tablet  1   No current facility-administered medications on file prior to visit.    Allergies  Allergen Reactions  . Atorvastatin     myalgias  . Simvastatin     myalgia  . Sulfonamide Derivatives     REACTION: Rash    Review of Systems  Review of Systems  Constitutional: Negative for fever and malaise/fatigue.  HENT: Negative for congestion.   Eyes: Negative for discharge.  Respiratory: Negative for shortness of breath.   Cardiovascular: Negative for chest pain, palpitations and leg swelling.  Gastrointestinal: Negative for nausea, abdominal pain and diarrhea.  Genitourinary: Negative for dysuria.  Musculoskeletal: Negative for falls.   Skin: Negative for rash.  Neurological: Negative for loss of consciousness and headaches.  Endo/Heme/Allergies: Negative for polydipsia.  Psychiatric/Behavioral: Negative for depression and suicidal ideas. The patient is nervous/anxious. The patient does not have insomnia.     Objective  BP 143/72  Pulse 75  Temp(Src) 98.7 F (37.1 C) (Oral)  Ht 5\' 3"  (1.6 m)  Wt 157 lb 3.2 oz (71.305 kg)  BMI 27.85 kg/m2  SpO2 100%  Physical Exam  Physical Exam  Constitutional: She is oriented to person, place, and time and well-developed, well-nourished, and in no distress. No distress.  HENT:  Head: Normocephalic and atraumatic.  Eyes: Conjunctivae are normal.  Neck: Neck supple. No thyromegaly present.  Cardiovascular: Normal rate, regular rhythm and normal heart sounds.   No murmur heard. Pulmonary/Chest: Effort normal and breath sounds normal. She has no wheezes.  Abdominal: She exhibits no distension and no mass.  Musculoskeletal: She exhibits no edema.  Lymphadenopathy:    She has no cervical adenopathy.  Neurological: She is alert and oriented to person, place, and time.  Skin: Skin is warm and dry. No rash noted. She is not diaphoretic.  Psychiatric: Memory, affect and judgment normal.    Lab Results  Component Value Date   TSH 0.35 09/14/2014   Lab Results  Component Value Date   WBC 8.4 09/14/2014   HGB 12.6 09/14/2014   HCT 37.8 09/14/2014   MCV 92.5 09/14/2014   PLT 285.0 09/14/2014   Lab Results  Component Value Date   CREATININE 0.7 09/14/2014   BUN 18 09/14/2014   NA 139 09/14/2014   K 3.8 09/14/2014   CL 106 09/14/2014   CO2 27 09/14/2014   Lab Results  Component Value Date   ALT 32 09/14/2014   AST 32 09/14/2014   ALKPHOS 78 09/14/2014   BILITOT 0.4 09/14/2014   Lab Results  Component Value Date   CHOL 175 09/14/2014   Lab Results  Component Value Date   HDL 56.80 09/14/2014   Lab Results  Component Value Date   LDLCALC 105* 09/14/2014   Lab Results   Component Value Date   TRIG 66.0 09/14/2014   Lab Results  Component Value Date   CHOLHDL 3 09/14/2014     Assessment & Plan  Hypothyroidism On Levothyroxine, continue to monitor  Overweight Encouraged DASH diet, decrease po intake and increase exercise as tolerated. Needs 7-8 hours of sleep nightly. Avoid trans fats, eat small, frequent meals every 4-5 hours with lean proteins, complex carbs and healthy fats. Minimize simple  carbs, GMO foods.  Hyperlipidemia Tolerated Crestor,  Will continue this. Encouraged heart healthy diet, increase exercise, avoid trans fats, consider a krill oil cap daily  Hyperglycemia hgba1c acceptable, minimize simple carbs. Increase exercise as tolerated.  Elevated BP Well controlled, no changes to meds. Encouraged heart healthy diet such as the DASH diet and exercise as tolerated.

## 2014-10-16 NOTE — Assessment & Plan Note (Signed)
Encouraged DASH diet, decrease po intake and increase exercise as tolerated. Needs 7-8 hours of sleep nightly. Avoid trans fats, eat small, frequent meals every 4-5 hours with lean proteins, complex carbs and healthy fats. Minimize simple carbs, GMO foods. 

## 2014-10-16 NOTE — Assessment & Plan Note (Signed)
On Levothyroxine, continue to monitor 

## 2014-10-16 NOTE — Assessment & Plan Note (Signed)
Well controlled, no changes to meds. Encouraged heart healthy diet such as the DASH diet and exercise as tolerated.  °

## 2014-10-16 NOTE — Assessment & Plan Note (Signed)
hgba1c acceptable, minimize simple carbs. Increase exercise as tolerated.  

## 2014-10-16 NOTE — Assessment & Plan Note (Signed)
Tolerated Crestor,  Will continue this. Encouraged heart healthy diet, increase exercise, avoid trans fats, consider a krill oil cap daily

## 2014-10-31 ENCOUNTER — Encounter: Payer: Self-pay | Admitting: Family Medicine

## 2014-11-08 ENCOUNTER — Other Ambulatory Visit: Payer: Self-pay

## 2014-11-08 DIAGNOSIS — Z1231 Encounter for screening mammogram for malignant neoplasm of breast: Secondary | ICD-10-CM

## 2014-12-08 ENCOUNTER — Other Ambulatory Visit: Payer: Self-pay | Admitting: Otolaryngology

## 2014-12-08 DIAGNOSIS — R51 Headache: Principal | ICD-10-CM

## 2014-12-08 DIAGNOSIS — R519 Headache, unspecified: Secondary | ICD-10-CM

## 2014-12-15 ENCOUNTER — Other Ambulatory Visit: Payer: Self-pay | Admitting: Family Medicine

## 2014-12-15 ENCOUNTER — Ambulatory Visit
Admission: RE | Admit: 2014-12-15 | Discharge: 2014-12-15 | Disposition: A | Discharge status: Home / Self Care | Payer: Medicare HMO | Source: Ambulatory Visit

## 2014-12-15 DIAGNOSIS — Z1231 Encounter for screening mammogram for malignant neoplasm of breast: Secondary | ICD-10-CM

## 2014-12-16 ENCOUNTER — Ambulatory Visit
Admission: RE | Admit: 2014-12-16 | Discharge: 2014-12-16 | Disposition: A | Discharge status: Home / Self Care | Payer: Medicare HMO | Source: Ambulatory Visit | Attending: Otolaryngology | Admitting: Otolaryngology

## 2014-12-16 DIAGNOSIS — R51 Headache: Principal | ICD-10-CM

## 2014-12-16 DIAGNOSIS — R519 Headache, unspecified: Secondary | ICD-10-CM

## 2014-12-16 MED ORDER — GADOBENATE DIMEGLUMINE 529 MG/ML IV SOLN
14.0000 mL | Freq: Once | INTRAVENOUS | Status: AC | PRN
  Administered 2014-12-16: 14 mL via INTRAVENOUS

## 2015-01-10 ENCOUNTER — Ambulatory Visit: Payer: Medicare HMO | Admitting: Family Medicine

## 2015-01-11 ENCOUNTER — Other Ambulatory Visit: Payer: Self-pay | Admitting: Family Medicine

## 2015-01-13 NOTE — Telephone Encounter (Signed)
Rx sent to the pharmacy by e-script.//AB/CMA 

## 2015-03-09 ENCOUNTER — Other Ambulatory Visit (INDEPENDENT_AMBULATORY_CARE_PROVIDER_SITE_OTHER): Payer: Medicare HMO

## 2015-03-09 ENCOUNTER — Other Ambulatory Visit: Payer: Self-pay | Admitting: Family Medicine

## 2015-03-09 DIAGNOSIS — R739 Hyperglycemia, unspecified: Secondary | ICD-10-CM

## 2015-03-09 DIAGNOSIS — R519 Headache, unspecified: Secondary | ICD-10-CM

## 2015-03-09 DIAGNOSIS — R51 Headache: Secondary | ICD-10-CM

## 2015-03-09 DIAGNOSIS — E785 Hyperlipidemia, unspecified: Secondary | ICD-10-CM

## 2015-03-09 DIAGNOSIS — IMO0001 Reserved for inherently not codable concepts without codable children: Secondary | ICD-10-CM

## 2015-03-09 DIAGNOSIS — R030 Elevated blood-pressure reading, without diagnosis of hypertension: Secondary | ICD-10-CM

## 2015-03-09 LAB — CBC
HEMATOCRIT: 36.2 % (ref 36.0–46.0)
HEMOGLOBIN: 12.3 g/dL (ref 12.0–15.0)
MCHC: 33.8 g/dL (ref 30.0–36.0)
MCV: 88.6 fl (ref 78.0–100.0)
Platelets: 292 10*3/uL (ref 150.0–400.0)
RBC: 4.09 Mil/uL (ref 3.87–5.11)
RDW: 13.5 % (ref 11.5–15.5)
WBC: 6.6 10*3/uL (ref 4.0–10.5)

## 2015-03-09 LAB — LIPID PANEL
Cholesterol: 153 mg/dL (ref 0–200)
HDL: 53 mg/dL (ref >39.00)
LDL Cholesterol: 85 mg/dL (ref 0–99)
NONHDL: 100
Total CHOL/HDL Ratio: 3
Triglycerides: 73 mg/dL (ref 0.0–149.0)
VLDL: 14.6 mg/dL (ref 0.0–40.0)

## 2015-03-09 LAB — HEPATIC FUNCTION PANEL
ALT: 20 U/L (ref 0–35)
AST: 22 U/L (ref 0–37)
Albumin: 4.3 g/dL (ref 3.5–5.2)
Alkaline Phosphatase: 90 U/L (ref 39–117)
BILIRUBIN DIRECT: 0.1 mg/dL (ref 0.0–0.3)
Total Bilirubin: 0.4 mg/dL (ref 0.2–1.2)
Total Protein: 7.3 g/dL (ref 6.0–8.3)

## 2015-03-09 LAB — RENAL FUNCTION PANEL
ALBUMIN: 4.3 g/dL (ref 3.5–5.2)
BUN: 18 mg/dL (ref 6–23)
CALCIUM: 9.6 mg/dL (ref 8.4–10.5)
CO2: 27 mEq/L (ref 19–32)
CREATININE: 0.66 mg/dL (ref 0.40–1.20)
Chloride: 105 mEq/L (ref 96–112)
GFR: 95.32 mL/min (ref >60.00)
Glucose, Bld: 108 mg/dL — ABNORMAL HIGH (ref 70–99)
POTASSIUM: 4 meq/L (ref 3.5–5.1)
Phosphorus: 3.6 mg/dL (ref 2.3–4.6)
Sodium: 138 mEq/L (ref 135–145)

## 2015-03-09 LAB — HEMOGLOBIN A1C: Hgb A1c MFr Bld: 6.1 % (ref 4.6–6.5)

## 2015-03-09 LAB — TSH: TSH: 0.18 u[IU]/mL — ABNORMAL LOW (ref 0.35–4.50)

## 2015-03-09 MED ORDER — LEVOTHYROXINE SODIUM 75 MCG PO TABS
75.0000 ug | ORAL_TABLET | Freq: Every day | ORAL | Status: DC
Start: 2015-03-09 — End: 2015-05-06

## 2015-03-15 ENCOUNTER — Telehealth: Payer: Self-pay

## 2015-03-15 NOTE — Telephone Encounter (Signed)
Medication: Review, verify sig & reconcile(including outside meds):  yes Duplicates discarded: n/a DM supply source:  n/a  Local pharmacy: [place], [place] verified: yes  Immunization Status: Prompted for insurance verification: N/a Fu vaccine--09/13/14 Tdap--12/30/05  PNA--10/22/13--DUE Shingles--08/02/11  A/P:   Changes to FH, PSH or Personal Hx: reviewed. No changes. Pap--06/2013-normal per patient [facility]--12/15/14-negative Bone Density--DUE CCS--11/15/10-multiple benign polyps; repeat in 5 years--Dr. Fuller Plan  Care Teams Updated: yes ED/Hospital/Urgent Care Visits: NO Prompted for: Updated insurance, contact information, forms: yes Remind to bring: DPR information, advance directives: yes  To Discuss with Provider: Needs refill on Crestor.

## 2015-03-16 ENCOUNTER — Encounter: Payer: Self-pay | Admitting: Family Medicine

## 2015-03-16 ENCOUNTER — Ambulatory Visit (INDEPENDENT_AMBULATORY_CARE_PROVIDER_SITE_OTHER): Payer: Medicare HMO | Admitting: Family Medicine

## 2015-03-16 VITALS — BP 116/45 | HR 80 | Temp 98.4°F | Ht 63.0 in | Wt 151.6 lb

## 2015-03-16 DIAGNOSIS — E785 Hyperlipidemia, unspecified: Secondary | ICD-10-CM | POA: Diagnosis not present

## 2015-03-16 DIAGNOSIS — M949 Disorder of cartilage, unspecified: Secondary | ICD-10-CM

## 2015-03-16 DIAGNOSIS — M8580 Other specified disorders of bone density and structure, unspecified site: Secondary | ICD-10-CM

## 2015-03-16 DIAGNOSIS — E039 Hypothyroidism, unspecified: Secondary | ICD-10-CM

## 2015-03-16 DIAGNOSIS — Z23 Encounter for immunization: Secondary | ICD-10-CM | POA: Diagnosis not present

## 2015-03-16 DIAGNOSIS — E663 Overweight: Secondary | ICD-10-CM | POA: Diagnosis not present

## 2015-03-16 DIAGNOSIS — R030 Elevated blood-pressure reading, without diagnosis of hypertension: Secondary | ICD-10-CM | POA: Diagnosis not present

## 2015-03-16 DIAGNOSIS — R739 Hyperglycemia, unspecified: Secondary | ICD-10-CM

## 2015-03-16 DIAGNOSIS — IMO0001 Reserved for inherently not codable concepts without codable children: Secondary | ICD-10-CM

## 2015-03-16 DIAGNOSIS — F419 Anxiety disorder, unspecified: Secondary | ICD-10-CM

## 2015-03-16 DIAGNOSIS — Z0000 Encounter for general adult medical examination without abnormal findings: Secondary | ICD-10-CM | POA: Diagnosis not present

## 2015-03-16 DIAGNOSIS — M899 Disorder of bone, unspecified: Secondary | ICD-10-CM | POA: Diagnosis not present

## 2015-03-16 DIAGNOSIS — F418 Other specified anxiety disorders: Secondary | ICD-10-CM

## 2015-03-16 DIAGNOSIS — F32A Depression, unspecified: Secondary | ICD-10-CM

## 2015-03-16 DIAGNOSIS — F329 Major depressive disorder, single episode, unspecified: Secondary | ICD-10-CM

## 2015-03-16 NOTE — Patient Instructions (Signed)
Preventive Care for Adults A healthy lifestyle and preventive care can promote health and wellness. Preventive health guidelines for women include the following key practices.  A routine yearly physical is a good way to check with your health care provider about your health and preventive screening. It is a chance to share any concerns and updates on your health and to receive a thorough exam.  Visit your dentist for a routine exam and preventive care every 6 months. Brush your teeth twice a day and floss once a day. Good oral hygiene prevents tooth decay and gum disease.  The frequency of eye exams is based on your age, health, family medical history, use of contact lenses, and other factors. Follow your health care provider's recommendations for frequency of eye exams.  Eat a healthy diet. Foods like vegetables, fruits, whole grains, low-fat dairy products, and lean protein foods contain the nutrients you need without too many calories. Decrease your intake of foods high in solid fats, added sugars, and salt. Eat the right amount of calories for you.Get information about a proper diet from your health care provider, if necessary.  Regular physical exercise is one of the most important things you can do for your health. Most adults should get at least 150 minutes of moderate-intensity exercise (any activity that increases your heart rate and causes you to sweat) each week. In addition, most adults need muscle-strengthening exercises on 2 or more days a week.  Maintain a healthy weight. The body mass index (BMI) is a screening tool to identify possible weight problems. It provides an estimate of body fat based on height and weight. Your health care provider can find your BMI and can help you achieve or maintain a healthy weight.For adults 20 years and older:  A BMI below 18.5 is considered underweight.  A BMI of 18.5 to 24.9 is normal.  A BMI of 25 to 29.9 is considered overweight.  A BMI of  30 and above is considered obese.  Maintain normal blood lipids and cholesterol levels by exercising and minimizing your intake of saturated fat. Eat a balanced diet with plenty of fruit and vegetables. Blood tests for lipids and cholesterol should begin at age 76 and be repeated every 5 years. If your lipid or cholesterol levels are high, you are over 50, or you are at high risk for heart disease, you may need your cholesterol levels checked more frequently.Ongoing high lipid and cholesterol levels should be treated with medicines if diet and exercise are not working.  If you smoke, find out from your health care provider how to quit. If you do not use tobacco, do not start.  Lung cancer screening is recommended for adults aged 22-80 years who are at high risk for developing lung cancer because of a history of smoking. A yearly low-dose CT scan of the lungs is recommended for people who have at least a 30-pack-year history of smoking and are a current smoker or have quit within the past 15 years. A pack year of smoking is smoking an average of 1 pack of cigarettes a day for 1 year (for example: 1 pack a day for 30 years or 2 packs a day for 15 years). Yearly screening should continue until the smoker has stopped smoking for at least 15 years. Yearly screening should be stopped for people who develop a health problem that would prevent them from having lung cancer treatment.  If you are pregnant, do not drink alcohol. If you are breastfeeding,  be very cautious about drinking alcohol. If you are not pregnant and choose to drink alcohol, do not have more than 1 drink per day. One drink is considered to be 12 ounces (355 mL) of beer, 5 ounces (148 mL) of wine, or 1.5 ounces (44 mL) of liquor.  Avoid use of street drugs. Do not share needles with anyone. Ask for help if you need support or instructions about stopping the use of drugs.  High blood pressure causes heart disease and increases the risk of  stroke. Your blood pressure should be checked at least every 1 to 2 years. Ongoing high blood pressure should be treated with medicines if weight loss and exercise do not work.  If you are 3-86 years old, ask your health care provider if you should take aspirin to prevent strokes.  Diabetes screening involves taking a blood sample to check your fasting blood sugar level. This should be done once every 3 years, after age 67, if you are within normal weight and without risk factors for diabetes. Testing should be considered at a younger age or be carried out more frequently if you are overweight and have at least 1 risk factor for diabetes.  Breast cancer screening is essential preventive care for women. You should practice "breast self-awareness." This means understanding the normal appearance and feel of your breasts and may include breast self-examination. Any changes detected, no matter how small, should be reported to a health care provider. Women in their 8s and 30s should have a clinical breast exam (CBE) by a health care provider as part of a regular health exam every 1 to 3 years. After age 70, women should have a CBE every year. Starting at age 25, women should consider having a mammogram (breast X-ray test) every year. Women who have a family history of breast cancer should talk to their health care provider about genetic screening. Women at a high risk of breast cancer should talk to their health care providers about having an MRI and a mammogram every year.  Breast cancer gene (BRCA)-related cancer risk assessment is recommended for women who have family members with BRCA-related cancers. BRCA-related cancers include breast, ovarian, tubal, and peritoneal cancers. Having family members with these cancers may be associated with an increased risk for harmful changes (mutations) in the breast cancer genes BRCA1 and BRCA2. Results of the assessment will determine the need for genetic counseling and  BRCA1 and BRCA2 testing.  Routine pelvic exams to screen for cancer are no longer recommended for nonpregnant women who are considered low risk for cancer of the pelvic organs (ovaries, uterus, and vagina) and who do not have symptoms. Ask your health care provider if a screening pelvic exam is right for you.  If you have had past treatment for cervical cancer or a condition that could lead to cancer, you need Pap tests and screening for cancer for at least 20 years after your treatment. If Pap tests have been discontinued, your risk factors (such as having a new sexual partner) need to be reassessed to determine if screening should be resumed. Some women have medical problems that increase the chance of getting cervical cancer. In these cases, your health care provider may recommend more frequent screening and Pap tests.  The HPV test is an additional test that may be used for cervical cancer screening. The HPV test looks for the virus that can cause the cell changes on the cervix. The cells collected during the Pap test can be  tested for HPV. The HPV test could be used to screen women aged 30 years and older, and should be used in women of any age who have unclear Pap test results. After the age of 30, women should have HPV testing at the same frequency as a Pap test.  Colorectal cancer can be detected and often prevented. Most routine colorectal cancer screening begins at the age of 50 years and continues through age 75 years. However, your health care provider may recommend screening at an earlier age if you have risk factors for colon cancer. On a yearly basis, your health care provider may provide home test kits to check for hidden blood in the stool. Use of a small camera at the end of a tube, to directly examine the colon (sigmoidoscopy or colonoscopy), can detect the earliest forms of colorectal cancer. Talk to your health care provider about this at age 50, when routine screening begins. Direct  exam of the colon should be repeated every 5-10 years through age 75 years, unless early forms of pre-cancerous polyps or small growths are found.  People who are at an increased risk for hepatitis B should be screened for this virus. You are considered at high risk for hepatitis B if:  You were born in a country where hepatitis B occurs often. Talk with your health care provider about which countries are considered high risk.  Your parents were born in a high-risk country and you have not received a shot to protect against hepatitis B (hepatitis B vaccine).  You have HIV or AIDS.  You use needles to inject street drugs.  You live with, or have sex with, someone who has hepatitis B.  You get hemodialysis treatment.  You take certain medicines for conditions like cancer, organ transplantation, and autoimmune conditions.  Hepatitis C blood testing is recommended for all people born from 1945 through 1965 and any individual with known risks for hepatitis C.  Practice safe sex. Use condoms and avoid high-risk sexual practices to reduce the spread of sexually transmitted infections (STIs). STIs include gonorrhea, chlamydia, syphilis, trichomonas, herpes, HPV, and human immunodeficiency virus (HIV). Herpes, HIV, and HPV are viral illnesses that have no cure. They can result in disability, cancer, and death.  You should be screened for sexually transmitted illnesses (STIs) including gonorrhea and chlamydia if:  You are sexually active and are younger than 24 years.  You are older than 24 years and your health care provider tells you that you are at risk for this type of infection.  Your sexual activity has changed since you were last screened and you are at an increased risk for chlamydia or gonorrhea. Ask your health care provider if you are at risk.  If you are at risk of being infected with HIV, it is recommended that you take a prescription medicine daily to prevent HIV infection. This is  called preexposure prophylaxis (PrEP). You are considered at risk if:  You are a heterosexual woman, are sexually active, and are at increased risk for HIV infection.  You take drugs by injection.  You are sexually active with a partner who has HIV.  Talk with your health care provider about whether you are at high risk of being infected with HIV. If you choose to begin PrEP, you should first be tested for HIV. You should then be tested every 3 months for as long as you are taking PrEP.  Osteoporosis is a disease in which the bones lose minerals and strength   with aging. This can result in serious bone fractures or breaks. The risk of osteoporosis can be identified using a bone density scan. Women ages 65 years and over and women at risk for fractures or osteoporosis should discuss screening with their health care providers. Ask your health care provider whether you should take a calcium supplement or vitamin D to reduce the rate of osteoporosis.  Menopause can be associated with physical symptoms and risks. Hormone replacement therapy is available to decrease symptoms and risks. You should talk to your health care provider about whether hormone replacement therapy is right for you.  Use sunscreen. Apply sunscreen liberally and repeatedly throughout the day. You should seek shade when your shadow is shorter than you. Protect yourself by wearing long sleeves, pants, a wide-brimmed hat, and sunglasses year round, whenever you are outdoors.  Once a month, do a whole body skin exam, using a mirror to look at the skin on your back. Tell your health care provider of new moles, moles that have irregular borders, moles that are larger than a pencil eraser, or moles that have changed in shape or color.  Stay current with required vaccines (immunizations).  Influenza vaccine. All adults should be immunized every year.  Tetanus, diphtheria, and acellular pertussis (Td, Tdap) vaccine. Pregnant women should  receive 1 dose of Tdap vaccine during each pregnancy. The dose should be obtained regardless of the length of time since the last dose. Immunization is preferred during the 27th-36th week of gestation. An adult who has not previously received Tdap or who does not know her vaccine status should receive 1 dose of Tdap. This initial dose should be followed by tetanus and diphtheria toxoids (Td) booster doses every 10 years. Adults with an unknown or incomplete history of completing a 3-dose immunization series with Td-containing vaccines should begin or complete a primary immunization series including a Tdap dose. Adults should receive a Td booster every 10 years.  Varicella vaccine. An adult without evidence of immunity to varicella should receive 2 doses or a second dose if she has previously received 1 dose. Pregnant females who do not have evidence of immunity should receive the first dose after pregnancy. This first dose should be obtained before leaving the health care facility. The second dose should be obtained 4-8 weeks after the first dose.  Human papillomavirus (HPV) vaccine. Females aged 13-26 years who have not received the vaccine previously should obtain the 3-dose series. The vaccine is not recommended for use in pregnant females. However, pregnancy testing is not needed before receiving a dose. If a female is found to be pregnant after receiving a dose, no treatment is needed. In that case, the remaining doses should be delayed until after the pregnancy. Immunization is recommended for any person with an immunocompromised condition through the age of 26 years if she did not get any or all doses earlier. During the 3-dose series, the second dose should be obtained 4-8 weeks after the first dose. The third dose should be obtained 24 weeks after the first dose and 16 weeks after the second dose.  Zoster vaccine. One dose is recommended for adults aged 60 years or older unless certain conditions are  present.  Measles, mumps, and rubella (MMR) vaccine. Adults born before 1957 generally are considered immune to measles and mumps. Adults born in 1957 or later should have 1 or more doses of MMR vaccine unless there is a contraindication to the vaccine or there is laboratory evidence of immunity to   each of the three diseases. A routine second dose of MMR vaccine should be obtained at least 28 days after the first dose for students attending postsecondary schools, health care workers, or international travelers. People who received inactivated measles vaccine or an unknown type of measles vaccine during 1963-1967 should receive 2 doses of MMR vaccine. People who received inactivated mumps vaccine or an unknown type of mumps vaccine before 1979 and are at high risk for mumps infection should consider immunization with 2 doses of MMR vaccine. For females of childbearing age, rubella immunity should be determined. If there is no evidence of immunity, females who are not pregnant should be vaccinated. If there is no evidence of immunity, females who are pregnant should delay immunization until after pregnancy. Unvaccinated health care workers born before 1957 who lack laboratory evidence of measles, mumps, or rubella immunity or laboratory confirmation of disease should consider measles and mumps immunization with 2 doses of MMR vaccine or rubella immunization with 1 dose of MMR vaccine.  Pneumococcal 13-valent conjugate (PCV13) vaccine. When indicated, a person who is uncertain of her immunization history and has no record of immunization should receive the PCV13 vaccine. An adult aged 19 years or older who has certain medical conditions and has not been previously immunized should receive 1 dose of PCV13 vaccine. This PCV13 should be followed with a dose of pneumococcal polysaccharide (PPSV23) vaccine. The PPSV23 vaccine dose should be obtained at least 8 weeks after the dose of PCV13 vaccine. An adult aged 19  years or older who has certain medical conditions and previously received 1 or more doses of PPSV23 vaccine should receive 1 dose of PCV13. The PCV13 vaccine dose should be obtained 1 or more years after the last PPSV23 vaccine dose.  Pneumococcal polysaccharide (PPSV23) vaccine. When PCV13 is also indicated, PCV13 should be obtained first. All adults aged 65 years and older should be immunized. An adult younger than age 65 years who has certain medical conditions should be immunized. Any person who resides in a nursing home or long-term care facility should be immunized. An adult smoker should be immunized. People with an immunocompromised condition and certain other conditions should receive both PCV13 and PPSV23 vaccines. People with human immunodeficiency virus (HIV) infection should be immunized as soon as possible after diagnosis. Immunization during chemotherapy or radiation therapy should be avoided. Routine use of PPSV23 vaccine is not recommended for American Indians, [place] Natives, or people younger than 65 years unless there are medical conditions that require PPSV23 vaccine. When indicated, people who have unknown immunization and have no record of immunization should receive PPSV23 vaccine. One-time revaccination 5 years after the first dose of PPSV23 is recommended for people aged 19-64 years who have chronic kidney failure, nephrotic syndrome, asplenia, or immunocompromised conditions. People who received 1-2 doses of PPSV23 before age 65 years should receive another dose of PPSV23 vaccine at age 65 years or later if at least 5 years have passed since the previous dose. Doses of PPSV23 are not needed for people immunized with PPSV23 at or after age 65 years.  Meningococcal vaccine. Adults with asplenia or persistent complement component deficiencies should receive 2 doses of quadrivalent meningococcal conjugate (MenACWY-D) vaccine. The doses should be obtained at least 2 months apart.  Microbiologists working with certain meningococcal bacteria, military recruits, people at risk during an outbreak, and people who travel to or live in countries with a high rate of meningitis should be immunized. A first-year college student up through age   21 years who is living in a residence hall should receive a dose if she did not receive a dose on or after her 16th birthday. Adults who have certain high-risk conditions should receive one or more doses of vaccine.  Hepatitis A vaccine. Adults who wish to be protected from this disease, have certain high-risk conditions, work with hepatitis A-infected animals, work in hepatitis A research labs, or travel to or work in countries with a high rate of hepatitis A should be immunized. Adults who were previously unvaccinated and who anticipate close contact with an international adoptee during the first 60 days after arrival in the [place] from a country with a high rate of hepatitis A should be immunized.  Hepatitis B vaccine. Adults who wish to be protected from this disease, have certain high-risk conditions, may be exposed to blood or other infectious body fluids, are household contacts or sex partners of hepatitis B positive people, are clients or workers in certain care facilities, or travel to or work in countries with a high rate of hepatitis B should be immunized.  Haemophilus influenzae type b (Hib) vaccine. A previously unvaccinated person with asplenia or sickle cell disease or having a scheduled splenectomy should receive 1 dose of Hib vaccine. Regardless of previous immunization, a recipient of a hematopoietic stem cell transplant should receive a 3-dose series 6-12 months after her successful transplant. Hib vaccine is not recommended for adults with HIV infection. Preventive Services / Frequency Ages 64 to 68 years  Blood pressure check.** / Every 1 to 2 years.  Lipid and cholesterol check.** / Every 5 years beginning at age  22.  Clinical breast exam.** / Every 3 years for women in their 88s and 53s.  BRCA-related cancer risk assessment.** / For women who have family members with a BRCA-related cancer (breast, ovarian, tubal, or peritoneal cancers).  Pap test.** / Every 2 years from ages 90 through 51. Every 3 years starting at age 21 through age 56 or 3 with a history of 3 consecutive normal Pap tests.  HPV screening.** / Every 3 years from ages 24 through ages 1 to 46 with a history of 3 consecutive normal Pap tests.  Hepatitis C blood test.** / For any individual with known risks for hepatitis C.  Skin self-exam. / Monthly.  Influenza vaccine. / Every year.  Tetanus, diphtheria, and acellular pertussis (Tdap, Td) vaccine.** / Consult your health care provider. Pregnant women should receive 1 dose of Tdap vaccine during each pregnancy. 1 dose of Td every 10 years.  Varicella vaccine.** / Consult your health care provider. Pregnant females who do not have evidence of immunity should receive the first dose after pregnancy.  HPV vaccine. / 3 doses over 6 months, if 72 and younger. The vaccine is not recommended for use in pregnant females. However, pregnancy testing is not needed before receiving a dose.  Measles, mumps, rubella (MMR) vaccine.** / You need at least 1 dose of MMR if you were born in 1957 or later. You may also need a 2nd dose. For females of childbearing age, rubella immunity should be determined. If there is no evidence of immunity, females who are not pregnant should be vaccinated. If there is no evidence of immunity, females who are pregnant should delay immunization until after pregnancy.  Pneumococcal 13-valent conjugate (PCV13) vaccine.** / Consult your health care provider.  Pneumococcal polysaccharide (PPSV23) vaccine.** / 1 to 2 doses if you smoke cigarettes or if you have certain conditions.  Meningococcal vaccine.** /  1 dose if you are age 19 to 21 years and a first-year college  student living in a residence hall, or have one of several medical conditions, you need to get vaccinated against meningococcal disease. You may also need additional booster doses.  Hepatitis A vaccine.** / Consult your health care provider.  Hepatitis B vaccine.** / Consult your health care provider.  Haemophilus influenzae type b (Hib) vaccine.** / Consult your health care provider. Ages 40 to 64 years  Blood pressure check.** / Every 1 to 2 years.  Lipid and cholesterol check.** / Every 5 years beginning at age 20 years.  Lung cancer screening. / Every year if you are aged 55-80 years and have a 30-pack-year history of smoking and currently smoke or have quit within the past 15 years. Yearly screening is stopped once you have quit smoking for at least 15 years or develop a health problem that would prevent you from having lung cancer treatment.  Clinical breast exam.** / Every year after age 40 years.  BRCA-related cancer risk assessment.** / For women who have family members with a BRCA-related cancer (breast, ovarian, tubal, or peritoneal cancers).  Mammogram.** / Every year beginning at age 40 years and continuing for as long as you are in good health. Consult with your health care provider.  Pap test.** / Every 3 years starting at age 30 years through age 65 or 70 years with a history of 3 consecutive normal Pap tests.  HPV screening.** / Every 3 years from ages 30 years through ages 65 to 70 years with a history of 3 consecutive normal Pap tests.  Fecal occult blood test (FOBT) of stool. / Every year beginning at age 50 years and continuing until age 75 years. You may not need to do this test if you get a colonoscopy every 10 years.  Flexible sigmoidoscopy or colonoscopy.** / Every 5 years for a flexible sigmoidoscopy or every 10 years for a colonoscopy beginning at age 50 years and continuing until age 75 years.  Hepatitis C blood test.** / For all people born from 1945 through  1965 and any individual with known risks for hepatitis C.  Skin self-exam. / Monthly.  Influenza vaccine. / Every year.  Tetanus, diphtheria, and acellular pertussis (Tdap/Td) vaccine.** / Consult your health care provider. Pregnant women should receive 1 dose of Tdap vaccine during each pregnancy. 1 dose of Td every 10 years.  Varicella vaccine.** / Consult your health care provider. Pregnant females who do not have evidence of immunity should receive the first dose after pregnancy.  Zoster vaccine.** / 1 dose for adults aged 60 years or older.  Measles, mumps, rubella (MMR) vaccine.** / You need at least 1 dose of MMR if you were born in 1957 or later. You may also need a 2nd dose. For females of childbearing age, rubella immunity should be determined. If there is no evidence of immunity, females who are not pregnant should be vaccinated. If there is no evidence of immunity, females who are pregnant should delay immunization until after pregnancy.  Pneumococcal 13-valent conjugate (PCV13) vaccine.** / Consult your health care provider.  Pneumococcal polysaccharide (PPSV23) vaccine.** / 1 to 2 doses if you smoke cigarettes or if you have certain conditions.  Meningococcal vaccine.** / Consult your health care provider.  Hepatitis A vaccine.** / Consult your health care provider.  Hepatitis B vaccine.** / Consult your health care provider.  Haemophilus influenzae type b (Hib) vaccine.** / Consult your health care provider. Ages 65   years and over  Blood pressure check.** / Every 1 to 2 years.  Lipid and cholesterol check.** / Every 5 years beginning at age 22 years.  Lung cancer screening. / Every year if you are aged 73-80 years and have a 30-pack-year history of smoking and currently smoke or have quit within the past 15 years. Yearly screening is stopped once you have quit smoking for at least 15 years or develop a health problem that would prevent you from having lung cancer  treatment.  Clinical breast exam.** / Every year after age 4 years.  BRCA-related cancer risk assessment.** / For women who have family members with a BRCA-related cancer (breast, ovarian, tubal, or peritoneal cancers).  Mammogram.** / Every year beginning at age 40 years and continuing for as long as you are in good health. Consult with your health care provider.  Pap test.** / Every 3 years starting at age 9 years through age 34 or 91 years with 3 consecutive normal Pap tests. Testing can be stopped between 65 and 70 years with 3 consecutive normal Pap tests and no abnormal Pap or HPV tests in the past 10 years.  HPV screening.** / Every 3 years from ages 57 years through ages 64 or 45 years with a history of 3 consecutive normal Pap tests. Testing can be stopped between 65 and 70 years with 3 consecutive normal Pap tests and no abnormal Pap or HPV tests in the past 10 years.  Fecal occult blood test (FOBT) of stool. / Every year beginning at age 15 years and continuing until age 17 years. You may not need to do this test if you get a colonoscopy every 10 years.  Flexible sigmoidoscopy or colonoscopy.** / Every 5 years for a flexible sigmoidoscopy or every 10 years for a colonoscopy beginning at age 86 years and continuing until age 71 years.  Hepatitis C blood test.** / For all people born from [birth year] through 1967 and any individual with known risks for hepatitis C.  Osteoporosis screening.** / A one-time screening for women ages 83 years and over and women at risk for fractures or osteoporosis.  Skin self-exam. / Monthly.  Influenza vaccine. / Every year.  Tetanus, diphtheria, and acellular pertussis (Tdap/Td) vaccine.** / 1 dose of Td every 10 years.  Varicella vaccine.** / Consult your health care provider.  Zoster vaccine.** / 1 dose for adults aged 61 years or older.  Pneumococcal 13-valent conjugate (PCV13) vaccine.** / Consult your health care provider.  Pneumococcal  polysaccharide (PPSV23) vaccine.** / 1 dose for all adults aged 28 years and older.  Meningococcal vaccine.** / Consult your health care provider.  Hepatitis A vaccine.** / Consult your health care provider.  Hepatitis B vaccine.** / Consult your health care provider.  Haemophilus influenzae type b (Hib) vaccine.** / Consult your health care provider. ** Family history and personal history of risk and conditions may change your health care provider's recommendations. Document Released: 02/11/2002 Document Revised: 05/02/2014 Document Reviewed: 05/13/2011 [facility] Patient Information 2015 [place], [place]. This information is not intended to replace advice given to you by your health care provider. Make sure you discuss any questions you have with your health care provider.

## 2015-03-16 NOTE — Progress Notes (Signed)
Pre visit review using our clinic review tool, if applicable. No additional management support is needed unless otherwise documented below in the visit note. 

## 2015-03-27 DIAGNOSIS — Z0000 Encounter for general adult medical examination without abnormal findings: Secondary | ICD-10-CM | POA: Insufficient documentation

## 2015-03-27 NOTE — Assessment & Plan Note (Signed)
On Levothyroxine, continue to monitor 

## 2015-03-27 NOTE — Assessment & Plan Note (Addendum)
Patient denies any difficulties at home. No trouble with ADLs, depression or falls. No recent changes to vision or hearing. Is UTD with immunizations. Is UTD with screening. Discussed Advanced Directives, patient agrees to bring [place] copies of documents if can. Encouraged heart healthy diet, exercise as tolerated and adequate sleep. See AVS for preventative health care recommendations. Pap--06/2013-normal per patient [facility]--12/15/14-negative Bone Density--DUE CCS--11/15/10-multiple benign polyps; repeat in 5 years--Dr. Fuller Plan Annual labs reviewed. See problem list for patient risk factors and concerns. See AVS for preventative health care recommendations EKG WNL today

## 2015-03-27 NOTE — Assessment & Plan Note (Signed)
Proceed with bone density and continue calcium and vitamin D supplements.

## 2015-03-27 NOTE — Assessment & Plan Note (Signed)
Continue with Duloxetine and let us know if worsens.

## 2015-03-27 NOTE — Assessment & Plan Note (Signed)
Well controlled. Encouraged heart healthy diet such as the DASH diet and exercise as tolerated.  

## 2015-03-27 NOTE — Assessment & Plan Note (Addendum)
Tolerating statin, encouraged heart healthy diet, avoid trans fats, minimize simple carbs and saturated fats. Increase exercise as tolerated. Crestor QOC

## 2015-03-27 NOTE — Assessment & Plan Note (Signed)
hgba1c acceptable, minimize simple carbs. Increase exercise as tolerated.  

## 2015-03-27 NOTE — Assessment & Plan Note (Signed)
Encouraged DASH diet, decrease po intake and increase exercise as tolerated. Needs 7-8 hours of sleep nightly. Avoid trans fats, eat small, frequent meals every 4-5 hours with lean proteins, complex carbs and healthy fats. Minimize simple carbs, GMO foods. 

## 2015-03-27 NOTE — Progress Notes (Signed)
Morgan [name] [id number] Jul 25, 1951 03/27/2015      Progress Note New Patient  Subjective  Chief Complaint  Chief Complaint  Patient presents with  . Medicare Wellness    HPI  Patient is a 64 year old female in today for routine medical care. She is in today for ongoing care, see problem list for health history. No acute concerns but does continue to struggle with knee pain on left. No worsening. She notes her headaches are better. She is still struggling with hi levels of stress due to her husband's medical concerns and hi degree of pain but she feels she is managing it well. Denies CP/palp/SOB/HA/congestion/fevers/GI or GU c/o. Taking meds as prescribed  Past Medical History  Diagnosis Date  . Thyroid disease   . Arthritis   . Allergy   . History of colonic polyps   . Mitral regurgitation   . Diastolic dysfunction     neg nuclear stress 08/09/12  EPIC  . Elevated cholesterol   . Depression   . Upper respiratory infection 11/08/12    fever  with sinus congestion-   states improved  . OSA (obstructive sleep apnea)     no cpap-  per patient "mild " per study 4 years ago  . Tinnitus   . Preventative health care 10/24/2013  . Anxiety and depression 02/22/2014  . Hyperglycemia 02/22/2014  . Elevated BP 09/18/2014    Past Surgical History  Procedure Laterality Date  . 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 Polypectomy      colon  . Joint replacement  2008    rt knee  . Total knee arthroplasty  11/30/2012    Procedure: TOTAL KNEE ARTHROPLASTY;  Surgeon: Gearlean Alf, MD;  Location: [facility];  Service: Orthopedics;  Laterality: Left;    Family History  Problem Relation Age of Onset  . Heart attack Father   . Hyperlipidemia Father   . Heart disease Father   . Cancer Father     BRAIN TUMOR, malignant  . Diabetes Father     diet controlled  . Arthritis Other   . Breast cancer Maternal Aunt   . Breast cancer Paternal Aunt   . Macular degeneration Mother   .  Diabetes Brother     diet controlled  . Migraines Daughter   . Pulmonary embolism Daughter   . Diabetes Paternal Grandfather     History   Social History  . Marital Status: Married    Spouse Name: N/A  . Number of Children: N/A  . Years of Education: N/A   Occupational History  . Not on file.   Social History Main Topics  . Smoking status: Never Smoker   . Smokeless tobacco: Never Used  . Alcohol Use: Yes     Comment: rare  . Drug Use: No  . 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 No major dietary restrictions   Other Topics Concern  . Not on file   Social History Narrative    Current Outpatient Prescriptions on File Prior to Visit  Medication Sig Dispense Refill  . acyclovir (ZOVIRAX) 400 MG tablet Take 1 tablet (400 mg total) by mouth 3 (three) times daily. (Patient taking differently: Take 400 mg by mouth 3 (three) times daily. Take only as needed) 15 tablet 1  . DULoxetine (CYMBALTA) 30 MG capsule TAKE 1 CAPSULE EVERY       EVENING 90 capsule  1  . fluticasone (FLONASE) 50 MCG/ACT nasal spray Place 2 sprays into both nostrils daily. (Patient taking differently: Place 2 sprays into both nostrils daily. Only as needed) 16 g 6  . levothyroxine (SYNTHROID, LEVOTHROID) 75 MCG tablet Take 1 tablet (75 mcg total) by mouth daily. 30 tablet 1  . oxyCODONE-acetaminophen (ROXICET) 5-325 MG per tablet Take 1 tablet by mouth every 8 (eight) hours as needed for severe pain. 30 tablet 0  . rosuvastatin (CRESTOR) 5 MG tablet Take 1 tablet (5 mg total) by mouth daily. Failed Atorvastatin and Simvastatin 90 tablet 1   No current facility-administered medications on file prior to visit.    Allergies  Allergen Reactions  . Atorvastatin     myalgias  . Simvastatin     myalgia  . Sulfonamide Derivatives     REACTION: Rash    Review of Systems  Review of Systems  Constitutional: Negative for fever, chills and malaise/fatigue.   HENT: Negative for congestion, hearing loss and nosebleeds.   Eyes: Negative for discharge.  Respiratory: Negative for cough, sputum production, shortness of breath and wheezing.   Cardiovascular: Negative for chest pain, palpitations and leg swelling.  Gastrointestinal: Negative for heartburn, nausea, vomiting, abdominal pain, diarrhea, constipation and blood in stool.  Genitourinary: Negative for dysuria, urgency, frequency and hematuria.  Musculoskeletal: Negative for myalgias, back pain and falls.  Skin: Negative for rash.  Neurological: Negative for dizziness, tremors, sensory change, focal weakness, loss of consciousness, weakness and headaches.  Endo/Heme/Allergies: Negative for polydipsia. Does not bruise/bleed easily.  Psychiatric/Behavioral: Negative for suicidal ideas. The patient is nervous/anxious. The patient does not have insomnia.        Secondary to her husbands chronic pain concerns.    Objective  BP 116/45 mmHg  Pulse 80  Temp(Src) 98.4 F (36.9 C) (Oral)  Ht 5\' 3"  (1.6 m)  Wt 151 lb 9.6 oz (68.765 kg)  BMI 26.86 kg/m2  SpO2 99%  Physical Exam  Physical Exam  Constitutional: She is oriented to person, place, and time and well-developed, well-nourished, and in no distress. No distress.  HENT:  Head: Normocephalic and atraumatic.  Right Ear: External ear normal.  Left Ear: External ear normal.  Nose: Nose normal.  Mouth/Throat: Oropharynx is clear and moist. No oropharyngeal exudate.  Eyes: Conjunctivae are normal. Pupils are equal, round, and reactive to light. Right eye exhibits no discharge. Left eye exhibits no discharge. No scleral icterus.  Neck: Normal range of motion. Neck supple. No thyromegaly present.  Cardiovascular: Normal rate, regular rhythm, normal heart sounds and intact distal pulses.   No murmur heard. Pulmonary/Chest: Effort normal and breath sounds normal. No respiratory distress. She has no wheezes. She has no rales.  Abdominal: Soft.  Bowel sounds are normal. She exhibits no distension and no mass. There is no tenderness.  Musculoskeletal: Normal range of motion. She exhibits no edema or tenderness.  Lymphadenopathy:    She has no cervical adenopathy.  Neurological: She is alert and oriented to person, place, and time. She has normal reflexes. No cranial nerve deficit. Coordination normal.  Skin: Skin is warm and dry. No rash noted. She is not diaphoretic.  Psychiatric: Mood, memory and affect normal.       Assessment & Plan  Medicare annual wellness visit, subsequent Patient denies any difficulties at home. No trouble with ADLs, depression or falls. No recent changes to vision or hearing. Is UTD with immunizations. Is UTD with screening. Discussed Advanced Directives, patient agrees to bring [place] copies  of documents if can. Encouraged heart healthy diet, exercise as tolerated and adequate sleep. See AVS for preventative health care recommendations. Pap--06/2013-normal per patient [facility]--12/15/14-negative Bone Density--DUE CCS--11/15/10-multiple benign polyps; repeat in 5 years--Dr. Fuller Plan Annual labs reviewed. See problem list for patient risk factors and concerns. See AVS for preventative health care recommendations EKG WNL today   Overweight Encouraged DASH diet, decrease po intake and increase exercise as tolerated. Needs 7-8 hours of sleep nightly. Avoid trans fats, eat small, frequent meals every 4-5 hours with lean proteins, complex carbs and healthy fats. Minimize simple carbs, GMO foods.   Hyperlipidemia Tolerating statin, encouraged heart healthy diet, avoid trans fats, minimize simple carbs and saturated fats. Increase exercise as tolerated. Crestor QOC   Elevated BP Well controlled. Encouraged heart healthy diet such as the DASH diet and exercise as tolerated.    Hyperglycemia hgba1c acceptable, minimize simple carbs. Increase exercise as tolerated.    Hypothyroidism On Levothyroxine, continue to  monitor   Disorder of bone and cartilage Proceed with bone density and continue calcium and vitamin D supplements.   Anxiety and depression Continue with Duloxetine and let us know if worsens.

## 2015-04-03 ENCOUNTER — Inpatient Hospital Stay: Admission: RE | Admit: 2015-04-03 | Payer: Medicare HMO | Source: Ambulatory Visit

## 2015-05-06 ENCOUNTER — Other Ambulatory Visit: Payer: Self-pay | Admitting: Family Medicine

## 2015-06-08 ENCOUNTER — Other Ambulatory Visit: Payer: Medicare HMO

## 2015-06-09 ENCOUNTER — Other Ambulatory Visit: Payer: Medicare HMO

## 2015-06-16 ENCOUNTER — Other Ambulatory Visit (INDEPENDENT_AMBULATORY_CARE_PROVIDER_SITE_OTHER): Payer: Medicare HMO

## 2015-06-16 DIAGNOSIS — E039 Hypothyroidism, unspecified: Secondary | ICD-10-CM | POA: Diagnosis not present

## 2015-06-16 LAB — TSH: TSH: 0.36 u[IU]/mL (ref 0.35–4.50)

## 2015-06-19 ENCOUNTER — Encounter: Payer: Self-pay | Admitting: Family Medicine

## 2015-06-23 ENCOUNTER — Other Ambulatory Visit: Payer: Self-pay | Admitting: Family Medicine

## 2015-06-26 ENCOUNTER — Other Ambulatory Visit: Payer: Self-pay | Admitting: Family Medicine

## 2015-08-08 ENCOUNTER — Ambulatory Visit: Payer: Medicare HMO | Admitting: Family Medicine

## 2015-08-14 ENCOUNTER — Other Ambulatory Visit: Payer: Self-pay | Admitting: Family Medicine

## 2015-08-14 DIAGNOSIS — B001 Herpesviral vesicular dermatitis: Secondary | ICD-10-CM

## 2015-08-14 MED ORDER — ACYCLOVIR 400 MG PO TABS: 400.0000 mg | ORAL_TABLET | Freq: Three times a day (TID) | ORAL | Status: DC

## 2015-10-03 ENCOUNTER — Ambulatory Visit (INDEPENDENT_AMBULATORY_CARE_PROVIDER_SITE_OTHER): Payer: Medicare HMO

## 2015-10-03 DIAGNOSIS — Z23 Encounter for immunization: Secondary | ICD-10-CM | POA: Diagnosis not present

## 2015-11-13 ENCOUNTER — Encounter: Payer: Self-pay | Admitting: Gastroenterology

## 2015-12-05 ENCOUNTER — Other Ambulatory Visit: Payer: Self-pay | Admitting: Family Medicine

## 2015-12-13 DIAGNOSIS — X32XXXD Exposure to sunlight, subsequent encounter: Secondary | ICD-10-CM | POA: Diagnosis not present

## 2015-12-13 DIAGNOSIS — L820 Inflamed seborrheic keratosis: Secondary | ICD-10-CM | POA: Diagnosis not present

## 2015-12-13 DIAGNOSIS — L570 Actinic keratosis: Secondary | ICD-10-CM | POA: Diagnosis not present

## 2015-12-28 ENCOUNTER — Telehealth: Payer: Self-pay | Admitting: Family Medicine

## 2015-12-28 NOTE — Telephone Encounter (Signed)
30 day supply sent to pharmacy needs 6 month follow up apt.

## 2016-01-04 NOTE — Telephone Encounter (Signed)
Called pt, spoke with spouse. He will have her call back to schedule an appt.

## 2016-01-09 DIAGNOSIS — R69 Illness, unspecified: Secondary | ICD-10-CM | POA: Diagnosis not present

## 2016-01-11 ENCOUNTER — Encounter: Payer: Self-pay | Admitting: Internal Medicine

## 2016-01-11 ENCOUNTER — Ambulatory Visit (INDEPENDENT_AMBULATORY_CARE_PROVIDER_SITE_OTHER): Payer: Medicare HMO | Admitting: Internal Medicine

## 2016-01-11 VITALS — BP 118/72 | HR 69 | Temp 97.9°F | Ht 63.0 in | Wt 150.4 lb

## 2016-01-11 DIAGNOSIS — R42 Dizziness and giddiness: Secondary | ICD-10-CM

## 2016-01-11 DIAGNOSIS — J0100 Acute maxillary sinusitis, unspecified: Secondary | ICD-10-CM | POA: Diagnosis not present

## 2016-01-11 MED ORDER — AZELASTINE HCL 0.1 % NA SOLN: 2.0000 | Freq: Every evening | NASAL | Status: DC | PRN

## 2016-01-11 MED ORDER — AMOXICILLIN 500 MG PO CAPS: 1000.0000 mg | ORAL_CAPSULE | Freq: Two times a day (BID) | ORAL | Status: DC

## 2016-01-11 NOTE — Patient Instructions (Signed)
Rest, fluids , tylenol  If cough:  Take Mucinex DM twice a day as needed until better  For nasal congestion: Use OTC Nasocort or Flonase : 2 nasal sprays on each side of the nose in the morning until you feel better Use ASTELIN a prescribed spray : 2 nasal sprays on each side of the nose at night until you feel better   Avoid decongestants such as  Pseudoephedrine or phenylephrine     Take the antibiotic as prescribed  (Amoxicillin)  Call if not gradually better over the next  10 days  Call anytime if the symptoms are severe  Continue antivert

## 2016-01-11 NOTE — Progress Notes (Signed)
Pre visit review using our clinic review tool, if applicable. No additional management support is needed unless otherwise documented below in the visit note. 

## 2016-01-11 NOTE — Progress Notes (Signed)
Subjective:    Patient ID: Morgan [name], female    DOB: December 18, 1951, 65 y.o.   MRN: [id number]  DOS:  01/11/2016 Type of visit - description : Acute Interval history: Symptoms started 2 weeks ago with nasal congestion, frontal/sinus pain. Has been taking Sudafed and Allegra without much help. Also 10 days ago developed vertigo, states that has similar symptoms a few years ago and describe current symptoms as a flareup. Reports dizziness  ("spinning")  when she moves her head or bends over, symptoms lasted a few minutes. + Associated mild nausea.   Review of Systems Denies fever chills No vomiting, diarrhea, blood in the stools. No cough. No head injury, no headache per se, diplopia, slurred speech, motor deficits  Past Medical History  Diagnosis Date  . Thyroid disease   . Arthritis   . Allergy   . History of colonic polyps   . Mitral regurgitation   . Diastolic dysfunction     neg nuclear stress 08/09/12  EPIC  . Elevated cholesterol   . Depression   . Upper respiratory infection 11/08/12    fever  with sinus congestion-   states improved  . OSA (obstructive sleep apnea)     no cpap-  per patient "mild " per study 4 years ago  . Tinnitus   . Preventative health care 10/24/2013  . Anxiety and depression 02/22/2014  . Hyperglycemia 02/22/2014  . Elevated BP 09/18/2014    Past Surgical History  Procedure Laterality Date  . 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 Polypectomy      colon  . Joint replacement  2008    rt knee  . Total knee arthroplasty  11/30/2012    Procedure: TOTAL KNEE ARTHROPLASTY;  Surgeon: Gearlean Alf, MD;  Location: [facility];  Service: Orthopedics;  Laterality: Left;    Social History   Social History  . Marital Status: Married    Spouse Name: N/A  . Number of Children: N/A  . Years of Education: N/A   Occupational History  . Not on file.   Social History Main Topics  . Smoking status: Never Smoker   . Smokeless tobacco: Never  Used  . Alcohol Use: Yes     Comment: rare  . Drug Use: No  . 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 No major dietary restrictions   Other Topics Concern  . Not on file   Social History Narrative        Medication List       This list is accurate as of: 01/11/16 11:59 PM.  Always use your most recent med list.               acyclovir 400 MG tablet  Commonly known as:  ZOVIRAX  Take 1 tablet (400 mg total) by mouth 3 (three) times daily. Take only as needed     amoxicillin 500 MG capsule  Commonly known as:  AMOXIL  Take 2 capsules (1,000 mg total) by mouth 2 (two) times daily.     azelastine 0.1 % nasal spray  Commonly known as:  ASTELIN  Place 2 sprays into both nostrils at bedtime as needed for rhinitis. Use in each nostril as directed     CRESTOR 5 MG tablet  Generic drug:  rosuvastatin  TAKE 1 TABLET DAILY     DULoxetine 30 MG capsule  Commonly known as:  CYMBALTA  TAKE 1 CAPSULE EVERY       EVENING     fluticasone 50 MCG/ACT nasal spray  Commonly known as:  FLONASE  Place 2 sprays into both nostrils daily.     SYNTHROID 75 MCG tablet  Generic drug:  levothyroxine  TAKE ONE TABLET BY MOUTH ONE TIME DAILY           Objective:   Physical Exam BP 118/72 mmHg  Pulse 69  Temp(Src) 97.9 F (36.6 C) (Oral)  Ht 5\' 3"  (1.6 m)  Wt 150 lb 6 oz (68.21 kg)  BMI 26.64 kg/m2  SpO2 97% General:   Well developed, well nourished . NAD.  HEENT:  Normocephalic . Face symmetric, atraumatic. Nose: Congested TMs: Normal Throat: Symmetric, no red Sinuses: TTP @  the maxillary and frontal sinuses Neck: Normal carotid pulses Lungs:  CTA B Normal respiratory effort, no intercostal retractions, no accessory muscle use. Heart: RRR,  no murmur.  No pretibial edema bilaterally  Skin: Not pale. Not jaundice Neurologic:  alert & oriented X3.  Speech normal, gait appropriate for age and unassisted. DTRs and motor  symmetric, normal lower extremity coordination. Psych--  Cognition and judgment appear intact.  Cooperative with normal attention span and concentration.  Behavior appropriate. No anxious or depressed appearing.      Assessment & Plan:   Sinusitis: 2 weeks history of upper respiratory symptoms, tender at the sinuses, likely sinusitis. Will prescribe amoxicillin. See instructions. Dizziness: Triggered by sinusitis?.   neurological exam is normal, likely a peripheral issue, rec  Antivert as needed, will call if not improving

## 2016-01-19 ENCOUNTER — Ambulatory Visit (INDEPENDENT_AMBULATORY_CARE_PROVIDER_SITE_OTHER): Payer: Medicare HMO | Admitting: Family Medicine

## 2016-01-19 ENCOUNTER — Encounter: Payer: Self-pay | Admitting: Family Medicine

## 2016-01-19 VITALS — BP 132/72 | HR 72 | Temp 97.7°F | Ht 63.0 in | Wt 150.0 lb

## 2016-01-19 DIAGNOSIS — E039 Hypothyroidism, unspecified: Secondary | ICD-10-CM | POA: Diagnosis not present

## 2016-01-19 DIAGNOSIS — J0191 Acute recurrent sinusitis, unspecified: Secondary | ICD-10-CM

## 2016-01-19 DIAGNOSIS — Z23 Encounter for immunization: Secondary | ICD-10-CM | POA: Diagnosis not present

## 2016-01-19 DIAGNOSIS — R030 Elevated blood-pressure reading, without diagnosis of hypertension: Secondary | ICD-10-CM | POA: Diagnosis not present

## 2016-01-19 DIAGNOSIS — R739 Hyperglycemia, unspecified: Secondary | ICD-10-CM

## 2016-01-19 DIAGNOSIS — Z1159 Encounter for screening for other viral diseases: Secondary | ICD-10-CM

## 2016-01-19 DIAGNOSIS — E785 Hyperlipidemia, unspecified: Secondary | ICD-10-CM

## 2016-01-19 DIAGNOSIS — R7309 Other abnormal glucose: Secondary | ICD-10-CM | POA: Diagnosis not present

## 2016-01-19 DIAGNOSIS — E663 Overweight: Secondary | ICD-10-CM

## 2016-01-19 DIAGNOSIS — IMO0001 Reserved for inherently not codable concepts without codable children: Secondary | ICD-10-CM

## 2016-01-19 LAB — LIPID PANEL
CHOL/HDL RATIO: 2.8 ratio (ref <=5.0)
CHOLESTEROL: 179 mg/dL (ref 125–200)
HDL: 65 mg/dL (ref >=46)
LDL CALC: 96 mg/dL (ref <130)
TRIGLYCERIDES: 88 mg/dL (ref <150)
VLDL: 18 mg/dL (ref <30)

## 2016-01-19 LAB — CBC
HEMATOCRIT: 38.3 % (ref 36.0–46.0)
HEMOGLOBIN: 12.6 g/dL (ref 12.0–15.0)
MCH: 30.3 pg (ref 26.0–34.0)
MCHC: 32.9 g/dL (ref 30.0–36.0)
MCV: 92.1 fL (ref 78.0–100.0)
MPV: 8.8 fL (ref 8.6–12.4)
Platelets: 267 10*3/uL (ref 150–400)
RBC: 4.16 MIL/uL (ref 3.87–5.11)
RDW: 14 % (ref 11.5–15.5)
WBC: 7.7 10*3/uL (ref 4.0–10.5)

## 2016-01-19 LAB — COMPREHENSIVE METABOLIC PANEL
ALBUMIN: 4.3 g/dL (ref 3.6–5.1)
ALT: 23 U/L (ref 6–29)
AST: 26 U/L (ref 10–35)
Alkaline Phosphatase: 82 U/L (ref 33–130)
BUN: 21 mg/dL (ref 7–25)
CALCIUM: 9.2 mg/dL (ref 8.6–10.4)
CHLORIDE: 107 mmol/L (ref 98–110)
CO2: 22 mmol/L (ref 20–31)
CREATININE: 0.6 mg/dL (ref 0.50–0.99)
Glucose, Bld: 87 mg/dL (ref 65–99)
Potassium: 4 mmol/L (ref 3.5–5.3)
SODIUM: 141 mmol/L (ref 135–146)
TOTAL PROTEIN: 7.1 g/dL (ref 6.1–8.1)
Total Bilirubin: 0.3 mg/dL (ref 0.2–1.2)

## 2016-01-19 MED ORDER — SYNTHROID 75 MCG PO TABS: 75.0000 ug | ORAL_TABLET | Freq: Every day | ORAL | Status: DC

## 2016-01-19 MED ORDER — CRESTOR 5 MG PO TABS: 5.0000 mg | ORAL_TABLET | Freq: Every day | ORAL | Status: DC

## 2016-01-19 MED ORDER — DIAZEPAM 5 MG PO TABS: 2.5000 mg | ORAL_TABLET | Freq: Three times a day (TID) | ORAL | Status: DC | PRN

## 2016-01-19 NOTE — Progress Notes (Signed)
Subjective:    Patient ID: Morgan [name], female    DOB: 1951/05/07, 65 y.o.   MRN: [id number]  Chief Complaint  Patient presents with  . Follow-up    HPI Patient is in today for follow-up. She was seen last week and treated for sinusitis. She notes her congestion is improving but not gone. Denies fevers and chills. Does still occasionally struggle with some mild sense of disequilibrium. Also endorses fatigue. No other new or acute complaints. Continues to struggle with a great stress secondary to her husbands chronic pain. Denies CP/palp/SOB/HA/fevers/GI or GU c/o. Taking meds as prescribed  Past Medical History  Diagnosis Date  . Thyroid disease   . Arthritis   . Allergy   . History of colonic polyps   . Mitral regurgitation   . Diastolic dysfunction     neg nuclear stress 08/09/12  EPIC  . Elevated cholesterol   . Depression   . Upper respiratory infection 11/08/12    fever  with sinus congestion-   states improved  . OSA (obstructive sleep apnea)     no cpap-  per patient "mild " per study 4 years ago  . Tinnitus   . Preventative health care 10/24/2013  . Anxiety and depression 02/22/2014  . Hyperglycemia 02/22/2014  . Elevated BP 09/18/2014    Past Surgical History  Procedure Laterality Date  . 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 Polypectomy      colon  . Joint replacement  2008    rt knee  . Total knee arthroplasty  11/30/2012    Procedure: TOTAL KNEE ARTHROPLASTY;  Surgeon: Gearlean Alf, MD;  Location: [facility];  Service: Orthopedics;  Laterality: Left;    Family History  Problem Relation Age of Onset  . Heart attack Father   . Hyperlipidemia Father   . Heart disease Father   . Cancer Father     BRAIN TUMOR, malignant  . Diabetes Father     diet controlled  . Arthritis Other   . Breast cancer Maternal Aunt   . Breast cancer Paternal Aunt   . Macular degeneration Mother   . Diabetes Brother     diet controlled  . Migraines Daughter   .  Pulmonary embolism Daughter   . Diabetes Paternal Grandfather     Social History   Social History  . Marital Status: Married    Spouse Name: N/A  . Number of Children: N/A  . Years of Education: N/A   Occupational History  . Not on file.   Social History Main Topics  . Smoking status: Never Smoker   . Smokeless tobacco: Never Used  . Alcohol Use: Yes     Comment: rare  . Drug Use: No  . 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 No major dietary restrictions   Other Topics Concern  . Not on file   Social History Narrative    Outpatient Prescriptions Prior to Visit  Medication Sig Dispense Refill  . acyclovir (ZOVIRAX) 400 MG tablet Take 1 tablet (400 mg total) by mouth 3 (three) times daily. Take only as needed 15 tablet 1  . amoxicillin (AMOXIL) 500 MG capsule Take 2 capsules (1,000 mg total) by mouth 2 (two) times daily. 40 capsule 0  . azelastine (ASTELIN) 0.1 % nasal spray Place 2 sprays into both nostrils at bedtime as needed for rhinitis. Use in each nostril as directed 30  mL 3  . fluticasone (FLONASE) 50 MCG/ACT nasal spray Place 2 sprays into both nostrils daily. 16 g 6  . CRESTOR 5 MG tablet TAKE 1 TABLET DAILY 90 tablet 2  . DULoxetine (CYMBALTA) 30 MG capsule TAKE 1 CAPSULE EVERY       EVENING 30 capsule 0  . SYNTHROID 75 MCG tablet TAKE ONE TABLET BY MOUTH ONE TIME DAILY 30 tablet 6   No facility-administered medications prior to visit.    Allergies  Allergen Reactions  . Atorvastatin     myalgias  . Simvastatin     myalgia  . Sulfonamide Derivatives     REACTION: Rash    Review of Systems  Constitutional: Positive for malaise/fatigue. Negative for fever.  HENT: Positive for congestion.   Eyes: Negative for discharge.  Respiratory: Negative for shortness of breath.   Cardiovascular: Negative for chest pain, palpitations and leg swelling.  Gastrointestinal: Negative for nausea and abdominal pain.    Genitourinary: Negative for dysuria.  Musculoskeletal: Negative for falls.  Skin: Negative for rash.  Neurological: Positive for dizziness. Negative for loss of consciousness and headaches.  Endo/Heme/Allergies: Negative for environmental allergies.  Psychiatric/Behavioral: Negative for depression. The patient is not nervous/anxious.        Objective:    Physical Exam  Constitutional: She is oriented to person, place, and time. She appears well-developed and well-nourished. No distress.  HENT:  Head: Normocephalic and atraumatic.  Nose: Nose normal.  Eyes: Right eye exhibits no discharge. Left eye exhibits no discharge.  Neck: Normal range of motion. Neck supple.  Cardiovascular: Normal rate and regular rhythm.   No murmur heard. Pulmonary/Chest: Effort normal and breath sounds normal.  Abdominal: Soft. Bowel sounds are normal. There is no tenderness.  Musculoskeletal: She exhibits no edema.  Neurological: She is alert and oriented to person, place, and time.  Skin: Skin is warm and dry.  Psychiatric: She has a normal mood and affect.  Nursing note and vitals reviewed.   BP 132/72 mmHg  Pulse 72  Temp(Src) 97.7 F (36.5 C) (Oral)  Ht 5\' 3"  (1.6 m)  Wt 150 lb (68.04 kg)  BMI 26.58 kg/m2  SpO2 97% Wt Readings from Last 3 Encounters:  01/19/16 150 lb (68.04 kg)  01/11/16 150 lb 6 oz (68.21 kg)  03/16/15 151 lb 9.6 oz (68.765 kg)     Lab Results  Component Value Date   WBC 7.7 01/19/2016   HGB 12.6 01/19/2016   HCT 38.3 01/19/2016   PLT 267 01/19/2016   GLUCOSE 87 01/19/2016   CHOL 179 01/19/2016   TRIG 88 01/19/2016   HDL 65 01/19/2016   LDLDIRECT 152.7 02/01/2013   LDLCALC 96 01/19/2016   ALT 23 01/19/2016   AST 26 01/19/2016   NA 141 01/19/2016   K 4.0 01/19/2016   CL 107 01/19/2016   CREATININE 0.60 01/19/2016   BUN 21 01/19/2016   CO2 22 01/19/2016   TSH 0.329* 01/19/2016   INR 0.94 11/20/2012   HGBA1C 5.9* 01/19/2016    Lab Results  Component  Value Date   TSH 0.329* 01/19/2016   Lab Results  Component Value Date   WBC 7.7 01/19/2016   HGB 12.6 01/19/2016   HCT 38.3 01/19/2016   MCV 92.1 01/19/2016   PLT 267 01/19/2016   Lab Results  Component Value Date   NA 141 01/19/2016   K 4.0 01/19/2016   CO2 22 01/19/2016   GLUCOSE 87 01/19/2016   BUN 21 01/19/2016   CREATININE  0.60 01/19/2016   BILITOT 0.3 01/19/2016   ALKPHOS 82 01/19/2016   AST 26 01/19/2016   ALT 23 01/19/2016   PROT 7.1 01/19/2016   ALBUMIN 4.3 01/19/2016   CALCIUM 9.2 01/19/2016   GFR 95.32 03/09/2015   Lab Results  Component Value Date   CHOL 179 01/19/2016   Lab Results  Component Value Date   HDL 65 01/19/2016   Lab Results  Component Value Date   LDLCALC 96 01/19/2016   Lab Results  Component Value Date   TRIG 88 01/19/2016   Lab Results  Component Value Date   CHOLHDL 2.8 01/19/2016   Lab Results  Component Value Date   HGBA1C 5.9* 01/19/2016       Assessment & Plan:   Problem List Items Addressed This Visit    Acute infection of nasal sinus    Improving with treatment. Still has a slight sense of equilibrium at times, encourged to continue Mucinex, increased hydration and rest.       Elevated BP    Well controlled. Encouraged heart healthy diet such as the DASH diet and exercise as tolerated.       Relevant Orders   CBC (Completed)   Lipid panel (Completed)   Hepatitis C antibody (Completed)   TSH (Completed)   Comprehensive metabolic panel (Completed)   Hemoglobin A1c (Completed)   Hyperglycemia    hgba1c acceptable, minimize simple carbs. Increase exercise as tolerated. Continue current meds      Relevant Orders   CBC (Completed)   Lipid panel (Completed)   Hepatitis C antibody (Completed)   TSH (Completed)   Comprehensive metabolic panel (Completed)   Hemoglobin A1c (Completed)   Hyperlipidemia    Crestor 5 mg encouraged heart healthy diet, avoid trans fats, minimize simple carbs and saturated fats.  Increase exercise as tolerated      Relevant Medications   CRESTOR 5 MG tablet   Other Relevant Orders   CBC (Completed)   Lipid panel (Completed)   Hepatitis C antibody (Completed)   TSH (Completed)   Comprehensive metabolic panel (Completed)   Hemoglobin A1c (Completed)   Hypothyroidism - Primary    On Levothyroxine, continue to monitor      Relevant Medications   SYNTHROID 75 MCG tablet   Other Relevant Orders   CBC (Completed)   Lipid panel (Completed)   Hepatitis C antibody (Completed)   TSH (Completed)   Comprehensive metabolic panel (Completed)   Hemoglobin A1c (Completed)   Overweight    Encouraged DASH diet, decrease po intake and increase exercise as tolerated. Needs 7-8 hours of sleep nightly. Avoid trans fats, eat small, frequent meals every 4-5 hours with lean proteins, complex carbs and healthy fats. Minimize simple carbs.       Other Visit Diagnoses    Need for Tdap vaccination        Need for hepatitis C screening test        Relevant Orders    Hepatitis C antibody (Completed)    Need for TD vaccine        Relevant Orders    Td : Tetanus/diphtheria >7yo Preservative  free (Completed)       I have changed Ms. Roskos's SYNTHROID and CRESTOR. I am also having her start on diazepam. Additionally, I am having her maintain her fluticasone, acyclovir, azelastine, and amoxicillin.  Meds ordered this encounter  Medications  . SYNTHROID 75 MCG tablet    Sig: Take 1 tablet (75 mcg total) by mouth daily.  Dispense:  90 tablet    Refill:  1  . CRESTOR 5 MG tablet    Sig: Take 1 tablet (5 mg total) by mouth daily.    Dispense:  90 tablet    Refill:  2  . diazepam (VALIUM) 5 MG tablet    Sig: Take 0.5-1 tablets (2.5-5 mg total) by mouth every 8 (eight) hours as needed for anxiety.    Dispense:  30 tablet    Refill:  1     Willette Alma, MD

## 2016-01-19 NOTE — Patient Instructions (Signed)

## 2016-01-19 NOTE — Progress Notes (Signed)
Pre visit review using our clinic review tool, if applicable. No additional management support is needed unless otherwise documented below in the visit note. 

## 2016-01-20 LAB — HEMOGLOBIN A1C
Hgb A1c MFr Bld: 5.9 % — ABNORMAL HIGH (ref <5.7)
MEAN PLASMA GLUCOSE: 123 mg/dL — AB (ref <117)

## 2016-01-20 LAB — TSH: TSH: 0.329 u[IU]/mL — ABNORMAL LOW (ref 0.350–4.500)

## 2016-01-20 LAB — HEPATITIS C ANTIBODY: HCV Ab: NEGATIVE

## 2016-01-22 ENCOUNTER — Other Ambulatory Visit: Payer: Self-pay | Admitting: Family Medicine

## 2016-01-22 DIAGNOSIS — E038 Other specified hypothyroidism: Secondary | ICD-10-CM

## 2016-01-22 MED ORDER — DULOXETINE HCL 30 MG PO CPEP: ORAL_CAPSULE | ORAL | Status: DC

## 2016-01-28 NOTE — Assessment & Plan Note (Signed)
Improving with treatment. Still has a slight sense of equilibrium at times, encourged to continue Mucinex, increased hydration and rest.

## 2016-01-28 NOTE — Assessment & Plan Note (Signed)
hgba1c acceptable, minimize simple carbs. Increase exercise as tolerated. Continue current meds 

## 2016-01-28 NOTE — Assessment & Plan Note (Signed)
Encouraged DASH diet, decrease po intake and increase exercise as tolerated. Needs 7-8 hours of sleep nightly. Avoid trans fats, eat small, frequent meals every 4-5 hours with lean proteins, complex carbs and healthy fats. Minimize simple carbs 

## 2016-01-28 NOTE — Assessment & Plan Note (Addendum)
Crestor 5 mg encouraged heart healthy diet, avoid trans fats, minimize simple carbs and saturated fats. Increase exercise as tolerated

## 2016-01-28 NOTE — Assessment & Plan Note (Signed)
Well controlled. Encouraged heart healthy diet such as the DASH diet and exercise as tolerated.  

## 2016-01-28 NOTE — Assessment & Plan Note (Signed)
On Levothyroxine, continue to monitor 

## 2016-01-30 ENCOUNTER — Ambulatory Visit (HOSPITAL_BASED_OUTPATIENT_CLINIC_OR_DEPARTMENT_OTHER)
Admission: RE | Admit: 2016-01-30 | Discharge: 2016-01-30 | Disposition: A | Discharge status: Home / Self Care | Payer: Medicare HMO | Source: Ambulatory Visit | Attending: Family Medicine | Admitting: Family Medicine

## 2016-01-30 ENCOUNTER — Other Ambulatory Visit: Payer: Self-pay | Admitting: Family Medicine

## 2016-01-30 DIAGNOSIS — M8580 Other specified disorders of bone density and structure, unspecified site: Secondary | ICD-10-CM | POA: Diagnosis not present

## 2016-01-30 DIAGNOSIS — Z780 Asymptomatic menopausal state: Secondary | ICD-10-CM | POA: Diagnosis not present

## 2016-01-30 DIAGNOSIS — Z1231 Encounter for screening mammogram for malignant neoplasm of breast: Secondary | ICD-10-CM | POA: Diagnosis not present

## 2016-01-30 DIAGNOSIS — E039 Hypothyroidism, unspecified: Secondary | ICD-10-CM | POA: Diagnosis not present

## 2016-01-30 DIAGNOSIS — M8589 Other specified disorders of bone density and structure, multiple sites: Secondary | ICD-10-CM | POA: Diagnosis not present

## 2016-07-05 ENCOUNTER — Telehealth: Payer: Self-pay

## 2016-07-05 NOTE — Telephone Encounter (Signed)
Patient is on the list for [employer] 2017 and may be a good candidate for an AWV in 2017. Please let me know if/when appt is scheduled.   

## 2016-07-11 NOTE — Telephone Encounter (Signed)
Patient states she would like to conduct her medicare wellness with PCP and will schedule AWV when she comes in for her medication follow up with PCP on  07/22/16.

## 2016-07-17 ENCOUNTER — Other Ambulatory Visit: Payer: Self-pay | Admitting: Family Medicine

## 2016-07-19 ENCOUNTER — Ambulatory Visit: Payer: Medicare HMO | Admitting: Family Medicine

## 2016-07-22 ENCOUNTER — Ambulatory Visit (INDEPENDENT_AMBULATORY_CARE_PROVIDER_SITE_OTHER): Payer: Medicare HMO | Admitting: Family Medicine

## 2016-07-22 ENCOUNTER — Encounter: Payer: Self-pay | Admitting: Family Medicine

## 2016-07-22 VITALS — BP 130/80 | HR 65 | Temp 98.5°F | Ht 63.0 in | Wt 149.2 lb

## 2016-07-22 DIAGNOSIS — F418 Other specified anxiety disorders: Secondary | ICD-10-CM

## 2016-07-22 DIAGNOSIS — IMO0001 Reserved for inherently not codable concepts without codable children: Secondary | ICD-10-CM

## 2016-07-22 DIAGNOSIS — F329 Major depressive disorder, single episode, unspecified: Secondary | ICD-10-CM

## 2016-07-22 DIAGNOSIS — R030 Elevated blood-pressure reading, without diagnosis of hypertension: Secondary | ICD-10-CM | POA: Diagnosis not present

## 2016-07-22 DIAGNOSIS — R739 Hyperglycemia, unspecified: Secondary | ICD-10-CM

## 2016-07-22 DIAGNOSIS — E785 Hyperlipidemia, unspecified: Secondary | ICD-10-CM | POA: Diagnosis not present

## 2016-07-22 DIAGNOSIS — E039 Hypothyroidism, unspecified: Secondary | ICD-10-CM | POA: Diagnosis not present

## 2016-07-22 DIAGNOSIS — F32A Depression, unspecified: Secondary | ICD-10-CM

## 2016-07-22 DIAGNOSIS — F419 Anxiety disorder, unspecified: Secondary | ICD-10-CM

## 2016-07-22 NOTE — Assessment & Plan Note (Signed)
minimize simple carbs. Increase exercise as tolerated.  

## 2016-07-22 NOTE — Assessment & Plan Note (Signed)
Likely multifactorial, due to husband's illness and lack of sleep, stress but will check labs today

## 2016-07-22 NOTE — Patient Instructions (Signed)

## 2016-07-22 NOTE — Progress Notes (Signed)
Pre visit review using our clinic review tool, if applicable. No additional management support is needed unless otherwise documented below in the visit note. 

## 2016-07-22 NOTE — Progress Notes (Signed)
Patient ID: Morgan [name], female   DOB: September 14, 1951, 65 y.o.   MRN: [id number]   Subjective:    Patient ID: Morgan [name], female    DOB: [birth year], 65 y.o.   MRN: [id number]  Chief Complaint  Patient presents with  . Follow-up    HPI Patient is in today for follow up. Her ongoing complaint is fatigue. No recent illness or hospitalization. Her fatigue is better today than it was 2 weeks ago but not resolved. She continues to struggle with stress at home over her husband's illness and chronic pain. Acknowledges anhedonia but denies suicidal ideation. Denies CP/palp/SOB/HA/congestion/fevers/GI or GU c/o. Taking meds as prescribed   Past Medical History:  Diagnosis Date  . Allergy   . Anxiety and depression 02/22/2014  . Arthritis   . Depression   . Diastolic dysfunction    neg nuclear stress 08/09/12  EPIC  . Elevated BP 09/18/2014  . Elevated cholesterol   . History of colonic polyps   . Hyperglycemia 02/22/2014  . Mitral regurgitation   . OSA (obstructive sleep apnea)    no cpap-  per patient "mild " per study 4 years ago  . Preventative health care 10/24/2013  . Thyroid disease   . Tinnitus   . Upper respiratory infection 11/08/12   fever  with sinus congestion-   states improved    Past Surgical History:  Procedure Laterality Date  . JOINT REPLACEMENT  2008   rt knee  . POLYPECTOMY     colon  . TOTAL KNEE ARTHROPLASTY  04/22/2007   right  . TOTAL KNEE ARTHROPLASTY  11/30/2012   Procedure: TOTAL KNEE ARTHROPLASTY;  Surgeon: Gearlean Alf, MD;  Location: [facility];  Service: Orthopedics;  Laterality: Left;    Family History  Problem Relation Age of Onset  . Heart attack Father   . Hyperlipidemia Father   . Heart disease Father   . Cancer Father     BRAIN TUMOR, malignant  . Diabetes Father     diet controlled  . Arthritis Other   . Breast cancer Maternal Aunt   . Breast cancer Paternal Aunt   . Macular degeneration Mother   . Diabetes Brother     diet controlled    . Migraines Daughter   . Pulmonary embolism Daughter   . Diabetes Paternal Grandfather     Social History   Social History  . Marital status: Married    Spouse name: N/A  . Number of children: N/A  . Years of education: N/A   Occupational History  . Not on file.   Social History Main Topics  . Smoking status: Never Smoker  . Smokeless tobacco: Never Used  . Alcohol use Yes     Comment: rare  . Drug use: No  . 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 No major dietary restrictions   Other Topics Concern  . Not on file   Social History Narrative  . No narrative on file    Outpatient Medications Prior to Visit  Medication Sig Dispense Refill  . acyclovir (ZOVIRAX) 400 MG tablet Take 1 tablet (400 mg total) by mouth 3 (three) times daily. Take only as needed 15 tablet 1  . diazepam (VALIUM) 5 MG tablet Take 0.5-1 tablets (2.5-5 mg total) by mouth every 8 (eight) hours as needed for anxiety. 30 tablet 1  . DULoxetine (CYMBALTA) 30 MG capsule TAKE 1 CAPSULE EVERY  EVENING 90 capsule 2  . fluticasone (FLONASE) 50 MCG/ACT nasal spray Place 2 sprays into both nostrils daily. 16 g 6  . SYNTHROID 75 MCG tablet TAKE 1 TABLET DAILY 15 tablet 1  . amoxicillin (AMOXIL) 500 MG capsule Take 2 capsules (1,000 mg total) by mouth 2 (two) times daily. 40 capsule 0  . azelastine (ASTELIN) 0.1 % nasal spray Place 2 sprays into both nostrils at bedtime as needed for rhinitis. Use in each nostril as directed (Patient not taking: Reported on 07/22/2016) 30 mL 3  . CRESTOR 5 MG tablet Take 1 tablet (5 mg total) by mouth daily. (Patient not taking: Reported on 07/22/2016) 90 tablet 2   No facility-administered medications prior to visit.     Allergies  Allergen Reactions  . Atorvastatin     myalgias  . Simvastatin     myalgia  . Sulfonamide Derivatives     REACTION: Rash    Review of Systems  Constitutional: Positive for  malaise/fatigue. Negative for fever.  HENT: Negative for congestion.   Eyes: Negative for blurred vision.  Respiratory: Negative for shortness of breath.   Cardiovascular: Negative for chest pain, palpitations and leg swelling.  Gastrointestinal: Negative for abdominal pain, blood in stool and nausea.  Genitourinary: Negative for dysuria and frequency.  Musculoskeletal: Negative for falls.  Skin: Negative for rash.  Neurological: Negative for dizziness, loss of consciousness and headaches.  Endo/Heme/Allergies: Negative for environmental allergies.  Psychiatric/Behavioral: Negative for depression. The patient is not nervous/anxious.        Objective:    Physical Exam  Constitutional: She is oriented to person, place, and time. She appears well-developed and well-nourished. No distress.  HENT:  Head: Normocephalic and atraumatic.  Nose: Nose normal.  Eyes: Right eye exhibits no discharge. Left eye exhibits no discharge.  Neck: Normal range of motion. Neck supple.  Cardiovascular: Normal rate and regular rhythm.   No murmur heard. Pulmonary/Chest: Effort normal and breath sounds normal.  Abdominal: Soft. Bowel sounds are normal. There is no tenderness.  Musculoskeletal: She exhibits no edema.  Neurological: She is alert and oriented to person, place, and time.  Skin: Skin is warm and dry.  Psychiatric: She has a normal mood and affect.  Nursing note and vitals reviewed.   BP 130/80 (BP Location: Left Arm, Patient Position: Sitting, Cuff Size: Normal)   Pulse 65   Temp 98.5 F (36.9 C) (Oral)   Ht 5\' 3"  (1.6 m)   Wt 149 lb 4 oz (67.7 kg)   BMI 26.44 kg/m  Wt Readings from Last 3 Encounters:  07/22/16 149 lb 4 oz (67.7 kg)  01/19/16 150 lb (68 kg)  01/11/16 150 lb 6 oz (68.2 kg)     Lab Results  Component Value Date   WBC 7.7 01/19/2016   HGB 12.6 01/19/2016   HCT 38.3 01/19/2016   PLT 267 01/19/2016   GLUCOSE 87 01/19/2016   CHOL 179 01/19/2016   TRIG 88  01/19/2016   HDL 65 01/19/2016   LDLDIRECT 152.7 02/01/2013   LDLCALC 96 01/19/2016   ALT 23 01/19/2016   AST 26 01/19/2016   NA 141 01/19/2016   K 4.0 01/19/2016   CL 107 01/19/2016   CREATININE 0.60 01/19/2016   BUN 21 01/19/2016   CO2 22 01/19/2016   TSH 0.329 (L) 01/19/2016   INR 0.94 11/20/2012   HGBA1C 5.9 (H) 01/19/2016    Lab Results  Component Value Date   TSH 0.329 (L) 01/19/2016   Lab  Results  Component Value Date   WBC 7.7 01/19/2016   HGB 12.6 01/19/2016   HCT 38.3 01/19/2016   MCV 92.1 01/19/2016   PLT 267 01/19/2016   Lab Results  Component Value Date   NA 141 01/19/2016   K 4.0 01/19/2016   CO2 22 01/19/2016   GLUCOSE 87 01/19/2016   BUN 21 01/19/2016   CREATININE 0.60 01/19/2016   BILITOT 0.3 01/19/2016   ALKPHOS 82 01/19/2016   AST 26 01/19/2016   ALT 23 01/19/2016   PROT 7.1 01/19/2016   ALBUMIN 4.3 01/19/2016   CALCIUM 9.2 01/19/2016   GFR 95.32 03/09/2015   Lab Results  Component Value Date   CHOL 179 01/19/2016   Lab Results  Component Value Date   HDL 65 01/19/2016   Lab Results  Component Value Date   LDLCALC 96 01/19/2016   Lab Results  Component Value Date   TRIG 88 01/19/2016   Lab Results  Component Value Date   CHOLHDL 2.8 01/19/2016   Lab Results  Component Value Date   HGBA1C 5.9 (H) 01/19/2016       Assessment & Plan:   Problem List Items Addressed This Visit    Hypothyroidism - Primary    On Levothyroxine, continue to monitor      Relevant Orders   TSH   Hyperlipidemia    Has not been taking Crestor due to cost. Will check levels today before deciding on medicaitons. She may be interested in switching back to Atorvastatin.       Relevant Orders   Lipid panel   Hyperglycemia     minimize simple carbs. Increase exercise as tolerated.       Relevant Orders   Hemoglobin A1c   Anxiety and depression    Ongoing stressors noted, will increase Cymbalta to 60 mg daily      Elevated BP    Well  controlled, no changes. Encouraged heart healthy diet such as the DASH diet and exercise as tolerated.       Relevant Orders   CBC   Comprehensive metabolic panel    Other Visit Diagnoses   None.     I have discontinued Ms. Vancamp's azelastine, amoxicillin, and CRESTOR. I am also having her maintain her fluticasone, acyclovir, diazepam, DULoxetine, and SYNTHROID.  No orders of the defined types were placed in this encounter.    Penni Homans, MD

## 2016-07-22 NOTE — Assessment & Plan Note (Signed)
Well controlled, no changes. Encouraged heart healthy diet such as the DASH diet and exercise as tolerated.  

## 2016-07-22 NOTE — Assessment & Plan Note (Signed)
On Levothyroxine, continue to monitor 

## 2016-07-22 NOTE — Assessment & Plan Note (Signed)
Ongoing stressors noted, will increase Cymbalta to 60 mg daily

## 2016-07-22 NOTE — Assessment & Plan Note (Addendum)
Has not been taking Crestor due to cost. Will check levels today before deciding on medicaitons. She may be interested in switching back to Atorvastatin.

## 2016-07-23 LAB — COMPREHENSIVE METABOLIC PANEL
ALBUMIN: 4.1 g/dL (ref 3.5–5.2)
ALK PHOS: 72 U/L (ref 39–117)
ALT: 11 U/L (ref 0–35)
AST: 17 U/L (ref 0–37)
BUN: 18 mg/dL (ref 6–23)
CO2: 30 mEq/L (ref 19–32)
Calcium: 9.6 mg/dL (ref 8.4–10.5)
Chloride: 104 mEq/L (ref 96–112)
Creatinine, Ser: 0.72 mg/dL (ref 0.40–1.20)
GFR: 85.85 mL/min (ref >60.00)
GLUCOSE: 92 mg/dL (ref 70–99)
POTASSIUM: 4.3 meq/L (ref 3.5–5.1)
Sodium: 140 mEq/L (ref 135–145)
TOTAL PROTEIN: 7 g/dL (ref 6.0–8.3)
Total Bilirubin: 0.4 mg/dL (ref 0.2–1.2)

## 2016-07-23 LAB — CBC
HEMATOCRIT: 36.6 % (ref 36.0–46.0)
HEMOGLOBIN: 12.1 g/dL (ref 12.0–15.0)
MCHC: 33 g/dL (ref 30.0–36.0)
MCV: 92.4 fl (ref 78.0–100.0)
Platelets: 304 10*3/uL (ref 150.0–400.0)
RBC: 3.97 Mil/uL (ref 3.87–5.11)
RDW: 13.6 % (ref 11.5–15.5)
WBC: 7.3 10*3/uL (ref 4.0–10.5)

## 2016-07-23 LAB — LIPID PANEL
CHOLESTEROL: 233 mg/dL — AB (ref 0–200)
HDL: 53.3 mg/dL (ref >39.00)
LDL Cholesterol: 154 mg/dL — ABNORMAL HIGH (ref 0–99)
NonHDL: 179.33
Total CHOL/HDL Ratio: 4
Triglycerides: 128 mg/dL (ref 0.0–149.0)
VLDL: 25.6 mg/dL (ref 0.0–40.0)

## 2016-07-23 LAB — TSH: TSH: 0.57 u[IU]/mL (ref 0.35–4.50)

## 2016-07-23 LAB — HEMOGLOBIN A1C: Hgb A1c MFr Bld: 5.9 % (ref 4.6–6.5)

## 2016-07-29 ENCOUNTER — Other Ambulatory Visit: Payer: Self-pay | Admitting: Family Medicine

## 2016-07-29 MED ORDER — ATORVASTATIN CALCIUM 10 MG PO TABS: 10.0000 mg | ORAL_TABLET | Freq: Every day | ORAL | 0 refills | Status: DC

## 2016-08-10 ENCOUNTER — Other Ambulatory Visit: Payer: Self-pay | Admitting: Family Medicine

## 2016-10-24 ENCOUNTER — Other Ambulatory Visit: Payer: Self-pay

## 2016-10-24 MED ORDER — DULOXETINE HCL 30 MG PO CPEP: ORAL_CAPSULE | ORAL | 2 refills | Status: DC

## 2016-10-30 ENCOUNTER — Other Ambulatory Visit: Payer: Self-pay

## 2016-11-13 DIAGNOSIS — H52223 Regular astigmatism, bilateral: Secondary | ICD-10-CM | POA: Diagnosis not present

## 2016-11-13 DIAGNOSIS — H524 Presbyopia: Secondary | ICD-10-CM | POA: Diagnosis not present

## 2016-11-13 DIAGNOSIS — H5203 Hypermetropia, bilateral: Secondary | ICD-10-CM | POA: Diagnosis not present

## 2016-11-14 DIAGNOSIS — H5203 Hypermetropia, bilateral: Secondary | ICD-10-CM | POA: Diagnosis not present

## 2016-12-02 DIAGNOSIS — Z23 Encounter for immunization: Secondary | ICD-10-CM | POA: Diagnosis not present

## 2016-12-28 DIAGNOSIS — J0100 Acute maxillary sinusitis, unspecified: Secondary | ICD-10-CM | POA: Diagnosis not present

## 2017-01-20 ENCOUNTER — Other Ambulatory Visit: Payer: Self-pay | Admitting: Family Medicine

## 2017-01-23 ENCOUNTER — Other Ambulatory Visit: Payer: Self-pay | Admitting: Family Medicine

## 2017-01-23 DIAGNOSIS — E038 Other specified hypothyroidism: Secondary | ICD-10-CM | POA: Diagnosis not present

## 2017-01-23 DIAGNOSIS — R69 Illness, unspecified: Secondary | ICD-10-CM | POA: Diagnosis not present

## 2017-01-23 DIAGNOSIS — Z0000 Encounter for general adult medical examination without abnormal findings: Secondary | ICD-10-CM | POA: Diagnosis not present

## 2017-01-23 DIAGNOSIS — Z6826 Body mass index (BMI) 26.0-26.9, adult: Secondary | ICD-10-CM | POA: Diagnosis not present

## 2017-01-23 DIAGNOSIS — E785 Hyperlipidemia, unspecified: Secondary | ICD-10-CM | POA: Diagnosis not present

## 2017-02-17 ENCOUNTER — Ambulatory Visit (INDEPENDENT_AMBULATORY_CARE_PROVIDER_SITE_OTHER): Payer: Medicare HMO | Admitting: Family

## 2017-02-17 ENCOUNTER — Encounter: Payer: Self-pay | Admitting: Family

## 2017-02-17 VITALS — BP 118/63 | HR 68 | Temp 97.7°F | Resp 18 | Ht 63.0 in | Wt 148.8 lb

## 2017-02-17 DIAGNOSIS — N3000 Acute cystitis without hematuria: Secondary | ICD-10-CM | POA: Diagnosis not present

## 2017-02-17 DIAGNOSIS — R102 Pelvic and perineal pain: Secondary | ICD-10-CM | POA: Diagnosis not present

## 2017-02-17 DIAGNOSIS — B001 Herpesviral vesicular dermatitis: Secondary | ICD-10-CM

## 2017-02-17 LAB — POC URINALSYSI DIPSTICK (AUTOMATED)
Bilirubin, UA: NEGATIVE
Glucose, UA: NEGATIVE
Ketones, UA: NEGATIVE
Nitrite, UA: NEGATIVE
PROTEIN UA: NEGATIVE
RBC UA: NEGATIVE
SPEC GRAV UA: 1.025
UROBILINOGEN UA: NEGATIVE
pH, UA: 6

## 2017-02-17 MED ORDER — ACYCLOVIR 400 MG PO TABS: 400.0000 mg | ORAL_TABLET | Freq: Three times a day (TID) | ORAL | 1 refills | Status: DC

## 2017-02-17 MED ORDER — CIPROFLOXACIN HCL 250 MG PO TABS: 250.0000 mg | ORAL_TABLET | Freq: Two times a day (BID) | ORAL | 0 refills | Status: DC

## 2017-02-17 NOTE — Patient Instructions (Signed)
Please begin cipro for Urinary tract infection. Call if new/worsening symptoms or if you are not improved in 3 days.

## 2017-02-17 NOTE — Progress Notes (Signed)
Subjective:    Patient ID: Morgan [name], female    DOB: 06/27/1951, 66 y.o.   MRN: [id number]  HPI  Morgan [name] is a 66 yr old female who presents today with chief complaint of pelvic pressure/pain and urinary frequency. She reports that symptoms started about 2 days ago.  Noted cramping/aching feeling.  Reports that she took some azo last night.  She denies fever or hematuria or low back pain.     Review of Systems    see HPI  Past Medical History:  Diagnosis Date  . Allergy   . Anxiety and depression 02/22/2014  . Arthritis   . Depression   . Diastolic dysfunction    neg nuclear stress 08/09/12  EPIC  . Elevated BP 09/18/2014  . Elevated cholesterol   . History of colonic polyps   . Hyperglycemia 02/22/2014  . Mitral regurgitation   . OSA (obstructive sleep apnea)    no cpap-  per patient "mild " per study 4 years ago  . Preventative health care 10/24/2013  . Thyroid disease   . Tinnitus   . Upper respiratory infection 11/08/12   fever  with sinus congestion-   states improved     Social History   Social History  . Marital status: Married    Spouse name: N/A  . Number of children: N/A  . Years of education: N/A   Occupational History  . Not on file.   Social History Main Topics  . Smoking status: Never Smoker  . Smokeless tobacco: Never Used  . Alcohol use Yes     Comment: rare  . Drug use: No  . 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 No major dietary restrictions   Other Topics Concern  . Not on file   Social History Narrative  . No narrative on file    Past Surgical History:  Procedure Laterality Date  . JOINT REPLACEMENT  2008   rt knee  . POLYPECTOMY     colon  . TOTAL KNEE ARTHROPLASTY  04/22/2007   right  . TOTAL KNEE ARTHROPLASTY  11/30/2012   Procedure: TOTAL KNEE ARTHROPLASTY;  Surgeon: Gearlean Alf, MD;  Location: [facility];  Service: Orthopedics;  Laterality: Left;    Family History    Problem Relation Age of Onset  . Heart attack Father   . Hyperlipidemia Father   . Heart disease Father   . Cancer Father     BRAIN TUMOR, malignant  . Diabetes Father     diet controlled  . Arthritis Other   . Breast cancer Maternal Aunt   . Breast cancer Paternal Aunt   . Macular degeneration Mother   . Diabetes Brother     diet controlled  . Migraines Daughter   . Pulmonary embolism Daughter   . Diabetes Paternal Grandfather     Allergies  Allergen Reactions  . Simvastatin     myalgia  . Sulfonamide Derivatives     REACTION: Rash    Current Outpatient Prescriptions on File Prior to Visit  Medication Sig Dispense Refill  . acyclovir (ZOVIRAX) 400 MG tablet Take 1 tablet (400 mg total) by mouth 3 (three) times daily. Take only as needed 15 tablet 1  . atorvastatin (LIPITOR) 10 MG tablet TAKE 1 TABLET DAILY 90 tablet 0  . diazepam (VALIUM) 5 MG tablet Take 0.5-1 tablets (2.5-5 mg total) by mouth every 8 (eight) hours as needed for anxiety.  30 tablet 1  . DULoxetine (CYMBALTA) 30 MG capsule TAKE 1 CAPSULE EVERY       EVENING 90 capsule 2  . fluticasone (FLONASE) 50 MCG/ACT nasal spray Place 2 sprays into both nostrils daily. 16 g 6  . SYNTHROID 75 MCG tablet TAKE 1 TABLET DAILY 15 tablet 1   No current facility-administered medications on file prior to visit.     BP 118/63   Pulse 68   Temp 97.7 F (36.5 C) (Oral)   Resp 18   Ht 5\' 3"  (1.6 m)   Wt 148 lb 12.8 oz (67.5 kg)   SpO2 99% Comment: room air  BMI 26.36 kg/m    Objective:   Physical Exam  Constitutional: She is oriented to person, place, and time. She appears well-developed and well-nourished.  HENT:  Head: Normocephalic and atraumatic.  Cardiovascular: Normal rate, regular rhythm and normal heart sounds.   No murmur heard. Pulmonary/Chest: Effort normal and breath sounds normal. No respiratory distress. She has no wheezes.  Abdominal: Soft. Bowel sounds are normal. She exhibits no distension. There  is no tenderness. There is no rebound and no CVA tenderness.  Musculoskeletal: She exhibits no edema.  Lymphadenopathy:    She has no cervical adenopathy.  Neurological: She is alert and oriented to person, place, and time.  Psychiatric: She has a normal mood and affect. Her behavior is normal. Judgment and thought content normal.          Assessment & Plan:  UTI- UA notes + leuks. Will send for culture and begin cipro. She is advised to call if new/worsening symptoms or if not improved in 3 days.  She requests a refill on her acyclovir to have on hand in case of oral cold sore. Refill sent.

## 2017-02-17 NOTE — Progress Notes (Signed)
Pre visit review using our clinic review tool, if applicable. No additional management support is needed unless otherwise documented below in the visit note. 

## 2017-02-19 LAB — URINE CULTURE: Organism ID, Bacteria: NO GROWTH

## 2017-02-20 DIAGNOSIS — R69 Illness, unspecified: Secondary | ICD-10-CM | POA: Diagnosis not present

## 2017-04-15 ENCOUNTER — Ambulatory Visit (INDEPENDENT_AMBULATORY_CARE_PROVIDER_SITE_OTHER): Payer: Medicare HMO | Admitting: Family Medicine

## 2017-04-15 ENCOUNTER — Encounter (INDEPENDENT_AMBULATORY_CARE_PROVIDER_SITE_OTHER): Payer: Self-pay

## 2017-04-15 DIAGNOSIS — Z86010 Personal history of colonic polyps: Secondary | ICD-10-CM

## 2017-04-15 DIAGNOSIS — E785 Hyperlipidemia, unspecified: Secondary | ICD-10-CM

## 2017-04-15 DIAGNOSIS — R739 Hyperglycemia, unspecified: Secondary | ICD-10-CM

## 2017-04-15 DIAGNOSIS — F32A Depression, unspecified: Secondary | ICD-10-CM

## 2017-04-15 DIAGNOSIS — F419 Anxiety disorder, unspecified: Secondary | ICD-10-CM

## 2017-04-15 DIAGNOSIS — E039 Hypothyroidism, unspecified: Secondary | ICD-10-CM

## 2017-04-15 DIAGNOSIS — F329 Major depressive disorder, single episode, unspecified: Secondary | ICD-10-CM | POA: Diagnosis not present

## 2017-04-15 DIAGNOSIS — R69 Illness, unspecified: Secondary | ICD-10-CM | POA: Diagnosis not present

## 2017-04-15 NOTE — Assessment & Plan Note (Signed)
hgba1c acceptable, minimize simple carbs. Increase exercise as tolerated.  

## 2017-04-15 NOTE — Assessment & Plan Note (Signed)
On Levothyroxine, continue to monitor 

## 2017-04-15 NOTE — Assessment & Plan Note (Addendum)
Patient acknowledges that she is overdue for repeat colonoscopy but with the stress this year she has had trouble finding time for this appointment but she does agree to call gastroenterology after her move for her next colonoscopy

## 2017-04-15 NOTE — Progress Notes (Signed)
Subjective:  I acted as a Education administrator for Dr. Charlett Blake. [place], [place]   Patient ID: Morgan [name], female    DOB: 11-Jul-1951, 66 y.o.   MRN: [id number]  Chief Complaint  Patient presents with  . Follow-up    HPI  Patient is in today for a follow up. Patient in wanting lab work done. States she will call her GI physician to schedule her colonoscopy. She denies any GI concerns such as change in bowel habits, consipation or diarrhea. No recent febrile illness or hospitalizations. She is still recovering from her husband's suicide this year. Overall she feels she has done well with supportive family and friends. She believes her medications are working well. Denies CP/palp/SOB/HA/congestion/fevers/GI or GU c/o. Taking meds as prescribed  Patient Care Team: Mosie Lukes, MD as PCP - General (Family Medicine) Ladene Artist, MD as Consulting Physician (Gastroenterology)   Past Medical History:  Diagnosis Date  . Allergy   . Anxiety and depression 02/22/2014  . Arthritis   . Depression   . Diastolic dysfunction    neg nuclear stress 08/09/12  EPIC  . Elevated BP 09/18/2014  . Elevated cholesterol   . History of colonic polyps   . Hyperglycemia 02/22/2014  . Mitral regurgitation   . OSA (obstructive sleep apnea)    no cpap-  per patient "mild " per study 4 years ago  . Preventative health care 10/24/2013  . Thyroid disease   . Tinnitus   . Upper respiratory infection 11/08/12   fever  with sinus congestion-   states improved    Past Surgical History:  Procedure Laterality Date  . JOINT REPLACEMENT  2008   rt knee  . POLYPECTOMY     colon  . TOTAL KNEE ARTHROPLASTY  04/22/2007   right  . TOTAL KNEE ARTHROPLASTY  11/30/2012   Procedure: TOTAL KNEE ARTHROPLASTY;  Surgeon: Gearlean Alf, MD;  Location: [facility];  Service: Orthopedics;  Laterality: Left;    Family History  Problem Relation Age of Onset  . Heart attack Father   . Hyperlipidemia Father   . Heart disease Father   .  Cancer Father     BRAIN TUMOR, malignant  . Diabetes Father     diet controlled  . Arthritis Other   . Breast cancer Maternal Aunt   . Breast cancer Paternal Aunt   . Macular degeneration Mother   . Diabetes Brother     diet controlled  . Migraines Daughter   . Pulmonary embolism Daughter   . Diabetes Paternal Grandfather     Social History   Social History  . Marital status: Married    Spouse name: N/A  . Number of children: N/A  . Years of education: N/A   Occupational History  . Not on file.   Social History Main Topics  . Smoking status: Never Smoker  . Smokeless tobacco: Never Used  . Alcohol use Yes     Comment: rare  . Drug use: No  . 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 No major dietary restrictions   Other Topics Concern  . Not on file   Social History Narrative  . No narrative on file    Outpatient Medications Prior to Visit  Medication Sig Dispense Refill  . acyclovir (ZOVIRAX) 400 MG tablet Take 1 tablet (400 mg total) by mouth 3 (three) times daily. Take only as needed 15 tablet 1  . atorvastatin (  LIPITOR) 10 MG tablet TAKE 1 TABLET DAILY 90 tablet 0  . diazepam (VALIUM) 5 MG tablet Take 0.5-1 tablets (2.5-5 mg total) by mouth every 8 (eight) hours as needed for anxiety. 30 tablet 1  . DULoxetine (CYMBALTA) 30 MG capsule TAKE 1 CAPSULE EVERY       EVENING 90 capsule 2  . fluticasone (FLONASE) 50 MCG/ACT nasal spray Place 2 sprays into both nostrils daily. 16 g 6  . SYNTHROID 75 MCG tablet TAKE 1 TABLET DAILY 15 tablet 1  . ciprofloxacin (CIPRO) 250 MG tablet Take 1 tablet (250 mg total) by mouth 2 (two) times daily. 6 tablet 0   No facility-administered medications prior to visit.     Allergies  Allergen Reactions  . Simvastatin     myalgia  . Sulfonamide Derivatives     REACTION: Rash    Review of Systems  Constitutional: Negative for fever and malaise/fatigue.  HENT: Negative for  congestion.   Eyes: Negative for blurred vision.  Respiratory: Negative for cough and shortness of breath.   Cardiovascular: Negative for chest pain, palpitations and leg swelling.  Gastrointestinal: Negative for vomiting.  Musculoskeletal: Negative for back pain.  Skin: Negative for rash.  Neurological: Negative for loss of consciousness and headaches.  Psychiatric/Behavioral: Negative for depression, hallucinations, substance abuse and suicidal ideas.       Objective:    Physical Exam  Constitutional: She is oriented to person, place, and time. She appears well-developed and well-nourished. No distress.  HENT:  Head: Normocephalic and atraumatic.  Eyes: Conjunctivae are normal.  Neck: Normal range of motion. No thyromegaly present.  Cardiovascular: Normal rate and regular rhythm.   Pulmonary/Chest: Effort normal and breath sounds normal. She has no wheezes.  Abdominal: Soft. Bowel sounds are normal. There is no tenderness.  Musculoskeletal: Normal range of motion. She exhibits no edema or deformity.  Neurological: She is alert and oriented to person, place, and time.  Skin: Skin is warm and dry. She is not diaphoretic.  Psychiatric: She has a normal mood and affect.    There were no vitals taken for this visit. Wt Readings from Last 3 Encounters:  02/17/17 148 lb 12.8 oz (67.5 kg)  07/22/16 149 lb 4 oz (67.7 kg)  01/19/16 150 lb (68 kg)   BP Readings from Last 3 Encounters:  02/17/17 118/63  07/22/16 130/80  01/19/16 132/72     Immunization History  Administered Date(s) Administered  . Influenza Split 11/29/2011, 10/23/2012  . Influenza Whole 09/30/2008, 11/10/2010  . Influenza,inj,Quad PF,36+ Mos 10/01/2013, 09/13/2014, 10/03/2015, 10/30/2016  . Pneumococcal Conjugate-13 10/22/2013  . Pneumococcal Polysaccharide-23 03/16/2015  . Td 12/30/2005, 01/19/2016  . Zoster 08/02/2011    Health Maintenance  Topic Date Due  . COLONOSCOPY  11/16/2015  . INFLUENZA VACCINE   07/30/2017  . MAMMOGRAM  01/29/2018  . TETANUS/TDAP  01/18/2026  . DEXA SCAN  Completed  . Hepatitis C Screening  Completed  . PNA vac Low Risk Adult  Completed    Lab Results  Component Value Date   WBC 7.8 04/15/2017   HGB 12.4 04/15/2017   HCT 37.6 04/15/2017   PLT 305.0 04/15/2017   GLUCOSE 92 04/15/2017   CHOL 182 04/15/2017   TRIG 110.0 04/15/2017   HDL 59.00 04/15/2017   LDLDIRECT 152.7 02/01/2013   LDLCALC 101 (H) 04/15/2017   ALT 14 04/15/2017   AST 20 04/15/2017   NA 140 04/15/2017   K 4.4 04/15/2017   CL 104 04/15/2017   CREATININE 0.89  04/15/2017   BUN 19 04/15/2017   CO2 29 04/15/2017   TSH 0.28 (L) 04/15/2017   INR 0.94 11/20/2012   HGBA1C 5.9 04/15/2017    Lab Results  Component Value Date   TSH 0.28 (L) 04/15/2017   Lab Results  Component Value Date   WBC 7.8 04/15/2017   HGB 12.4 04/15/2017   HCT 37.6 04/15/2017   MCV 93.7 04/15/2017   PLT 305.0 04/15/2017   Lab Results  Component Value Date   NA 140 04/15/2017   K 4.4 04/15/2017   CO2 29 04/15/2017   GLUCOSE 92 04/15/2017   BUN 19 04/15/2017   CREATININE 0.89 04/15/2017   BILITOT 0.4 04/15/2017   ALKPHOS 81 04/15/2017   AST 20 04/15/2017   ALT 14 04/15/2017   PROT 7.2 04/15/2017   ALBUMIN 4.2 04/15/2017   CALCIUM 9.5 04/15/2017   GFR 67.07 04/15/2017   Lab Results  Component Value Date   CHOL 182 04/15/2017   Lab Results  Component Value Date   HDL 59.00 04/15/2017   Lab Results  Component Value Date   LDLCALC 101 (H) 04/15/2017   Lab Results  Component Value Date   TRIG 110.0 04/15/2017   Lab Results  Component Value Date   CHOLHDL 3 04/15/2017   Lab Results  Component Value Date   HGBA1C 5.9 04/15/2017         Assessment & Plan:   Problem List Items Addressed This Visit    Hypothyroidism    On Levothyroxine, continue to monitor      Relevant Orders   TSH (Completed)   Hyperlipidemia - Primary    Encouraged heart healthy diet, increase exercise,  avoid trans fats, consider a krill oil cap daily      Relevant Orders   Lipid panel (Completed)   Hyperglycemia    hgba1c acceptable, minimize simple carbs. Increase exercise as tolerated.       Relevant Orders   CBC (Completed)   Comprehensive metabolic panel (Completed)   Hemoglobin A1c (Completed)   Anxiety and depression    Patient was widowed this past year when her husband committed suicide. She is doing well and while she has been sad and stressed but she feels she has been tolerating her situation well. Is in the process of trying to sell her home and move but she does not feel she needs any change to medications.       COLONIC POLYPS, HX OF    Patient acknowledges that she is overdue for repeat colonoscopy but with the stress this year she has had trouble finding time for this appointment but she does agree to call gastroenterology after her move for her next colonoscopy         I have discontinued Ms. Aday's ciprofloxacin. I am also having her maintain her fluticasone, diazepam, SYNTHROID, DULoxetine, atorvastatin, and acyclovir.  No orders of the defined types were placed in this encounter.   CMA served as Education administrator during this visit. History, Physical and Plan performed by medical provider. Documentation and orders reviewed and attested to.  Penni Homans, MD

## 2017-04-15 NOTE — Progress Notes (Signed)
Pre visit review using our clinic review tool, if applicable. No additional management support is needed unless otherwise documented below in the visit note. 

## 2017-04-15 NOTE — Assessment & Plan Note (Signed)
Encouraged heart healthy diet, increase exercise, avoid trans fats, consider a krill oil cap daily 

## 2017-04-15 NOTE — Patient Instructions (Signed)

## 2017-04-16 ENCOUNTER — Encounter: Payer: Medicare HMO | Admitting: Gastroenterology

## 2017-04-16 LAB — TSH: TSH: 0.28 u[IU]/mL — ABNORMAL LOW (ref 0.35–4.50)

## 2017-04-16 LAB — LIPID PANEL
CHOLESTEROL: 182 mg/dL (ref 0–200)
HDL: 59 mg/dL (ref >39.00)
LDL Cholesterol: 101 mg/dL — ABNORMAL HIGH (ref 0–99)
NonHDL: 123.07
Total CHOL/HDL Ratio: 3
Triglycerides: 110 mg/dL (ref 0.0–149.0)
VLDL: 22 mg/dL (ref 0.0–40.0)

## 2017-04-16 LAB — CBC
HCT: 37.6 % (ref 36.0–46.0)
HEMOGLOBIN: 12.4 g/dL (ref 12.0–15.0)
MCHC: 32.8 g/dL (ref 30.0–36.0)
MCV: 93.7 fl (ref 78.0–100.0)
PLATELETS: 305 10*3/uL (ref 150.0–400.0)
RBC: 4.02 Mil/uL (ref 3.87–5.11)
RDW: 13.3 % (ref 11.5–15.5)
WBC: 7.8 10*3/uL (ref 4.0–10.5)

## 2017-04-16 LAB — COMPREHENSIVE METABOLIC PANEL
ALK PHOS: 81 U/L (ref 39–117)
ALT: 14 U/L (ref 0–35)
AST: 20 U/L (ref 0–37)
Albumin: 4.2 g/dL (ref 3.5–5.2)
BILIRUBIN TOTAL: 0.4 mg/dL (ref 0.2–1.2)
BUN: 19 mg/dL (ref 6–23)
CO2: 29 meq/L (ref 19–32)
CREATININE: 0.89 mg/dL (ref 0.40–1.20)
Calcium: 9.5 mg/dL (ref 8.4–10.5)
Chloride: 104 mEq/L (ref 96–112)
GFR: 67.07 mL/min (ref >60.00)
Glucose, Bld: 92 mg/dL (ref 70–99)
Potassium: 4.4 mEq/L (ref 3.5–5.1)
Sodium: 140 mEq/L (ref 135–145)
TOTAL PROTEIN: 7.2 g/dL (ref 6.0–8.3)

## 2017-04-16 LAB — HEMOGLOBIN A1C: Hgb A1c MFr Bld: 5.9 % (ref 4.6–6.5)

## 2017-04-17 NOTE — Assessment & Plan Note (Signed)
Patient was widowed this past year when her husband committed suicide. She is doing well and while she has been sad and stressed but she feels she has been tolerating her situation well. Is in the process of trying to sell her home and move but she does not feel she needs any change to medications.

## 2017-06-18 ENCOUNTER — Ambulatory Visit (INDEPENDENT_AMBULATORY_CARE_PROVIDER_SITE_OTHER): Payer: Medicare HMO | Admitting: Family Medicine

## 2017-06-18 ENCOUNTER — Other Ambulatory Visit: Payer: Self-pay | Admitting: Family Medicine

## 2017-06-18 ENCOUNTER — Ambulatory Visit (HOSPITAL_BASED_OUTPATIENT_CLINIC_OR_DEPARTMENT_OTHER)
Admission: RE | Admit: 2017-06-18 | Discharge: 2017-06-18 | Disposition: A | Discharge status: Home / Self Care | Payer: Medicare HMO | Source: Ambulatory Visit | Attending: Family Medicine | Admitting: Family Medicine

## 2017-06-18 VITALS — BP 110/72 | HR 70 | Temp 98.0°F | Ht 63.0 in | Wt 144.2 lb

## 2017-06-18 DIAGNOSIS — R5383 Other fatigue: Secondary | ICD-10-CM | POA: Diagnosis not present

## 2017-06-18 DIAGNOSIS — R0781 Pleurodynia: Secondary | ICD-10-CM

## 2017-06-18 DIAGNOSIS — F329 Major depressive disorder, single episode, unspecified: Secondary | ICD-10-CM | POA: Diagnosis not present

## 2017-06-18 DIAGNOSIS — Z1231 Encounter for screening mammogram for malignant neoplasm of breast: Secondary | ICD-10-CM

## 2017-06-18 DIAGNOSIS — L659 Nonscarring hair loss, unspecified: Secondary | ICD-10-CM

## 2017-06-18 DIAGNOSIS — R0602 Shortness of breath: Secondary | ICD-10-CM | POA: Diagnosis not present

## 2017-06-18 DIAGNOSIS — R69 Illness, unspecified: Secondary | ICD-10-CM | POA: Diagnosis not present

## 2017-06-18 DIAGNOSIS — F32A Depression, unspecified: Secondary | ICD-10-CM

## 2017-06-18 DIAGNOSIS — F419 Anxiety disorder, unspecified: Secondary | ICD-10-CM

## 2017-06-18 DIAGNOSIS — R7989 Other specified abnormal findings of blood chemistry: Secondary | ICD-10-CM | POA: Diagnosis not present

## 2017-06-18 DIAGNOSIS — I700 Atherosclerosis of aorta: Secondary | ICD-10-CM | POA: Insufficient documentation

## 2017-06-18 DIAGNOSIS — E039 Hypothyroidism, unspecified: Secondary | ICD-10-CM | POA: Diagnosis not present

## 2017-06-18 DIAGNOSIS — R079 Chest pain, unspecified: Secondary | ICD-10-CM | POA: Diagnosis not present

## 2017-06-18 DIAGNOSIS — R0789 Other chest pain: Secondary | ICD-10-CM

## 2017-06-18 LAB — VITAMIN D 25 HYDROXY (VIT D DEFICIENCY, FRACTURES): VITD: 30.64 ng/mL (ref 30.00–100.00)

## 2017-06-18 LAB — BASIC METABOLIC PANEL
BUN: 18 mg/dL (ref 6–23)
CALCIUM: 9.3 mg/dL (ref 8.4–10.5)
CO2: 26 meq/L (ref 19–32)
Chloride: 105 mEq/L (ref 96–112)
Creatinine, Ser: 0.68 mg/dL (ref 0.40–1.20)
GFR: 91.45 mL/min (ref >60.00)
GLUCOSE: 92 mg/dL (ref 70–99)
Potassium: 4.2 mEq/L (ref 3.5–5.1)
SODIUM: 139 meq/L (ref 135–145)

## 2017-06-18 LAB — TSH: TSH: 0.12 u[IU]/mL — AB (ref 0.35–4.50)

## 2017-06-18 LAB — TROPONIN I: TNIDX: 0.01 ug/L (ref 0.00–0.06)

## 2017-06-18 LAB — D-DIMER, QUANTITATIVE (NOT AT ARMC): D DIMER QUANT: 0.78 ug{FEU}/mL — AB (ref <0.50)

## 2017-06-18 LAB — FERRITIN: Ferritin: 90.5 ng/mL (ref 10.0–291.0)

## 2017-06-18 MED ORDER — DULOXETINE HCL 30 MG PO CPEP: ORAL_CAPSULE | ORAL | 3 refills | Status: DC

## 2017-06-18 MED ORDER — DIAZEPAM 5 MG PO TABS: 2.5000 mg | ORAL_TABLET | Freq: Three times a day (TID) | ORAL | 1 refills | Status: DC | PRN

## 2017-06-18 MED ORDER — IOPAMIDOL (ISOVUE-370) INJECTION 76%
100.0000 mL | Freq: Once | INTRAVENOUS | Status: AC | PRN
  Administered 2017-06-18: 100 mL via INTRAVENOUS

## 2017-06-18 NOTE — Patient Instructions (Signed)
It was great to see you again- we will be in touch with your labs and films asap If your D dimer test is high we will plan to do a CT scan of your chest I agree that you should see dermatology regarding the lesion on your leg Try increasing your cymbalta to 30 mg twice a day- let us know how this works for you We will refill your valium as well

## 2017-06-18 NOTE — Progress Notes (Addendum)
[place] at [facility] [address] [place], [place] [address] [phone]  Date:  06/18/2017   Name:  Morgan [name]   DOB:  Feb 26, 1951   MRN:  [id number]  PCP:  Mosie Lukes, MD    Chief Complaint: Chest Pain (c/o constant rib pain  x 2 weeks. Pt states that ibuprofen helps some. Pain worse with certain movements. )   History of Present Illness:  Morgan [name] is a 66 y.o. very pleasant female patient who presents with the following:  I know this patient from taking care of her husband who sadly committed suicide last year.  She is here today with concern about a pain in her left ribs.   She moved not long ago- she is in a new home that has a fenced yard which is great for her dogs She moved about one month ago- she did a lot of the packing and moving herself.  Her back was sore right after the move but it is now better She has felt "tired and exhausted," is not sure how much of this is physical and how much is emotional.   She has noted a left rib pain for the last 1-2 weeks.  Notes a pain under her left breast which wraps around the left side and into her left back.  The right side is ok No rash noted  She does not feel SOB in particular, but she will feel the pain if she takes a deep breath The pain seems to be constant, worse with movement of her trunk She has tried some ibuprofen which does help some  No recent cough or fever No CV history. No previous issues with chest tightness or chest pain No history of DVT or PE.  However her daughter did have a PE-[name] is not sure, but she may have a clotting disorder.  Her last mammo was last year- she would like to update this soon  We will repeat her TSH for her today She is also concerned about hair loss and general fatigue  Wt Readings from Last 3 Encounters:  06/18/17 144 lb 3.2 oz (65.4 kg)  02/17/17 148 lb 12.8 oz (67.5 kg)  07/22/16 149 lb 4 oz (67.7 kg)   She has  been on cymbalta for 10 years.  Denies any thoughts of self harm.  She is currently taking 30 mg of cymbalta a day, would be open to increasing to 60 mg  She does use valium prn for sleep and needs a refill- will call into [facility] [employer] at [place]: no entries  Patient Active Problem List   Diagnosis Date Noted  . Medicare annual wellness visit, subsequent 03/27/2015  . Elevated BP 09/18/2014  . Anxiety and depression 02/22/2014  . Hyperglycemia 02/22/2014  . Preventative health care 10/24/2013  . MITRAL REGURGITATION, MILD 07/24/2010  . ATRIAL ENLARGEMENT, LEFT 07/24/2010  . OBSTRUCTIVE SLEEP APNEA 07/04/2010  . DIASTOLIC DYSFUNCTION 10/62/6948  . Overweight 06/22/2009  . Actinic keratosis 02/15/2009  . MALAISE AND FATIGUE 07/14/2008  . Disorder of bone and cartilage 03/29/2008  . SYMPTOMATIC MENOPAUSAL/FEMALE CLIMACTERIC STATES 01/18/2008  . COLONIC POLYPS, HX OF 01/18/2008  . FREQUENCY, URINARY 07/23/2007  . Hyperlipidemia 07/14/2007  . Hypothyroidism 06/29/2007  . ALLERGIC RHINITIS 06/29/2007  . OSTEOARTHRITIS 06/29/2007    Past Medical History:  Diagnosis Date  . Allergy   . Anxiety and depression 02/22/2014  . Arthritis   .  Depression   . Diastolic dysfunction    neg nuclear stress 08/09/12  EPIC  . Elevated BP 09/18/2014  . Elevated cholesterol   . History of colonic polyps   . Hyperglycemia 02/22/2014  . Mitral regurgitation   . OSA (obstructive sleep apnea)    no cpap-  per patient "mild " per study 4 years ago  . Preventative health care 10/24/2013  . Thyroid disease   . Tinnitus   . Upper respiratory infection 11/08/12   fever  with sinus congestion-   states improved    Past Surgical History:  Procedure Laterality Date  . JOINT REPLACEMENT  2008   rt knee  . POLYPECTOMY     colon  . TOTAL KNEE ARTHROPLASTY  04/22/2007   right  . TOTAL KNEE ARTHROPLASTY  11/30/2012   Procedure: TOTAL KNEE ARTHROPLASTY;  Surgeon: Gearlean Alf, MD;  Location:  [facility];  Service: Orthopedics;  Laterality: Left;    Social History  Substance Use Topics  . Smoking status: Never Smoker  . Smokeless tobacco: Never Used  . Alcohol use Yes     Comment: rare    Family History  Problem Relation Age of Onset  . Heart attack Father   . Hyperlipidemia Father   . Heart disease Father   . Cancer Father        BRAIN TUMOR, malignant  . Diabetes Father        diet controlled  . Arthritis Other   . Breast cancer Maternal Aunt   . Breast cancer Paternal Aunt   . Macular degeneration Mother   . Diabetes Brother        diet controlled  . Migraines Daughter   . Pulmonary embolism Daughter   . Diabetes Paternal Grandfather     Allergies  Allergen Reactions  . Simvastatin     myalgia  . Sulfonamide Derivatives     REACTION: Rash    Medication list has been reviewed and updated.  Current Outpatient Prescriptions on File Prior to Visit  Medication Sig Dispense Refill  . acyclovir (ZOVIRAX) 400 MG tablet Take 1 tablet (400 mg total) by mouth 3 (three) times daily. Take only as needed 15 tablet 1  . atorvastatin (LIPITOR) 10 MG tablet TAKE 1 TABLET DAILY 90 tablet 0  . diazepam (VALIUM) 5 MG tablet Take 0.5-1 tablets (2.5-5 mg total) by mouth every 8 (eight) hours as needed for anxiety. 30 tablet 1  . DULoxetine (CYMBALTA) 30 MG capsule TAKE 1 CAPSULE EVERY       EVENING 90 capsule 2  . fluticasone (FLONASE) 50 MCG/ACT nasal spray Place 2 sprays into both nostrils daily. 16 g 6  . SYNTHROID 75 MCG tablet TAKE 1 TABLET DAILY 15 tablet 1   No current facility-administered medications on file prior to visit.     Review of Systems:  As per HPI- otherwise negative.   Physical Examination: Vitals:   06/18/17 1020  BP: 110/72  Pulse: 70  Temp: 98 F (36.7 C)   Vitals:   06/18/17 1020  Weight: 144 lb 3.2 oz (65.4 kg)  Height: 5\' 3"  (1.6 m)   Body mass index is 25.54 kg/m. Ideal Body Weight: Weight in (lb) to have BMI = 25: 140.8  GEN:  WDWN, NAD, Non-toxic, A & O x 3, looks well, normal weight HEENT: Atraumatic, Normocephalic. Neck supple. No masses, No LAD. Ears and Nose: No external deformity. CV: RRR, No M/G/R. No JVD. No thrill. No extra heart sounds. PULM:  CTA B, no wheezes, crackles, rhonchi. No retractions. No resp. distress. No accessory muscle use. She notes a reproducible tenderness just inferior to the LEFT breast and over the left lateal ribs.  Tenderness does not extend into her back.  No rash or lesion to suggest shingles.  No breast mass or axillary nodes palpated  ABD: S, NT, ND, +BS. No rebound. No HSM. EXTR: No c/c/e NEURO Normal gait.  PSYCH: Normally interactive. Conversant. Not depressed or anxious appearing.  Calm demeanor.   EKG:  SR, no significant change from previous EKG  Assessment and Plan: Rib pain on left side - Plan: EKG 12-Lead, DG Chest 2 View, DG Ribs Unilateral Left, D-Dimer, Quantitative, Troponin I, CANCELED: D-Dimer, Quantitative, CANCELED: Troponin I  Hypothyroidism, unspecified type - Plan: TSH  Fatigue, unspecified type - Plan: TSH, Vitamin D (25 hydroxy)  Hair loss - Plan: Ferritin  Anxiety and depression - Plan: DULoxetine (CYMBALTA) 30 MG capsule, diazepam (VALIUM) 5 MG tablet  Here today with left side pain.  Pain is reproducible and has been present for at least a week, so dangerous etiology is less likely.  However pt does have daughter with history of unprovoked PE.  She would like to proceed with some further eval.  Will order labs as above. D dimer in hopes that it will be negative Refilled her prn valium and will also increase her cymbalta to 60 mg a day Meds ordered this encounter  Medications  . DULoxetine (CYMBALTA) 30 MG capsule    Sig: TAKE 1 CAPSULE BID    Dispense:  180 capsule    Refill:  3  . diazepam (VALIUM) 5 MG tablet    Sig: Take 0.5-1 tablets (2.5-5 mg total) by mouth every 8 (eight) hours as needed for anxiety.    Dispense:  30 tablet    Refill:   1   Signed Lamar Blinks, MD  Received her stat labs and x-ray reports  Called and LMOM- all ok except D dimer is high.  Will order CT angiogram- added on stat BMP to her labs to check renal function.  Labs ok, scheduled CT angiogram for this evening  Results for orders placed or performed in visit on 06/18/17  D-Dimer, Quantitative  Result Value Ref Range   D-Dimer, Quant 0.78 (H) <0.50 mcg/mL FEU  Troponin I  Result Value Ref Range   TNIDX 0.01 0.00 - 0.06 ug/l   Dg Chest 2 View  Result Date: 06/18/2017 CLINICAL DATA:  Shortness of breath, pain EXAM: CHEST  2 VIEW COMPARISON:  None. FINDINGS: The heart size and mediastinal contours are within normal limits. Both lungs are clear. The visualized skeletal structures are unremarkable. IMPRESSION: No active cardiopulmonary disease. Electronically Signed   By: Kathreen Devoid   On: 06/18/2017 11:57   Dg Ribs Unilateral Left  Result Date: 06/18/2017 CLINICAL DATA:  Left breast pain, shortness of Breath EXAM: LEFT RIBS - 2 VIEW COMPARISON:  None. FINDINGS: No fracture or other bone lesions are seen involving the ribs. IMPRESSION: Negative. Electronically Signed   By: Rolm Baptise M.D.   On: 06/18/2017 12:53   Ct Angio Chest W/cm &/or Wo Cm  Result Date: 06/18/2017 CLINICAL DATA:  Rib pain and chest pain.  Positive D-dimer. EXAM: CT ANGIOGRAPHY CHEST WITH CONTRAST TECHNIQUE: Multidetector CT imaging of the chest was performed using the standard protocol during bolus administration of intravenous contrast. Multiplanar CT image reconstructions and MIPs were obtained to evaluate the vascular anatomy. CONTRAST:  100 mL Isovue  370 COMPARISON:  Chest radiograph 06/18/2017 FINDINGS: Cardiovascular: Contrast injection is sufficient to demonstrate satisfactory opacification of the pulmonary arteries to the segmental level. There is no pulmonary embolus. The main pulmonary artery is within normal limits for size. There is no CT evidence of acute right heart  strain. Mild aortic atherosclerotic calcification. There is a normal 3-vessel arch branching pattern. Heart size is normal, without pericardial effusion. Mediastinum/Nodes: No mediastinal, hilar or axillary lymphadenopathy. The visualized thyroid and thoracic esophageal course are unremarkable. Lungs/Pleura: No pulmonary nodules or masses. No pleural effusion or pneumothorax. No focal airspace consolidation. No focal pleural abnormality. Upper Abdomen: Contrast bolus timing is not optimized for evaluation of the abdominal organs. Within this limitation, the visualized organs of the upper abdomen are normal. Musculoskeletal: No chest wall abnormality. No acute or significant osseous findings. Review of the MIP images confirms the above findings. IMPRESSION: 1. No pulmonary embolism or other acute thoracic abnormality. 2.  Aortic Atherosclerosis (ICD10-I70.0). Electronically Signed   By: Ulyses Jarred M.D.   On: 06/18/2017 18:43   Received negative CT angiogram at about 7pm- called pt to give her the good news.  She plans to use conservative measures for presumed MSK pain and will let me know if not better soon I will touch base with her to discuss her other labs tomorrow    Received the rest of her labs and gave her a call 6/21- her TSH is still low.  Since she had 2 suppressed TSH in a row we will decrease her synthroid slightly. She is in agreement - will drop her to 50 mcg and repeat TSH in 1 month  Results for orders placed or performed in visit on 06/18/17  TSH  Result Value Ref Range   TSH 0.12 (L) 0.35 - 4.50 uIU/mL  Vitamin D (25 hydroxy)  Result Value Ref Range   VITD 30.64 30.00 - 100.00 ng/mL  Ferritin  Result Value Ref Range   Ferritin 90.5 10.0 - 291.0 ng/mL  D-Dimer, Quantitative  Result Value Ref Range   D-Dimer, Quant 0.78 (H) <0.50 mcg/mL FEU  Troponin I  Result Value Ref Range   TNIDX 0.01 0.00 - 0.06 ug/l  Basic metabolic panel  Result Value Ref Range   Sodium 139 135 -  145 mEq/L   Potassium 4.2 3.5 - 5.1 mEq/L   Chloride 105 96 - 112 mEq/L   CO2 26 19 - 32 mEq/L   Glucose, Bld 92 70 - 99 mg/dL   BUN 18 6 - 23 mg/dL   Creatinine, Ser 0.68 0.40 - 1.20 mg/dL   Calcium 9.3 8.4 - 10.5 mg/dL   GFR 91.45 >60.00 mL/min

## 2017-06-19 ENCOUNTER — Ambulatory Visit (HOSPITAL_BASED_OUTPATIENT_CLINIC_OR_DEPARTMENT_OTHER): Payer: Medicare HMO

## 2017-06-19 ENCOUNTER — Other Ambulatory Visit: Payer: Medicare HMO

## 2017-06-19 MED ORDER — LEVOTHYROXINE SODIUM 50 MCG PO TABS: 50.0000 ug | ORAL_TABLET | Freq: Every day | ORAL | 3 refills | Status: DC

## 2017-06-19 NOTE — Addendum Note (Signed)
Addended by: Lamar Blinks C on: 06/19/2017 07:11 PM   Modules accepted: Orders

## 2017-06-20 ENCOUNTER — Encounter: Payer: Self-pay | Admitting: Family Medicine

## 2017-07-03 ENCOUNTER — Encounter (HOSPITAL_BASED_OUTPATIENT_CLINIC_OR_DEPARTMENT_OTHER): Payer: Self-pay

## 2017-07-03 ENCOUNTER — Ambulatory Visit (HOSPITAL_BASED_OUTPATIENT_CLINIC_OR_DEPARTMENT_OTHER)
Admission: RE | Admit: 2017-07-03 | Discharge: 2017-07-03 | Disposition: A | Discharge status: Home / Self Care | Payer: Medicare HMO | Source: Ambulatory Visit | Attending: Family Medicine | Admitting: Family Medicine

## 2017-07-03 DIAGNOSIS — R928 Other abnormal and inconclusive findings on diagnostic imaging of breast: Secondary | ICD-10-CM | POA: Diagnosis not present

## 2017-07-03 DIAGNOSIS — N6310 Unspecified lump in the right breast, unspecified quadrant: Secondary | ICD-10-CM | POA: Insufficient documentation

## 2017-07-03 DIAGNOSIS — Z1231 Encounter for screening mammogram for malignant neoplasm of breast: Secondary | ICD-10-CM | POA: Insufficient documentation

## 2017-07-04 ENCOUNTER — Other Ambulatory Visit: Payer: Self-pay | Admitting: Family Medicine

## 2017-07-04 DIAGNOSIS — R928 Other abnormal and inconclusive findings on diagnostic imaging of breast: Secondary | ICD-10-CM

## 2017-07-07 ENCOUNTER — Telehealth: Payer: Self-pay | Admitting: *Deleted

## 2017-07-07 NOTE — Telephone Encounter (Signed)
Received Physician Orders from [place], forwarded to provider/SLS 07/09

## 2017-07-10 ENCOUNTER — Ambulatory Visit
Admission: RE | Admit: 2017-07-10 | Discharge: 2017-07-10 | Disposition: A | Discharge status: Home / Self Care | Payer: Medicare HMO | Source: Ambulatory Visit | Attending: Family Medicine | Admitting: Family Medicine

## 2017-07-10 DIAGNOSIS — R928 Other abnormal and inconclusive findings on diagnostic imaging of breast: Secondary | ICD-10-CM

## 2017-07-10 DIAGNOSIS — N6489 Other specified disorders of breast: Secondary | ICD-10-CM | POA: Diagnosis not present

## 2017-07-10 DIAGNOSIS — R922 Inconclusive mammogram: Secondary | ICD-10-CM | POA: Diagnosis not present

## 2017-08-05 ENCOUNTER — Telehealth: Payer: Self-pay | Admitting: Family Medicine

## 2017-08-05 NOTE — Telephone Encounter (Signed)
Self.   Refill for: SYNTHROID - 90 day supply. Pt says that she need 15 MCG it works better for her.     Pharmacy: [facility], [address]

## 2017-08-08 MED ORDER — LEVOTHYROXINE SODIUM 75 MCG PO TABS: 75.0000 ug | ORAL_TABLET | Freq: Every day | ORAL | 1 refills | Status: DC

## 2017-08-08 NOTE — Telephone Encounter (Signed)
Medication has been sent to pharmacy.  pC

## 2017-08-11 ENCOUNTER — Encounter: Payer: Self-pay | Admitting: Gastroenterology

## 2017-08-22 ENCOUNTER — Telehealth: Payer: Self-pay | Admitting: Family Medicine

## 2017-08-22 NOTE — Telephone Encounter (Signed)
Pt has moved and would like to see if Dr Maudie Mercury transferring from Dr. Charlett Blake.  Is it okay for the transfer?

## 2017-08-24 NOTE — Telephone Encounter (Signed)
Of course.if OK with Dr Maudie Mercury

## 2017-08-25 DIAGNOSIS — R69 Illness, unspecified: Secondary | ICD-10-CM | POA: Diagnosis not present

## 2017-08-25 NOTE — Telephone Encounter (Signed)
Ok with me 

## 2017-08-25 NOTE — Telephone Encounter (Signed)
Called pt and she is aware that Dr. Maudie Mercury will see her but will no do any control substance pt has decided to stay with Dr. Charlett Blake.

## 2017-09-15 ENCOUNTER — Encounter: Payer: Self-pay | Admitting: Family Medicine

## 2017-09-15 ENCOUNTER — Ambulatory Visit (INDEPENDENT_AMBULATORY_CARE_PROVIDER_SITE_OTHER): Payer: Medicare HMO | Admitting: Family Medicine

## 2017-09-15 VITALS — BP 118/74 | HR 88 | Temp 98.3°F | Ht 63.0 in | Wt 145.6 lb

## 2017-09-15 DIAGNOSIS — F329 Major depressive disorder, single episode, unspecified: Secondary | ICD-10-CM

## 2017-09-15 DIAGNOSIS — Z23 Encounter for immunization: Secondary | ICD-10-CM | POA: Diagnosis not present

## 2017-09-15 DIAGNOSIS — Z86010 Personal history of colonic polyps: Secondary | ICD-10-CM

## 2017-09-15 DIAGNOSIS — E663 Overweight: Secondary | ICD-10-CM

## 2017-09-15 DIAGNOSIS — E8881 Metabolic syndrome: Secondary | ICD-10-CM

## 2017-09-15 DIAGNOSIS — F419 Anxiety disorder, unspecified: Secondary | ICD-10-CM

## 2017-09-15 DIAGNOSIS — M170 Bilateral primary osteoarthritis of knee: Secondary | ICD-10-CM | POA: Diagnosis not present

## 2017-09-15 DIAGNOSIS — E039 Hypothyroidism, unspecified: Secondary | ICD-10-CM | POA: Diagnosis not present

## 2017-09-15 DIAGNOSIS — R69 Illness, unspecified: Secondary | ICD-10-CM | POA: Diagnosis not present

## 2017-09-15 DIAGNOSIS — E88819 Insulin resistance, unspecified: Secondary | ICD-10-CM

## 2017-09-15 DIAGNOSIS — F32A Depression, unspecified: Secondary | ICD-10-CM

## 2017-09-15 DIAGNOSIS — E782 Mixed hyperlipidemia: Secondary | ICD-10-CM | POA: Diagnosis not present

## 2017-09-15 LAB — COMPREHENSIVE METABOLIC PANEL
ALT: 11 U/L (ref 0–35)
AST: 16 U/L (ref 0–37)
Albumin: 4.3 g/dL (ref 3.5–5.2)
Alkaline Phosphatase: 75 U/L (ref 39–117)
BUN: 20 mg/dL (ref 6–23)
CO2: 29 mEq/L (ref 19–32)
Calcium: 9.5 mg/dL (ref 8.4–10.5)
Chloride: 104 mEq/L (ref 96–112)
Creatinine, Ser: 0.73 mg/dL (ref 0.40–1.20)
GFR: 84.2 mL/min (ref >60.00)
Glucose, Bld: 101 mg/dL — ABNORMAL HIGH (ref 70–99)
Potassium: 4.4 mEq/L (ref 3.5–5.1)
Sodium: 141 mEq/L (ref 135–145)
Total Bilirubin: 0.5 mg/dL (ref 0.2–1.2)
Total Protein: 7.1 g/dL (ref 6.0–8.3)

## 2017-09-15 LAB — CBC WITH DIFFERENTIAL/PLATELET
Basophils Absolute: 0 10*3/uL (ref 0.0–0.1)
Basophils Relative: 0.5 % (ref 0.0–3.0)
Eosinophils Absolute: 0.3 10*3/uL (ref 0.0–0.7)
Eosinophils Relative: 4.6 % (ref 0.0–5.0)
HCT: 38.4 % (ref 36.0–46.0)
Hemoglobin: 12.5 g/dL (ref 12.0–15.0)
Lymphocytes Relative: 39.6 % (ref 12.0–46.0)
Lymphs Abs: 2.3 10*3/uL (ref 0.7–4.0)
MCHC: 32.7 g/dL (ref 30.0–36.0)
MCV: 94.5 fl (ref 78.0–100.0)
Monocytes Absolute: 0.4 10*3/uL (ref 0.1–1.0)
Monocytes Relative: 6.6 % (ref 3.0–12.0)
Neutro Abs: 2.8 10*3/uL (ref 1.4–7.7)
Neutrophils Relative %: 48.7 % (ref 43.0–77.0)
Platelets: 298 10*3/uL (ref 150.0–400.0)
RBC: 4.06 Mil/uL (ref 3.87–5.11)
RDW: 13.7 % (ref 11.5–15.5)
WBC: 5.7 10*3/uL (ref 4.0–10.5)

## 2017-09-15 LAB — TSH: TSH: 0.35 u[IU]/mL (ref 0.35–4.50)

## 2017-09-15 LAB — T4, FREE: Free T4: 1.08 ng/dL (ref 0.60–1.60)

## 2017-09-15 MED ORDER — ATORVASTATIN CALCIUM 10 MG PO TABS: 10.0000 mg | ORAL_TABLET | Freq: Every day | ORAL | 3 refills | Status: DC

## 2017-09-15 NOTE — Patient Instructions (Signed)
You may increase magnesium to 400 mg per day.

## 2017-09-15 NOTE — Progress Notes (Signed)
Morgan [name] is a 66 y.o. female is here to [place].   Patient Care Team: Briscoe Deutscher, DO as PCP - General (Family Medicine) Ladene Artist, MD as Consulting Physician (Gastroenterology)   History of Present Illness:   Morgan [name], CMA, acting as scribe for Dr. Juleen China.  HPI:  Patient comes in today to establish care.    Her husband of 45 years died last 26-Sep-2023.  He took his own life.  He had been through a lot of difficulties due to health complications.  He was in excruciating pain everyday, per wife.  Patient was the one that found him after he took his life.  She states that she is coping well with his death.  She tries to keep busy.  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  She states that she understands why her husband did what he did because she saw the type of agony he was in every day of his life.  She has not been through any counseling.  She has been taking Cymbalta since 2005.  She began taking magnesium as well and feels that it helps to calm her.  States she needs refills on her atorvastatin and levothyroxine.  She also takes Cymbalta and acyclovir, but does not need refills on those at this time.  She takes acyclovir for cold sores.    Health Maintenance Due  Topic Date Due  . COLONOSCOPY  11/16/2015   Depression screen PHQ 2/9 09/15/2017  Decreased Interest 0  Down, Depressed, Hopeless 0  PHQ - 2 Score 0  Altered sleeping 0  Tired, decreased energy 0  Change in appetite 0  Feeling bad or failure about yourself  0  Trouble concentrating 0  Moving slowly or fidgety/restless 0  Suicidal thoughts 0  PHQ-9 Score 0   PMHx, SurgHx, SocialHx, Medications, and Allergies were reviewed in the Visit Navigator and updated as appropriate.   Past Medical History:  Diagnosis Date  . Anxiety and depression 02/22/2014  . Arthritis   . Diastolic dysfunction    Negative Nuclear Stress Test 08/09/12.    [name] Elevated cholesterol   . History of colonic polyps   . Hypothyroidism   . Mitral regurgitation   . OSA (obstructive sleep apnea)    Mild. No CPAP.  [name] Osteoarthritis 06/29/2007  . Seasonal allergies   . Tinnitus    Past Surgical History:  Procedure Laterality Date  . POLYPECTOMY     Colon  . REPLACEMENT TOTAL KNEE Right   . TOTAL KNEE ARTHROPLASTY Right 04/22/2007  . TOTAL KNEE ARTHROPLASTY  11/30/2012   Procedure: TOTAL KNEE ARTHROPLASTY;  Surgeon: Gearlean Alf, MD;  Location: [facility];  Service: Orthopedics;  Laterality: Left;   Family History  Problem Relation Age of Onset  . Heart attack Father   . Hyperlipidemia Father   . Heart disease Father   . Diabetes Father   . Brain cancer Father   . Macular degeneration Mother   . Diabetes Brother   . Migraines Daughter   . Pulmonary embolism Daughter   . Diabetes Paternal Grandfather   . Arthritis Other   . Breast cancer Maternal Aunt   . Breast cancer Paternal Aunt    Social History  Substance Use Topics  . Smoking status: Never Smoker  . Smokeless tobacco: Never Used  . Alcohol use Yes     Comment: rare   Current Medications and  Allergies:   Current Outpatient Prescriptions:  .  acyclovir (ZOVIRAX) 400 MG tablet, Take 1 tablet (400 mg total) by mouth 3 (three) times daily. Take only as needed, Disp: 15 tablet, Rfl: 1 .  atorvastatin (LIPITOR) 10 MG tablet, Take 1 tablet (10 mg total) by mouth daily., Disp: 90 tablet, Rfl: 3 .  diazepam (VALIUM) 5 MG tablet, Take 0.5-1 tablets (2.5-5 mg total) by mouth every 8 (eight) hours as needed for anxiety., Disp: 30 tablet, Rfl: 1 .  DULoxetine (CYMBALTA) 30 MG capsule, TAKE 1 CAPSULE BID, Disp: 180 capsule, Rfl: 3 .  fluticasone (FLONASE) 50 MCG/ACT nasal spray, Place 2 sprays into both nostrils daily., Disp: 16 g, Rfl: 6 .  levothyroxine (SYNTHROID, LEVOTHROID) 75 MCG tablet, Take 1 tablet (75 mcg total) by mouth daily., Disp: 30 tablet, Rfl: 1  Allergies  Allergen  Reactions  . Simvastatin Other (See Comments)    Myalgia  . Sulfonamide Derivatives Rash   Review of Systems:   Pertinent items are noted in the HPI. Otherwise, ROS is negative.  Vitals:   Vitals:   09/15/17 1108  BP: 118/74  Pulse: 88  Temp: 98.3 F (36.8 C)  TempSrc: Oral  SpO2: 97%  Weight: 145 lb 9.6 oz (66 kg)  Height: 5\' 3"  (1.6 m)     Body mass index is 25.79 kg/m.   Physical Exam:   Physical Exam  Constitutional: She is oriented to person, place, and time. She appears well-developed and well-nourished. No distress.  HENT:  Head: Normocephalic and atraumatic.  Right Ear: External ear normal.  Left Ear: External ear normal.  Nose: Nose normal.  Mouth/Throat: Oropharynx is clear and moist.  Eyes: Pupils are equal, round, and reactive to light. Conjunctivae and EOM are normal.  Neck: Normal range of motion. Neck supple. No thyromegaly present.  Cardiovascular: Normal rate, regular rhythm, normal heart sounds and intact distal pulses.   Pulmonary/Chest: Effort normal and breath sounds normal.  Abdominal: Soft. Bowel sounds are normal.  Musculoskeletal: Normal range of motion.  Lymphadenopathy:    She has no cervical adenopathy.  Neurological: She is alert and oriented to person, place, and time.  Skin: Skin is warm and dry. Capillary refill takes less than 2 seconds.  Psychiatric: She has a normal mood and affect. Her behavior is normal.  Nursing note and vitals reviewed.  Results for orders placed or performed in visit on 09/15/17  CBC with Differential/Platelet  Result Value Ref Range   WBC 5.7 4.0 - 10.5 K/uL   RBC 4.06 3.87 - 5.11 Mil/uL   Hemoglobin 12.5 12.0 - 15.0 g/dL   HCT 38.4 36.0 - 46.0 %   MCV 94.5 78.0 - 100.0 fl   MCHC 32.7 30.0 - 36.0 g/dL   RDW 13.7 11.5 - 15.5 %   Platelets 298.0 150.0 - 400.0 K/uL   Neutrophils Relative % 48.7 43.0 - 77.0 %   Lymphocytes Relative 39.6 12.0 - 46.0 %   Monocytes Relative 6.6 3.0 - 12.0 %   Eosinophils  Relative 4.6 0.0 - 5.0 %   Basophils Relative 0.5 0.0 - 3.0 %   Neutro Abs 2.8 1.4 - 7.7 K/uL   Lymphs Abs 2.3 0.7 - 4.0 K/uL   Monocytes Absolute 0.4 0.1 - 1.0 K/uL   Eosinophils Absolute 0.3 0.0 - 0.7 K/uL   Basophils Absolute 0.0 0.0 - 0.1 K/uL  Comprehensive metabolic panel  Result Value Ref Range   Sodium 141 135 - 145 mEq/L   Potassium 4.4  3.5 - 5.1 mEq/L   Chloride 104 96 - 112 mEq/L   CO2 29 19 - 32 mEq/L   Glucose, Bld 101 (H) 70 - 99 mg/dL   BUN 20 6 - 23 mg/dL   Creatinine, Ser 0.73 0.40 - 1.20 mg/dL   Total Bilirubin 0.5 0.2 - 1.2 mg/dL   Alkaline Phosphatase 75 39 - 117 U/L   AST 16 0 - 37 U/L   ALT 11 0 - 35 U/L   Total Protein 7.1 6.0 - 8.3 g/dL   Albumin 4.3 3.5 - 5.2 g/dL   Calcium 9.5 8.4 - 10.5 mg/dL   GFR 84.20 >60.00 mL/min  TSH  Result Value Ref Range   TSH 0.35 0.35 - 4.50 uIU/mL  T4, free  Result Value Ref Range   Free T4 1.08 0.60 - 1.60 ng/dL   Assessment and Plan:   Pierra was seen today for establish care and medication refill.  Diagnoses and all orders for this visit:  Anxiety and depression Comments: Doing well. Continue current treatment. Offered therapy services if needed at any time.  Orders: -     CBC with Differential/Platelet  Hypothyroidism, unspecified type Comments: Continue current treatment.   Lab Results  Component Value Date   TSH 0.35 09/15/2017   Orders: -     TSH -     T4, free  Overweight Comments: The patient is exercising and making healthy food choices. Will consider Metformin if weight and labs worsen.   Encounter for immunization -     Flu vaccine HIGH DOSE PF  Primary osteoarthritis of both knees Comments: Doing well.  History of colon polyps Comments: Due for colonoscopy.   Mixed hyperlipidemia Comments:  Lab Results  Component Value Date   CHOL 182 04/15/2017   HDL 59.00 04/15/2017   LDLCALC 101 (H) 04/15/2017   LDLDIRECT 152.7 02/01/2013   TRIG 110.0 04/15/2017   CHOLHDL 3 04/15/2017     Orders: -     atorvastatin (LIPITOR) 10 MG tablet; Take 1 tablet (10 mg total) by mouth daily. -     CBC with Differential/Platelet -     Comprehensive metabolic panel  Insulin resistance Comments: Will check A1c at next visit.    . Reviewed expectations re: course of current medical issues. . Discussed self-management of symptoms. . Outlined signs and symptoms indicating need for more acute intervention. . Patient verbalized understanding and all questions were answered. [name] Health Maintenance issues including appropriate healthy diet, exercise, and smoking avoidance were discussed with patient. . See orders for this visit as documented in the electronic medical record. . Patient received an After Visit Summary.  Records requested if needed. Time spent with the patient: 40 minutes, of which >50% was spent in obtaining information about her symptoms, reviewing her previous labs, evaluations, and treatments, counseling her about her condition (please see the discussed topics above), and developing a plan to further investigate it; she had a number of questions which I addressed.   CMA served as Education administrator during this visit. History, Physical, and Plan performed by medical provider. The above documentation has been reviewed and is accurate and complete. Briscoe Deutscher, D.O.  Briscoe Deutscher, DO [place] [place] 09/18/2017  Future Appointments Date Time Provider [place]  10/02/2017 2:00 PM [facility] PREVISIT RM [facility]  10/16/2017 9:00 AM Ladene Artist, MD [facility]  03/16/2018 1:00 PM Briscoe Deutscher, DO [facility] None

## 2017-09-18 ENCOUNTER — Encounter: Payer: Self-pay | Admitting: Family Medicine

## 2017-09-19 ENCOUNTER — Other Ambulatory Visit: Payer: Self-pay | Admitting: *Deleted

## 2017-09-19 ENCOUNTER — Telehealth: Payer: Self-pay | Admitting: Family Medicine

## 2017-09-19 DIAGNOSIS — Z1211 Encounter for screening for malignant neoplasm of colon: Secondary | ICD-10-CM

## 2017-09-19 NOTE — Telephone Encounter (Signed)
Spoke with patient and gave results.  Please see result note.

## 2017-09-19 NOTE — Telephone Encounter (Signed)
Patient returning missed phone call. Please call patient and advise.  °

## 2017-09-30 ENCOUNTER — Telehealth: Payer: Self-pay | Admitting: Family Medicine

## 2017-09-30 ENCOUNTER — Other Ambulatory Visit: Payer: Self-pay

## 2017-09-30 MED ORDER — LEVOTHYROXINE SODIUM 75 MCG PO TABS: 75.0000 ug | ORAL_TABLET | Freq: Every day | ORAL | 1 refills | Status: DC

## 2017-09-30 NOTE — Telephone Encounter (Signed)
MEDICATION: levothyroxine (SYNTHROID, LEVOTHROID) 75 MCG tablet  PHARMACY:  [place], [place] - [place]  IS THIS A 90 DAY SUPPLY : yes  IS PATIENT OUT OF MEDICATION: yes  IF NOT; HOW MUCH IS LEFT: n/a  LAST APPOINTMENT DATE: @9 /17/2018  NEXT APPOINTMENT DATE:@3 /18/2019  OTHER COMMENTS:    **Let patient know to contact pharmacy at the end of the day to make sure medication is ready. **  ** Please notify patient to allow 48-72 hours to process**  **Encourage patient to contact the pharmacy for refills or they can request refills through [facility]**

## 2017-09-30 NOTE — Telephone Encounter (Signed)
Refill sent.

## 2017-10-02 ENCOUNTER — Ambulatory Visit (AMBULATORY_SURGERY_CENTER): Payer: Self-pay | Admitting: *Deleted

## 2017-10-02 ENCOUNTER — Encounter: Payer: Self-pay | Admitting: Gastroenterology

## 2017-10-02 VITALS — Ht 63.0 in | Wt 147.2 lb

## 2017-10-02 DIAGNOSIS — Z86010 Personal history of colonic polyps: Secondary | ICD-10-CM

## 2017-10-02 MED ORDER — NA SULFATE-K SULFATE-MG SULF 17.5-3.13-1.6 GM/177ML PO SOLN: 1.0000 [IU] | Freq: Once | ORAL | 0 refills | Status: AC

## 2017-10-02 NOTE — Progress Notes (Signed)
No egg or soy allergy known to patient  No issues with past sedation with any surgeries  or procedures, no intubation problems  No diet pills per patient No home 02 use per patient  No blood thinners per patient  Pt denies issues with constipation  No A fib or A flutter  EMMI video sent to pt's e mail pt. Declined  

## 2017-10-08 ENCOUNTER — Telehealth: Payer: Self-pay | Admitting: Gastroenterology

## 2017-10-08 NOTE — Telephone Encounter (Signed)
Returned call to patient. Prep $98.00 with insurance. Too expensive, requested something less expensive. Called pharmacy [facility] [address] and asked to run Pay No More than $50.00 coupon as cash. It was accepted. Notified patient.

## 2017-10-13 DIAGNOSIS — L814 Other melanin hyperpigmentation: Secondary | ICD-10-CM | POA: Diagnosis not present

## 2017-10-13 DIAGNOSIS — L820 Inflamed seborrheic keratosis: Secondary | ICD-10-CM | POA: Diagnosis not present

## 2017-10-13 DIAGNOSIS — L821 Other seborrheic keratosis: Secondary | ICD-10-CM | POA: Diagnosis not present

## 2017-10-13 DIAGNOSIS — L111 Transient acantholytic dermatosis [Grover]: Secondary | ICD-10-CM | POA: Diagnosis not present

## 2017-10-13 DIAGNOSIS — D1801 Hemangioma of skin and subcutaneous tissue: Secondary | ICD-10-CM | POA: Diagnosis not present

## 2017-10-13 DIAGNOSIS — D485 Neoplasm of uncertain behavior of skin: Secondary | ICD-10-CM | POA: Diagnosis not present

## 2017-10-16 ENCOUNTER — Encounter: Payer: Self-pay | Admitting: Gastroenterology

## 2017-10-16 ENCOUNTER — Ambulatory Visit (AMBULATORY_SURGERY_CENTER): Payer: Medicare HMO | Admitting: Gastroenterology

## 2017-10-16 VITALS — BP 152/76 | HR 65 | Temp 97.5°F | Resp 12 | Ht 63.0 in | Wt 147.0 lb

## 2017-10-16 DIAGNOSIS — Z86010 Personal history of colonic polyps: Secondary | ICD-10-CM | POA: Diagnosis present

## 2017-10-16 DIAGNOSIS — D125 Benign neoplasm of sigmoid colon: Secondary | ICD-10-CM | POA: Diagnosis not present

## 2017-10-16 DIAGNOSIS — K635 Polyp of colon: Secondary | ICD-10-CM | POA: Diagnosis not present

## 2017-10-16 DIAGNOSIS — Z1211 Encounter for screening for malignant neoplasm of colon: Secondary | ICD-10-CM | POA: Diagnosis not present

## 2017-10-16 MED ORDER — SODIUM CHLORIDE 0.9 % IV SOLN: 500.0000 mL | INTRAVENOUS | Status: DC

## 2017-10-16 NOTE — Progress Notes (Signed)
Called to room to assist during endoscopic procedure.  Patient ID and intended procedure confirmed with present staff. Received instructions for my participation in the procedure from the performing physician.  

## 2017-10-16 NOTE — Patient Instructions (Signed)
Handouts given : Polyps, Diverticulosis and Hemorrhoids.   YOU HAD AN ENDOSCOPIC PROCEDURE TODAY AT THE  ENDOSCOPY CENTER:   Refer to the procedure report that was given to you for any specific questions about what was found during the examination.  If the procedure report does not answer your questions, please call your gastroenterologist to clarify.  If you requested that your care partner not be given the details of your procedure findings, then the procedure report has been included in a sealed envelope for you to review at your convenience later.  YOU SHOULD EXPECT: Some feelings of bloating in the abdomen. Passage of more gas than usual.  Walking can help get rid of the air that was put into your GI tract during the procedure and reduce the bloating. If you had a lower endoscopy (such as a colonoscopy or flexible sigmoidoscopy) you may notice spotting of blood in your stool or on the toilet paper. If you underwent a bowel prep for your procedure, you may not have a normal bowel movement for a few days.  Please Note:  You might notice some irritation and congestion in your nose or some drainage.  This is from the oxygen used during your procedure.  There is no need for concern and it should clear up in a day or so.  SYMPTOMS TO REPORT IMMEDIATELY:   Following lower endoscopy (colonoscopy or flexible sigmoidoscopy):  Excessive amounts of blood in the stool  Significant tenderness or worsening of abdominal pains  Swelling of the abdomen that is new, acute  Fever of 100F or higher   For urgent or emergent issues, a gastroenterologist can be reached at any hour by calling [phone].   DIET:  We do recommend a small meal at first, but then you may proceed to your regular diet.  Drink plenty of fluids but you should avoid alcoholic beverages for 24 hours.  ACTIVITY:  You should plan to take it easy for the rest of today and you should NOT DRIVE or use heavy machinery until  tomorrow (because of the sedation medicines used during the test).    FOLLOW UP: Our staff will call the number listed on your records the next business day following your procedure to check on you and address any questions or concerns that you may have regarding the information given to you following your procedure. If we do not reach you, we will leave a message.  However, if you are feeling well and you are not experiencing any problems, there is no need to return our call.  We will assume that you have returned to your regular daily activities without incident.  If any biopsies were taken you will be contacted by phone or by letter within the next 1-3 weeks.  Please call us at [phone] if you have not heard about the biopsies in 3 weeks.    SIGNATURES/CONFIDENTIALITY: You and/or your care partner have signed paperwork which will be entered into your electronic medical record.  These signatures attest to the fact that that the information above on your After Visit Summary has been reviewed and is understood.  Full responsibility of the confidentiality of this discharge information lies with you and/or your care-partner. 

## 2017-10-16 NOTE — Op Note (Signed)
[place] Patient Name: Morgan [name] Procedure Date: 10/16/2017 9:08 AM MRN: [id number] Endoscopist: Ladene Artist , MD Age: 66 Referring MD:  Date of Birth: Aug 05, 1951 Gender: Female Account #: [id number] Procedure:                Colonoscopy Indications:              Surveillance: Personal history of adenomatous                            polyps on last colonoscopy > 5 years ago Medicines:                Monitored Anesthesia Care Procedure:                Pre-Anesthesia Assessment:                           - Prior to the procedure, a History and Physical                            was performed, and patient medications and                            allergies were reviewed. The patient's tolerance of                            previous anesthesia was also reviewed. The risks                            and benefits of the procedure and the sedation                            options and risks were discussed with the patient.                            All questions were answered, and informed consent                            was obtained. Prior Anticoagulants: The patient has                            taken no previous anticoagulant or antiplatelet                            agents. ASA Grade Assessment: II - A patient with                            mild systemic disease. After reviewing the risks                            and benefits, the patient was deemed in                            satisfactory condition to undergo the procedure.  After obtaining informed consent, the colonoscope                            was passed under direct vision. Throughout the                            procedure, the patient's blood pressure, pulse, and                            oxygen saturations were monitored continuously. The                            Colonoscope was introduced through the anus and                            advanced to the the  cecum, identified by                            appendiceal orifice and ileocecal valve. The                            ileocecal valve, appendiceal orifice, and rectum                            were photographed. The quality of the bowel                            preparation was good. The colonoscopy was performed                            without difficulty. The patient tolerated the                            procedure well. Scope In: 9:15:30 AM Scope Out: 9:30:58 AM Scope Withdrawal Time: 0 hours 9 minutes 53 seconds  Total Procedure Duration: 0 hours 15 minutes 28 seconds  Findings:                 The perianal and digital rectal examinations were                            normal.                           A 6 mm polyp was found in the sigmoid colon. The                            polyp was sessile. The polyp was removed with a                            cold snare. Resection and retrieval were complete.                           Internal hemorrhoids were found during  retroflexion. The hemorrhoids were small and Grade                            I (internal hemorrhoids that do not prolapse).                           A few small-mouthed diverticula were found in the                            sigmoid colon.                           The exam was otherwise without abnormality on                            direct and retroflexion views. Complications:            No immediate complications. Estimated blood loss:                            None. Estimated Blood Loss:     Estimated blood loss: none. Impression:               - One 6 mm polyp in the sigmoid colon, removed with                            a cold snare. Resected and retrieved.                           - Internal hemorrhoids.                           - Diverticulosis in the sigmoid colon.                           - The examination was otherwise normal on direct                            and  retroflexion views. Recommendation:           - Repeat colonoscopy in 5 years for surveillance.                           - Patient has a contact number available for                            emergencies. The signs and symptoms of potential                            delayed complications were discussed with the                            patient. Return to normal activities tomorrow.                            Written discharge instructions were provided to the  patient.                           - Resume previous diet.                           - Continue present medications.                           - Await pathology results. Ladene Artist, MD 10/16/2017 9:34:33 AM This report has been signed electronically.

## 2017-10-16 NOTE — Progress Notes (Signed)
Pt. Reports no change in her medical or surgical history since pre-visit 10/02/2017.

## 2017-10-16 NOTE — Progress Notes (Signed)
To PACU, VSS. Report to [email or web address] 

## 2017-10-17 ENCOUNTER — Telehealth: Payer: Self-pay

## 2017-10-17 NOTE — Telephone Encounter (Signed)
  Follow up Call-  Call Fatim Vanderschaaf number 10/16/2017  Post procedure Call Kayen Grabel phone  # [phone]  Permission to leave phone message Yes  Some recent data might be hidden     Patient questions:  Do you have a fever, pain , or abdominal swelling? No. Pain Score  0 *  Have you tolerated food without any problems? Yes.    Have you been able to return to your normal activities? Yes.    Do you have any questions about your discharge instructions: Diet   No. Medications  No. Follow up visit  No.  Do you have questions or concerns about your Care? No.  Actions: * If pain score is 4 or above: No action needed, pain <4.

## 2017-10-24 ENCOUNTER — Encounter: Payer: Self-pay | Admitting: Gastroenterology

## 2018-01-05 ENCOUNTER — Telehealth: Payer: Self-pay | Admitting: Family Medicine

## 2018-01-05 NOTE — Telephone Encounter (Signed)
Attempted to Call pt. To discuss request for Duloxetine dose to be changed.  Left vm to return call.

## 2018-01-05 NOTE — Telephone Encounter (Signed)
Copied from [place] [id number]. Topic: Quick Communication - See Telephone Encounter >> Jan 05, 2018 12:48 PM Corie Chiquito, [place] wrote: CRM for notification. See Telephone encounter for: Patient calling because she needs a refill on her Duloxetine. Also she would like the prescription to be  for 1 pill a day to take. If someone could give her a call at [phone]  01/05/18.

## 2018-01-06 ENCOUNTER — Telehealth: Payer: Self-pay | Admitting: Family Medicine

## 2018-01-06 DIAGNOSIS — F329 Major depressive disorder, single episode, unspecified: Secondary | ICD-10-CM

## 2018-01-06 DIAGNOSIS — F32A Depression, unspecified: Secondary | ICD-10-CM

## 2018-01-06 DIAGNOSIS — F419 Anxiety disorder, unspecified: Secondary | ICD-10-CM

## 2018-01-06 NOTE — Telephone Encounter (Signed)
Called in requesting her Cymbalta be ordered as a once a day pill instead of a 2 a day pill.   She doesn't feel she needs the 2 pill a day dose.    Please send it to the [facility] pharmacy.   She will be out in about a week.   The dose is 30 mg.  I  Routed a note to Dr. Alcario Drought nurse pool making them aware of her request.

## 2018-01-06 NOTE — Telephone Encounter (Signed)
Please advise on the request below regarding a medication issue.

## 2018-01-06 NOTE — Telephone Encounter (Signed)
Okay refill 30 mg po daily.

## 2018-01-07 MED ORDER — DULOXETINE HCL 30 MG PO CPEP: ORAL_CAPSULE | ORAL | 2 refills | Status: DC

## 2018-01-07 NOTE — Addendum Note (Signed)
Addended by: Durwin Glaze on: 01/07/2018 08:32 AM   Modules accepted: Orders

## 2018-01-07 NOTE — Telephone Encounter (Signed)
New Prescription sent to the pharmacy.

## 2018-02-05 DIAGNOSIS — J0140 Acute pansinusitis, unspecified: Secondary | ICD-10-CM | POA: Diagnosis not present

## 2018-02-26 DIAGNOSIS — R69 Illness, unspecified: Secondary | ICD-10-CM | POA: Diagnosis not present

## 2018-03-16 ENCOUNTER — Ambulatory Visit (INDEPENDENT_AMBULATORY_CARE_PROVIDER_SITE_OTHER): Payer: Medicare HMO | Admitting: Family Medicine

## 2018-03-16 ENCOUNTER — Encounter: Payer: Self-pay | Admitting: Family Medicine

## 2018-03-16 VITALS — BP 112/76 | HR 75 | Temp 97.8°F | Ht 63.0 in | Wt 146.6 lb

## 2018-03-16 DIAGNOSIS — F5102 Adjustment insomnia: Secondary | ICD-10-CM | POA: Diagnosis not present

## 2018-03-16 DIAGNOSIS — E039 Hypothyroidism, unspecified: Secondary | ICD-10-CM | POA: Diagnosis not present

## 2018-03-16 DIAGNOSIS — F419 Anxiety disorder, unspecified: Secondary | ICD-10-CM | POA: Diagnosis not present

## 2018-03-16 DIAGNOSIS — R42 Dizziness and giddiness: Secondary | ICD-10-CM | POA: Diagnosis not present

## 2018-03-16 DIAGNOSIS — F329 Major depressive disorder, single episode, unspecified: Secondary | ICD-10-CM

## 2018-03-16 DIAGNOSIS — R69 Illness, unspecified: Secondary | ICD-10-CM | POA: Diagnosis not present

## 2018-03-16 DIAGNOSIS — R5383 Other fatigue: Secondary | ICD-10-CM

## 2018-03-16 DIAGNOSIS — R5381 Other malaise: Secondary | ICD-10-CM

## 2018-03-16 DIAGNOSIS — F32A Depression, unspecified: Secondary | ICD-10-CM

## 2018-03-16 LAB — COMPREHENSIVE METABOLIC PANEL
ALT: 14 U/L (ref 0–35)
AST: 18 U/L (ref 0–37)
Albumin: 4.3 g/dL (ref 3.5–5.2)
Alkaline Phosphatase: 82 U/L (ref 39–117)
BUN: 21 mg/dL (ref 6–23)
CO2: 29 mEq/L (ref 19–32)
Calcium: 9.5 mg/dL (ref 8.4–10.5)
Chloride: 104 mEq/L (ref 96–112)
Creatinine, Ser: 0.75 mg/dL (ref 0.40–1.20)
GFR: 81.5 mL/min (ref >60.00)
Glucose, Bld: 103 mg/dL — ABNORMAL HIGH (ref 70–99)
Potassium: 4.1 mEq/L (ref 3.5–5.1)
Sodium: 142 mEq/L (ref 135–145)
Total Bilirubin: 0.5 mg/dL (ref 0.2–1.2)
Total Protein: 7.1 g/dL (ref 6.0–8.3)

## 2018-03-16 LAB — TSH: TSH: 0.56 u[IU]/mL (ref 0.35–4.50)

## 2018-03-16 LAB — HEMOGLOBIN A1C: Hgb A1c MFr Bld: 5.9 % (ref 4.6–6.5)

## 2018-03-16 LAB — CBC
HCT: 37.3 % (ref 36.0–46.0)
Hemoglobin: 12.3 g/dL (ref 12.0–15.0)
MCHC: 33 g/dL (ref 30.0–36.0)
MCV: 93.7 fl (ref 78.0–100.0)
Platelets: 312 10*3/uL (ref 150.0–400.0)
RBC: 3.98 Mil/uL (ref 3.87–5.11)
RDW: 13.8 % (ref 11.5–15.5)
WBC: 5.8 10*3/uL (ref 4.0–10.5)

## 2018-03-16 LAB — VITAMIN B12: Vitamin B-12: 559 pg/mL (ref 211–911)

## 2018-03-16 LAB — T4, FREE: Free T4: 0.89 ng/dL (ref 0.60–1.60)

## 2018-03-16 MED ORDER — DIAZEPAM 5 MG PO TABS: 2.5000 mg | ORAL_TABLET | Freq: Three times a day (TID) | ORAL | 1 refills | Status: DC | PRN

## 2018-03-16 NOTE — Progress Notes (Signed)
Morgan [name] is a 67 y.o. female is here for follow up.  History of Present Illness:   Shaune Pascal CMA acting as scribe for Dr. Juleen China.  HPI: Patient comes in today for follow up on her depression. She has been feeling fatigue here lately. PHQ 9 done today.   Review of Systems  Constitutional: Positive for malaise/fatigue. Negative for chills and fever.  HENT: Negative for congestion and ear pain.   Eyes: Negative for blurred vision and double vision.  Respiratory: Negative for cough and shortness of breath.   Cardiovascular: Negative for chest pain and palpitations.  Gastrointestinal: Negative for abdominal pain and nausea.  Genitourinary: Negative for dysuria.  Musculoskeletal: Negative for back pain and neck pain.  Neurological: Negative for dizziness and headaches.  Psychiatric/Behavioral: Positive for depression. Negative for substance abuse and suicidal ideas.   There are no preventive care reminders to display for this patient.   Depression screen [facility] 2/9 09/15/2017 09/15/2017 07/22/2016  Decreased Interest 0 0 0  Down, Depressed, Hopeless 0 0 0  PHQ - 2 Score 0 0 0  Altered sleeping 0 - -  Tired, decreased energy 0 - -  Change in appetite 0 - -  Feeling bad or failure about yourself  0 - -  Trouble concentrating 0 - -  Moving slowly or fidgety/restless 0 - -  Suicidal thoughts 0 - -  PHQ-9 Score 0 - -   PMHx, SurgHx, SocialHx, FamHx, Medications, and Allergies were reviewed in the Visit Navigator and updated as appropriate.   Patient Active Problem List   Diagnosis Date Noted  . Anxiety and depression 02/22/2014  . Mild mitral regurgitation by prior echocardiogram 07/24/2010  . OSA (obstructive sleep apnea) 07/04/2010  . Diastolic dysfunction 41/32/4401  . Overweight 06/22/2009  . History of colon polyps 01/18/2008  . Hyperlipidemia 07/14/2007  . Hypothyroidism 06/29/2007  . Allergic rhinitis 06/29/2007  . Osteoarthritis 06/29/2007   Social  History   Tobacco Use  . Smoking status: Never Smoker  . Smokeless tobacco: Never Used  Substance Use Topics  . Alcohol use: Yes    Comment: rare  . Drug use: No   Current Medications and Allergies:   .  acyclovir (ZOVIRAX) 400 MG tablet, Take 1 tablet (400 mg total) by mouth 3 (three) times daily. Take only as needed, Disp: 15 tablet, Rfl: 1 .  atorvastatin (LIPITOR) 10 MG tablet, Take 1 tablet (10 mg total) by mouth daily., Disp: 90 tablet, Rfl: 3 .  diazepam (VALIUM) 5 MG tablet, Take 0.5-1 tablets (2.5-5 mg total) by mouth every 8 (eight) hours as needed for anxiety., Disp: 30 tablet, Rfl: 1 .  DULoxetine (CYMBALTA) 30 MG capsule, TAKE 1 CAPSULE DAILY., Disp: 90 capsule, Rfl: 2 .  fluticasone (FLONASE) 50 MCG/ACT nasal spray, Place 2 sprays into both nostrils daily., Disp: 16 g, Rfl: 6 .  levothyroxine (SYNTHROID, LEVOTHROID) 75 MCG tablet, Take 1 tablet (75 mcg total) by mouth daily., Disp: 90 tablet, Rfl: 1  Allergies  Allergen Reactions  . Simvastatin Other (See Comments)    Myalgia  . Sulfonamide Derivatives Rash   Review of Systems   Pertinent items are noted in the HPI. Otherwise, ROS is negative.  Vitals:   Vitals:   03/16/18 1249  BP: 112/76  Pulse: 75  Temp: 97.8 F (36.6 C)  TempSrc: Oral  SpO2: 96%  Weight: 146 lb 9.6 oz (66.5 kg)  Height: 5\' 3"  (1.6 m)     Body mass index is  25.97 kg/m.  Physical Exam:   Physical Exam  Constitutional: She is oriented to person, place, and time. She appears well-developed and well-nourished. No distress.  HENT:  Head: Normocephalic and atraumatic.  Right Ear: External ear normal.  Left Ear: External ear normal.  Nose: Nose normal.  Mouth/Throat: Oropharynx is clear and moist.  Eyes: Conjunctivae and EOM are normal. Pupils are equal, round, and reactive to light.  Neck: Normal range of motion. Neck supple.  Cardiovascular: Normal rate, regular rhythm and intact distal pulses.  Pulmonary/Chest: Effort normal and  breath sounds normal.  Abdominal: Soft. Bowel sounds are normal.  Musculoskeletal: Normal range of motion.  Neurological: She is alert and oriented to person, place, and time.  Skin: Skin is warm.  Psychiatric: She has a normal mood and affect. Her behavior is normal.  Nursing note and vitals reviewed.   Assessment and Plan:   1. Anxiety and depression Well controlled.  No signs of complications, medication side effects, or red flags.  Continue current regimen.    - diazepam (VALIUM) 5 MG tablet; Take 0.5-1 tablets (2.5-5 mg total) by mouth every 8 (eight) hours as needed for anxiety.  Dispense: 30 tablet; Refill: 1  2. Malaise and fatigue - CBC - Comprehensive metabolic panel - Vitamin X52 - TSH - T4, free - Hemoglobin A1c  3. Acquired hypothyroidism Patient with long-standing hypothyroidism, on levothyroxine therapy. We discussed about correct intake of levothyroxine, fasting, with water, separated by at least 30 minutes from breakfast, and separated by more than 4 hours from calcium, iron, multivitamins, acid reflux medications (PPIs). Will check thyroid tests today: TSH, free T4.  If labs today are abnormal, she will need to return in ~6 weeks for repeat labs. Otherwise, I will see her back in 3-6 months.  - TSH - T4, free  4. Dizziness Vertigo Patient presents for evaluation of dizziness. The symptoms started several weeks ago and have stabilized. The attacks occur infrequently and last a few minutes. Positions that worsen symptoms: turning head. Previous workup/treatments: none. Associated ear symptoms: none. Associated CNS symptoms: none. Recent infections: none. Head trauma: denied. Drug ingestion: none.   - CBC - Comprehensive metabolic panel - Vitamin W41 - TSH - T4, free - Hemoglobin A1c  5. Adjustment insomnia Well controlled.  No signs of complications, medication side effects, or red flags.  Continue current regimen.    - diazepam (VALIUM) 5 MG tablet; Take  0.5-1 tablets (2.5-5 mg total) by mouth every 8 (eight) hours as needed for anxiety.  Dispense: 30 tablet; Refill: 1  . Reviewed expectations re: course of current medical issues. . Discussed self-management of symptoms. . Outlined signs and symptoms indicating need for more acute intervention. . Patient verbalized understanding and all questions were answered. [name] Health Maintenance issues including appropriate healthy diet, exercise, and smoking avoidance were discussed with patient. . See orders for this visit as documented in the electronic medical record. . Patient received an After Visit Summary.  CMA served as Education administrator during this visit. History, Physical, and Plan performed by medical provider. The above documentation has been reviewed and is accurate and complete. Briscoe Deutscher, D.O.  Briscoe Deutscher, DO [place], [facility] 03/16/2018

## 2018-03-31 ENCOUNTER — Other Ambulatory Visit: Payer: Self-pay | Admitting: Family Medicine

## 2018-03-31 ENCOUNTER — Encounter: Payer: Self-pay | Admitting: Family Medicine

## 2018-04-01 ENCOUNTER — Other Ambulatory Visit: Payer: Self-pay | Admitting: Surgical

## 2018-04-01 DIAGNOSIS — R42 Dizziness and giddiness: Secondary | ICD-10-CM

## 2018-04-08 ENCOUNTER — Encounter: Payer: Self-pay | Admitting: Family Medicine

## 2018-06-16 ENCOUNTER — Ambulatory Visit: Payer: Medicare HMO | Admitting: Family Medicine

## 2018-07-06 ENCOUNTER — Telehealth: Payer: Self-pay | Admitting: Family Medicine

## 2018-07-06 DIAGNOSIS — B001 Herpesviral vesicular dermatitis: Secondary | ICD-10-CM

## 2018-07-06 MED ORDER — ACYCLOVIR 400 MG PO TABS: 400.0000 mg | ORAL_TABLET | Freq: Three times a day (TID) | ORAL | 6 refills | Status: DC

## 2018-07-06 NOTE — Telephone Encounter (Signed)
Copied from [place] [id number]. Topic: Quick Communication - Rx Refill/Question >> Jul 06, 2018  3:11 PM Margot Ables wrote: Medication: acyclovir (ZOVIRAX) 400 MG tablet  - pt states she requested last week thru mychart - pt has cold sores now and needing refill Has the patient contacted their pharmacy? Yes - expired - told to contact doctor Preferred Pharmacy (with phone number or street name): [facility] [place] [address] [place] [phone] (Phone) [phone] (Fax)

## 2018-07-06 NOTE — Telephone Encounter (Signed)
Please advise on refill. I do not see in chart a refill request for this.

## 2018-07-06 NOTE — Telephone Encounter (Signed)
Left message on patients phone that prescription has been sent to the pharmacy.

## 2018-07-06 NOTE — Telephone Encounter (Signed)
Sent in. Let patient know.

## 2018-07-06 NOTE — Telephone Encounter (Signed)
See note

## 2018-08-21 DIAGNOSIS — H52223 Regular astigmatism, bilateral: Secondary | ICD-10-CM | POA: Diagnosis not present

## 2018-08-21 DIAGNOSIS — H524 Presbyopia: Secondary | ICD-10-CM | POA: Diagnosis not present

## 2018-08-21 DIAGNOSIS — H5203 Hypermetropia, bilateral: Secondary | ICD-10-CM | POA: Diagnosis not present

## 2018-08-27 DIAGNOSIS — R69 Illness, unspecified: Secondary | ICD-10-CM | POA: Diagnosis not present

## 2018-09-04 DIAGNOSIS — N3001 Acute cystitis with hematuria: Secondary | ICD-10-CM | POA: Diagnosis not present

## 2018-09-04 DIAGNOSIS — R300 Dysuria: Secondary | ICD-10-CM | POA: Diagnosis not present

## 2018-09-11 ENCOUNTER — Other Ambulatory Visit: Payer: Self-pay | Admitting: Family Medicine

## 2018-09-11 DIAGNOSIS — Z1231 Encounter for screening mammogram for malignant neoplasm of breast: Secondary | ICD-10-CM

## 2018-09-13 DIAGNOSIS — E8881 Metabolic syndrome: Secondary | ICD-10-CM | POA: Insufficient documentation

## 2018-09-13 DIAGNOSIS — E88819 Insulin resistance, unspecified: Secondary | ICD-10-CM | POA: Insufficient documentation

## 2018-09-13 NOTE — Progress Notes (Signed)
Morgan [name] is a 67 y.o. female is here for follow up.  History of Present Illness:   Morgan [name], CMA acting as scribe for Dr. Briscoe Deutscher.   HPI: 6 month follow up.   Cold Sore: She has noticed increase in cold sores. She takes acyclovir when she gets them but in the past she has only had one a year but has increased to 3-5 times in the last 6 months.   Hot flashes: she has not had issue with in very long time. She went through menopause in her 60's. She was on hormone replacement until her 44's that helped with them. She has not had issue with them until recently. She denies any dizziness or other symptoms when she has them.   Depression. She has been on Cymbalta for about 13 years. In the last 6 months or so she has noticed increased mood changes. She does not feel like the Cymbalta is working as well. She denies any suicidal thoughts just increased depression.    Health Maintenance Due  Topic Date Due  . INFLUENZA VACCINE  07/30/2018   Depression screen [facility] 2/9 09/14/2018 03/16/2018 09/15/2017  Decreased Interest 1 0 0  Down, Depressed, Hopeless 1 1 0  PHQ - 2 Score 2 1 0  Altered sleeping 0 1 0  Tired, decreased energy 3 2 0  Change in appetite 1 0 0  Feeling bad or failure about yourself  1 0 0  Trouble concentrating 1 1 0  Moving slowly or fidgety/restless 0 0 0  Suicidal thoughts 0 0 0  PHQ-9 Score 8 5 0  Difficult doing work/chores Somewhat difficult Not difficult at all -    PMHx, SurgHx, SocialHx, FamHx, Medications, and Allergies were reviewed in the Visit Navigator and updated as appropriate.   Patient Active Problem List   Diagnosis Date Noted  . Insulin resistance 09/13/2018  . Anxiety and depression 02/22/2014  . Mild mitral regurgitation by prior echocardiogram 07/24/2010  . OSA (obstructive sleep apnea) 07/04/2010  . Diastolic dysfunction 17/00/1749  . Overweight 06/22/2009  . History of colon polyps 01/18/2008  . Hyperlipidemia  07/14/2007  . Hypothyroidism 06/29/2007  . Allergic rhinitis 06/29/2007  . Osteoarthritis 06/29/2007   Social History   Tobacco Use  . Smoking status: Never Smoker  . Smokeless tobacco: Never Used  Substance Use Topics  . Alcohol use: Yes    Comment: rare  . Drug use: No   Current Medications and Allergies:   Current Outpatient Medications:  .  acyclovir (ZOVIRAX) 400 MG tablet, Take 1 tablet (400 mg total) by mouth 3 (three) times daily. Take only as needed, Disp: 15 tablet, Rfl: 6 .  atorvastatin (LIPITOR) 10 MG tablet, Take 1 tablet (10 mg total) by mouth daily., Disp: 90 tablet, Rfl: 3 .  diazepam (VALIUM) 5 MG tablet, Take 0.5-1 tablets (2.5-5 mg total) by mouth every 8 (eight) hours as needed for anxiety., Disp: 30 tablet, Rfl: 1 .  DULoxetine (CYMBALTA) 30 MG capsule, TAKE 1 CAPSULE DAILY., Disp: 90 capsule, Rfl: 2 .  fluticasone (FLONASE) 50 MCG/ACT nasal spray, Place 2 sprays into both nostrils daily., Disp: 16 g, Rfl: 6 .  levothyroxine (SYNTHROID, LEVOTHROID) 75 MCG tablet, TAKE 1 TABLET DAILY, Disp: 90 tablet, Rfl: 1   Allergies  Allergen Reactions  . Simvastatin Other (See Comments)    Myalgia  . Sulfonamide Derivatives Rash   Review of Systems   Pertinent items are noted in the HPI. Otherwise, ROS is  negative.  Vitals:   Vitals:   09/14/18 1008  BP: 118/72  Pulse: 75  Temp: 97.7 F (36.5 C)  TempSrc: Oral  SpO2: 99%  Weight: 147 lb 6.4 oz (66.9 kg)  Height: 5\' 3"  (1.6 m)     Body mass index is 26.11 kg/m.  Physical Exam:   Physical Exam  Constitutional: She appears well-nourished.  HENT:  Head: Normocephalic and atraumatic.  Eyes: Pupils are equal, round, and reactive to light. EOM are normal.  Neck: Normal range of motion. Neck supple.  Cardiovascular: Normal rate, regular rhythm, normal heart sounds and intact distal pulses.  Pulmonary/Chest: Effort normal.  Abdominal: Soft.  Skin: Skin is warm.  Psychiatric: She has a normal mood and  affect. Her behavior is normal.  Nursing note and vitals reviewed.  Assessment and Plan:   Morgan [name] was seen today for follow-up.  Diagnoses and all orders for this visit:  Acquired hypothyroidism -     TSH -     T4, free  Anxiety and depression -     buPROPion (WELLBUTRIN XL) 150 MG 24 hr tablet; Take 1 tablet (150 mg total) by mouth daily.  Pure hypercholesterolemia -     Comprehensive metabolic panel -     Lipid panel  Insulin resistance -     CBC with Differential/Platelet -     Hemoglobin A1c  Dysuria -     Urinalysis, Routine w reflex microscopic -     Urine Culture  Encounter for immunization -     Flu vaccine HIGH DOSE PF   . Reviewed expectations re: course of current medical issues. . Discussed self-management of symptoms. . Outlined signs and symptoms indicating need for more acute intervention. . Patient verbalized understanding and all questions were answered. [name] Health Maintenance issues including appropriate healthy diet, exercise, and smoking avoidance were discussed with patient. . See orders for this visit as documented in the electronic medical record. . Patient received an After Visit Summary.  CMA served as Education administrator during this visit. History, Physical, and Plan performed by medical provider. The above documentation has been reviewed and is accurate and complete. Briscoe Deutscher, D.O.  Briscoe Deutscher, DO [place], [facility] 09/14/2018

## 2018-09-14 ENCOUNTER — Ambulatory Visit (INDEPENDENT_AMBULATORY_CARE_PROVIDER_SITE_OTHER): Payer: Medicare HMO | Admitting: Family Medicine

## 2018-09-14 ENCOUNTER — Encounter: Payer: Self-pay | Admitting: Family Medicine

## 2018-09-14 VITALS — BP 118/72 | HR 75 | Temp 97.7°F | Ht 63.0 in | Wt 147.4 lb

## 2018-09-14 DIAGNOSIS — F419 Anxiety disorder, unspecified: Secondary | ICD-10-CM | POA: Diagnosis not present

## 2018-09-14 DIAGNOSIS — R69 Illness, unspecified: Secondary | ICD-10-CM | POA: Diagnosis not present

## 2018-09-14 DIAGNOSIS — R300 Dysuria: Secondary | ICD-10-CM

## 2018-09-14 DIAGNOSIS — Z23 Encounter for immunization: Secondary | ICD-10-CM | POA: Diagnosis not present

## 2018-09-14 DIAGNOSIS — E039 Hypothyroidism, unspecified: Secondary | ICD-10-CM

## 2018-09-14 DIAGNOSIS — F329 Major depressive disorder, single episode, unspecified: Secondary | ICD-10-CM

## 2018-09-14 DIAGNOSIS — E7800 Pure hypercholesterolemia, unspecified: Secondary | ICD-10-CM

## 2018-09-14 DIAGNOSIS — E88819 Insulin resistance, unspecified: Secondary | ICD-10-CM

## 2018-09-14 DIAGNOSIS — F32A Depression, unspecified: Secondary | ICD-10-CM

## 2018-09-14 DIAGNOSIS — E8881 Metabolic syndrome: Secondary | ICD-10-CM

## 2018-09-14 LAB — COMPREHENSIVE METABOLIC PANEL
ALT: 16 U/L (ref 0–35)
AST: 20 U/L (ref 0–37)
Albumin: 4.4 g/dL (ref 3.5–5.2)
Alkaline Phosphatase: 73 U/L (ref 39–117)
BUN: 20 mg/dL (ref 6–23)
CO2: 32 mEq/L (ref 19–32)
Calcium: 9.6 mg/dL (ref 8.4–10.5)
Chloride: 104 mEq/L (ref 96–112)
Creatinine, Ser: 0.74 mg/dL (ref 0.40–1.20)
GFR: 82.65 mL/min (ref >60.00)
Glucose, Bld: 101 mg/dL — ABNORMAL HIGH (ref 70–99)
Potassium: 4.3 mEq/L (ref 3.5–5.1)
Sodium: 140 mEq/L (ref 135–145)
Total Bilirubin: 0.4 mg/dL (ref 0.2–1.2)
Total Protein: 7.2 g/dL (ref 6.0–8.3)

## 2018-09-14 LAB — URINALYSIS, ROUTINE W REFLEX MICROSCOPIC
Bilirubin Urine: NEGATIVE
Hgb urine dipstick: NEGATIVE
Ketones, ur: NEGATIVE
Leukocytes, UA: NEGATIVE
Nitrite: NEGATIVE
RBC / HPF: NONE SEEN (ref 0–?)
Specific Gravity, Urine: 1.01 (ref 1.000–1.030)
Total Protein, Urine: NEGATIVE
Urine Glucose: NEGATIVE
Urobilinogen, UA: 0.2 (ref 0.0–1.0)
pH: 6 (ref 5.0–8.0)

## 2018-09-14 LAB — T4, FREE: Free T4: 1 ng/dL (ref 0.60–1.60)

## 2018-09-14 LAB — CBC WITH DIFFERENTIAL/PLATELET
Basophils Absolute: 0 10*3/uL (ref 0.0–0.1)
Basophils Relative: 0.6 % (ref 0.0–3.0)
Eosinophils Absolute: 0.4 10*3/uL (ref 0.0–0.7)
Eosinophils Relative: 6 % — ABNORMAL HIGH (ref 0.0–5.0)
HCT: 37.2 % (ref 36.0–46.0)
Hemoglobin: 12.4 g/dL (ref 12.0–15.0)
Lymphocytes Relative: 30.4 % (ref 12.0–46.0)
Lymphs Abs: 1.8 10*3/uL (ref 0.7–4.0)
MCHC: 33.3 g/dL (ref 30.0–36.0)
MCV: 93 fl (ref 78.0–100.0)
Monocytes Absolute: 0.4 10*3/uL (ref 0.1–1.0)
Monocytes Relative: 6.6 % (ref 3.0–12.0)
Neutro Abs: 3.4 10*3/uL (ref 1.4–7.7)
Neutrophils Relative %: 56.4 % (ref 43.0–77.0)
Platelets: 289 10*3/uL (ref 150.0–400.0)
RBC: 4.01 Mil/uL (ref 3.87–5.11)
RDW: 13.5 % (ref 11.5–15.5)
WBC: 6.1 10*3/uL (ref 4.0–10.5)

## 2018-09-14 LAB — LIPID PANEL
Cholesterol: 179 mg/dL (ref 0–200)
HDL: 57.5 mg/dL (ref >39.00)
LDL Cholesterol: 108 mg/dL — ABNORMAL HIGH (ref 0–99)
NonHDL: 121.2
Total CHOL/HDL Ratio: 3
Triglycerides: 66 mg/dL (ref 0.0–149.0)
VLDL: 13.2 mg/dL (ref 0.0–40.0)

## 2018-09-14 LAB — HEMOGLOBIN A1C: Hgb A1c MFr Bld: 6 % (ref 4.6–6.5)

## 2018-09-14 LAB — TSH: TSH: 0.53 u[IU]/mL (ref 0.35–4.50)

## 2018-09-14 MED ORDER — BUPROPION HCL ER (XL) 150 MG PO TB24: 150.0000 mg | ORAL_TABLET | Freq: Every day | ORAL | 0 refills | Status: DC

## 2018-09-15 LAB — URINE CULTURE
MICRO NUMBER:: 91107739
Result:: NO GROWTH
SPECIMEN QUALITY:: ADEQUATE

## 2018-09-16 ENCOUNTER — Ambulatory Visit: Payer: Medicare HMO | Admitting: Family Medicine

## 2018-09-21 ENCOUNTER — Other Ambulatory Visit: Payer: Self-pay | Admitting: Family Medicine

## 2018-09-21 DIAGNOSIS — E782 Mixed hyperlipidemia: Secondary | ICD-10-CM

## 2018-09-23 ENCOUNTER — Other Ambulatory Visit: Payer: Self-pay

## 2018-09-23 ENCOUNTER — Telehealth: Payer: Self-pay | Admitting: Family Medicine

## 2018-09-23 DIAGNOSIS — R300 Dysuria: Secondary | ICD-10-CM

## 2018-09-23 NOTE — Telephone Encounter (Signed)
Can she drop off urine

## 2018-09-23 NOTE — Telephone Encounter (Signed)
See note

## 2018-09-23 NOTE — Telephone Encounter (Signed)
Ok to send referral  

## 2018-09-23 NOTE — Telephone Encounter (Signed)
Called patient order placed she will come by office tomorrow to give sample. Referral entered.

## 2018-09-23 NOTE — Telephone Encounter (Signed)
See note >> Sep 23, 2018 10:43 AM Morgan [name] wrote: Patient would also like to know if the provider will authorize her bringing in a urine sample to have checked. Please call [phone].

## 2018-09-23 NOTE — Telephone Encounter (Signed)
Yes to referral and UA with micro/Ucx.

## 2018-09-23 NOTE — Telephone Encounter (Signed)
Copied from [place] [id number]. Topic: Referral - Request >> Sep 23, 2018  9:14 AM Burchel, Abbi R wrote: CRM for notification. See Telephone encounter for: 09/23/18.  Pt requesting referral to Urology.  Pt states she was seen for UTI on Monday 09/14/18, and was given abx.  She has finished abx, but is still experiencing frequent urination with uncomfortable sensation (pain/pressure).  Please call pt on cell to schedule/discuss.    Pt Cell: [phone]

## 2018-09-24 ENCOUNTER — Other Ambulatory Visit (INDEPENDENT_AMBULATORY_CARE_PROVIDER_SITE_OTHER): Payer: Medicare HMO

## 2018-09-24 DIAGNOSIS — R300 Dysuria: Secondary | ICD-10-CM

## 2018-09-24 LAB — URINALYSIS, ROUTINE W REFLEX MICROSCOPIC
Bilirubin Urine: NEGATIVE
Hgb urine dipstick: NEGATIVE
Ketones, ur: NEGATIVE
Nitrite: NEGATIVE
RBC / HPF: NONE SEEN (ref 0–?)
Specific Gravity, Urine: <=1.005 — AB (ref 1.000–1.030)
Total Protein, Urine: NEGATIVE
Urine Glucose: NEGATIVE
Urobilinogen, UA: 0.2 (ref 0.0–1.0)
pH: 6 (ref 5.0–8.0)

## 2018-09-25 LAB — URINE CULTURE
MICRO NUMBER:: 91158712
SPECIMEN QUALITY:: ADEQUATE

## 2018-10-02 ENCOUNTER — Other Ambulatory Visit: Payer: Self-pay | Admitting: Family Medicine

## 2018-10-02 DIAGNOSIS — F329 Major depressive disorder, single episode, unspecified: Secondary | ICD-10-CM

## 2018-10-02 DIAGNOSIS — F32A Depression, unspecified: Secondary | ICD-10-CM

## 2018-10-02 DIAGNOSIS — F419 Anxiety disorder, unspecified: Secondary | ICD-10-CM

## 2018-10-07 ENCOUNTER — Other Ambulatory Visit: Payer: Self-pay | Admitting: Family Medicine

## 2018-10-07 DIAGNOSIS — F419 Anxiety disorder, unspecified: Secondary | ICD-10-CM

## 2018-10-07 DIAGNOSIS — F32A Depression, unspecified: Secondary | ICD-10-CM

## 2018-10-07 DIAGNOSIS — F329 Major depressive disorder, single episode, unspecified: Secondary | ICD-10-CM

## 2018-10-08 ENCOUNTER — Ambulatory Visit
Admission: RE | Admit: 2018-10-08 | Discharge: 2018-10-08 | Disposition: A | Discharge status: Home / Self Care | Payer: Medicare HMO | Source: Ambulatory Visit | Attending: Family Medicine | Admitting: Family Medicine

## 2018-10-08 DIAGNOSIS — Z1231 Encounter for screening mammogram for malignant neoplasm of breast: Secondary | ICD-10-CM

## 2018-11-03 DIAGNOSIS — R300 Dysuria: Secondary | ICD-10-CM | POA: Diagnosis not present

## 2018-11-06 DIAGNOSIS — R350 Frequency of micturition: Secondary | ICD-10-CM | POA: Diagnosis not present

## 2018-11-06 DIAGNOSIS — M62838 Other muscle spasm: Secondary | ICD-10-CM | POA: Diagnosis not present

## 2018-11-06 DIAGNOSIS — M6289 Other specified disorders of muscle: Secondary | ICD-10-CM | POA: Diagnosis not present

## 2018-11-06 DIAGNOSIS — R300 Dysuria: Secondary | ICD-10-CM | POA: Diagnosis not present

## 2018-11-16 DIAGNOSIS — R300 Dysuria: Secondary | ICD-10-CM | POA: Diagnosis not present

## 2018-11-16 DIAGNOSIS — R350 Frequency of micturition: Secondary | ICD-10-CM | POA: Diagnosis not present

## 2018-11-16 DIAGNOSIS — M62838 Other muscle spasm: Secondary | ICD-10-CM | POA: Diagnosis not present

## 2018-11-16 DIAGNOSIS — M6289 Other specified disorders of muscle: Secondary | ICD-10-CM | POA: Diagnosis not present

## 2018-11-23 DIAGNOSIS — M6289 Other specified disorders of muscle: Secondary | ICD-10-CM | POA: Diagnosis not present

## 2018-11-23 DIAGNOSIS — M62838 Other muscle spasm: Secondary | ICD-10-CM | POA: Diagnosis not present

## 2018-11-23 DIAGNOSIS — R300 Dysuria: Secondary | ICD-10-CM | POA: Diagnosis not present

## 2018-11-23 DIAGNOSIS — R350 Frequency of micturition: Secondary | ICD-10-CM | POA: Diagnosis not present

## 2018-12-14 DIAGNOSIS — R350 Frequency of micturition: Secondary | ICD-10-CM | POA: Diagnosis not present

## 2018-12-14 DIAGNOSIS — M6289 Other specified disorders of muscle: Secondary | ICD-10-CM | POA: Diagnosis not present

## 2018-12-14 DIAGNOSIS — R300 Dysuria: Secondary | ICD-10-CM | POA: Diagnosis not present

## 2018-12-14 DIAGNOSIS — M62838 Other muscle spasm: Secondary | ICD-10-CM | POA: Diagnosis not present

## 2018-12-15 ENCOUNTER — Ambulatory Visit: Payer: Medicare HMO | Admitting: Family Medicine

## 2018-12-28 ENCOUNTER — Other Ambulatory Visit: Payer: Self-pay | Admitting: Family Medicine

## 2018-12-28 DIAGNOSIS — F329 Major depressive disorder, single episode, unspecified: Secondary | ICD-10-CM

## 2018-12-28 DIAGNOSIS — F419 Anxiety disorder, unspecified: Secondary | ICD-10-CM

## 2018-12-28 DIAGNOSIS — F32A Depression, unspecified: Secondary | ICD-10-CM

## 2019-03-02 DIAGNOSIS — R69 Illness, unspecified: Secondary | ICD-10-CM | POA: Diagnosis not present

## 2019-03-15 ENCOUNTER — Other Ambulatory Visit: Payer: Self-pay | Admitting: Family Medicine

## 2019-03-15 DIAGNOSIS — F419 Anxiety disorder, unspecified: Secondary | ICD-10-CM

## 2019-03-15 DIAGNOSIS — F32A Depression, unspecified: Secondary | ICD-10-CM

## 2019-03-15 DIAGNOSIS — F329 Major depressive disorder, single episode, unspecified: Secondary | ICD-10-CM

## 2019-05-10 ENCOUNTER — Encounter: Payer: Self-pay | Admitting: Family Medicine

## 2019-05-10 ENCOUNTER — Other Ambulatory Visit: Payer: Self-pay

## 2019-05-10 ENCOUNTER — Ambulatory Visit (INDEPENDENT_AMBULATORY_CARE_PROVIDER_SITE_OTHER): Payer: Medicare HMO | Admitting: Family Medicine

## 2019-05-10 VITALS — HR 68 | Ht 63.0 in | Wt 147.0 lb

## 2019-05-10 DIAGNOSIS — F5102 Adjustment insomnia: Secondary | ICD-10-CM | POA: Diagnosis not present

## 2019-05-10 DIAGNOSIS — F419 Anxiety disorder, unspecified: Secondary | ICD-10-CM

## 2019-05-10 DIAGNOSIS — E039 Hypothyroidism, unspecified: Secondary | ICD-10-CM | POA: Diagnosis not present

## 2019-05-10 DIAGNOSIS — E88819 Insulin resistance, unspecified: Secondary | ICD-10-CM

## 2019-05-10 DIAGNOSIS — F329 Major depressive disorder, single episode, unspecified: Secondary | ICD-10-CM | POA: Diagnosis not present

## 2019-05-10 DIAGNOSIS — F32A Depression, unspecified: Secondary | ICD-10-CM

## 2019-05-10 DIAGNOSIS — L0390 Cellulitis, unspecified: Secondary | ICD-10-CM | POA: Diagnosis not present

## 2019-05-10 DIAGNOSIS — E8881 Metabolic syndrome: Secondary | ICD-10-CM | POA: Diagnosis not present

## 2019-05-10 DIAGNOSIS — R69 Illness, unspecified: Secondary | ICD-10-CM | POA: Diagnosis not present

## 2019-05-10 DIAGNOSIS — W57XXXA Bitten or stung by nonvenomous insect and other nonvenomous arthropods, initial encounter: Secondary | ICD-10-CM | POA: Diagnosis not present

## 2019-05-10 DIAGNOSIS — G4733 Obstructive sleep apnea (adult) (pediatric): Secondary | ICD-10-CM

## 2019-05-10 DIAGNOSIS — E7800 Pure hypercholesterolemia, unspecified: Secondary | ICD-10-CM

## 2019-05-10 MED ORDER — DOXYCYCLINE HYCLATE 100 MG PO TABS: 100.0000 mg | ORAL_TABLET | Freq: Two times a day (BID) | ORAL | 0 refills | Status: DC

## 2019-05-10 MED ORDER — DIAZEPAM 5 MG PO TABS: 2.5000 mg | ORAL_TABLET | Freq: Three times a day (TID) | ORAL | 1 refills | Status: DC | PRN

## 2019-05-10 NOTE — Progress Notes (Signed)
Virtual Visit via Video   Due to the COVID-19 pandemic, this visit was completed with telemedicine (audio/video) technology to reduce patient and provider exposure as well as to preserve personal protective equipment.   I connected with Howell Pringle by a video enabled telemedicine application and verified that I am speaking with the correct person using two identifiers. Location patient: Home Location provider: [facility], Office Persons participating in the virtual visit: Shakara Tweedy, Briscoe Deutscher, DO Lonell Grandchild, CMA acting as scribe for Dr. Briscoe Deutscher.   I discussed the limitations of evaluation and management by telemedicine and the availability of in person appointments. The patient expressed understanding and agreed to proceed.  Care Team   Patient Care Team: Briscoe Deutscher, DO as PCP - General (Family Medicine) Ladene Artist, MD as Consulting Physician (Gastroenterology)  Subjective:   HPI: Patient has stopped taking the Welbutrin. Made her feel like she was jumping out of her skin. She does need refill on Valium 5mg . She only takes 1/2 tab as needed to help with sleeping.   Tick bite: Lower abdomen about three days ago. She does have a little pink area size of quarter. She denies any rash or fever.    Review of Systems  Constitutional: Negative for chills and fever.  HENT: Negative for hearing loss and tinnitus.   Eyes: Negative for blurred vision and double vision.  Respiratory: Negative for cough.   Cardiovascular: Negative for chest pain and palpitations.  Gastrointestinal: Negative for heartburn and nausea.  Genitourinary: Negative for dysuria and urgency.  Musculoskeletal: Negative for myalgias and neck pain.  Skin: Negative for rash.  Neurological: Negative for dizziness and headaches.  Endo/Heme/Allergies: Does not bruise/bleed easily.  Psychiatric/Behavioral: Negative for depression and suicidal ideas.    Patient Active Problem  List   Diagnosis Date Noted  . Insulin resistance 09/13/2018  . Anxiety and depression 02/22/2014  . Mild mitral regurgitation by prior echocardiogram 07/24/2010  . OSA (obstructive sleep apnea), not using CPAP 07/04/2010  . Diastolic dysfunction 52/84/1324  . Overweight 06/22/2009  . History of colon polyps 01/18/2008  . Hyperlipidemia 07/14/2007  . Hypothyroidism 06/29/2007  . Allergic rhinitis 06/29/2007  . Osteoarthritis 06/29/2007    Social History   Tobacco Use  . Smoking status: Never Smoker  . Smokeless tobacco: Never Used  Substance Use Topics  . Alcohol use: Yes    Comment: rare   Current Outpatient Medications:  .  acyclovir (ZOVIRAX) 400 MG tablet, Take 1 tablet (400 mg total) by mouth 3 (three) times daily. Take only as needed, Disp: 15 tablet, Rfl: 6 .  atorvastatin (LIPITOR) 10 MG tablet, TAKE 1 TABLET DAILY, Disp: 90 tablet, Rfl: 3 .  diazepam (VALIUM) 5 MG tablet, Take 0.5-1 tablets (2.5-5 mg total) by mouth every 8 (eight) hours as needed for anxiety., Disp: 30 tablet, Rfl: 1 .  DULoxetine (CYMBALTA) 30 MG capsule, TAKE 1 CAPSULE DAILY, Disp: 90 capsule, Rfl: 0 .  fluticasone (FLONASE) 50 MCG/ACT nasal spray, Place 2 sprays into both nostrils daily., Disp: 16 g, Rfl: 6 .  levothyroxine (SYNTHROID, LEVOTHROID) 75 MCG tablet, TAKE 1 TABLET DAILY, Disp: 90 tablet, Rfl: 1  Allergies  Allergen Reactions  . Simvastatin Other (See Comments)    Myalgia  . Sulfonamide Derivatives Rash   Objective:   VITALS: Per patient if applicable, see vitals. GENERAL: Alert, appears well and in no acute distress. HEENT: Atraumatic, conjunctiva clear, no obvious abnormalities on inspection of external nose and ears. NECK:  Normal movements of the head and neck. CARDIOPULMONARY: No increased WOB. Speaking in clear sentences. I:E ratio WNL.  MS: Moves all visible extremities without noticeable abnormality. PSYCH: Pleasant and cooperative, well-groomed. Speech normal rate and  rhythm. Affect is appropriate. Insight and judgement are appropriate. Attention is focused, linear, and appropriate.  NEURO: CN grossly intact. Oriented as arrived to appointment on time with no prompting. Moves both UE equally.  SKIN: No obvious lesions, wounds, erythema, or cyanosis noted on face or hands.  Depression screen [facility] 2/9 09/14/2018 03/16/2018 09/15/2017  Decreased Interest 1 0 0  Down, Depressed, Hopeless 1 1 0  PHQ - 2 Score 2 1 0  Altered sleeping 0 1 0  Tired, decreased energy 3 2 0  Change in appetite 1 0 0  Feeling bad or failure about yourself  1 0 0  Trouble concentrating 1 1 0  Moving slowly or fidgety/restless 0 0 0  Suicidal thoughts 0 0 0  PHQ-9 Score 8 5 0  Difficult doing work/chores Somewhat difficult Not difficult at all -    Assessment and Plan:   Melonie was seen today for follow-up.  Diagnoses and all orders for this visit:  Pure hypercholesterolemia -     Comprehensive metabolic panel; Future -     Lipid panel; Future  Anxiety and depression -     Discontinue: diazepam (VALIUM) 5 MG tablet; Take 0.5-1 tablets (2.5-5 mg total) by mouth every 8 (eight) hours as needed for anxiety.  Adjustment insomnia -     Discontinue: diazepam (VALIUM) 5 MG tablet; Take 0.5-1 tablets (2.5-5 mg total) by mouth every 8 (eight) hours as needed for anxiety. -     diazepam (VALIUM) 5 MG tablet; Take 0.5-1 tablets (2.5-5 mg total) by mouth every 8 (eight) hours as needed for anxiety.  Acquired hypothyroidism -     TSH; Future  Insulin resistance -     Hemoglobin A1c; Future  OSA (obstructive sleep apnea), not using CPAP -     CBC with Differential/Platelet; Future  Tick bite, initial encounter -     doxycycline (VIBRA-TABS) 100 MG tablet; Take 1 tablet (100 mg total) by mouth 2 (two) times daily.  Cellulitis, unspecified cellulitis site -     doxycycline (VIBRA-TABS) 100 MG tablet; Take 1 tablet (100 mg total) by mouth 2 (two) times daily.    [name] COVID-19  Education: The signs and symptoms of COVID-19 were discussed with the patient and how to seek care for testing if needed. The importance of social distancing was discussed today. . Reviewed expectations re: course of current medical issues. . Discussed self-management of symptoms. . Outlined signs and symptoms indicating need for more acute intervention. . Patient verbalized understanding and all questions were answered. [name] Health Maintenance issues including appropriate healthy diet, exercise, and smoking avoidance were discussed with patient. . See orders for this visit as documented in the electronic medical record.  Briscoe Deutscher, DO  Records requested if needed. Time spent: 25 minutes, of which >50% was spent in obtaining information about her symptoms, reviewing her previous labs, evaluations, and treatments, counseling her about her condition (please see the discussed topics above), and developing a plan to further investigate it; she had a number of questions which I addressed.

## 2019-05-13 ENCOUNTER — Other Ambulatory Visit: Payer: Self-pay

## 2019-05-13 ENCOUNTER — Other Ambulatory Visit (INDEPENDENT_AMBULATORY_CARE_PROVIDER_SITE_OTHER): Payer: Medicare HMO

## 2019-05-13 ENCOUNTER — Telehealth: Payer: Self-pay | Admitting: Family Medicine

## 2019-05-13 DIAGNOSIS — E7800 Pure hypercholesterolemia, unspecified: Secondary | ICD-10-CM

## 2019-05-13 DIAGNOSIS — E039 Hypothyroidism, unspecified: Secondary | ICD-10-CM

## 2019-05-13 LAB — CBC WITH DIFFERENTIAL/PLATELET
Basophils Absolute: 0 10*3/uL (ref 0.0–0.1)
Basophils Relative: 0.8 % (ref 0.0–3.0)
Eosinophils Absolute: 0.4 10*3/uL (ref 0.0–0.7)
Eosinophils Relative: 6.8 % — ABNORMAL HIGH (ref 0.0–5.0)
HCT: 36.7 % (ref 36.0–46.0)
Hemoglobin: 12.6 g/dL (ref 12.0–15.0)
Lymphocytes Relative: 36.7 % (ref 12.0–46.0)
Lymphs Abs: 1.9 10*3/uL (ref 0.7–4.0)
MCHC: 34.3 g/dL (ref 30.0–36.0)
MCV: 93.1 fl (ref 78.0–100.0)
Monocytes Absolute: 0.3 10*3/uL (ref 0.1–1.0)
Monocytes Relative: 5.8 % (ref 3.0–12.0)
Neutro Abs: 2.6 10*3/uL (ref 1.4–7.7)
Neutrophils Relative %: 49.9 % (ref 43.0–77.0)
Platelets: 286 10*3/uL (ref 150.0–400.0)
RBC: 3.94 Mil/uL (ref 3.87–5.11)
RDW: 13.4 % (ref 11.5–15.5)
WBC: 5.3 10*3/uL (ref 4.0–10.5)

## 2019-05-13 LAB — COMPREHENSIVE METABOLIC PANEL
ALT: 14 U/L (ref 0–35)
AST: 18 U/L (ref 0–37)
Albumin: 4.1 g/dL (ref 3.5–5.2)
Alkaline Phosphatase: 72 U/L (ref 39–117)
BUN: 21 mg/dL (ref 6–23)
CO2: 29 mEq/L (ref 19–32)
Calcium: 9 mg/dL (ref 8.4–10.5)
Chloride: 106 mEq/L (ref 96–112)
Creatinine, Ser: 0.74 mg/dL (ref 0.40–1.20)
GFR: 77.61 mL/min (ref >60.00)
Glucose, Bld: 100 mg/dL — ABNORMAL HIGH (ref 70–99)
Potassium: 4.3 mEq/L (ref 3.5–5.1)
Sodium: 142 mEq/L (ref 135–145)
Total Bilirubin: 0.5 mg/dL (ref 0.2–1.2)
Total Protein: 6.4 g/dL (ref 6.0–8.3)

## 2019-05-13 LAB — TSH: TSH: 0.53 u[IU]/mL (ref 0.35–4.50)

## 2019-05-13 LAB — LIPID PANEL
Cholesterol: 178 mg/dL (ref 0–200)
HDL: 57.7 mg/dL (ref >39.00)
LDL Cholesterol: 107 mg/dL — ABNORMAL HIGH (ref 0–99)
NonHDL: 120.21
Total CHOL/HDL Ratio: 3
Triglycerides: 67 mg/dL (ref 0.0–149.0)
VLDL: 13.4 mg/dL (ref 0.0–40.0)

## 2019-05-13 LAB — HEMOGLOBIN A1C: Hgb A1c MFr Bld: 6 % (ref 4.6–6.5)

## 2019-05-13 NOTE — Telephone Encounter (Signed)
Scheduled for an appt w/ Sam tomorrow morning.

## 2019-05-13 NOTE — Telephone Encounter (Signed)
Please advise   Copied from [place] [id number]. Topic: Appointment Scheduling - Scheduling Inquiry for Clinic >> May 13, 2019  2:05 PM Nils Flack wrote: Reason for CRM:[phone] Pt would like to have appt for Dr Juleen China for back pain  Please call

## 2019-05-14 ENCOUNTER — Encounter: Payer: Self-pay | Admitting: Physician Assistant

## 2019-05-14 ENCOUNTER — Ambulatory Visit (INDEPENDENT_AMBULATORY_CARE_PROVIDER_SITE_OTHER): Payer: Medicare HMO | Admitting: Physician Assistant

## 2019-05-14 DIAGNOSIS — M545 Low back pain, unspecified: Secondary | ICD-10-CM

## 2019-05-14 MED ORDER — PREDNISONE 5 MG PO TABS: ORAL_TABLET | ORAL | 0 refills | Status: DC

## 2019-05-14 MED ORDER — BACLOFEN 10 MG PO TABS: 10.0000 mg | ORAL_TABLET | Freq: Two times a day (BID) | ORAL | 0 refills | Status: DC | PRN

## 2019-05-14 NOTE — Progress Notes (Signed)
Virtual Visit via Video   I connected with Morgan [name] on 05/14/19 at 10:00 AM EDT by a video enabled telemedicine application and verified that I am speaking with the correct person using two identifiers. Location patient: Home Location provider:  [facility], Office Persons participating in the virtual visit: Morgan [name], Morgan Coke PA-C, Morgan Pickler, LPN   I discussed the limitations of evaluation and management by telemedicine and the availability of in person appointments. The patient expressed understanding and agreed to proceed.  I acted as a Education administrator for [employer], PA-C [employer], LPN  Subjective:   HPI:  Back pain Pt c/o left lower back pain since Tuesday has not changed. "Feels like its in my left lower back", wraps around to the side. Pt is applying heat and ice off and on. She is taking Tylenol and Aleve with little relief. Also has taken Valium 5 mg at night to help with sleep. No pain, numbness, tingling. No rash.   Denies any inciting event. Did lift her 20 lb dog but does this often and has never had issues with this in the past. No issues with urination, denies blood in urine, hx kidney stones.  Pain is 7/10. Lying down is the only thing that really makes her comfortable. She has tried some gentle exercises.  ROS: See pertinent positives and negatives per HPI.  Patient Active Problem List   Diagnosis Date Noted  . Insulin resistance 09/13/2018  . Anxiety and depression 02/22/2014  . Mild mitral regurgitation by prior echocardiogram 07/24/2010  . OSA (obstructive sleep apnea), not using CPAP 07/04/2010  . Diastolic dysfunction 05/39/7673  . Overweight 06/22/2009  . History of colon polyps 01/18/2008  . Hyperlipidemia 07/14/2007  . Hypothyroidism 06/29/2007  . Allergic rhinitis 06/29/2007  . Osteoarthritis 06/29/2007    Social History   Tobacco Use  . Smoking status: Never Smoker  . Smokeless tobacco: Never Used   Substance Use Topics  . Alcohol use: Yes    Comment: rare    Current Outpatient Medications:  .  acyclovir (ZOVIRAX) 400 MG tablet, Take 1 tablet (400 mg total) by mouth 3 (three) times daily. Take only as needed, Disp: 15 tablet, Rfl: 6 .  atorvastatin (LIPITOR) 10 MG tablet, TAKE 1 TABLET DAILY, Disp: 90 tablet, Rfl: 3 .  diazepam (VALIUM) 5 MG tablet, Take 0.5-1 tablets (2.5-5 mg total) by mouth every 8 (eight) hours as needed for anxiety., Disp: 30 tablet, Rfl: 1 .  doxycycline (VIBRA-TABS) 100 MG tablet, Take 1 tablet (100 mg total) by mouth 2 (two) times daily., Disp: 20 tablet, Rfl: 0 .  DULoxetine (CYMBALTA) 30 MG capsule, TAKE 1 CAPSULE DAILY, Disp: 90 capsule, Rfl: 0 .  fluticasone (FLONASE) 50 MCG/ACT nasal spray, Place 2 sprays into both nostrils daily., Disp: 16 g, Rfl: 6 .  levothyroxine (SYNTHROID, LEVOTHROID) 75 MCG tablet, TAKE 1 TABLET DAILY, Disp: 90 tablet, Rfl: 1 .  baclofen (LIORESAL) 10 MG tablet, Take 1 tablet (10 mg total) by mouth 2 (two) times daily as needed for muscle spasms., Disp: 30 each, Rfl: 0 .  predniSONE (DELTASONE) 5 MG tablet, 6-5-4-3-2-1-off, Disp: 21 tablet, Rfl: 0  Allergies  Allergen Reactions  . Simvastatin Other (See Comments)    Myalgia  . Sulfonamide Derivatives Rash    Objective:   VITALS: Per patient if applicable, see vitals. GENERAL: Alert, appears well and in no acute distress. HEENT: Atraumatic, conjunctiva clear, no obvious abnormalities on inspection of external nose and ears. NECK:  Normal movements of the head and neck. CARDIOPULMONARY: No increased WOB. Speaking in clear sentences. I:E ratio WNL.  MS: Moves all visible extremities without noticeable abnormality. PSYCH: Pleasant and cooperative, well-groomed. Speech normal rate and rhythm. Affect is appropriate. Insight and judgement are appropriate. Attention is focused, linear, and appropriate.  NEURO: CN grossly intact. Oriented as arrived to appointment on time with no  prompting. Moves both UE equally.  SKIN: No obvious lesions, wounds, erythema, or cyanosis noted on face or hands.  Assessment and Plan:   [name] was seen today for back pain.  Diagnoses and all orders for this visit:  Acute left-sided low back pain without sciatica No red flags on discussion. Suspect muscle strain/spasm. Will trial oral prednisone and baclofen. Encouraged gentle exercises as tolerated. Will follow-up with patient on Monday to see how she is doing. Worsening precautions advised.  Other orders -     baclofen (LIORESAL) 10 MG tablet; Take 1 tablet (10 mg total) by mouth 2 (two) times daily as needed for muscle spasms. -     predniSONE (DELTASONE) 5 MG tablet; 6-5-4-3-2-1-off    . Reviewed expectations re: course of current medical issues. . Discussed self-management of symptoms. . Outlined signs and symptoms indicating need for more acute intervention. . Patient verbalized understanding and all questions were answered. [name] Health Maintenance issues including appropriate healthy diet, exercise, and smoking avoidance were discussed with patient. . See orders for this visit as documented in the electronic medical record.  I discussed the assessment and treatment plan with the patient. The patient was provided an opportunity to ask questions and all were answered. The patient agreed with the plan and demonstrated an understanding of the instructions.   The patient was advised to call back or seek an in-person evaluation if the symptoms worsen or if the condition fails to improve as anticipated.   CMA or LPN served as scribe during this visit. History, Physical, and Plan performed by medical provider. The above documentation has been reviewed and is accurate and complete.   Morgan [name], [place] 05/14/2019

## 2019-05-15 ENCOUNTER — Encounter: Payer: Self-pay | Admitting: Family Medicine

## 2019-05-17 ENCOUNTER — Telehealth: Payer: Self-pay | Admitting: *Deleted

## 2019-05-17 NOTE — Telephone Encounter (Signed)
Left message on voicemail to call office. If pt is still having back pain need to schedule IN OFFICE VISIT per Aldona Bar.

## 2019-05-17 NOTE — Telephone Encounter (Signed)
Pt called back, her back pain has improved greatly. She said the prednisone has really helped and she is taking the muscle relaxer at bedtime cause it makes her sleepy. Told her glad she is feeling better if you do not continue to improve please call. Pt verbalized understanding.

## 2019-05-17 NOTE — Telephone Encounter (Signed)
-----   Message from [place], [place] sent at 05/14/2019 10:15 AM EDT ----- Regarding: call patient MONDAY 5/18 Please call patient to check in on back pain. If no improvement, please schedule IN OFFICE VISIT.

## 2019-07-01 ENCOUNTER — Telehealth: Payer: Self-pay | Admitting: Family Medicine

## 2019-07-01 ENCOUNTER — Other Ambulatory Visit: Payer: Self-pay

## 2019-07-01 DIAGNOSIS — F32A Depression, unspecified: Secondary | ICD-10-CM

## 2019-07-01 DIAGNOSIS — F419 Anxiety disorder, unspecified: Secondary | ICD-10-CM

## 2019-07-01 DIAGNOSIS — F329 Major depressive disorder, single episode, unspecified: Secondary | ICD-10-CM

## 2019-07-01 MED ORDER — DULOXETINE HCL 30 MG PO CPEP: 30.0000 mg | ORAL_CAPSULE | Freq: Every day | ORAL | 0 refills | Status: DC

## 2019-07-01 NOTE — Telephone Encounter (Signed)
Rx sent in

## 2019-07-01 NOTE — Telephone Encounter (Signed)
See note

## 2019-07-01 NOTE — Telephone Encounter (Signed)
Rx refill request Last fill 03/15/19  #90/0 Last ov 05/10/19

## 2019-07-01 NOTE — Telephone Encounter (Signed)
Pt called in and stated that she found out that she can get the DULoxetine (CYMBALTA) 30 MG capsule [[id number]]  Cheaper at [facility] on [address] .  She would like to know if this med could be called in over there?   Best number  [phone]

## 2019-08-25 ENCOUNTER — Telehealth: Payer: Self-pay | Admitting: Family Medicine

## 2019-08-25 NOTE — Telephone Encounter (Signed)
I left a message asking the patient to call me at [phone] to schedule AWV with Courtney. VDM (Dee-Dee) °

## 2019-08-31 ENCOUNTER — Ambulatory Visit: Payer: Self-pay | Admitting: *Deleted

## 2019-08-31 NOTE — Telephone Encounter (Signed)
Patient is calling to states she feels terrible- she has been taking antihistamine and dramamine- patient states it started on Friday. Patient thought it was vertigo- but it is different and not responding to normal treatment. Patient states she is feeling weak all over. Patient reports she has been eating and hydrating. Call to office for appointment- note requested for review and scheduling. Best contact for patient- (home number) [phone]  Reason for Disposition . [1] Dizziness caused by heat exposure, sudden standing, or poor fluid intake AND [2] no improvement after 2 hours of rest and fluids  Answer Assessment - Initial Assessment Questions 1. DESCRIPTION: "Describe your dizziness."     Feels off- weak all over 2. LIGHTHEADED: "Do you feel lightheaded?" (e.g., somewhat faint, woozy, weak upon standing)     Bending is hard- feels off and weak 3. VERTIGO: "Do you feel like either you or the room is spinning or tilting?" (i.e. vertigo)     Patient has history- but this is different 4. SEVERITY: "How bad is it?"  "Do you feel like you are going to faint?" "Can you stand and walk?"   - MILD - walking normally   - MODERATE - interferes with normal activities (e.g., work, school)    - SEVERE - unable to stand, requires support to walk, feels like passing out now.      Mild/moderate 5. ONSET:  "When did the dizziness begin?"     Friday 6. AGGRAVATING FACTORS: "Does anything make it worse?" (e.g., standing, change in head position)     Change in position- bending 7. HEART RATE: "Can you tell me your heart rate?" "How many beats in 15 seconds?"  (Note: not all patients can do this)       Patient does not feel her heart rate in increased 8. CAUSE: "What do you think is causing the dizziness?"     unsure 9. RECURRENT SYMPTOM: "Have you had dizziness before?" If so, ask: "When was the last time?" "What happened that time?"     Not like this 10. OTHER SYMPTOMS: "Do you have any other  symptoms?" (e.g., fever, chest pain, vomiting, diarrhea, bleeding)       Hot flashes 11. PREGNANCY: "Is there any chance you are pregnant?" "When was your last menstrual period?"       n/a  Protocols used: DIZZINESS [facility]

## 2019-09-02 NOTE — Progress Notes (Deleted)
Morgan [name] is a 67 y.o. female here for an acute visit.  History of Present Illness:   (SCRIBE ATTESTATION)  HPI:   PMHx, SurgHx, SocialHx, Medications, and Allergies were reviewed in the Visit Navigator and updated as appropriate.  Current Medications   Current Outpatient Medications:  .  acyclovir (ZOVIRAX) 400 MG tablet, Take 1 tablet (400 mg total) by mouth 3 (three) times daily. Take only as needed, Disp: 15 tablet, Rfl: 6 .  atorvastatin (LIPITOR) 10 MG tablet, TAKE 1 TABLET DAILY, Disp: 90 tablet, Rfl: 3 .  baclofen (LIORESAL) 10 MG tablet, Take 1 tablet (10 mg total) by mouth 2 (two) times daily as needed for muscle spasms., Disp: 30 each, Rfl: 0 .  diazepam (VALIUM) 5 MG tablet, Take 0.5-1 tablets (2.5-5 mg total) by mouth every 8 (eight) hours as needed for anxiety., Disp: 30 tablet, Rfl: 1 .  doxycycline (VIBRA-TABS) 100 MG tablet, Take 1 tablet (100 mg total) by mouth 2 (two) times daily., Disp: 20 tablet, Rfl: 0 .  DULoxetine (CYMBALTA) 30 MG capsule, Take 1 capsule (30 mg total) by mouth daily., Disp: 90 capsule, Rfl: 0 .  fluticasone (FLONASE) 50 MCG/ACT nasal spray, Place 2 sprays into both nostrils daily., Disp: 16 g, Rfl: 6 .  levothyroxine (SYNTHROID, LEVOTHROID) 75 MCG tablet, TAKE 1 TABLET DAILY, Disp: 90 tablet, Rfl: 1 .  predniSONE (DELTASONE) 5 MG tablet, 6-5-4-3-2-1-off, Disp: 21 tablet, Rfl: 0   Allergies  Allergen Reactions  . Simvastatin Other (See Comments)    Myalgia  . Sulfonamide Derivatives Rash   Review of Systems   Pertinent items are noted in the HPI. Otherwise, ROS is negative.  Vitals  There were no vitals filed for this visit.   There is no height or weight on file to calculate BMI.  Physical Exam   Physical Exam  Results for orders placed or performed in visit on 05/13/19  TSH  Result Value Ref Range   TSH 0.53 0.35 - 4.50 uIU/mL  Lipid panel  Result Value Ref Range   Cholesterol 178 0 - 200 mg/dL   Triglycerides  67.0 0.0 - 149.0 mg/dL   HDL 57.70 >39.00 mg/dL   VLDL 13.4 0.0 - 40.0 mg/dL   LDL Cholesterol 107 (H) 0 - 99 mg/dL   Total CHOL/HDL Ratio 3    NonHDL 120.21   Hemoglobin A1c  Result Value Ref Range   Hgb A1c MFr Bld 6.0 4.6 - 6.5 %  Comprehensive metabolic panel  Result Value Ref Range   Sodium 142 135 - 145 mEq/L   Potassium 4.3 3.5 - 5.1 mEq/L   Chloride 106 96 - 112 mEq/L   CO2 29 19 - 32 mEq/L   Glucose, Bld 100 (H) 70 - 99 mg/dL   BUN 21 6 - 23 mg/dL   Creatinine, Ser 0.74 0.40 - 1.20 mg/dL   Total Bilirubin 0.5 0.2 - 1.2 mg/dL   Alkaline Phosphatase 72 39 - 117 U/L   AST 18 0 - 37 U/L   ALT 14 0 - 35 U/L   Total Protein 6.4 6.0 - 8.3 g/dL   Albumin 4.1 3.5 - 5.2 g/dL   Calcium 9.0 8.4 - 10.5 mg/dL   GFR 77.61 >60.00 mL/min  CBC with Differential/Platelet  Result Value Ref Range   WBC 5.3 4.0 - 10.5 K/uL   RBC 3.94 3.87 - 5.11 Mil/uL   Hemoglobin 12.6 12.0 - 15.0 g/dL   HCT 36.7 36.0 - 46.0 %  MCV 93.1 78.0 - 100.0 fl   MCHC 34.3 30.0 - 36.0 g/dL   RDW 13.4 11.5 - 15.5 %   Platelets 286.0 150.0 - 400.0 K/uL   Neutrophils Relative % 49.9 43.0 - 77.0 %   Lymphocytes Relative 36.7 12.0 - 46.0 %   Monocytes Relative 5.8 3.0 - 12.0 %   Eosinophils Relative 6.8 (H) 0.0 - 5.0 %   Basophils Relative 0.8 0.0 - 3.0 %   Neutro Abs 2.6 1.4 - 7.7 K/uL   Lymphs Abs 1.9 0.7 - 4.0 K/uL   Monocytes Absolute 0.3 0.1 - 1.0 K/uL   Eosinophils Absolute 0.4 0.0 - 0.7 K/uL   Basophils Absolute 0.0 0.0 - 0.1 K/uL    Assessment and Plan   There are no diagnoses linked to this encounter.  . Reviewed expectations re: course of current medical issues. . Discussed self-management of symptoms. . Outlined signs and symptoms indicating need for more acute intervention. . Patient verbalized understanding and all questions were answered. [name] Health Maintenance issues including appropriate healthy diet, exercise, and smoking avoidance were discussed with patient. . See orders for this  visit as documented in the electronic medical record. . Patient received an After Visit Summary.  *** CMA served as Education administrator during this visit. History, Physical, and Plan performed by medical provider. The above documentation has been reviewed and is accurate and complete. Briscoe Deutscher, D.O.  Briscoe Deutscher, DO [place], [place] [facility] 09/02/2019

## 2019-09-03 ENCOUNTER — Other Ambulatory Visit: Payer: Self-pay

## 2019-09-03 ENCOUNTER — Ambulatory Visit: Payer: Medicare HMO | Admitting: Family Medicine

## 2019-09-03 ENCOUNTER — Encounter: Payer: Self-pay | Admitting: Family Medicine

## 2019-09-03 ENCOUNTER — Ambulatory Visit (INDEPENDENT_AMBULATORY_CARE_PROVIDER_SITE_OTHER): Payer: Medicare HMO | Admitting: Family Medicine

## 2019-09-03 VITALS — BP 137/76 | HR 73 | Temp 97.2°F | Ht 63.0 in | Wt 148.2 lb

## 2019-09-03 DIAGNOSIS — Z79899 Other long term (current) drug therapy: Secondary | ICD-10-CM | POA: Diagnosis not present

## 2019-09-03 DIAGNOSIS — R739 Hyperglycemia, unspecified: Secondary | ICD-10-CM | POA: Diagnosis not present

## 2019-09-03 DIAGNOSIS — R5383 Other fatigue: Secondary | ICD-10-CM

## 2019-09-03 DIAGNOSIS — Z23 Encounter for immunization: Secondary | ICD-10-CM | POA: Diagnosis not present

## 2019-09-03 DIAGNOSIS — R42 Dizziness and giddiness: Secondary | ICD-10-CM

## 2019-09-03 DIAGNOSIS — E039 Hypothyroidism, unspecified: Secondary | ICD-10-CM | POA: Diagnosis not present

## 2019-09-03 LAB — VITAMIN B12: Vitamin B-12: 492 pg/mL (ref 211–911)

## 2019-09-03 LAB — POC URINALSYSI DIPSTICK (AUTOMATED)
Bilirubin, UA: NEGATIVE
Blood, UA: NEGATIVE
Glucose, UA: NEGATIVE
Ketones, UA: NEGATIVE
Leukocytes, UA: NEGATIVE
Nitrite, UA: NEGATIVE
Protein, UA: NEGATIVE
Spec Grav, UA: 1.025 (ref 1.010–1.025)
Urobilinogen, UA: 0.2 E.U./dL
pH, UA: 6 (ref 5.0–8.0)

## 2019-09-03 LAB — COMPREHENSIVE METABOLIC PANEL
ALT: 15 U/L (ref 0–35)
AST: 18 U/L (ref 0–37)
Albumin: 4 g/dL (ref 3.5–5.2)
Alkaline Phosphatase: 67 U/L (ref 39–117)
BUN: 24 mg/dL — ABNORMAL HIGH (ref 6–23)
CO2: 29 mEq/L (ref 19–32)
Calcium: 9 mg/dL (ref 8.4–10.5)
Chloride: 105 mEq/L (ref 96–112)
Creatinine, Ser: 0.72 mg/dL (ref 0.40–1.20)
GFR: 80.03 mL/min (ref >60.00)
Glucose, Bld: 99 mg/dL (ref 70–99)
Potassium: 4.1 mEq/L (ref 3.5–5.1)
Sodium: 140 mEq/L (ref 135–145)
Total Bilirubin: 0.5 mg/dL (ref 0.2–1.2)
Total Protein: 6.3 g/dL (ref 6.0–8.3)

## 2019-09-03 LAB — CBC
HCT: 36.8 % (ref 36.0–46.0)
Hemoglobin: 12 g/dL (ref 12.0–15.0)
MCHC: 32.5 g/dL (ref 30.0–36.0)
MCV: 94 fl (ref 78.0–100.0)
Platelets: 287 10*3/uL (ref 150.0–400.0)
RBC: 3.92 Mil/uL (ref 3.87–5.11)
RDW: 13.6 % (ref 11.5–15.5)
WBC: 5.9 10*3/uL (ref 4.0–10.5)

## 2019-09-03 LAB — TSH: TSH: 0.25 u[IU]/mL — ABNORMAL LOW (ref 0.35–4.50)

## 2019-09-03 MED ORDER — AZELASTINE HCL 0.1 % NA SOLN: 2.0000 | Freq: Two times a day (BID) | NASAL | 12 refills | Status: DC

## 2019-09-03 NOTE — Telephone Encounter (Signed)
Looks like pt has already been scheduled with Dr. Jerline Pain for today at 10:40 AM. Acquanetta Belling to Dr. Jerline Pain as Juluis Rainier regarding triage note.

## 2019-09-03 NOTE — Telephone Encounter (Signed)
i'm happy to see her. Sounds like in person would be better: hard to evaluate dizziness virtually. Thanks.

## 2019-09-03 NOTE — Progress Notes (Signed)
   Chief Complaint:  Morgan [name] is a 68 y.o. female who presents for same day appointment with a chief complaint of fatigue.   Assessment/Plan:  Fatigue / Dizziness No red flags. Likely multifactorial. Check CBC, CMET, TSH, B12.  She does have some evidence of allergic rhinitis flare-encouraged her to continue Flonase and also start Astelin nasal spray.  If above blood work is negative and symptoms do not improve we will consider short course of prednisone for any potential rhinitis/sinusitis.  If still no improvement, may need another sleep study.  She will follow-up with her PCP next week.  Discussed reasons to return to care earlier.   Preventative Healthcare Flu vaccine given today     Subjective:  HPI:  Fatigue Started a couple of months ago. Worsened within the last week or so.  She felt "off balance" about 5 days ago. She thought it was due to allergies or vertigo and started taking several antihistamines. Symptoms seem to be improving over the last couple of days.  No obvious precipitating events.  No recent illnesses.  No medication changes.  She has a history of OSA and wakes up 1-2 times per night to let her dog out.  She feels very tired when getting up and does not feel rested.  No fevers or chills.  No numbness.  No weakness.  No syncope.  No other obvious alleviating or aggravating factors.   She is having some post-nasal drip and a trickle in the back of her throat as well.   ROS: Per HPI  PMH: She reports that she has never smoked. She has never used smokeless tobacco. She reports current alcohol use. She reports that she does not use drugs.      Objective:  Physical Exam: BP 137/76   Pulse 73   Temp (!) 97.2 F (36.2 C)   Ht 5\' 3"  (1.6 m)   Wt 148 lb 3.2 oz (67.2 kg)   SpO2 96%   BMI 26.25 kg/m   Gen: NAD, resting comfortably HEENT: TMs with clear effusion. CV: Regular rate and rhythm with no murmurs appreciated Pulm: Normal work of breathing,  clear to auscultation bilaterally with no crackles, wheezes, or rhonchi GI: Normal bowel sounds present. Soft, Nontender, Nondistended. MSK: Trace pretibial edema.  Strength 5 out of 5 throughout. Skin: Warm, dry Neuro: Grossly normal, moves all extremities.  Cranial nerves II through XII intact. Psych: Normal affect and thought content       Dennies Coate M. Jerline Pain, MD 09/03/2019 11:19 AM

## 2019-09-03 NOTE — Addendum Note (Signed)
Addended by: Francis Dowse T on: 09/03/2019 02:03 PM   Modules accepted: Orders

## 2019-09-03 NOTE — Progress Notes (Signed)
Please inform patient of the following:  Her TSH is about half of what it was last time it was checked. I think she is probably getting TOO MUCH thyroid. We can either decrease to 33mcg daily and have her recheck in 4-6 weeks or recheck in a week. She can also discuss further with her pcp. All of her other labs are NORMAL.  Algis Greenhouse. Jerline Pain, MD 09/03/2019 4:17 PM

## 2019-09-03 NOTE — Telephone Encounter (Signed)
Hi Dr. Jonni Sanger, pt was scheduled to do telephone visit with Dr. Juleen China today but she is out of the office. Can pt be scheduled in once of your virtual visit slots for today?

## 2019-09-03 NOTE — Patient Instructions (Signed)
It was very nice to see you today!  Please try the Astelin nasal spray.  We will check blood work today.  If your symptoms have not improved and your blood work is normal, we may consider a small course of prednisone to help clear out your allergies.  If you are still having issues we may need to get another sleep study.  Please follow-up with Dr. Juleen China for this and let me know if your symptoms worsen.  Take care, Dr Jerline Pain

## 2019-09-09 ENCOUNTER — Encounter: Payer: Self-pay | Admitting: Family Medicine

## 2019-09-09 ENCOUNTER — Ambulatory Visit (INDEPENDENT_AMBULATORY_CARE_PROVIDER_SITE_OTHER): Payer: Medicare HMO | Admitting: Family Medicine

## 2019-09-09 ENCOUNTER — Other Ambulatory Visit: Payer: Self-pay

## 2019-09-09 VITALS — Ht 63.0 in | Wt 148.0 lb

## 2019-09-09 DIAGNOSIS — R6889 Other general symptoms and signs: Secondary | ICD-10-CM | POA: Diagnosis not present

## 2019-09-09 DIAGNOSIS — E039 Hypothyroidism, unspecified: Secondary | ICD-10-CM | POA: Diagnosis not present

## 2019-09-09 DIAGNOSIS — Z20822 Contact with and (suspected) exposure to covid-19: Secondary | ICD-10-CM

## 2019-09-09 DIAGNOSIS — R42 Dizziness and giddiness: Secondary | ICD-10-CM

## 2019-09-09 DIAGNOSIS — Z20828 Contact with and (suspected) exposure to other viral communicable diseases: Secondary | ICD-10-CM

## 2019-09-09 NOTE — Progress Notes (Signed)
Virtual Visit via Video   Due to the COVID-19 pandemic, this visit was completed with telemedicine (audio/video) technology to reduce patient and provider exposure as well as to preserve personal protective equipment.   I connected with Morgan [name] by a video enabled telemedicine application and verified that I am speaking with the correct person using two identifiers. Location patient: Home Location provider: [facility], Office Persons participating in the virtual visit: Greenley Beucler, Briscoe Deutscher, DO   I discussed the limitations of evaluation and management by telemedicine and the availability of in person appointments. The patient expressed understanding and agreed to proceed.  Care Team   Patient Care Team: Briscoe Deutscher, DO as PCP - General (Family Medicine) Ladene Artist, MD as Consulting Physician (Gastroenterology)  Subjective:   HPI:   Evaluation last week for dizziness. Suspected vertigo, was put on Astelin. Feels that it was helpful. TSH was also low on lab check. 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 Possible exposure to COVID. Feels profound fatigue. No fever. Recent vertigo with runny nose.   Patient Active Problem List   Diagnosis Date Noted  . Insulin resistance 09/13/2018  . Anxiety and depression 02/22/2014  . Mild mitral regurgitation by prior echocardiogram 07/24/2010  . OSA (obstructive sleep apnea), not using CPAP 07/04/2010  . Diastolic dysfunction [id number]  . Overweight 06/22/2009  . History of colon polyps 01/18/2008  . Hyperlipidemia 07/14/2007  . Hypothyroidism 06/29/2007  . Allergic rhinitis 06/29/2007  . Osteoarthritis 06/29/2007    Social History   Tobacco Use  . Smoking status: Never Smoker  . Smokeless tobacco: Never Used  Substance Use Topics  . Alcohol use: Yes    Comment: rare    Current Outpatient Medications:  .  acyclovir (ZOVIRAX) 400 MG tablet, Take 1 tablet (400 mg total) by mouth 3 (three)  times daily. Take only as needed, Disp: 15 tablet, Rfl: 6 .  atorvastatin (LIPITOR) 10 MG tablet, TAKE 1 TABLET DAILY, Disp: 90 tablet, Rfl: 3 .  azelastine (ASTELIN) 0.1 % nasal spray, Place 2 sprays into both nostrils 2 (two) times daily., Disp: 30 mL, Rfl: 12 .  diazepam (VALIUM) 5 MG tablet, Take 0.5-1 tablets (2.5-5 mg total) by mouth every 8 (eight) hours as needed for anxiety., Disp: 30 tablet, Rfl: 1 .  DULoxetine (CYMBALTA) 30 MG capsule, Take 1 capsule (30 mg total) by mouth daily., Disp: 90 capsule, Rfl: 0 .  levothyroxine (SYNTHROID, LEVOTHROID) 75 MCG tablet, TAKE 1 TABLET DAILY, Disp: 90 tablet, Rfl: 1  Allergies  Allergen Reactions  . Simvastatin Other (See Comments)    Myalgia  . Sulfonamide Derivatives Rash    Objective:   VITALS: Per patient if applicable, see vitals. GENERAL: Alert, appears well and in no acute distress. HEENT: Atraumatic, conjunctiva clear, no obvious abnormalities on inspection of external nose and ears. NECK: Normal movements of the head and neck. CARDIOPULMONARY: No increased WOB. Speaking in clear sentences. I:E ratio WNL.  MS: Moves all visible extremities without noticeable abnormality. PSYCH: Pleasant and cooperative, well-groomed. Speech normal rate and rhythm. Affect is appropriate. Insight and judgement are appropriate. Attention is focused, linear, and appropriate.  NEURO: CN grossly intact. Oriented as arrived to appointment on time with no prompting. Moves both UE equally.  SKIN: No obvious lesions, wounds, erythema, or cyanosis noted on face or hands.  Depression screen [facility] 2/9 09/09/2019 09/14/2018 03/16/2018  Decreased Interest 1 1 0  Down, Depressed, Hopeless 1 1 1   PHQ -  2 Score 2 2 1   Altered sleeping 0 0 1  Tired, decreased energy 3 3 2   Change in appetite 0 1 0  Feeling bad or failure about yourself  0 1 0  Trouble concentrating 1 1 1   Moving slowly or fidgety/restless 0 0 0  Suicidal thoughts 0 0 0  PHQ-9 Score 6 8 5     Difficult doing work/chores Somewhat difficult Somewhat difficult Not difficult at all    Assessment and Plan:   Arline was seen today for follow-up.  Diagnoses and all orders for this visit:  Acquired hypothyroidism Comments: Recheck next week when off biotin x 1 week.   Exposure to SARS-associated coronavirus -     Novel Coronavirus, NAA ([facility]); Future  Dizziness Comments: Improved.  [name] COVID-19 Education: The signs and symptoms of COVID-19 were discussed with the patient and how to seek care for testing if needed. The importance of social distancing was discussed today. . Reviewed expectations re: course of current medical issues. . Discussed self-management of symptoms. . Outlined signs and symptoms indicating need for more acute intervention. . Patient verbalized understanding and all questions were answered. [name] Health Maintenance issues including appropriate healthy diet, exercise, and smoking avoidance were discussed with patient. . See orders for this visit as documented in the electronic medical record.  Briscoe Deutscher, DO

## 2019-09-10 LAB — NOVEL CORONAVIRUS, NAA: SARS-CoV-2, NAA: NOT DETECTED

## 2019-09-14 ENCOUNTER — Other Ambulatory Visit: Payer: Self-pay

## 2019-09-14 ENCOUNTER — Telehealth: Payer: Self-pay

## 2019-09-14 DIAGNOSIS — R69 Illness, unspecified: Secondary | ICD-10-CM | POA: Diagnosis not present

## 2019-09-14 DIAGNOSIS — E039 Hypothyroidism, unspecified: Secondary | ICD-10-CM

## 2019-09-14 NOTE — Telephone Encounter (Signed)
Called patient gave results she did need to have app for tsh recheck I have put order in and made app.

## 2019-09-14 NOTE — Telephone Encounter (Signed)
Copied from [place] [id number]. Topic: General - Other >> Sep 14, 2019 10:51 AM Rainey Pines A wrote: Patient is retuning Joellens call and would like a callback.

## 2019-09-16 ENCOUNTER — Ambulatory Visit: Payer: Medicare HMO

## 2019-09-17 ENCOUNTER — Ambulatory Visit (INDEPENDENT_AMBULATORY_CARE_PROVIDER_SITE_OTHER): Payer: Medicare HMO | Admitting: Family Medicine

## 2019-09-17 ENCOUNTER — Other Ambulatory Visit: Payer: Self-pay

## 2019-09-17 DIAGNOSIS — E039 Hypothyroidism, unspecified: Secondary | ICD-10-CM | POA: Diagnosis not present

## 2019-09-17 LAB — TSH: TSH: 0.3 u[IU]/mL — ABNORMAL LOW (ref 0.35–4.50)

## 2019-09-17 LAB — T4, FREE: Free T4: 0.99 ng/dL (ref 0.60–1.60)

## 2019-09-19 MED ORDER — LEVOTHYROXINE SODIUM 50 MCG PO TABS: 50.0000 ug | ORAL_TABLET | Freq: Every day | ORAL | 3 refills | Status: DC

## 2019-09-20 ENCOUNTER — Other Ambulatory Visit: Payer: Self-pay

## 2019-09-20 DIAGNOSIS — E039 Hypothyroidism, unspecified: Secondary | ICD-10-CM

## 2019-09-20 MED ORDER — LEVOTHYROXINE SODIUM 50 MCG PO TABS: 50.0000 ug | ORAL_TABLET | Freq: Every day | ORAL | 3 refills | Status: DC

## 2019-09-26 ENCOUNTER — Encounter: Payer: Self-pay | Admitting: Family Medicine

## 2019-09-26 NOTE — Progress Notes (Signed)
LABS ONLY. Briscoe Deutscher, DO

## 2019-10-02 ENCOUNTER — Other Ambulatory Visit: Payer: Self-pay | Admitting: Family Medicine

## 2019-10-02 DIAGNOSIS — F419 Anxiety disorder, unspecified: Secondary | ICD-10-CM

## 2019-10-02 DIAGNOSIS — F329 Major depressive disorder, single episode, unspecified: Secondary | ICD-10-CM

## 2019-10-02 DIAGNOSIS — F32A Depression, unspecified: Secondary | ICD-10-CM

## 2019-10-04 ENCOUNTER — Other Ambulatory Visit: Payer: Self-pay | Admitting: Family Medicine

## 2019-10-04 DIAGNOSIS — F419 Anxiety disorder, unspecified: Secondary | ICD-10-CM

## 2019-10-04 DIAGNOSIS — F32A Depression, unspecified: Secondary | ICD-10-CM

## 2019-10-04 DIAGNOSIS — F329 Major depressive disorder, single episode, unspecified: Secondary | ICD-10-CM

## 2019-10-04 MED ORDER — DULOXETINE HCL 30 MG PO CPEP: 30.0000 mg | ORAL_CAPSULE | Freq: Every day | ORAL | 0 refills | Status: DC

## 2019-10-04 NOTE — Telephone Encounter (Signed)
See note

## 2019-10-04 NOTE — Telephone Encounter (Signed)
Pt is leaving town and would like rx before thurs. Generic cymbalta. [facility]

## 2019-11-02 ENCOUNTER — Other Ambulatory Visit: Payer: Medicare HMO

## 2019-11-09 ENCOUNTER — Encounter: Payer: Medicare HMO | Admitting: Family Medicine

## 2019-11-12 ENCOUNTER — Other Ambulatory Visit: Payer: Self-pay

## 2019-11-12 ENCOUNTER — Ambulatory Visit (INDEPENDENT_AMBULATORY_CARE_PROVIDER_SITE_OTHER): Payer: Medicare HMO | Admitting: Family Medicine

## 2019-11-12 ENCOUNTER — Encounter: Payer: Self-pay | Admitting: Family Medicine

## 2019-11-12 VITALS — BP 108/64 | HR 67 | Temp 98.1°F | Ht 63.0 in | Wt 152.6 lb

## 2019-11-12 DIAGNOSIS — E039 Hypothyroidism, unspecified: Secondary | ICD-10-CM | POA: Diagnosis not present

## 2019-11-12 DIAGNOSIS — M810 Age-related osteoporosis without current pathological fracture: Secondary | ICD-10-CM | POA: Insufficient documentation

## 2019-11-12 DIAGNOSIS — L111 Transient acantholytic dermatosis [Grover]: Secondary | ICD-10-CM

## 2019-11-12 DIAGNOSIS — M8580 Other specified disorders of bone density and structure, unspecified site: Secondary | ICD-10-CM

## 2019-11-12 DIAGNOSIS — E2839 Other primary ovarian failure: Secondary | ICD-10-CM | POA: Diagnosis not present

## 2019-11-12 DIAGNOSIS — B001 Herpesviral vesicular dermatitis: Secondary | ICD-10-CM

## 2019-11-12 DIAGNOSIS — K635 Polyp of colon: Secondary | ICD-10-CM | POA: Insufficient documentation

## 2019-11-12 HISTORY — DX: Age-related osteoporosis without current pathological fracture: M81.0

## 2019-11-12 HISTORY — DX: Herpesviral vesicular dermatitis: B00.1

## 2019-11-12 HISTORY — DX: Transient acantholytic dermatosis (grover): L11.1

## 2019-11-12 MED ORDER — SHINGRIX 50 MCG/0.5ML IM SUSR: 0.5000 mL | Freq: Once | INTRAMUSCULAR | 0 refills | Status: AC

## 2019-11-12 MED ORDER — ACYCLOVIR 400 MG PO TABS: 400.0000 mg | ORAL_TABLET | Freq: Three times a day (TID) | ORAL | 6 refills | Status: DC

## 2019-11-12 NOTE — Patient Instructions (Signed)
Please return in May 2021 for your annual complete physical; please come fasting.  Medicare recommends an Annual Wellness Visit for all patients. Please schedule this to be done with our Nurse Educator, Loma Sousa. This is an informative "talk" visit; it's goals are to ensure that your health care needs are being met and to give you education regarding avoiding falls, ensuring you are not suffering from depression or problems with memory or thinking, and to educate you on Advance Care Planning. It helps me take good care of you!  I will release your lab results to you on your MyChart account with further instructions. Please reply with any questions.  Then I will the thyroid medication once the results are back next week.   Please take the prescription for Shingrix to the pharmacy so they may administer the vaccinations. Your insurance will then cover the injections.   If you have any questions or concerns, please don't hesitate to send me a message via MyChart or call the office at [phone]. Thank you for visiting with [place] today! It's our pleasure caring for you.  It was a pleasure meeting you!

## 2019-11-12 NOTE — Progress Notes (Signed)
Subjective  CC:  Chief Complaint  Patient presents with  . Hypothyroidism    HPI: Morgan [name] is a 68 y.o. female who presents to the office today to address the problems listed above in the chief complaint. TOC; new pt to me. Chart reviewed. Discussed chronic problems and updated PL.  [name] Hypothyroidism f/u: Morgan [name] is a 68 y.o. female who presents for follow up of hypothyroidism. Last TSH showed control was inadequate, and thyroid supplement medication was adjusted accordingly.  Current symptoms: weight changes . Patient denies change in energy level, diarrhea, heat / cold intolerance, nervousness and palpitations. Symptoms have gradually worsened.She has been compliant with the medication. Due for lab recheck.  Lab Results  Component Value Date   TSH 0.30 (L) 09/17/2019   I reviewed chart and TSH findings. OK in may, but then low in September x 2. Levothyroxine was decreased from 20mcg daily to 12mcg daily. She has been compliant. Overall feels better.    HM: due for mammo and dexa. Due shingrix.   Cold sores needs refills  Depression is stable. Rare valium use.   Assessment  1. Acquired hypothyroidism   2. Osteopenia, unspecified location   3. Recurrent cold sores   4. Grover's disease   5. Hypoestrogenism      Plan   Hypothyroidism:  recheck labs today and adjust meds accordingly. Pt is clinically euthyroid.  Refilled meds  Ordered mammo/dexa and AWV   Follow up: may 2021 for cpe  Visit date not found  Orders Placed This Encounter  Procedures  . Dexa BC  . TSH   Meds ordered this encounter  Medications  . acyclovir (ZOVIRAX) 400 MG tablet    Sig: Take 1 tablet (400 mg total) by mouth 3 (three) times daily. Take only as needed    Dispense:  15 tablet    Refill:  6  . Zoster Vaccine Adjuvanted [facility]) injection    Sig: Inject 0.5 mLs into the muscle once for 1 dose. Please give 2nd dose 2-6 months after first dose    Dispense:  2  each    Refill:  0      I reviewed the patients updated PMH, FH, and SocHx.    Patient Active Problem List   Diagnosis Date Noted  . OSA (obstructive sleep apnea), not using CPAP 07/04/2010    Priority: High  . Mixed hyperlipidemia 07/14/2007    Priority: High  . Acquired hypothyroidism 06/29/2007    Priority: High  . Osteopenia 11/12/2019    Priority: Medium  . Colon polyps 11/12/2019    Priority: Medium  . GAD (generalized anxiety disorder) 02/22/2014    Priority: Medium  . Diastolic dysfunction [id number]    Priority: Medium  . History of colon polyps 01/18/2008    Priority: Medium  . Osteoarthritis 06/29/2007    Priority: Medium  . 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 Mild mitral regurgitation by prior echocardiogram 07/24/2010    Priority: Low  . Allergic rhinitis 06/29/2007    Priority: Low   Current Meds  Medication Sig  . atorvastatin (LIPITOR) 10 MG tablet TAKE 1 TABLET DAILY  . azelastine (ASTELIN) 0.1 % nasal spray Place 2 sprays into both nostrils 2 (two) times daily.  . diazepam (VALIUM) 5 MG tablet Take 0.5-1 tablets (2.5-5 mg total) by mouth every 8 (eight) hours as needed for anxiety.  . DULoxetine (CYMBALTA) 30  MG capsule Take 1 capsule (30 mg total) by mouth daily.  [name] levothyroxine (SYNTHROID) 50 MCG tablet Take 1 tablet (50 mcg total) by mouth daily.  . [DISCONTINUED] acyclovir (ZOVIRAX) 400 MG tablet Take 1 tablet (400 mg total) by mouth 3 (three) times daily. Take only as needed    Allergies: Patient is allergic to simvastatin and sulfonamide derivatives. Family History: Patient family history includes Arthritis in an other family member; Brain cancer in her father; Breast cancer in her maternal aunt and paternal aunt; Diabetes in her brother, father, and paternal grandfather; Heart attack in her father; Heart disease in her father; Hyperlipidemia in her father; Macular degeneration in  her mother; Migraines in her daughter; Pulmonary embolism in her daughter. Social History:  Patient  reports that she has never smoked. She has never used smokeless tobacco. She reports current alcohol use. She reports that she does not use drugs.  Review of Systems: Constitutional: Negative for fever malaise or anorexia Cardiovascular: negative for chest pain Respiratory: negative for SOB or persistent cough Gastrointestinal: negative for abdominal pain  Objective  Vitals: BP 108/64 (BP Location: Left Arm, Patient Position: Sitting, Cuff Size: Normal)   Pulse 67   Temp 98.1 F (36.7 C) (Skin)   Ht 5\' 3"  (1.6 m)   Wt 152 lb 9.6 oz (69.2 kg)   SpO2 99%   BMI 27.03 kg/m  General: no acute distress , A&Ox3 HEENT: PEERL, no proptosis or lid lag, conjunctiva normal, Oropharynx moist,neck is supple without goiter or thyromegaly or thyroid nodules Cardiovascular:  RRR without murmur or gallop. No peripheral edema Respiratory:  Good breath sounds bilaterally, CTAB with normal respiratory effort Skin:  Warm, no rashes Neuro: no tremor     Commons side effects, risks, benefits, and alternatives for medications and treatment plan prescribed today were discussed, and the patient expressed understanding of the given instructions. Patient is instructed to call or message via MyChart if he/she has any questions or concerns regarding our treatment plan. No barriers to understanding were identified. We discussed Red Flag symptoms and signs in detail. Patient expressed understanding regarding what to do in case of urgent or emergency type symptoms.   Medication list was reconciled, printed and provided to the patient in AVS. Patient instructions and summary information was reviewed with the patient as documented in the AVS. This note was prepared with assistance of Dragon voice recognition software. Occasional wrong-word or sound-a-like substitutions may have occurred due to the inherent limitations  of voice recognition software

## 2019-11-13 LAB — TSH: TSH: 6.29 mIU/L — ABNORMAL HIGH (ref 0.40–4.50)

## 2019-11-15 ENCOUNTER — Other Ambulatory Visit: Payer: Self-pay

## 2019-11-15 DIAGNOSIS — E039 Hypothyroidism, unspecified: Secondary | ICD-10-CM

## 2019-11-15 MED ORDER — LEVOTHYROXINE SODIUM 75 MCG PO TABS: 75.0000 ug | ORAL_TABLET | Freq: Every day | ORAL | 3 refills | Status: DC

## 2019-11-16 ENCOUNTER — Encounter: Payer: Self-pay | Admitting: Family Medicine

## 2019-11-16 ENCOUNTER — Other Ambulatory Visit: Payer: Self-pay | Admitting: Family Medicine

## 2019-11-16 DIAGNOSIS — Z1231 Encounter for screening mammogram for malignant neoplasm of breast: Secondary | ICD-10-CM

## 2019-12-14 ENCOUNTER — Encounter: Payer: Self-pay | Admitting: Physician Assistant

## 2019-12-14 ENCOUNTER — Ambulatory Visit (INDEPENDENT_AMBULATORY_CARE_PROVIDER_SITE_OTHER): Payer: Medicare HMO | Admitting: Physician Assistant

## 2019-12-14 VITALS — Ht 63.0 in | Wt 150.0 lb

## 2019-12-14 DIAGNOSIS — Z7189 Other specified counseling: Secondary | ICD-10-CM | POA: Diagnosis not present

## 2019-12-14 DIAGNOSIS — R0981 Nasal congestion: Secondary | ICD-10-CM

## 2019-12-14 MED ORDER — DOXYCYCLINE HYCLATE 100 MG PO TABS: 100.0000 mg | ORAL_TABLET | Freq: Two times a day (BID) | ORAL | 0 refills | Status: DC

## 2019-12-14 NOTE — Progress Notes (Signed)
Virtual Visit via Video   I connected with Morgan [name] on 12/14/19 at 11:20 AM EST by a video enabled telemedicine application and verified that I am speaking with the correct person using two identifiers. Location patient: Home Location provider: [facility], Office Persons participating in the virtual visit: Alyea Barge, Inda Coke PA-C, Anselmo Pickler, LPN   I discussed the limitations of evaluation and management by telemedicine and the availability of in person appointments. The patient expressed understanding and agreed to proceed.  Subjective:   HPI:   Sinus problem Pt c/o bad headaches last Tues. Thought that maybe she had gotten into some MSG as that can typically cause her to have bad HA. Having facial pain and pressure above her eyes. HA and pressure has been persistent daily, slightly better yesterday and today. Having nasal congestion, post nasal drip. Denies fever, chills, cough, loss taste/smell, diarrhea, abdominal pain, body aches, chest pain, SOB. Does endorse fatigue. Eating and drinking well.  Pt has tried numerous OTC medications including: tylenol, ibuprofen, excedrin migraine, sudafed, mucinex, astelin nasal spray.  Has not been around anyone with COVID-19 that she is aware of.   ROS: See pertinent positives and negatives per HPI.  Patient Active Problem List   Diagnosis Date Noted  . Osteopenia 11/12/2019  . Colon polyps 11/12/2019  . 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 GAD (generalized anxiety disorder) 02/22/2014  . Mild mitral regurgitation by prior echocardiogram 07/24/2010  . OSA (obstructive sleep apnea), not using CPAP 07/04/2010  . Diastolic dysfunction [id number]  . History of colon polyps 01/18/2008  . Mixed hyperlipidemia 07/14/2007  . Acquired hypothyroidism 06/29/2007  . Allergic rhinitis 06/29/2007  . Osteoarthritis 06/29/2007    Social History   Tobacco Use  . Smoking status:  Never Smoker  . Smokeless tobacco: Never Used  Substance Use Topics  . Alcohol use: Yes    Comment: rare    Current Outpatient Medications:  .  acyclovir (ZOVIRAX) 400 MG tablet, Take 1 tablet (400 mg total) by mouth 3 (three) times daily. Take only as needed, Disp: 15 tablet, Rfl: 6 .  atorvastatin (LIPITOR) 10 MG tablet, TAKE 1 TABLET DAILY, Disp: 90 tablet, Rfl: 3 .  azelastine (ASTELIN) 0.1 % nasal spray, Place 2 sprays into both nostrils 2 (two) times daily., Disp: 30 mL, Rfl: 12 .  diazepam (VALIUM) 5 MG tablet, Take 0.5-1 tablets (2.5-5 mg total) by mouth every 8 (eight) hours as needed for anxiety., Disp: 30 tablet, Rfl: 1 .  DULoxetine (CYMBALTA) 30 MG capsule, Take 1 capsule (30 mg total) by mouth daily., Disp: 90 capsule, Rfl: 0 .  levothyroxine (SYNTHROID) 75 MCG tablet, Take 1 tablet (75 mcg total) by mouth daily., Disp: 90 tablet, Rfl: 3 .  doxycycline (VIBRA-TABS) 100 MG tablet, Take 1 tablet (100 mg total) by mouth 2 (two) times daily., Disp: 20 tablet, Rfl: 0  Allergies  Allergen Reactions  . Simvastatin Other (See Comments)    Myalgia  . Sulfonamide Derivatives Rash    Objective:   VITALS: Per patient if applicable, see vitals. GENERAL: Alert, appears well and in no acute distress. HEENT: Atraumatic, conjunctiva clear, no obvious abnormalities on inspection of external nose and ears. NECK: Normal movements of the head and neck. CARDIOPULMONARY: No increased WOB. Speaking in clear sentences. I:E ratio WNL.  MS: Moves all visible extremities without noticeable abnormality. PSYCH: Pleasant and cooperative, well-groomed. Speech normal rate and rhythm. Affect is appropriate. Insight and judgement  are appropriate. Attention is focused, linear, and appropriate.  NEURO: CN grossly intact. Oriented as arrived to appointment on time with no prompting. Moves both UE equally.  SKIN: No obvious lesions, wounds, erythema, or cyanosis noted on face or hands.  Assessment and  Plan:   Toyya was seen today for sinus problem.  Diagnoses and all orders for this visit:  Sinus congestion -     Novel Coronavirus, NAA ([facility])  Advice given about COVID-19 virus infection  Other orders -     doxycycline (VIBRA-TABS) 100 MG tablet; Take 1 tablet (100 mg total) by mouth 2 (two) times daily.   No red flags on exam.  Will initiate doxycycline for possible sinusitis per orders. Discussed taking medications as prescribed. Reviewed return precautions including worsening fever, SOB, worsening cough or other concerns. Push fluids and rest. I recommend that patient follow-up if symptoms worsen or persist despite treatment x 7-10 days, sooner if needed.  We are going to send patient for testing. As a precaution, they have been advised to remain home until COVID-19 results and then possible further quarantine after that based on results and symptoms. Advised if they experience a "second sickening" or worsening symptoms as the illness progresses, they are to call the office for further instructions or seek emergent evaluation for any severe symptoms.    . Reviewed expectations re: course of current medical issues. . Discussed self-management of symptoms. . Outlined signs and symptoms indicating need for more acute intervention. . Patient verbalized understanding and all questions were answered. [name] Health Maintenance issues including appropriate healthy diet, exercise, and smoking avoidance were discussed with patient. . See orders for this visit as documented in the electronic medical record.  I discussed the assessment and treatment plan with the patient. The patient was provided an opportunity to ask questions and all were answered. The patient agreed with the plan and demonstrated an understanding of the instructions.   The patient was advised to call back or seek an in-person evaluation if the symptoms worsen or if the condition fails to improve as anticipated.   CMA or LPN  served as scribe during this visit. History, Physical, and Plan performed by medical provider. The above documentation has been reviewed and is accurate and complete.   [place], [place] 12/14/2019

## 2019-12-20 ENCOUNTER — Ambulatory Visit: Payer: Medicare HMO | Attending: Internal Medicine

## 2019-12-20 DIAGNOSIS — Z20822 Contact with and (suspected) exposure to covid-19: Secondary | ICD-10-CM

## 2019-12-20 DIAGNOSIS — Z20828 Contact with and (suspected) exposure to other viral communicable diseases: Secondary | ICD-10-CM | POA: Diagnosis not present

## 2019-12-21 ENCOUNTER — Encounter: Payer: Self-pay | Admitting: Family Medicine

## 2019-12-21 ENCOUNTER — Ambulatory Visit
Admission: RE | Admit: 2019-12-21 | Discharge: 2019-12-21 | Disposition: A | Discharge status: Home / Self Care | Payer: Medicare HMO | Source: Ambulatory Visit | Attending: Family Medicine | Admitting: Family Medicine

## 2019-12-21 ENCOUNTER — Other Ambulatory Visit: Payer: Self-pay

## 2019-12-21 DIAGNOSIS — E2839 Other primary ovarian failure: Secondary | ICD-10-CM

## 2019-12-21 DIAGNOSIS — M8588 Other specified disorders of bone density and structure, other site: Secondary | ICD-10-CM | POA: Diagnosis not present

## 2019-12-21 DIAGNOSIS — M810 Age-related osteoporosis without current pathological fracture: Secondary | ICD-10-CM | POA: Diagnosis not present

## 2019-12-21 DIAGNOSIS — Z780 Asymptomatic menopausal state: Secondary | ICD-10-CM | POA: Diagnosis not present

## 2019-12-21 DIAGNOSIS — M8580 Other specified disorders of bone density and structure, unspecified site: Secondary | ICD-10-CM

## 2019-12-21 LAB — NOVEL CORONAVIRUS, NAA: SARS-CoV-2, NAA: NOT DETECTED

## 2019-12-28 ENCOUNTER — Other Ambulatory Visit: Payer: Self-pay | Admitting: Family Medicine

## 2019-12-28 DIAGNOSIS — F32A Depression, unspecified: Secondary | ICD-10-CM

## 2019-12-28 DIAGNOSIS — F329 Major depressive disorder, single episode, unspecified: Secondary | ICD-10-CM

## 2019-12-28 DIAGNOSIS — F419 Anxiety disorder, unspecified: Secondary | ICD-10-CM

## 2020-01-10 ENCOUNTER — Ambulatory Visit
Admission: RE | Admit: 2020-01-10 | Discharge: 2020-01-10 | Disposition: A | Discharge status: Home / Self Care | Payer: Medicare HMO | Source: Ambulatory Visit | Attending: Family Medicine | Admitting: Family Medicine

## 2020-01-10 ENCOUNTER — Other Ambulatory Visit: Payer: Self-pay

## 2020-01-10 DIAGNOSIS — Z1231 Encounter for screening mammogram for malignant neoplasm of breast: Secondary | ICD-10-CM | POA: Diagnosis not present

## 2020-01-11 ENCOUNTER — Other Ambulatory Visit (INDEPENDENT_AMBULATORY_CARE_PROVIDER_SITE_OTHER): Payer: Medicare HMO

## 2020-01-11 ENCOUNTER — Ambulatory Visit (INDEPENDENT_AMBULATORY_CARE_PROVIDER_SITE_OTHER): Payer: Medicare HMO

## 2020-01-11 VITALS — BP 130/68 | HR 74 | Temp 97.8°F | Ht 63.0 in | Wt 152.8 lb

## 2020-01-11 DIAGNOSIS — Z0000 Encounter for general adult medical examination without abnormal findings: Secondary | ICD-10-CM

## 2020-01-11 DIAGNOSIS — E039 Hypothyroidism, unspecified: Secondary | ICD-10-CM | POA: Diagnosis not present

## 2020-01-11 LAB — TSH: TSH: 1.12 u[IU]/mL (ref 0.35–4.50)

## 2020-01-11 NOTE — Patient Instructions (Addendum)
Morgan [name] , Thank you for taking time to come for your Medicare Wellness Visit. I appreciate your ongoing commitment to your health goals. Please review the following plan we discussed and let me know if I can assist you in the future.   Screening recommendations/referrals: Colorectal Screening: up to date; last colonoscopy 10/16/17 with Dr. Fuller Plan  Mammogram: up to date; last 01/10/20 Bone Density: up to date; last 12/21/19  Vision and Dental Exams: Recommended annual ophthalmology exams for early detection of glaucoma and other disorders of the eye Recommended annual dental exams for proper oral hygiene  Vaccinations: Influenza vaccine: completed 09/03/19 Pneumococcal vaccine: up to date; last 03/16/15 Tdap vaccine: up to date; last 01/19/16 Shingles vaccine: # 1 in Shingrix series received 01/03/20  Advanced directives: Please bring a copy of your POA (Power of [place]) and/or Living Will to your next appointment.  Goals: Recommend to drink at least 6-8 8oz glasses of water per day and consume a balanced diet rich in fresh fruits and vegetables.   Next appointment: Please schedule your Annual Wellness Visit with your Nurse Health Advisor in one year.  Preventive Care 35 Years and Older, Female Preventive care refers to lifestyle choices and visits with your health care provider that can promote health and wellness. What does preventive care include?  A yearly physical exam. This is also called an annual well check.  Dental exams once or twice a year.  Routine eye exams. Ask your health care provider how often you should have your eyes checked.  Personal lifestyle choices, including:  Daily care of your teeth and gums.  Regular physical activity.  Eating a healthy diet.  Avoiding tobacco and drug use.  Limiting alcohol use.  Practicing safe sex.  Taking low-dose aspirin every day if recommended by your health care provider.  Taking vitamin and mineral supplements as  recommended by your health care provider. What happens during an annual well check? The services and screenings done by your health care provider during your annual well check will depend on your age, overall health, lifestyle risk factors, and family history of disease. Counseling  Your health care provider may ask you questions about your:  Alcohol use.  Tobacco use.  Drug use.  Emotional well-being.  Home and relationship well-being.  Sexual activity.  Eating habits.  History of falls.  Memory and ability to understand (cognition).  Work and work Statistician.  Reproductive health. Screening  You may have the following tests or measurements:  Height, weight, and BMI.  Blood pressure.  Lipid and cholesterol levels. These may be checked every 5 years, or more frequently if you are over 53 years old.  Skin check.  Lung cancer screening. You may have this screening every year starting at age 3 if you have a 30-pack-year history of smoking and currently smoke or have quit within the past 15 years.  Fecal occult blood test (FOBT) of the stool. You may have this test every year starting at age 23.  Flexible sigmoidoscopy or colonoscopy. You may have a sigmoidoscopy every 5 years or a colonoscopy every 10 years starting at age 51.  Hepatitis C blood test.  Hepatitis B blood test.  Sexually transmitted disease (STD) testing.  Diabetes screening. This is done by checking your blood sugar (glucose) after you have not eaten for a while (fasting). You may have this done every 1-3 years.  Bone density scan. This is done to screen for osteoporosis. You may have this done starting at age  65.  Mammogram. This may be done every 1-2 years. Talk to your health care provider about how often you should have regular mammograms. Talk with your health care provider about your test results, treatment options, and if necessary, the need for more tests. Vaccines  Your health care  provider may recommend certain vaccines, such as:  Influenza vaccine. This is recommended every year.  Tetanus, diphtheria, and acellular pertussis (Tdap, Td) vaccine. You may need a Td booster every 10 years.  Zoster vaccine. You may need this after age 50.  Pneumococcal 13-valent conjugate (PCV13) vaccine. One dose is recommended after age 33.  Pneumococcal polysaccharide (PPSV23) vaccine. One dose is recommended after age 73. Talk to your health care provider about which screenings and vaccines you need and how often you need them. This information is not intended to replace advice given to you by your health care provider. Make sure you discuss any questions you have with your health care provider. Document Released: 01/12/2016 Document Revised: 09/04/2016 Document Reviewed: 10/17/2015 Elsevier Interactive Patient Education  2017 [place] Prevention in the Home Falls can cause injuries. They can happen to people of all ages. There are many things you can do to make your home safe and to help prevent falls. What can I do on the outside of my home?  Regularly fix the edges of walkways and driveways and fix any cracks.  Remove anything that might make you trip as you walk through a door, such as a raised step or threshold.  Trim any bushes or trees on the path to your home.  Use bright outdoor lighting.  Clear any walking paths of anything that might make someone trip, such as rocks or tools.  Regularly check to see if handrails are loose or broken. Make sure that both sides of any steps have handrails.  Any raised decks and porches should have guardrails on the edges.  Have any leaves, snow, or ice cleared regularly.  Use sand or salt on walking paths during winter.  Clean up any spills in your garage right away. This includes oil or grease spills. What can I do in the bathroom?  Use night lights.  Install grab bars by the toilet and in the tub and shower. Do  not use towel bars as grab bars.  Use non-skid mats or decals in the tub or shower.  If you need to sit down in the shower, use a plastic, non-slip stool.  Keep the floor dry. Clean up any water that spills on the floor as soon as it happens.  Remove soap buildup in the tub or shower regularly.  Attach bath mats securely with double-sided non-slip rug tape.  Do not have throw rugs and other things on the floor that can make you trip. What can I do in the bedroom?  Use night lights.  Make sure that you have a light by your bed that is easy to reach.  Do not use any sheets or blankets that are too big for your bed. They should not hang down onto the floor.  Have a firm chair that has side arms. You can use this for support while you get dressed.  Do not have throw rugs and other things on the floor that can make you trip. What can I do in the kitchen?  Clean up any spills right away.  Avoid walking on wet floors.  Keep items that you use a lot in easy-to-reach places.  If you need  to reach something above you, use a strong step stool that has a grab bar.  Keep electrical cords out of the way.  Do not use floor polish or wax that makes floors slippery. If you must use wax, use non-skid floor wax.  Do not have throw rugs and other things on the floor that can make you trip. What can I do with my stairs?  Do not leave any items on the stairs.  Make sure that there are handrails on both sides of the stairs and use them. Fix handrails that are broken or loose. Make sure that handrails are as long as the stairways.  Check any carpeting to make sure that it is firmly attached to the stairs. Fix any carpet that is loose or worn.  Avoid having throw rugs at the top or bottom of the stairs. If you do have throw rugs, attach them to the floor with carpet tape.  Make sure that you have a light switch at the top of the stairs and the bottom of the stairs. If you do not have them,  ask someone to add them for you. What else can I do to help prevent falls?  Wear shoes that:  Do not have high heels.  Have rubber bottoms.  Are comfortable and fit you well.  Are closed at the toe. Do not wear sandals.  If you use a stepladder:  Make sure that it is fully opened. Do not climb a closed stepladder.  Make sure that both sides of the stepladder are locked into place.  Ask someone to hold it for you, if possible.  Clearly mark and make sure that you can see:  Any grab bars or handrails.  First and last steps.  Where the edge of each step is.  Use tools that help you move around (mobility aids) if they are needed. These include:  Canes.  Walkers.  Scooters.  Crutches.  Turn on the lights when you go into a dark area. Replace any light bulbs as soon as they burn out.  Set up your furniture so you have a clear path. Avoid moving your furniture around.  If any of your floors are uneven, fix them.  If there are any pets around you, be aware of where they are.  Review your medicines with your doctor. Some medicines can make you feel dizzy. This can increase your chance of falling. Ask your doctor what other things that you can do to help prevent falls. This information is not intended to replace advice given to you by your health care provider. Make sure you discuss any questions you have with your health care provider. Document Released: 10/12/2009 Document Revised: 05/23/2016 Document Reviewed: 01/20/2015 Elsevier Interactive Patient Education  2017 [employer].

## 2020-01-11 NOTE — Progress Notes (Signed)
Subjective:   Morgan [name] is a 69 y.o. female who presents for Medicare Annual (Subsequent) preventive examination.  Review of Systems:   Cardiac Risk Factors include: advanced age (>81men, >2 women);dyslipidemia    Objective:     Vitals: There were no vitals taken for this visit.  There is no height or weight on file to calculate BMI.  Advanced Directives 01/11/2020 10/02/2017 03/16/2015 11/30/2012 11/20/2012  Does Patient Have a Medical Advance Directive? Yes No Yes Patient has advance directive, copy not in chart Patient has advance directive, copy not in chart  Type of Advance Directive [place];Living will - [place];Living will - [place];Living will  Does patient want to make changes to medical advance directive? No - Patient declined - Yes - information given - -  Copy of [place] in Chart? No - copy requested - No - copy requested - Copy requested from family  Pre-existing out of facility DNR order (yellow form or pink MOST form) - - - No -    Tobacco Social History   Tobacco Use  Smoking Status Never Smoker  Smokeless Tobacco Never Used     Counseling given: Not Answered   Clinical Intake:  Pre-visit preparation completed: Yes  Pain : No/denies pain  Diabetes: No  How often do you need to have someone help you when you read instructions, pamphlets, or other written materials from your doctor or pharmacy?: 1 - Never  Interpreter Needed?: No  Information entered by :: Denman George LPN  Past Medical History:  Diagnosis Date  . Allergy   . Anxiety and depression 02/22/2014  . Arthritis   . Depression   . Diastolic dysfunction    Negative Nuclear Stress Test 08/09/12.  [name] Elevated cholesterol   . 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 History of colonic polyps   . Hypothyroidism   . Mitral regurgitation   . OSA (obstructive sleep apnea)    Mild. No CPAP.  [name]  Osteoarthritis 06/29/2007  . Osteopenia   . 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 Seasonal allergies   . Sleep apnea    mild not on CPAP  . Tinnitus    Past Surgical History:  Procedure Laterality Date  . COLONOSCOPY    . POLYPECTOMY     Colon  . REPLACEMENT TOTAL KNEE Right   . TOTAL KNEE ARTHROPLASTY Right 04/22/2007  . TOTAL KNEE ARTHROPLASTY  11/30/2012   Procedure: TOTAL KNEE ARTHROPLASTY;  Surgeon: Gearlean Alf, MD;  Location: [facility];  Service: Orthopedics;  Laterality: Left;   Family History  Problem Relation Age of Onset  . Heart attack Father   . Hyperlipidemia Father   . Heart disease Father   . Diabetes Father   . Brain cancer Father   . Macular degeneration Mother   . Diabetes Brother   . Migraines Daughter   . Pulmonary embolism Daughter   . Diabetes Paternal Grandfather   . Arthritis Other   . Breast cancer Maternal Aunt   . Breast cancer Paternal Aunt   . Colon polyps Neg Hx   . Colon cancer Neg Hx   . Esophageal cancer Neg Hx   . Rectal cancer Neg Hx   . Stomach cancer Neg Hx    Social History   Socioeconomic History  . Marital status: Widowed    Spouse name: Not on file  . Number of children:  Not on file  . Years of education: Not on file  . Highest education level: Not on file  Occupational History  . Not on file  Tobacco Use  . Smoking status: Never Smoker  . Smokeless tobacco: Never Used  Substance and Sexual Activity  . Alcohol use: Yes    Comment: rare  . Drug use: No  . 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 No major dietary restrictions  Other Topics Concern  . Not on file  Social History Narrative  . Not on file   Social Determinants of Health   Financial Resource Strain:   . Difficulty of Paying Living Expenses: Not on file  Food Insecurity:   . Worried About Charity fundraiser in the Last Year: Not on file  . Ran  Out of Food in the Last Year: Not on file  Transportation Needs:   . Lack of Transportation (Medical): Not on file  . Lack of Transportation (Non-Medical): Not on file  Physical Activity:   . Days of Exercise per Week: Not on file  . Minutes of Exercise per Session: Not on file  Stress:   . Feeling of Stress : Not on file  Social Connections:   . Frequency of Communication with Friends and Family: Not on file  . Frequency of Social Gatherings with Friends and Family: Not on file  . Attends Religious Services: Not on file  . Active Member of Clubs or Organizations: Not on file  . Attends Archivist Meetings: Not on file  . Marital Status: Not on file    Outpatient Encounter Medications as of 01/11/2020  Medication Sig  . acyclovir (ZOVIRAX) 400 MG tablet Take 1 tablet (400 mg total) by mouth 3 (three) times daily. Take only as needed  . atorvastatin (LIPITOR) 10 MG tablet TAKE 1 TABLET DAILY  . azelastine (ASTELIN) 0.1 % nasal spray Place 2 sprays into both nostrils 2 (two) times daily.  . Calcium-Magnesium-Vitamin D (CALCIUM 1200+D3 PO) Take by mouth.  . diazepam (VALIUM) 5 MG tablet Take 0.5-1 tablets (2.5-5 mg total) by mouth every 8 (eight) hours as needed for anxiety.  . DULoxetine (CYMBALTA) 30 MG capsule TAKE ONE CAPSULE BY MOUTH ONE TIME DAILY   . levothyroxine (SYNTHROID) 75 MCG tablet Take 1 tablet (75 mcg total) by mouth daily.  . [DISCONTINUED] doxycycline (VIBRA-TABS) 100 MG tablet Take 1 tablet (100 mg total) by mouth 2 (two) times daily.   No facility-administered encounter medications on file as of 01/11/2020.    Activities of Daily Living In your present state of health, do you have any difficulty performing the following activities: 01/11/2020 09/09/2019  Hearing? N Y  Comment - ringing in both ears.  Vision? N N  Difficulty concentrating or making decisions? N N  Walking or climbing stairs? N N  Dressing or bathing? N N  Doing errands, shopping? N N   Preparing Food and eating ? N -  Using the Toilet? N -  In the past six months, have you accidently leaked urine? N -  Do you have problems with loss of bowel control? N -  Managing your Medications? N -  Managing your Finances? N -  Housekeeping or managing your Housekeeping? N -  Some recent data might be hidden    Patient Care Team: Leamon Arnt, MD as PCP - General (Family Medicine) Ladene Artist, MD as Consulting Physician (Gastroenterology) Harriett Sine, MD  as Consulting Physician (Dermatology) Dyke Maes, OD as Consulting Physician (Optometry)    Assessment:   This is a routine wellness examination for [facility].  Exercise Activities and Dietary recommendations Current Exercise Habits: The patient does not participate in regular exercise at present  Goals   None     Fall Risk Fall Risk  01/11/2020 11/12/2019 09/14/2018 09/15/2017 07/22/2016  Falls in the past year? 0 1 No No No  Number falls in past yr: 0 0 - - -  Injury with Fall? 0 0 - - -  Follow up Falls evaluation completed;Falls prevention discussed;Education provided - - - -   Is the patient's home free of loose throw rugs in walkways, pet beds, electrical cords, etc?   yes      Grab bars in the bathroom? yes      Handrails on the stairs?   yes      Adequate lighting?   yes  Timed Get Up and Go performed: completed and within normal timeframe; no gait abnormalities noted    Depression Screen PHQ 2/9 Scores 01/11/2020 11/12/2019 09/09/2019 09/14/2018  PHQ - 2 Score 0 0 2 2  PHQ- 9 Score - - 6 8     Cognitive Function- no cognitive concerns at this time    6CIT Screen 01/11/2020  What Year? 0 points  What month? 0 points  What time? 0 points  Count back from 20 0 points  Months in reverse 0 points  Repeat phrase 0 points  Total Score 0    Immunization History  Administered Date(s) Administered  . Fluad Quad(high Dose 65+) 09/03/2019  . Influenza Split 11/29/2011, 10/23/2012  . Influenza  Whole 09/30/2008, 11/10/2010  . Influenza, High Dose Seasonal PF 09/15/2017, 09/14/2018  . Influenza,inj,Quad PF,6+ Mos 10/01/2013, 09/13/2014, 10/03/2015, 10/30/2016  . Pneumococcal Conjugate-13 10/22/2013  . Pneumococcal Polysaccharide-23 03/16/2015  . Td 12/30/2005, 01/19/2016  . Zoster 08/02/2011  . Zoster Recombinat (Shingrix) 01/03/2020    Qualifies for Shingles Vaccine? #1 in series completed 01/03/20   Screening Tests Health Maintenance  Topic Date Due  . MAMMOGRAM  01/09/2021  . DEXA SCAN  12/20/2021  . COLONOSCOPY  10/16/2022  . TETANUS/TDAP  01/18/2026  . INFLUENZA VACCINE  Completed  . Hepatitis C Screening  Completed  . PNA vac Low Risk Adult  Completed    Cancer Screenings: Lung: Low Dose CT Chest recommended if Age 59-80 years, 30 pack-year currently smoking OR have quit w/in 15years. Patient does not qualify. Breast:  Up to date on Mammogram? Yes   Up to date of Bone Density/Dexa? Yes Colorectal: colonoscopy 10/16/17    Plan:  I have personally reviewed and addressed the Medicare Annual Wellness questionnaire and have noted the following in the patient's chart:  A. Medical and social history B. Use of alcohol, tobacco or illicit drugs  C. Current medications and supplements D. Functional ability and status E.  Nutritional status F.  Physical activity G. Advance directives H. List of other physicians I.  Hospitalizations, surgeries, and ER visits in previous 12 months J.  [place] such as hearing and vision if needed, cognitive and depression L. Referrals, records requested, and appointments- none   In addition, I have reviewed and discussed with patient certain preventive protocols, quality metrics, and best practice recommendations. A written personalized care plan for preventive services as well as general preventive health recommendations were provided to patient.   Signed,  Denman George, LPN  Nurse Health Advisor   Nurse Notes: no  additional

## 2020-01-18 ENCOUNTER — Other Ambulatory Visit: Payer: Self-pay

## 2020-01-19 ENCOUNTER — Ambulatory Visit (INDEPENDENT_AMBULATORY_CARE_PROVIDER_SITE_OTHER): Payer: Medicare HMO | Admitting: Family Medicine

## 2020-01-19 ENCOUNTER — Encounter: Payer: Self-pay | Admitting: Family Medicine

## 2020-01-19 VITALS — BP 138/82 | HR 72 | Temp 97.2°F | Ht 63.0 in | Wt 152.6 lb

## 2020-01-19 DIAGNOSIS — F411 Generalized anxiety disorder: Secondary | ICD-10-CM | POA: Diagnosis not present

## 2020-01-19 DIAGNOSIS — M810 Age-related osteoporosis without current pathological fracture: Secondary | ICD-10-CM | POA: Diagnosis not present

## 2020-01-19 DIAGNOSIS — R69 Illness, unspecified: Secondary | ICD-10-CM | POA: Diagnosis not present

## 2020-01-19 DIAGNOSIS — E039 Hypothyroidism, unspecified: Secondary | ICD-10-CM | POA: Diagnosis not present

## 2020-01-19 MED ORDER — ALENDRONATE SODIUM 70 MG PO TABS: 70.0000 mg | ORAL_TABLET | ORAL | 11 refills | Status: DC

## 2020-01-19 NOTE — Progress Notes (Signed)
Subjective  CC:  Chief Complaint  Patient presents with  . Discuss bone density results  . Osteoporosis  . Hypothyroidism  . Anxiety    HPI: Morgan [name] is a 69 y.o. female who presents to the office today to address the problems listed above in the chief complaint.  Osteoporosis, new diagnosis: We reviewed her most recent bone density results compared to prior.  Now with a T score of -2.6 at the right femur, other values remain in the osteopenia category.  She has never before been treated for osteoporosis.  She had been exercising regularly.  She just taking regular calcium and vitamin D.  Her mother did have osteoporosis but had no fractures or complications.  She has no other contributory factors.  No history of peptic ulcer disease or recurrent gastritis.  She is active and healthy  Hypothyroidism: We reevaluated her most recent TSH levels.  Now in the normal range on 75 mcg daily of Synthroid.  However she starting to feel more shaky.  No palpitations or sweats.  No weight changes. Lab Results  Component Value Date   TSH 1.12 01/11/2020    Anxiety: She reports that this is well controlled on Cymbalta. Assessment  1. Osteoporosis, post-menopausal   2. Acquired hypothyroidism   3. GAD (generalized anxiety disorder)      Plan   Osteoporosis: New diagnosis, counseling education done.  Discussed options regarding waiting and watching versus starting treatment.  Both elect starting Fosamax weekly.  Discussed expectations and possible side effects.  Discussed appropriate use.  Continue vitamin D, calcium and weightbearing exercising.  Will recheck bone density in 2 years.  Hypothyroidism: We will recheck levels 8 weeks from last given her symptoms of jitteriness.  Today her exam is normal.  Continue current 75 mcg daily  General anxiety disorder: Reports well controlled.  Follow up: Lab visit on March 12 for TSH, CPE in May. Visit date not found  Orders Placed This  Encounter  Procedures  . TSH   Meds ordered this encounter  Medications  . alendronate (FOSAMAX) 70 MG tablet    Sig: Take 1 tablet (70 mg total) by mouth every 7 (seven) days. Take with a full glass of water on an empty stomach.    Dispense:  4 tablet    Refill:  11      I reviewed the patients updated PMH, FH, and SocHx.    Patient Active Problem List   Diagnosis Date Noted  . OSA (obstructive sleep apnea), not using CPAP 07/04/2010    Priority: High  . Mixed hyperlipidemia 07/14/2007    Priority: High  . Acquired hypothyroidism 06/29/2007    Priority: High  . 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 Colon polyps 11/12/2019    Priority: Medium  . GAD (generalized anxiety disorder) 02/22/2014    Priority: Medium  . Diastolic dysfunction [id number]    Priority: Medium  . History of colon polyps 01/18/2008    Priority: Medium  . Osteoarthritis 06/29/2007    Priority: Medium  . 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 Mild mitral regurgitation by prior echocardiogram 07/24/2010    Priority: Low  . Allergic rhinitis 06/29/2007    Priority: Low   Current Meds  Medication Sig  . acyclovir (ZOVIRAX) 400 MG tablet Take 1 tablet (400 mg total) by mouth 3 (three) times daily. Take only as needed  .  atorvastatin (LIPITOR) 10 MG tablet TAKE 1 TABLET DAILY  . azelastine (ASTELIN) 0.1 % nasal spray Place 2 sprays into both nostrils 2 (two) times daily.  . Calcium-Magnesium-Vitamin D (CALCIUM 1200+D3 PO) Take by mouth.  . diazepam (VALIUM) 5 MG tablet Take 0.5-1 tablets (2.5-5 mg total) by mouth every 8 (eight) hours as needed for anxiety.  . DULoxetine (CYMBALTA) 30 MG capsule TAKE ONE CAPSULE BY MOUTH ONE TIME DAILY   . levothyroxine (SYNTHROID) 75 MCG tablet Take 1 tablet (75 mcg total) by mouth daily.    Allergies: Patient is allergic to simvastatin and sulfonamide derivatives. Family  History: Patient family history includes Arthritis in an other family member; Brain cancer in her father; Breast cancer in her maternal aunt and paternal aunt; Diabetes in her brother, father, and paternal grandfather; Heart attack in her father; Heart disease in her father; Hyperlipidemia in her father; Macular degeneration in her mother; Migraines in her daughter; Pulmonary embolism in her daughter. Social History:  Patient  reports that she has never smoked. She has never used smokeless tobacco. She reports current alcohol use. She reports that she does not use drugs.  Review of Systems: Constitutional: Negative for fever malaise or anorexia Cardiovascular: negative for chest pain Respiratory: negative for SOB or persistent cough Gastrointestinal: negative for abdominal pain  Objective  Vitals: BP 138/82 (BP Location: Right Arm, Patient Position: Sitting, Cuff Size: Normal)   Pulse 72   Temp (!) 97.2 F (36.2 C) (Temporal)   Ht 5\' 3"  (1.6 m)   Wt 152 lb 9.6 oz (69.2 kg)   SpO2 99%   BMI 27.03 kg/m  General: no acute distress , A&Ox3 HEENT: [place]ER[place], conjunctiva normal,  Cardiovascular:  RRR without murmur or gallop.  Respiratory:  Good breath sounds bilaterally, CTAB with normal respiratory effort Skin:  Warm, no rashes Neuro no tremor     Commons side effects, risks, benefits, and alternatives for medications and treatment plan prescribed today were discussed, and the patient expressed understanding of the given instructions. Patient is instructed to call or message via MyChart if he/she has any questions or concerns regarding our treatment plan. No barriers to understanding were identified. We discussed Red Flag symptoms and signs in detail. Patient expressed understanding regarding what to do in case of urgent or emergency type symptoms.   Medication list was reconciled, printed and provided to the patient in AVS. Patient instructions and summary information was reviewed with the  patient as documented in the AVS. This note was prepared with assistance of Dragon voice recognition software. Occasional wrong-word or sound-a-like substitutions may have occurred due to the inherent limitations of voice recognition software  This visit occurred during the SARS-CoV-2 public health emergency.  Safety protocols were in place, including screening questions prior to the visit, additional usage of staff PPE, and extensive cleaning of exam room while observing appropriate contact time as indicated for disinfecting solutions.

## 2020-01-19 NOTE — Patient Instructions (Signed)
Please return around march 12th for a lab visit to recheck your thyroid levels.  Also, please schedule your annual physical for May 2021; please come fasting for that appointment.   If you have any questions or concerns, please don't hesitate to send me a message via MyChart or call the office at [phone]. Thank you for visiting with [place] today! It's our pleasure caring for you.   Osteoporosis  Osteoporosis is thinning and loss of density in your bones. Osteoporosis makes bones more brittle and fragile and more likely to break (fracture). Over time, osteoporosis can cause your bones to become so weak that they fracture after a minor fall. Bones in the hip, wrist, and spine are most likely to fracture due to osteoporosis. What are the causes? The exact cause of this condition is not known. What increases the risk? You may be at greater risk for osteoporosis if you:  Have a family history of the condition.  Have poor nutrition.  Use steroid medicines, such as prednisone.  Are female.  Are age 1 or older.  Smoke or have a history of smoking.  Are not physically active (are sedentary).  Are white (Caucasian) or of Asian descent.  Have a small body frame.  Take certain medicines, such as antiseizure medicines. What are the signs or symptoms? A fracture might be the first sign of osteoporosis, especially if the fracture results from a fall or injury that usually would not cause a bone to break. Other signs and symptoms include:  Pain in the neck or low back.  Stooped posture.  Loss of height. How is this diagnosed? This condition may be diagnosed based on:  Your medical history.  A physical exam.  A bone mineral density test, also called a DXA or DEXA test (dual-energy X-ray absorptiometry test). This test uses X-rays to measure the amount of minerals in your bones. How is this treated? The goal of treatment is to strengthen your bones and lower your risk for a  fracture. Treatment may involve:  Making lifestyle changes, such as: ? Including foods with more calcium and vitamin D in your diet. ? Doing weight-bearing and muscle-strengthening exercises. ? Stopping tobacco use. ? Limiting alcohol intake.  Taking medicine to slow the process of bone loss or to increase bone density.  Taking daily supplements of calcium and vitamin D.  Taking hormone replacement medicines, such as estrogen for women and testosterone for men.  Monitoring your levels of calcium and vitamin D. Follow these instructions at home:  Activity  Exercise as told by your health care provider. Ask your health care provider what exercises and activities are safe for you. You should do: ? Exercises that make you work against gravity (weight-bearing exercises), such as tai chi, yoga, or walking. ? Exercises to strengthen muscles, such as lifting weights. Lifestyle  Limit alcohol intake to no more than 1 drink a day for nonpregnant women and 2 drinks a day for men. One drink equals 12 oz of beer, 5 oz of wine, or 1 oz of hard liquor.  Do not use any products that contain nicotine or tobacco, such as cigarettes and e-cigarettes. If you need help quitting, ask your health care provider. Preventing falls  Use devices to help you move around (mobility aids) as needed, such as canes, walkers, scooters, or crutches.  Keep rooms well-lit and clutter-free.  Remove tripping hazards from walkways, including cords and throw rugs.  Install grab bars in bathrooms and safety rails on stairs.  Use rubber mats in the bathroom and other areas that are often wet or slippery.  Wear closed-toe shoes that fit well and support your feet. Wear shoes that have rubber soles or low heels.  Review your medicines with your health care provider. Some medicines can cause dizziness or changes in blood pressure, which can increase your risk of falling. General instructions  Include calcium and  vitamin D in your diet. Calcium is important for bone health, and vitamin D helps your body to absorb calcium. Good sources of calcium and vitamin D include: ? Certain fatty fish, such as salmon and tuna. ? Products that have calcium and vitamin D added to them (fortified products), such as fortified cereals. ? Egg yolks. ? Cheese. ? Liver.  Take over-the-counter and prescription medicines only as told by your health care provider.  Keep all follow-up visits as told by your health care provider. This is important. Contact a health care provider if:  You have never been screened for osteoporosis and you are: ? A woman who is age 59 or older. ? A man who is age 79 or older. Get help right away if:  You fall or injure yourself. Summary  Osteoporosis is thinning and loss of density in your bones. This makes bones more brittle and fragile and more likely to break (fracture),even with minor falls.  The goal of treatment is to strengthen your bones and reduce your risk for a fracture.  Include calcium and vitamin D in your diet. Calcium is important for bone health, and vitamin D helps your body to absorb calcium.  Talk with your health care provider about screening for osteoporosis if you are a woman who is age 30 or older, or a man who is age 58 or older. This information is not intended to replace advice given to you by your health care provider. Make sure you discuss any questions you have with your health care provider. Document Revised: 11/28/2017 Document Reviewed: 10/10/2017 Elsevier Patient Education  [place].  Alendronate tablets What is this medicine? ALENDRONATE (a LEN droe nate) slows calcium loss from bones. It helps to make normal healthy bone and to slow bone loss in people with Paget's disease and osteoporosis. It may be used in others at risk for bone loss. This medicine may be used for other purposes; ask your health care provider or pharmacist if you have  questions. COMMON BRAND NAME(S): Fosamax What should I tell my health care provider before I take this medicine? They need to know if you have any of these conditions:  dental disease  esophagus, stomach, or intestine problems, like acid reflux or GERD  kidney disease  low blood calcium  low vitamin D  problems sitting or standing 30 minutes  trouble swallowing  an unusual or allergic reaction to alendronate, other medicines, foods, dyes, or preservatives  pregnant or trying to get pregnant  breast-feeding How should I use this medicine? You must take this medicine exactly as directed or you will lower the amount of the medicine you absorb into your body or you may cause yourself harm. Take this medicine by mouth first thing in the morning, after you are up for the day. Do not eat or drink anything before you take your medicine. Swallow the tablet with a full glass (6 to 8 fluid ounces) of plain water. Do not take this medicine with any other drink. Do not chew or crush the tablet. After taking this medicine, do not eat breakfast,  drink, or take any medicines or vitamins for at least 30 minutes. Sit or stand up for at least 30 minutes after you take this medicine; do not lie down. Do not take your medicine more often than directed. Talk to your pediatrician regarding the use of this medicine in children. Special care may be needed. Overdosage: If you think you have taken too much of this medicine contact a poison control center or emergency room at once. NOTE: This medicine is only for you. Do not share this medicine with others. What if I miss a dose? If you miss a dose, do not take it later in the day. Continue your normal schedule starting the next morning. Do not take double or extra doses. What may interact with this medicine?  aluminum hydroxide  antacids  aspirin  calcium supplements  drugs for inflammation like ibuprofen, naproxen, and others  iron  supplements  magnesium supplements  vitamins with minerals This list may not describe all possible interactions. Give your health care provider a list of all the medicines, herbs, non-prescription drugs, or dietary supplements you use. Also tell them if you smoke, drink alcohol, or use illegal drugs. Some items may interact with your medicine. What should I watch for while using this medicine? Visit your doctor or health care professional for regular checks ups. It may be some time before you see benefit from this medicine. Do not stop taking your medicine except on your doctor's advice. Your doctor or health care professional may order blood tests and other tests to see how you are doing. You should make sure you get enough calcium and vitamin D while you are taking this medicine, unless your doctor tells you not to. Discuss the foods you eat and the vitamins you take with your health care professional. Some people who take this medicine have severe bone, joint, and/or muscle pain. This medicine may also increase your risk for a broken thigh bone. Tell your doctor right away if you have pain in your upper leg or groin. Tell your doctor if you have any pain that does not go away or that gets worse. This medicine can make you more sensitive to the sun. If you get a rash while taking this medicine, sunlight may cause the rash to get worse. Keep out of the sun. If you cannot avoid being in the sun, wear protective clothing and use sunscreen. Do not use sun lamps or tanning beds/booths. What side effects may I notice from receiving this medicine? Side effects that you should report to your doctor or health care professional as soon as possible:  allergic reactions like skin rash, itching or hives, swelling of the face, lips, or tongue  black or tarry stools  bone, muscle or joint pain  changes in vision  chest pain  heartburn or stomach pain  jaw pain, especially after dental work  pain or  trouble when swallowing  redness, blistering, peeling or loosening of the skin, including inside the mouth Side effects that usually do not require medical attention (report to your doctor or health care professional if they continue or are bothersome):  changes in taste  diarrhea or constipation  eye pain or itching  headache  nausea or vomiting  stomach gas or fullness This list may not describe all possible side effects. Call your doctor for medical advice about side effects. You may report side effects to FDA at 1-800-FDA-1088. Where should I keep my medicine? Keep out of the reach of children.  Store at room temperature of 15 and 30 degrees C (59 and 86 degrees F). Throw away any unused medicine after the expiration date. NOTE: This sheet is a summary. It may not cover all possible information. If you have questions about this medicine, talk to your doctor, pharmacist, or health care provider.  2020 Elsevier/Gold Standard (2011-06-14 08:56:09)

## 2020-01-31 ENCOUNTER — Telehealth: Payer: Self-pay | Admitting: Family Medicine

## 2020-01-31 DIAGNOSIS — E782 Mixed hyperlipidemia: Secondary | ICD-10-CM

## 2020-02-07 ENCOUNTER — Ambulatory Visit: Payer: Medicare HMO | Attending: Internal Medicine

## 2020-02-07 DIAGNOSIS — Z23 Encounter for immunization: Secondary | ICD-10-CM

## 2020-02-07 NOTE — Progress Notes (Signed)
   Covid-19 Vaccination Clinic  Name:  Paisley Sheffler    MRN: [id number] DOB: [birth year]  02/07/2020  Ms. Vallery was observed post Covid-19 immunization for 15 minutes without incidence. She was provided with Vaccine Information Sheet and instruction to access the V-Safe system.   Ms. Hockman was instructed to call 911 with any severe reactions post vaccine: [name] Difficulty breathing  . Swelling of your face and throat  . A fast heartbeat  . A bad rash all over your body  . Dizziness and weakness    Immunizations Administered    Name Date Dose VIS Date Route   Pfizer COVID-19 Vaccine 02/07/2020  3:12 PM 0.3 mL 12/10/2019 Intramuscular   Manufacturer: [place]   Lot: [id number]   [place]: [id number]

## 2020-02-10 ENCOUNTER — Other Ambulatory Visit: Payer: Self-pay

## 2020-02-10 DIAGNOSIS — E782 Mixed hyperlipidemia: Secondary | ICD-10-CM

## 2020-02-10 MED ORDER — ATORVASTATIN CALCIUM 10 MG PO TABS: 10.0000 mg | ORAL_TABLET | Freq: Every day | ORAL | 0 refills | Status: DC

## 2020-02-10 NOTE — Telephone Encounter (Signed)
Pt called asking why Atorvastatin was refused to be refilled. Pt asked if it could be refilled. Please advise.

## 2020-02-10 NOTE — Telephone Encounter (Signed)
Spoke with patient. Atorvastatin sent to [facility]. Pt reminded to schedule f/u appt around May with Dr. Jonni Sanger. She states that she does not take medication on a daily basis that is why it also lasted her so long.

## 2020-03-03 ENCOUNTER — Ambulatory Visit: Payer: Medicare HMO | Attending: Internal Medicine

## 2020-03-03 DIAGNOSIS — Z23 Encounter for immunization: Secondary | ICD-10-CM | POA: Insufficient documentation

## 2020-03-03 NOTE — Progress Notes (Signed)
   Covid-19 Vaccination Clinic  Name:  Morgan [name]    MRN: [id number] DOB: [birth year]  03/03/2020  [name] was observed post Covid-19 immunization for 15 minutes without incident. She was provided with Vaccine Information Sheet and instruction to access the V-Safe system.   [name] was instructed to call 911 with any severe reactions post vaccine: [name] Difficulty breathing  . Swelling of face and throat  . A fast heartbeat  . A bad rash all over body  . Dizziness and weakness   Immunizations Administered    Name Date Dose VIS Date Route   Pfizer COVID-19 Vaccine 03/03/2020  2:55 PM 0.3 mL 12/10/2019 Intramuscular   Manufacturer: [place]   Lot: [id number]   [place]: [id number]

## 2020-03-09 ENCOUNTER — Telehealth: Payer: Self-pay | Admitting: Family Medicine

## 2020-03-09 NOTE — Telephone Encounter (Signed)
Monica from [employer] called in wanting Dr.Andy to know that she was speaking with the patient and doing a medical adherence for the prescription atorvastatin (LIPITOR) 10 MG tablet-1 tab  once daily and it is not getting filled regularly. Patient is only taking it 4 days out of the week, if Dr.Andy was aware and okay with it then the insurance company is needing new prescription sent to the pharmacy

## 2020-03-10 ENCOUNTER — Other Ambulatory Visit: Payer: Self-pay

## 2020-03-10 NOTE — Telephone Encounter (Signed)
Left message for Brayton Layman to return call.

## 2020-03-10 NOTE — Telephone Encounter (Signed)
LVM for [facility] to return call.

## 2020-03-13 ENCOUNTER — Other Ambulatory Visit: Payer: Self-pay

## 2020-03-13 ENCOUNTER — Other Ambulatory Visit (INDEPENDENT_AMBULATORY_CARE_PROVIDER_SITE_OTHER): Payer: Medicare HMO

## 2020-03-13 DIAGNOSIS — E039 Hypothyroidism, unspecified: Secondary | ICD-10-CM

## 2020-03-14 LAB — TSH: TSH: 0.44 u[IU]/mL (ref 0.35–4.50)

## 2020-03-21 DIAGNOSIS — R69 Illness, unspecified: Secondary | ICD-10-CM | POA: Diagnosis not present

## 2020-03-22 ENCOUNTER — Other Ambulatory Visit: Payer: Self-pay

## 2020-03-22 DIAGNOSIS — E782 Mixed hyperlipidemia: Secondary | ICD-10-CM

## 2020-03-22 MED ORDER — ATORVASTATIN CALCIUM 10 MG PO TABS: ORAL_TABLET | ORAL | 2 refills | Status: DC

## 2020-03-23 ENCOUNTER — Other Ambulatory Visit: Payer: Self-pay | Admitting: Family Medicine

## 2020-03-23 DIAGNOSIS — E782 Mixed hyperlipidemia: Secondary | ICD-10-CM

## 2020-03-23 NOTE — Telephone Encounter (Signed)
See pharmacy comment about allergies to statins. Thanks.

## 2020-03-23 NOTE — Telephone Encounter (Signed)
She is newer pt of mine from [facility]. Please call patient to clarify if she has been on lipitor and how she is taking it.  If she is tolerating it, then refill as she is taking it for 90 w/ 3 refills and clarify for pharmacy. Thanks .

## 2020-03-24 NOTE — Telephone Encounter (Signed)
Just spoke with patient, she is currently taking this with no side effects/symptoms and does not need a refill sent right now.

## 2020-03-26 ENCOUNTER — Other Ambulatory Visit: Payer: Self-pay | Admitting: Family Medicine

## 2020-03-26 DIAGNOSIS — F329 Major depressive disorder, single episode, unspecified: Secondary | ICD-10-CM

## 2020-03-26 DIAGNOSIS — F32A Depression, unspecified: Secondary | ICD-10-CM

## 2020-03-26 DIAGNOSIS — F419 Anxiety disorder, unspecified: Secondary | ICD-10-CM

## 2020-05-04 ENCOUNTER — Other Ambulatory Visit: Payer: Self-pay | Admitting: Family Medicine

## 2020-05-04 DIAGNOSIS — E782 Mixed hyperlipidemia: Secondary | ICD-10-CM

## 2020-05-12 ENCOUNTER — Telehealth: Payer: Self-pay | Admitting: Family Medicine

## 2020-05-12 NOTE — Telephone Encounter (Signed)
Patient is coming in for a follow up on 05-19-2020 at 3:30 would like to see if Dr Jonni Sanger would order labs asap, so they could discuss in at her appointment. Please Advise patient.

## 2020-05-15 NOTE — Telephone Encounter (Signed)
She is scheduled for her annual physical exam and f/u: I will get all labs needed on the day of her visit.

## 2020-05-19 ENCOUNTER — Ambulatory Visit (INDEPENDENT_AMBULATORY_CARE_PROVIDER_SITE_OTHER): Payer: Medicare HMO | Admitting: Family Medicine

## 2020-05-19 ENCOUNTER — Encounter: Payer: Medicare HMO | Admitting: Family Medicine

## 2020-05-19 ENCOUNTER — Other Ambulatory Visit: Payer: Self-pay

## 2020-05-19 ENCOUNTER — Encounter: Payer: Self-pay | Admitting: Family Medicine

## 2020-05-19 VITALS — BP 128/70 | HR 67 | Temp 95.4°F | Resp 18 | Ht 63.0 in | Wt 154.2 lb

## 2020-05-19 DIAGNOSIS — E782 Mixed hyperlipidemia: Secondary | ICD-10-CM | POA: Diagnosis not present

## 2020-05-19 DIAGNOSIS — R7301 Impaired fasting glucose: Secondary | ICD-10-CM | POA: Diagnosis not present

## 2020-05-19 DIAGNOSIS — Z0000 Encounter for general adult medical examination without abnormal findings: Secondary | ICD-10-CM

## 2020-05-19 DIAGNOSIS — B001 Herpesviral vesicular dermatitis: Secondary | ICD-10-CM | POA: Diagnosis not present

## 2020-05-19 DIAGNOSIS — R69 Illness, unspecified: Secondary | ICD-10-CM | POA: Diagnosis not present

## 2020-05-19 DIAGNOSIS — M810 Age-related osteoporosis without current pathological fracture: Secondary | ICD-10-CM

## 2020-05-19 DIAGNOSIS — G4733 Obstructive sleep apnea (adult) (pediatric): Secondary | ICD-10-CM

## 2020-05-19 DIAGNOSIS — E039 Hypothyroidism, unspecified: Secondary | ICD-10-CM | POA: Diagnosis not present

## 2020-05-19 DIAGNOSIS — F411 Generalized anxiety disorder: Secondary | ICD-10-CM | POA: Diagnosis not present

## 2020-05-19 MED ORDER — DULOXETINE HCL 30 MG PO CPEP: 30.0000 mg | ORAL_CAPSULE | Freq: Every day | ORAL | 3 refills | Status: DC

## 2020-05-19 MED ORDER — VALACYCLOVIR HCL 1 G PO TABS: 2000.0000 mg | ORAL_TABLET | Freq: Two times a day (BID) | ORAL | 2 refills | Status: DC

## 2020-05-19 NOTE — Progress Notes (Signed)
Subjective  Chief Complaint  Patient presents with  . Annual Exam    Feeling fatigued, weak and tired for the last couple of months. Mental fog. Every 2 weeks she has been experiencing fever blister.     HPI: Morgan [name] is a 69 y.o. female who presents to [facility] [place] today for a Female Wellness Visit. She also has the concerns and/or needs as listed above in the chief complaint. These will be addressed in addition to the Health Maintenance Visit.   Wellness Visit: annual visit with health maintenance review and exam without Pap   HM: up to date on screens. Doing ok. Has a few concerns about energy and memory. imms up to date Chronic disease f/u and/or acute problem visit: (deemed necessary to be done in addition to the wellness visit):  Osteoporosis: was gonna start fosamax: had one reading of T= -2.5 but other scores were better. She has decided to hold off on treatment at this time. Will recheck in 2 years. She uses vit d and calcium and will start exercising again when she feels [facility]  HLD: non fasting for labs today. tolearating statin  Low thyroid: we have been adjusting dose back and forth. Reports less energy and "brain fog" but also some fine tremor recently. No weight loss, sweats, palpitations. Her last TSH's were normal but trending downward again.   GAD and depression: on cymbalta for many years. Having intermittent bad days: may be due to long year due to covid. Had added wellbutrin last year but that made her feel worse. No anhedonia. Tries to keep busy/active and engaged with friends  Memory: has noted more problems remembering names and recalling words.   IFG: no sxs of hyperglycemia  Cold sores more frequently over last several months for unclear reason. Uses prn acyclovir, one or two doses only.   Assessment  1. Annual physical exam   2. Mixed hyperlipidemia   3. Acquired hypothyroidism   4. GAD  (generalized anxiety disorder)   5. Osteoporosis, post-menopausal   6. OSA (obstructive sleep apnea), not using CPAP   7. IFG (impaired fasting glucose)   8. Recurrent cold sores      Plan  Female Wellness Visit:  Age appropriate Health Maintenance and Prevention measures were discussed with patient. Included topics are cancer screening recommendations, ways to keep healthy (see AVS) including dietary and exercise recommendations, regular eye and dental care, use of seat belts, and avoidance of moderate alcohol use and tobacco use. utd  BMI: discussed patient's BMI and encouraged positive lifestyle modifications to help get to or maintain a target BMI.  HM needs and immunizations were addressed and ordered. See below for orders. See HM and immunization section for updates. utd  Routine labs and screening tests ordered including cmp, cbc and lipids where appropriate.  Discussed recommendations regarding Vit D and calcium supplementation (see AVS)  Chronic disease management visit and/or acute problem visit:  Osteoporosis: to recheck dexa in 2 years. No meds for now. This is reasonable. Continue other preventive strategies  HLD and low thryoid: recheck today. Adjust meds as indicated.   Depression and anxiety: active and multifactorial. Will continue cymbalta and work on behavioral mgt; to return if worsens.   IFG; recheck a1c  Cold sores: change to valtrex and to complete full 2gm bid dose as needed to see if helps.   Follow up: 6 months for mood recheck.   Orders Placed This  Encounter  Procedures  . CBC with Differential/Platelet  . Comprehensive metabolic panel  . Lipid panel  . TSH  . Hemoglobin A1c   No orders of the defined types were placed in this encounter.     Lifestyle: Body mass index is 27.32 kg/m. Wt Readings from Last 3 Encounters:  05/19/20 154 lb 3.2 oz (69.9 kg)  01/19/20 152 lb 9.6 oz (69.2 kg)  01/11/20 152 lb 12.8 oz (69.3 kg)     Patient  Active Problem List   Diagnosis Date Noted  . OSA (obstructive sleep apnea), not using CPAP 07/04/2010    Priority: High  . Mixed hyperlipidemia 07/14/2007    Priority: High  . Acquired hypothyroidism 06/29/2007    Priority: High  . Osteoporosis, post-menopausal 11/12/2019    Priority: Medium    Dexa 11/2019: T = - 2.6 right femur, worse than prior dexa 2017; recommend starting treatment.    . Colon polyps 11/12/2019    Priority: Medium  . GAD (generalized anxiety disorder) 02/22/2014    Priority: Medium  . Diastolic dysfunction [id number]    Priority: Medium  . Osteoarthritis 06/29/2007    Priority: Medium  . 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 Mild mitral regurgitation by prior echocardiogram 07/24/2010    Priority: Low  . Allergic rhinitis 06/29/2007    Priority: Low  . IFG (impaired fasting glucose) 05/19/2020   Health Maintenance  Topic Date Due  . INFLUENZA VACCINE  07/30/2020  . MAMMOGRAM  01/09/2021  . DEXA SCAN  12/20/2021  . COLONOSCOPY  10/16/2022  . TETANUS/TDAP  01/18/2026  . COVID-19 Vaccine  Completed  . Hepatitis C Screening  Completed  . PNA vac Low Risk Adult  Completed   Immunization History  Administered Date(s) Administered  . Fluad Quad(high Dose 65+) 09/03/2019  . Influenza Split 11/29/2011, 10/23/2012  . Influenza Whole 09/30/2008, 11/10/2010  . Influenza, High Dose Seasonal PF 09/15/2017, 09/14/2018  . Influenza,inj,Quad PF,6+ Mos 10/01/2013, 09/13/2014, 10/03/2015, 10/30/2016  . PFIZER SARS-COV-2 Vaccination 02/07/2020, 03/03/2020  . Pneumococcal Conjugate-13 10/22/2013  . Pneumococcal Polysaccharide-23 03/16/2015  . Td 12/30/2005, 01/19/2016  . Zoster 08/02/2011  . Zoster Recombinat (Shingrix) 01/03/2020, 05/04/2020   We updated and reviewed the patient's past history in detail and it is documented below. Allergies: Patient is allergic to simvastatin and sulfonamide  derivatives. Past Medical History Patient  has a past medical history of Allergy, Anxiety and depression (02/22/2014), Arthritis, Depression, Diastolic dysfunction, Elevated cholesterol, Grover's disease (11/12/2019), History of colonic polyps, Hypothyroidism, Mitral regurgitation, OSA (obstructive sleep apnea), Osteoarthritis (06/29/2007), Osteopenia, Osteoporosis, post-menopausal (11/12/2019), Recurrent cold sores (11/12/2019), Seasonal allergies, Sleep apnea, and Tinnitus. 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 Family History: Patient family history includes Arthritis in an other family member; Brain cancer in her father; Breast cancer in her maternal aunt and paternal aunt; Diabetes in her brother, father, and paternal grandfather; Heart attack in her father; Heart disease in her father; Hyperlipidemia in her father; Macular degeneration in her mother; Migraines in her daughter; Pulmonary embolism in her daughter. Social History:  Patient  reports that she has never smoked. She has never used smokeless tobacco. She reports current alcohol use. She reports that she does not use drugs.  Review of Systems: Constitutional: negative for fever or malaise Ophthalmic: negative for photophobia, double  vision or loss of vision Cardiovascular: negative for chest pain, dyspnea on exertion, or new LE swelling Respiratory: negative for SOB or persistent cough Gastrointestinal: negative for abdominal pain, change in bowel habits or melena Genitourinary: negative for dysuria or gross hematuria, no abnormal uterine bleeding or disharge Musculoskeletal: negative for new gait disturbance or muscular weakness Integumentary: negative for new or persistent rashes, no breast lumps Neurological: negative for TIA or stroke symptoms Psychiatric:  negative for SI or delusions Allergic/Immunologic: negative for hives  Patient Care Team    Relationship Specialty Notifications Start End  Leamon Arnt, MD PCP - General Family Medicine  11/12/19   Ladene Artist, MD Consulting Physician Gastroenterology  03/15/15   Harriett Sine, MD Consulting Physician Dermatology  01/11/20   Dyke Maes, OD Consulting Physician Optometry  01/11/20     Objective  Vitals: BP 128/70   Pulse 67   Temp (!) 95.4 F (35.2 C) (Temporal)   Resp 18   Ht 5\' 3"  (1.6 m)   Wt 154 lb 3.2 oz (69.9 kg)   SpO2 98%   BMI 27.32 kg/m  General:  Well developed, well nourished, no acute distress  Psych:  Alert and orientedx3,normal mood and affect HEENT:  Normocephalic, atraumatic, non-icteric sclera,  supple neck without adenopathy, mass or thyromegaly Cardiovascular:  Normal S1, S2, RRR without gallop, rub or murmur, tr edema. Respiratory:  Good breath sounds bilaterally, CTAB with normal respiratory effort Gastrointestinal: normal bowel sounds, soft, non-tender, no noted masses. No HSM MSK: no deformities, contusions. Joints are without erythema or swelling.  Skin:  Warm, no rashes or suspicious lesions noted Neurologic:    Mental status is normal. CN 2-11 are normal. Gross motor and sensory exams are normal. Fine tremor nl gait.  Breast Exam: No mass, skin retraction or nipple discharge is appreciated in either breast. No axillary adenopathy. Fibrocystic changes are not noted    Commons side effects, risks, benefits, and alternatives for medications and treatment plan prescribed today were discussed, and the patient expressed understanding of the given instructions. Patient is instructed to call or message via MyChart if he/she has any questions or concerns regarding our treatment plan. No barriers to understanding were identified. We discussed Red Flag symptoms and signs in detail. Patient expressed understanding regarding what to do in case of urgent  or emergency type symptoms.   Medication list was reconciled, printed and provided to the patient in AVS. Patient instructions and summary information was reviewed with the patient as documented in the AVS. This note was prepared with assistance of Dragon voice recognition software. Occasional wrong-word or sound-a-like substitutions may have occurred due to the inherent limitations of voice recognition software  This visit occurred during the SARS-CoV-2 public health emergency.  Safety protocols were in place, including screening questions prior to the visit, additional usage of staff PPE, and extensive cleaning of exam room while observing appropriate contact time as indicated for disinfecting solutions.

## 2020-05-19 NOTE — Patient Instructions (Addendum)
Please return in 6 months for mood recheck. Sooner if mood is worsening.   I will release your lab results to you on your MyChart account with further instructions. Please reply with any questions.   I've ordered a new medication for your cold sores. Let's see if this works better.  I'll adjust your thyroid medications if needed.   If you have any questions or concerns, please don't hesitate to send me a message via MyChart or call the office at [phone]. Thank you for visiting with [place] today! It's our pleasure caring for you.

## 2020-05-20 LAB — CBC WITH DIFFERENTIAL/PLATELET
Absolute Monocytes: 518 cells/uL (ref 200–950)
Basophils Absolute: 28 cells/uL (ref 0–200)
Basophils Relative: 0.4 %
Eosinophils Absolute: 343 cells/uL (ref 15–500)
Eosinophils Relative: 4.9 %
HCT: 37.1 % (ref 35.0–45.0)
Hemoglobin: 12.3 g/dL (ref 11.7–15.5)
Lymphs Abs: 2499 cells/uL (ref 850–3900)
MCH: 30.9 pg (ref 27.0–33.0)
MCHC: 33.2 g/dL (ref 32.0–36.0)
MCV: 93.2 fL (ref 80.0–100.0)
MPV: 9.6 fL (ref 7.5–12.5)
Monocytes Relative: 7.4 %
Neutro Abs: 3612 cells/uL (ref 1500–7800)
Neutrophils Relative %: 51.6 %
Platelets: 274 10*3/uL (ref 140–400)
RBC: 3.98 10*6/uL (ref 3.80–5.10)
RDW: 12.7 % (ref 11.0–15.0)
Total Lymphocyte: 35.7 %
WBC: 7 10*3/uL (ref 3.8–10.8)

## 2020-05-20 LAB — COMPREHENSIVE METABOLIC PANEL
AG Ratio: 1.9 (calc) (ref 1.0–2.5)
ALT: 14 U/L (ref 6–29)
AST: 18 U/L (ref 10–35)
Albumin: 4.2 g/dL (ref 3.6–5.1)
Alkaline phosphatase (APISO): 78 U/L (ref 37–153)
BUN: 20 mg/dL (ref 7–25)
CO2: 26 mmol/L (ref 20–32)
Calcium: 9.2 mg/dL (ref 8.6–10.4)
Chloride: 105 mmol/L (ref 98–110)
Creat: 0.74 mg/dL (ref 0.60–0.93)
Globulin: 2.2 g/dL (calc) (ref 1.9–3.7)
Glucose, Bld: 100 mg/dL — ABNORMAL HIGH (ref 65–99)
Potassium: 4.2 mmol/L (ref 3.5–5.3)
Sodium: 141 mmol/L (ref 135–146)
Total Bilirubin: 0.4 mg/dL (ref 0.2–1.2)
Total Protein: 6.4 g/dL (ref 6.1–8.1)

## 2020-05-20 LAB — HEMOGLOBIN A1C
Hgb A1c MFr Bld: 5.5 % of total Hgb (ref <5.7)
Mean Plasma Glucose: 111 (calc)
eAG (mmol/L): 6.2 (calc)

## 2020-05-20 LAB — LIPID PANEL
Cholesterol: 191 mg/dL (ref <200)
HDL: 66 mg/dL (ref >=50)
LDL Cholesterol (Calc): 110 mg/dL (calc) — ABNORMAL HIGH
Non-HDL Cholesterol (Calc): 125 mg/dL (calc) (ref <130)
Total CHOL/HDL Ratio: 2.9 (calc) (ref <5.0)
Triglycerides: 68 mg/dL (ref <150)

## 2020-05-20 LAB — TSH: TSH: 0.38 mIU/L — ABNORMAL LOW (ref 0.40–4.50)

## 2020-05-23 NOTE — Progress Notes (Signed)
Please call patient: I have reviewed his/her lab results. Thyroid test is now just below normal indicating a little too much hormone: Options: continue same dose and recheck in 3 months: this level would be fine but if it is worsening, would  need to change dose. OR I can adjust dose now to a variable dose. Her preference. Let me know. Thanks!

## 2020-05-26 ENCOUNTER — Other Ambulatory Visit: Payer: Self-pay

## 2020-05-26 DIAGNOSIS — E782 Mixed hyperlipidemia: Secondary | ICD-10-CM

## 2020-05-26 MED ORDER — LEVOTHYROXINE SODIUM 75 MCG PO TABS: ORAL_TABLET | ORAL | 3 refills | Status: DC

## 2020-05-26 MED ORDER — LEVOTHYROXINE SODIUM 50 MCG PO TABS: 50.0000 ug | ORAL_TABLET | Freq: Every day | ORAL | 3 refills | Status: DC

## 2020-05-26 MED ORDER — LEVOTHYROXINE SODIUM 50 MCG PO TABS: ORAL_TABLET | ORAL | 3 refills | Status: DC

## 2020-05-26 MED ORDER — VALACYCLOVIR HCL 1 G PO TABS: 2000.0000 mg | ORAL_TABLET | Freq: Two times a day (BID) | ORAL | 2 refills | Status: DC

## 2020-05-26 NOTE — Telephone Encounter (Signed)
Please call patient.  I have ordered the 64mcg pill. Please take the lower dose, 75mcg pill on Tuesdays and Thursdays. Continue the 89mcg pill on the other days.  We will need a lab visit in 8 weeks to see if this is the correct dose for her.  Please schedule and order a future TSH.  Tyna Jaksch

## 2020-05-26 NOTE — Telephone Encounter (Signed)
Spoke with the patient and she verbalized understanding the instruction given about her Levothyroxine dose. She is aware of her lab appt date and time to recheck her TSH. No other questions or concerns at this time.

## 2020-05-26 NOTE — Telephone Encounter (Signed)
-----   Message from Jodell Cipro, [place] sent at 05/24/2020  4:17 PM EDT ----- Patient is aware of lab results per PCP. Agreeable to starting new "hybrid" dose now as discussed with PCP at last appt. Please send to [facility]

## 2020-07-18 DIAGNOSIS — H5203 Hypermetropia, bilateral: Secondary | ICD-10-CM | POA: Diagnosis not present

## 2020-07-19 ENCOUNTER — Other Ambulatory Visit: Payer: Self-pay | Admitting: Family Medicine

## 2020-07-19 DIAGNOSIS — F5102 Adjustment insomnia: Secondary | ICD-10-CM

## 2020-07-19 NOTE — Telephone Encounter (Signed)
Please review

## 2020-07-21 ENCOUNTER — Other Ambulatory Visit: Payer: Medicare HMO

## 2020-07-21 ENCOUNTER — Other Ambulatory Visit: Payer: Self-pay

## 2020-07-21 DIAGNOSIS — E782 Mixed hyperlipidemia: Secondary | ICD-10-CM | POA: Diagnosis not present

## 2020-07-21 LAB — TSH: TSH: 0.58 mIU/L (ref 0.40–4.50)

## 2020-07-21 NOTE — Addendum Note (Signed)
Addended by: Liliane Channel on: 07/21/2020 01:53 PM   Modules accepted: Orders

## 2020-08-02 ENCOUNTER — Telehealth: Payer: Self-pay | Admitting: Family Medicine

## 2020-08-02 NOTE — Telephone Encounter (Signed)
.   LAST APPOINTMENT DATE: 07/19/2020   NEXT APPOINTMENT DATE:@11 /23/2021  MEDICATION:diazepam (VALIUM) 5 MG tablet  [facility]AC[facility]: [id number] [place], [place] - [address]  **Let patient know to contact pharmacy at the end of the day to make sure medication is ready. **  ** Please notify patient to allow 48-72 hours to process**  **Encourage patient to contact the pharmacy for refills or they can request refills through [facility]**  CLINICAL FILLS OUT ALL BELOW:   LAST REFILL:  QTY:  REFILL DATE:    OTHER COMMENTS:    Okay for refill?  Please advise

## 2020-08-02 NOTE — Telephone Encounter (Signed)
LR: 05-10-2019 Qty: 30 w 1 refills (Dr. Juleen China) Last office visit: 05-19-2020 Upcoming appointment: 11-21-2020

## 2020-08-08 DIAGNOSIS — Z20822 Contact with and (suspected) exposure to covid-19: Secondary | ICD-10-CM | POA: Diagnosis not present

## 2020-09-06 ENCOUNTER — Other Ambulatory Visit: Payer: Self-pay

## 2020-09-06 ENCOUNTER — Ambulatory Visit (INDEPENDENT_AMBULATORY_CARE_PROVIDER_SITE_OTHER): Payer: Medicare HMO | Admitting: Family Medicine

## 2020-09-06 ENCOUNTER — Encounter: Payer: Self-pay | Admitting: Family Medicine

## 2020-09-06 VITALS — BP 130/70 | HR 68 | Temp 97.2°F | Resp 18 | Ht 63.0 in | Wt 154.6 lb

## 2020-09-06 DIAGNOSIS — E039 Hypothyroidism, unspecified: Secondary | ICD-10-CM | POA: Diagnosis not present

## 2020-09-06 DIAGNOSIS — R69 Illness, unspecified: Secondary | ICD-10-CM | POA: Diagnosis not present

## 2020-09-06 DIAGNOSIS — F331 Major depressive disorder, recurrent, moderate: Secondary | ICD-10-CM

## 2020-09-06 MED ORDER — DULOXETINE HCL 30 MG PO CPEP: 60.0000 mg | ORAL_CAPSULE | Freq: Every day | ORAL | 3 refills | Status: DC

## 2020-09-06 NOTE — Patient Instructions (Signed)
Please return in 5-6 weeks to recheck your mood.  Please start taking cymbalta 2 capsules together at night.   If you have any questions or concerns, please don't hesitate to send me a message via MyChart or call the office at [phone]. Thank you for visiting with [place] today! It's our pleasure caring for you.  When you're feeling too down to do anything, try these 10 Little things!  Take a shower. Even if you plan to stay in all day long and not see a soul, take a shower. It takes the most effort to hop in to the shower but once you do, you'll feel immediate results. It will wake you up and you'll be feeling much fresher (and cleaner too).  Brush and floss your teeth. Give your teeth a good brushing with a floss finish. It's a small task but it feels so good and you can check 'taking care of your health' off the list of things to do.  Do something small on your list. Most of [place] have some small thing we would like to get done (load of laundry, sew a button, email a friend). Doing one of these things will make you feel like you've accomplished something.  Drink water. Drinking water is easy right? It's also really beneficial for your health so keep a glass beside you all day and take sips often. It gives you energy and prevents you from boredom eating.  Do some floor exercises. The last thing you want to do is exercise but it might be just the thing you need the most. Keep it simple and do exercises that involve sitting or laying on the floor. Even the smallest of exercises release chemicals in the brain that make you feel good. Yoga stretches or core exercises are going to make you feel good with minimal effort.  Make your bed. Making your bed takes a few minutes but it's productive and you'll feel relieved when it's done. An unmade bed is a huge visual reminder that you're having an unproductive day. Do it and consider it your housework for the day.  Put on some nice clothes. Take the  sweatpants off even if you don't plan to go anywhere. Put on clothes that make you feel good. Take a look in the mirror so your brain recognizes the sweatpants have been replaced with clothes that make you look great. It's an instant confidence booster.  Wash the dishes. A pile of dirty dishes in the sink is a reflection of your mood. It's possible that if you wash up the dishes, your mood will follow suit. It's worth a try.  Cook a real meal. If you have the luxury to have a "do nothing" day, you have time to make a real meal for yourself. Make a meal that you love to eat. The process is good to get you out of the funk and the food will ensure you have more energy for tomorrow.  Write out your thoughts by hand. When you hand write, you stimulate your brain to focus on the moment that you're in so make yourself comfortable and write whatever comes into your mind. Put those thoughts out on paper so they stop spinning around in your head. Those thoughts might be the very thing holding you down.   Major Depressive Disorder, Adult Major depressive disorder (MDD) is a mental health condition. It may also be called clinical depression or unipolar depression. MDD usually causes feelings of sadness, hopelessness, or helplessness. MDD can  also cause physical symptoms. It can interfere with work, school, relationships, and other everyday activities. MDD may be mild, moderate, or severe. It may occur once (single episode major depressive disorder) or it may occur multiple times (recurrent major depressive disorder). What are the causes? The exact cause of this condition is not known. MDD is most likely caused by a combination of things, which may include:  Genetic factors. These are traits that are passed along from parent to child.  Individual factors. Your personality, your behavior, and the way you handle your thoughts and feelings may contribute to MDD. This includes personality traits and behaviors  learned from others.  Physical factors, such as: ? Differences in the part of your brain that controls emotion. This part of your brain may be different than it is in people who do not have MDD. ? Long-term (chronic) medical or psychiatric illnesses.  Social factors. Traumatic experiences or major life changes may play a role in the development of MDD. What increases the risk? This condition is more likely to develop in women. The following factors may also make you more likely to develop MDD:  A family history of depression.  Troubled family relationships.  Abnormally low levels of certain brain chemicals.  Traumatic events in childhood, especially abuse or the loss of a parent.  Being under a lot of stress, or long-term stress, especially from upsetting life experiences or losses.  A history of: ? Chronic physical illness. ? Other mental health disorders. ? Substance abuse.  Poor living conditions.  Experiencing social exclusion or discrimination on a regular basis. What are the signs or symptoms? The main symptoms of MDD typically include:  Constant depressed or irritable mood.  Loss of interest in things and activities. MDD symptoms may also include:  Sleeping or eating too much or too little.  Unexplained weight change.  Fatigue or low energy.  Feelings of worthlessness or guilt.  Difficulty thinking clearly or making decisions.  Thoughts of suicide or of harming others.  Physical agitation or weakness.  Isolation. Severe cases of MDD may also occur with other symptoms, such as:  Delusions or hallucinations, in which you imagine things that are not real (psychotic depression).  Low-level depression that lasts at least a year (chronic depression or persistent depressive disorder).  Extreme sadness and hopelessness (melancholic depression).  Trouble speaking and moving (catatonic depression). How is this diagnosed? This condition may be diagnosed based  on:  Your symptoms.  Your medical history, including your mental health history. This may involve tests to evaluate your mental health. You may be asked questions about your lifestyle, including any drug and alcohol use, and how long you have had symptoms of MDD.  A physical exam.  Blood tests to rule out other conditions. You must have a depressed mood and at least four other MDD symptoms most of the day, nearly every day in the same 2-week timeframe before your health care provider can confirm a diagnosis of MDD. How is this treated? This condition is usually treated by mental health professionals, such as psychologists, psychiatrists, and clinical social workers. You may need more than one type of treatment. Treatment may include:  Psychotherapy. This is also called talk therapy or counseling. Types of psychotherapy include: ? Cognitive behavioral therapy (CBT). This type of therapy teaches you to recognize unhealthy feelings, thoughts, and behaviors, and replace them with positive thoughts and actions. ? Interpersonal therapy (IPT). This helps you to improve the way you relate to and communicate  with others. ? Family therapy. This treatment includes members of your family.  Medicine to treat anxiety and depression, or to help you control certain emotions and behaviors.  Lifestyle changes, such as: ? Limiting alcohol and drug use. ? Exercising regularly. ? Getting plenty of sleep. ? Making healthy eating choices. ? Spending more time outdoors.  Treatments involving stimulation of the brain can be used in situations with extremely severe symptoms, or when medicine or other therapies do not work over time. These treatments include electroconvulsive therapy, transcranial magnetic stimulation, and vagal nerve stimulation. Follow these instructions at home: Activity  Return to your normal activities as told by your health care provider.  Exercise regularly and spend time outdoors as  told by your health care provider. General instructions  Take over-the-counter and prescription medicines only as told by your health care provider.  Do not drink alcohol. If you drink alcohol, limit your alcohol intake to no more than 1 drink a day for nonpregnant women and 2 drinks a day for men. One drink equals 12 oz of beer, 5 oz of wine, or 1 oz of hard liquor. Alcohol can affect any antidepressant medicines you are taking. Talk to your health care provider about your alcohol use.  Eat a healthy diet and get plenty of sleep.  Find activities that you enjoy doing, and make time to do them.  Consider joining a support group. Your health care provider may be able to recommend a support group.  Keep all follow-up visits as told by your health care provider. This is important. Where to find more information [employer] on Mental Illness  www.nami.org [place]. National Institute of Mental Health  https://carter.com/ National Suicide Prevention Lifeline  1-800-273-TALK 256-131-9334). This is free, 24-hour help. Contact a health care provider if:  Your symptoms get worse.  You develop new symptoms. Get help right away if:  You self-harm.  You have serious thoughts about hurting yourself or others.  You see, hear, taste, smell, or feel things that are not present (hallucinate). This information is not intended to replace advice given to you by your health care provider. Make sure you discuss any questions you have with your health care provider. Document Revised: 11/28/2017 Document Reviewed: 06/26/2016 Elsevier Patient Education  2020 [employer].

## 2020-09-06 NOTE — Progress Notes (Signed)
Subjective  CC:  Chief Complaint  Patient presents with  . Depression    Mood swings and feeling unfocused.     HPI: Morgan [name] is a 69 y.o. female who presents to the office today to address the problems listed above in the chief complaint, mood problems.  69 year old female with history of depression anxiety disorder on Cymbalta with worsening depressive symptoms. Back in May we started the discussion. She has had mild depressive symptoms. She was to work on Building [employer] strategies. However, things have worsened. She has low motivation, apathy, hypokinetic, sleeping more than usual, low appetite. She is not hopeless nor suicidal but life is becoming harder. She is not enjoying the things she typically does. She is pleasantly with history. She feels her anxiety is mild . she feels like she cannot focus on things and has trouble reading. She would like help. She is interested in medication changes. No major triggers no except Covid restrictions. Of note her father died with a brain tumor and she does have underlying concern about that. She denies headaches, double vision changes, nausea or vomiting. Depression screen [facility] 2/9 09/06/2020 05/19/2020 01/11/2020  Decreased Interest 3 0 0  Down, Depressed, Hopeless 3 0 0  PHQ - 2 Score 6 0 0  Altered sleeping 3 1 -  Tired, decreased energy 3 1 -  Change in appetite 1 0 -  Feeling bad or failure about yourself  1 0 -  Trouble concentrating 3 1 -  Moving slowly or fidgety/restless 0 0 -  Suicidal thoughts 0 0 -  PHQ-9 Score 17 3 -  Difficult doing work/chores Very difficult Somewhat difficult -   GAD 7 : Generalized Anxiety Score 09/06/2020 09/14/2018  Nervous, Anxious, on Edge 1 1  Control/stop worrying 0 0  Worry too much - different things 1 1  Trouble relaxing 0 0  Restless 0 0  Easily annoyed or irritable 1 1  Afraid - awful might happen 0 0  Total GAD 7 Score 3 3  Anxiety Difficulty Not difficult at all Somewhat  difficult    Previously on prescription medications for mood/anxiety: Has been on Cymbalta for the last 4 years or so. Had good response initially. Did not tolerate Wellbutrin Therapist/counseling: None Previous Diagnosis of psychiatric disorder: History of depression and anxiety Family history of psychiatric disorder: N/A  Assessment  1. Major depressive disorder, recurrent episode, moderate degree ([place])   2. Acquired hypothyroidism      Plan   Depression: Moderate exacerbation: Adjust Cymbalta dose up to 60 mg daily recheck in 5 to 6 weeks. Education given. Close follow-up. Consider adding Abilify if not improving  Reviewed concept of mood problems caused by biochemical imbalance of neurotransmitters and rationale for treatment with medications and therapy.   Counseling given: pt was instructed to contact office, on-call physician or crisis Hotline if symptoms worsen significantly. If patient develops any suicidal or homicidal thoughts, she is directed to the ER immediately.   Follow up: Return in about 6 weeks (around 10/18/2020) for mood follow up.  No orders of the defined types were placed in this encounter.  Meds ordered this encounter  Medications  . DULoxetine (CYMBALTA) 30 MG capsule    Sig: Take 2 capsules (60 mg total) by mouth daily.    Dispense:  90 capsule    Refill:  3      I reviewed the patients updated PMH, FH, and SocHx.    Patient Active Problem List  Diagnosis Date Noted  . OSA (obstructive sleep apnea), not using CPAP 07/04/2010    Priority: High  . Mixed hyperlipidemia 07/14/2007    Priority: High  . Acquired hypothyroidism 06/29/2007    Priority: High  . IFG (impaired fasting glucose) 05/19/2020    Priority: Medium  . 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 Colon polyps 11/12/2019    Priority: Medium  . GAD (generalized anxiety disorder) 02/22/2014    Priority: Medium  . Diastolic dysfunction 84/13/2440    Priority:  Medium  . Osteoarthritis 06/29/2007    Priority: Medium  . 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 Mild mitral regurgitation by prior echocardiogram 07/24/2010    Priority: Low  . Allergic rhinitis 06/29/2007    Priority: Low   Current Meds  Medication Sig  . atorvastatin (LIPITOR) 10 MG tablet TAKE 1 TABLET BY MOUTH EVERY DAY  . azelastine (ASTELIN) 0.1 % nasal spray Place 2 sprays into both nostrils 2 (two) times daily.  . Calcium-Magnesium-Vitamin D (CALCIUM 1200+D3 PO) Take by mouth.  . diazepam (VALIUM) 5 MG tablet TAKE 1/2 TO 1 TABLET BY MOUTH EVERY 8 HOURS AS NEEDED FOR ANXIETY  . DULoxetine (CYMBALTA) 30 MG capsule Take 2 capsules (60 mg total) by mouth daily.  [name] levothyroxine (SYNTHROID) 50 MCG tablet Take 1 tablet (50 mcg total) by mouth daily before breakfast. Every Tuesday and Thursday  . levothyroxine (SYNTHROID) 75 MCG tablet Take 34mcg every M,W,F,Sa and Su  . valACYclovir (VALTREX) 1000 MG tablet Take 2 tablets (2,000 mg total) by mouth 2 (two) times daily. Once, as needed for cold sores  . [DISCONTINUED] DULoxetine (CYMBALTA) 30 MG capsule Take 1 capsule (30 mg total) by mouth daily.    Allergies: Patient is allergic to simvastatin and sulfonamide derivatives. Family history:  Patient family history includes Arthritis in an other family member; Brain cancer in her father; Breast cancer in her maternal aunt and paternal aunt; Diabetes in her brother, father, and paternal grandfather; Heart attack in her father; Heart disease in her father; Hyperlipidemia in her father; Macular degeneration in her mother; Migraines in her daughter; Pulmonary embolism in her daughter. Social History   Socioeconomic History  . Marital status: Widowed    Spouse name: Not on file  . Number of children: Not on file  . Years of education: Not on file  . Highest education level: Not on file  Occupational History  . Not on file    Tobacco Use  . Smoking status: Never Smoker  . Smokeless tobacco: Never Used  Substance and Sexual Activity  . Alcohol use: Yes    Comment: rare  . Drug use: No  . 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 No major dietary restrictions  Other Topics Concern  . Not on file  Social History Narrative  . Not on file   Social Determinants of Health   Financial Resource Strain:   . Difficulty of Paying Living Expenses: Not on file  Food Insecurity:   . Worried About Charity fundraiser in the Last Year: Not on file  . Ran Out of Food in the Last Year: Not on file  Transportation Needs:   . Lack of Transportation (Medical): Not on file  . Lack of Transportation (Non-Medical): Not on file  Physical Activity:   . Days of Exercise per Week:  Not on file  . Minutes of Exercise per Session: Not on file  Stress:   . Feeling of Stress : Not on file  Social Connections:   . Frequency of Communication with Friends and Family: Not on file  . Frequency of Social Gatherings with Friends and Family: Not on file  . Attends Religious Services: Not on file  . Active Member of Clubs or Organizations: Not on file  . Attends Archivist Meetings: Not on file  . Marital Status: Not on file     Review of Systems: Constitutional: Negative for fever malaise or anorexia Cardiovascular: negative for chest pain Respiratory: negative for SOB or persistent cough Gastrointestinal: negative for abdominal pain  Objective  Vitals: BP 130/70   Pulse 68   Temp (!) 97.2 F (36.2 C) (Temporal)   Resp 18   Ht 5\' 3"  (1.6 m)   Wt 154 lb 9.6 oz (70.1 kg)   SpO2 99%   BMI 27.39 kg/m  General: no acute distress, well appearing, no apparent distress, well groomed Psych:  Alert and oriented x 3,normal mood, behavior, speech, dress, and thought processes.      Commons side effects, risks, benefits, and alternatives for medications and treatment plan  prescribed today were discussed, and the patient expressed understanding of the given instructions. Patient is instructed to call or message via MyChart if he/she has any questions or concerns regarding our treatment plan. No barriers to understanding were identified. We discussed Red Flag symptoms and signs in detail. Patient expressed understanding regarding what to do in case of urgent or emergency type symptoms.   Medication list was reconciled, printed and provided to the patient in AVS. Patient instructions and summary information was reviewed with the patient as documented in the AVS. This note was prepared with assistance of Dragon voice recognition software. Occasional wrong-word or sound-a-like substitutions may have occurred due to the inherent limitations of voice recognition software

## 2020-09-17 DIAGNOSIS — R69 Illness, unspecified: Secondary | ICD-10-CM | POA: Diagnosis not present

## 2020-09-21 DIAGNOSIS — L814 Other melanin hyperpigmentation: Secondary | ICD-10-CM | POA: Diagnosis not present

## 2020-09-21 DIAGNOSIS — D485 Neoplasm of uncertain behavior of skin: Secondary | ICD-10-CM | POA: Diagnosis not present

## 2020-09-21 DIAGNOSIS — L821 Other seborrheic keratosis: Secondary | ICD-10-CM | POA: Diagnosis not present

## 2020-09-21 DIAGNOSIS — L820 Inflamed seborrheic keratosis: Secondary | ICD-10-CM | POA: Diagnosis not present

## 2020-09-21 DIAGNOSIS — L718 Other rosacea: Secondary | ICD-10-CM | POA: Diagnosis not present

## 2020-09-21 DIAGNOSIS — L570 Actinic keratosis: Secondary | ICD-10-CM | POA: Diagnosis not present

## 2020-09-21 DIAGNOSIS — L111 Transient acantholytic dermatosis [Grover]: Secondary | ICD-10-CM | POA: Diagnosis not present

## 2020-09-25 ENCOUNTER — Ambulatory Visit (INDEPENDENT_AMBULATORY_CARE_PROVIDER_SITE_OTHER): Payer: Medicare HMO | Admitting: Family Medicine

## 2020-09-25 ENCOUNTER — Encounter: Payer: Self-pay | Admitting: Family Medicine

## 2020-09-25 ENCOUNTER — Other Ambulatory Visit: Payer: Self-pay

## 2020-09-25 VITALS — BP 112/70 | HR 69 | Temp 96.9°F | Resp 16 | Ht 63.0 in | Wt 152.8 lb

## 2020-09-25 DIAGNOSIS — R69 Illness, unspecified: Secondary | ICD-10-CM | POA: Diagnosis not present

## 2020-09-25 DIAGNOSIS — G4733 Obstructive sleep apnea (adult) (pediatric): Secondary | ICD-10-CM

## 2020-09-25 DIAGNOSIS — F339 Major depressive disorder, recurrent, unspecified: Secondary | ICD-10-CM | POA: Diagnosis not present

## 2020-09-25 DIAGNOSIS — R5383 Other fatigue: Secondary | ICD-10-CM

## 2020-09-25 DIAGNOSIS — W57XXXD Bitten or stung by nonvenomous insect and other nonvenomous arthropods, subsequent encounter: Secondary | ICD-10-CM

## 2020-09-25 MED ORDER — VORTIOXETINE HBR 10 MG PO TABS: 10.0000 mg | ORAL_TABLET | Freq: Every day | ORAL | 11 refills | Status: DC

## 2020-09-25 NOTE — Progress Notes (Signed)
Subjective  CC:  Chief Complaint  Patient presents with  . Depression    unable to function on Cymbalta 60 mg    HPI: Morgan [name] is a 69 y.o. female who presents to the office today to address the problems listed above in the chief complaint, mood problems.  F/u mood: About 3 weeks ago we increased Cymbalta from 30 mg to 60 mg daily.  She had adverse effects including mental fog, cognitive decline, and feeling very tired.  She recently decreased back to 30 mg but still feels foggy.  She reports that she is having extreme fatigue.  Feels tired after small activities like showering.  She denies palpitations, sweats, chest pain, heaviness in chest, shortness of breath.  She can sleep all day.  She admits to anhedonia and low motivation.  But she is not certain that her fatigue is due to depression.  She had a tick bite earlier in the summer and wonders if continuing today.  She denies fevers chills or rash.  She has no arthralgias.  She does have sleep apnea and is not using her CPAP machine.  Reviewed labs from May which showed normal hemoglobin, CMP and TSH was normal in July.   Depression screen [facility] 2/9 09/06/2020 05/19/2020 01/11/2020  Decreased Interest 3 0 0  Down, Depressed, Hopeless 3 0 0  PHQ - 2 Score 6 0 0  Altered sleeping 3 1 -  Tired, decreased energy 3 1 -  Change in appetite 1 0 -  Feeling bad or failure about yourself  1 0 -  Trouble concentrating 3 1 -  Moving slowly or fidgety/restless 0 0 -  Suicidal thoughts 0 0 -  PHQ-9 Score 17 3 -  Difficult doing work/chores Very difficult Somewhat difficult -   GAD 7 : Generalized Anxiety Score 09/06/2020 09/14/2018  Nervous, Anxious, on Edge 1 1  Control/stop worrying 0 0  Worry too much - different things 1 1  Trouble relaxing 0 0  Restless 0 0  Easily annoyed or irritable 1 1  Afraid - awful might happen 0 0  Total GAD 7 Score 3 3  Anxiety Difficulty Not difficult at all Somewhat difficult     Assessment  1.  Fatigue, unspecified type   2. Major depression, recurrent, chronic ([place])   3. Tick bite, subsequent encounter   4. OSA (obstructive sleep apnea), not using CPAP      Plan   Fatigue: Patient is worried that there is a medical etiology to her progressive fatigue symptoms.  We will start work-up with repeat lab work, Lyme's antibodies, sed rate, mono antibodies and continue to monitor.  I reviewed multiple records.  She has had a brain MRI, chest CT in the not too distant past.  Both showed mild changes but no worrisome significant findings.  She does have small vessel vascular disease in her brain MRI.  She complains of brain fog: We discussed that this could be due to Cymbalta.  Will wean Cymbalta and change to a different antidepressant.  Depression certainly could be contributing: Trial of Trintellix.  We also discussed the possibility of untreated sleep apnea.  If symptoms are not improving with these changes and lab work is unrevealing, I recommend another sleep study.  Close follow-up recommended.  See after visit summary for instructions regarding weaning of Cymbalta and changing to Trintellix.  Her physical exam is unremarkable.  Her cognitive function is currently normal and her cognitive screen was normal in January  at her annual wellness visit.  Reviewed concept of mood problems caused by biochemical imbalance of neurotransmitters and rationale for treatment with medications and therapy.   Counseling given: pt was instructed to contact office, on-call physician or crisis Hotline if symptoms worsen significantly. If patient develops any suicidal or homicidal thoughts, she is directed to the ER immediately.   Follow up: 3 months to recheck fatigue and mood Orders Placed This Encounter  Procedures  . Lyme DiseaseAntibody(IgG), Immunoblot  . B. burgdorfi antibodies by WB  . CBC with Differential/Platelet  . TSH  . COMPLETE METABOLIC PANEL WITH GFR  . Sedimentation rate  . EKG 12-Lead     No orders of the defined types were placed in this encounter.     I reviewed the patients updated PMH, FH, and SocHx.    Patient Active Problem List   Diagnosis Date Noted  . OSA (obstructive sleep apnea), not using CPAP 07/04/2010    Priority: High  . Mixed hyperlipidemia 07/14/2007    Priority: High  . Acquired hypothyroidism 06/29/2007    Priority: High  . Major depression, recurrent, chronic ([place]) 09/25/2020    Priority: Medium  . IFG (impaired fasting glucose) 05/19/2020    Priority: Medium  . 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 Colon polyps 11/12/2019    Priority: Medium  . GAD (generalized anxiety disorder) 02/22/2014    Priority: Medium  . Diastolic dysfunction 53/66/4403    Priority: Medium  . Osteoarthritis 06/29/2007    Priority: Medium  . 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 Mild mitral regurgitation by prior echocardiogram 07/24/2010    Priority: Low  . Allergic rhinitis 06/29/2007    Priority: Low   Current Meds  Medication Sig  . atorvastatin (LIPITOR) 10 MG tablet TAKE 1 TABLET BY MOUTH EVERY DAY  . azelastine (ASTELIN) 0.1 % nasal spray Place 2 sprays into both nostrils 2 (two) times daily.  . Calcium-Magnesium-Vitamin D (CALCIUM 1200+D3 PO) Take by mouth.  . diazepam (VALIUM) 5 MG tablet TAKE 1/2 TO 1 TABLET BY MOUTH EVERY 8 HOURS AS NEEDED FOR ANXIETY  . DULoxetine (CYMBALTA) 30 MG capsule Take 2 capsules (60 mg total) by mouth daily.  [name] levothyroxine (SYNTHROID) 50 MCG tablet Take 1 tablet (50 mcg total) by mouth daily before breakfast. Every Tuesday and Thursday  . levothyroxine (SYNTHROID) 75 MCG tablet Take 6mcg every M,W,F,Sa and Su  . valACYclovir (VALTREX) 1000 MG tablet Take 2 tablets (2,000 mg total) by mouth 2 (two) times daily. Once, as needed for cold sores    Allergies: Patient is allergic to simvastatin and sulfonamide derivatives. Family  history:  Patient family history includes Arthritis in an other family member; Brain cancer in her father; Breast cancer in her maternal aunt and paternal aunt; Diabetes in her brother, father, and paternal grandfather; Heart attack in her father; Heart disease in her father; Hyperlipidemia in her father; Macular degeneration in her mother; Migraines in her daughter; Pulmonary embolism in her daughter. Social History   Socioeconomic History  . Marital status: Widowed    Spouse name: Not on file  . Number of children: Not on file  . Years of education: Not on file  . Highest education level: Not on file  Occupational History  . Not on file  Tobacco Use  . Smoking status: Never Smoker  . Smokeless tobacco: Never Used  Substance and Sexual Activity  .  Alcohol use: Yes    Comment: rare  . Drug use: No  . 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 No major dietary restrictions  Other Topics Concern  . Not on file  Social History Narrative  . Not on file   Social Determinants of Health   Financial Resource Strain:   . Difficulty of Paying Living Expenses: Not on file  Food Insecurity:   . Worried About Charity fundraiser in the Last Year: Not on file  . Ran Out of Food in the Last Year: Not on file  Transportation Needs:   . Lack of Transportation (Medical): Not on file  . Lack of Transportation (Non-Medical): Not on file  Physical Activity:   . Days of Exercise per Week: Not on file  . Minutes of Exercise per Session: Not on file  Stress:   . Feeling of Stress : Not on file  Social Connections:   . Frequency of Communication with Friends and Family: Not on file  . Frequency of Social Gatherings with Friends and Family: Not on file  . Attends Religious Services: Not on file  . Active Member of Clubs or Organizations: Not on file  . Attends Archivist Meetings: Not on file  . Marital Status: Not on file     Review  of Systems: Constitutional: Negative for fever malaise or anorexia Cardiovascular: negative for chest pain Respiratory: negative for SOB or persistent cough Gastrointestinal: negative for abdominal pain  Objective  Vitals: BP 112/70   Pulse 69   Temp (!) 96.9 F (36.1 C) (Temporal)   Resp 16   Ht 5\' 3"  (1.6 m)   Wt 152 lb 12.8 oz (69.3 kg)   SpO2 97%   BMI 27.07 kg/m  General: no acute distress, well appearing, no apparent distress, well groomed Psych:  Alert and oriented x 3,normal mood, behavior, speech, dress, and thought processes. normal affect  HEENT: Nonpalpable thyroid Cardiovascular:  RRR without murmur or gallop. no peripheral edema Respiratory:  Good breath sounds bilaterally, CTAB with normal respiratory effort Skin:  Warm, no rashes  EKG: Normal sinus rhythm, no acute changes.  Normal.  No comparison  Commons side effects, risks, benefits, and alternatives for medications and treatment plan prescribed today were discussed, and the patient expressed understanding of the given instructions. Patient is instructed to call or message via MyChart if he/she has any questions or concerns regarding our treatment plan. No barriers to understanding were identified. We discussed Red Flag symptoms and signs in detail. Patient expressed understanding regarding what to do in case of urgent or emergency type symptoms.   Medication list was reconciled, printed and provided to the patient in AVS. Patient instructions and summary information was reviewed with the patient as documented in the AVS. This note was prepared with assistance of Dragon voice recognition software. Occasional wrong-word or sound-a-like substitutions may have occurred due to the inherent limitations of voice recognition software

## 2020-09-25 NOTE — Patient Instructions (Addendum)
Please return in 3 months to recheck.   Wean off cymbalta as follows: Take 1 capsule every other day for the next 10 days.  Then decrease to 1 capsule every 3rd day for 9 days.   Start the new mood medication daily starting the on day 15 of the wean. You will overlab the two medications for the last week a few days .  If you have any questions or concerns, please don't hesitate to send me a message via MyChart or call the office at [phone]. Thank you for visiting with [place] today! It's our pleasure caring for you.   Fatigue If you have fatigue, you feel tired all the time and have a lack of energy or a lack of motivation. Fatigue may make it difficult to start or complete tasks because of exhaustion. In general, occasional or mild fatigue is often a normal response to activity or life. However, long-lasting (chronic) or extreme fatigue may be a symptom of a medical condition. Follow these instructions at home: General instructions  Watch your fatigue for any changes.  Go to bed and get up at the same time every day.  Avoid fatigue by pacing yourself during the day and getting enough sleep at night.  Maintain a healthy weight. Medicines  Take over-the-counter and prescription medicines only as told by your health care provider.  Take a multivitamin, if told by your health care provider.  Do not use herbal or dietary supplements unless they are approved by your health care provider. Activity   Exercise regularly, as told by your health care provider.  Use or practice techniques to help you relax, such as yoga, tai chi, meditation, or massage therapy. Eating and drinking   Avoid heavy meals in the evening.  Eat a well-balanced diet, which includes lean proteins, whole grains, plenty of fruits and vegetables, and low-fat dairy products.  Avoid consuming too much caffeine.  Avoid the use of alcohol.  Drink enough fluid to keep your urine pale yellow. Lifestyle  Change  situations that cause you stress. Try to keep your work and personal schedule in balance.  Do not use any products that contain nicotine or tobacco, such as cigarettes and e-cigarettes. If you need help quitting, ask your health care provider.  Do not use drugs. Contact a health care provider if:  Your fatigue does not get better.  You have a fever.  You suddenly lose or gain weight.  You have headaches.  You have trouble falling asleep or sleeping through the night.  You feel angry, guilty, anxious, or sad.  You are unable to have a bowel movement (constipation).  Your skin is dry.  You have swelling in your legs or another part of your body. Get help right away if:  You feel confused.  Your vision is blurry.  You feel faint or you pass out.  You have a severe headache.  You have severe pain in your abdomen, your back, or the area between your waist and hips (pelvis).  You have chest pain, shortness of breath, or an irregular or fast heartbeat.  You are unable to urinate, or you urinate less than normal.  You have abnormal bleeding, such as bleeding from the rectum, vagina, nose, lungs, or nipples.  You vomit blood.  You have thoughts about hurting yourself or others. If you ever feel like you may hurt yourself or others, or have thoughts about taking your own life, get help right away. You can go to your nearest  emergency department or call:  Your local emergency services (911 in the [place]  A suicide crisis helpline, such as the [place] at 478-673-6231. This is open 24 hours a day. Summary  If you have fatigue, you feel tired all the time and have a lack of energy or a lack of motivation.  Fatigue may make it difficult to start or complete tasks because of exhaustion.  Long-lasting (chronic) or extreme fatigue may be a symptom of a medical condition.  Exercise regularly, as told by your health care provider.  Change  situations that cause you stress. Try to keep your work and personal schedule in balance. This information is not intended to replace advice given to you by your health care provider. Make sure you discuss any questions you have with your health care provider. Document Revised: 07/07/2019 Document Reviewed: 09/10/2017 Elsevier Patient Education  2020 [employer].

## 2020-09-26 ENCOUNTER — Encounter: Payer: Self-pay | Admitting: Family Medicine

## 2020-09-26 MED ORDER — FLUOXETINE HCL 20 MG PO TABS: ORAL_TABLET | ORAL | 2 refills | Status: DC

## 2020-10-02 LAB — EPSTEIN-BARR VIRUS VCA ANTIBODY PANEL
EBV NA IgG: 129 U/mL — ABNORMAL HIGH
EBV VCA IgG: 725 U/mL — ABNORMAL HIGH
EBV VCA IgM: <36 U/mL

## 2020-10-02 LAB — CBC WITH DIFFERENTIAL/PLATELET
Absolute Monocytes: 410 cells/uL (ref 200–950)
Basophils Absolute: 32 cells/uL (ref 0–200)
Basophils Relative: 0.5 %
Eosinophils Absolute: 271 cells/uL (ref 15–500)
Eosinophils Relative: 4.3 %
HCT: 37.9 % (ref 35.0–45.0)
Hemoglobin: 12.2 g/dL (ref 11.7–15.5)
Lymphs Abs: 2230 cells/uL (ref 850–3900)
MCH: 30 pg (ref 27.0–33.0)
MCHC: 32.2 g/dL (ref 32.0–36.0)
MCV: 93.1 fL (ref 80.0–100.0)
MPV: 9.4 fL (ref 7.5–12.5)
Monocytes Relative: 6.5 %
Neutro Abs: 3358 cells/uL (ref 1500–7800)
Neutrophils Relative %: 53.3 %
Platelets: 313 10*3/uL (ref 140–400)
RBC: 4.07 10*6/uL (ref 3.80–5.10)
RDW: 12.8 % (ref 11.0–15.0)
Total Lymphocyte: 35.4 %
WBC: 6.3 10*3/uL (ref 3.8–10.8)

## 2020-10-02 LAB — COMPLETE METABOLIC PANEL WITH GFR
AG Ratio: 1.9 (calc) (ref 1.0–2.5)
ALT: 14 U/L (ref 6–29)
AST: 19 U/L (ref 10–35)
Albumin: 4.3 g/dL (ref 3.6–5.1)
Alkaline phosphatase (APISO): 74 U/L (ref 37–153)
BUN: 16 mg/dL (ref 7–25)
CO2: 30 mmol/L (ref 20–32)
Calcium: 9.5 mg/dL (ref 8.6–10.4)
Chloride: 105 mmol/L (ref 98–110)
Creat: 0.8 mg/dL (ref 0.60–0.93)
GFR, Est African American: 86 mL/min/{1.73_m2} (ref >=60)
GFR, Est Non African American: 74 mL/min/{1.73_m2} (ref >=60)
Globulin: 2.3 g/dL (calc) (ref 1.9–3.7)
Glucose, Bld: 104 mg/dL — ABNORMAL HIGH (ref 65–99)
Potassium: 4.7 mmol/L (ref 3.5–5.3)
Sodium: 140 mmol/L (ref 135–146)
Total Bilirubin: 0.5 mg/dL (ref 0.2–1.2)
Total Protein: 6.6 g/dL (ref 6.1–8.1)

## 2020-10-02 LAB — B. BURGDORFI ANTIBODIES BY WB

## 2020-10-02 LAB — TSH: TSH: 0.35 mIU/L — ABNORMAL LOW (ref 0.40–4.50)

## 2020-10-02 LAB — SEDIMENTATION RATE: Sed Rate: 29 mm/h (ref 0–30)

## 2020-10-10 DIAGNOSIS — R69 Illness, unspecified: Secondary | ICD-10-CM | POA: Diagnosis not present

## 2020-10-21 DIAGNOSIS — Z20822 Contact with and (suspected) exposure to covid-19: Secondary | ICD-10-CM | POA: Diagnosis not present

## 2020-10-30 ENCOUNTER — Telehealth: Payer: Self-pay

## 2020-10-30 ENCOUNTER — Other Ambulatory Visit: Payer: Self-pay

## 2020-10-30 DIAGNOSIS — R42 Dizziness and giddiness: Secondary | ICD-10-CM

## 2020-10-30 NOTE — Telephone Encounter (Signed)
Referral placed.

## 2020-10-30 NOTE — Telephone Encounter (Signed)
Pt set up an appt Nov 12 with [facility] ENT, Marnette Burgess for vertigo that she has been experiencing. Pt needs a referral for this appt. She is requesting one be sent in.

## 2020-11-01 ENCOUNTER — Ambulatory Visit: Payer: Medicare HMO | Admitting: Family Medicine

## 2020-11-01 ENCOUNTER — Other Ambulatory Visit: Payer: Self-pay | Admitting: Family Medicine

## 2020-11-05 DIAGNOSIS — Z20822 Contact with and (suspected) exposure to covid-19: Secondary | ICD-10-CM | POA: Diagnosis not present

## 2020-11-05 DIAGNOSIS — Z03818 Encounter for observation for suspected exposure to other biological agents ruled out: Secondary | ICD-10-CM | POA: Diagnosis not present

## 2020-11-05 DIAGNOSIS — J0140 Acute pansinusitis, unspecified: Secondary | ICD-10-CM | POA: Diagnosis not present

## 2020-11-21 ENCOUNTER — Ambulatory Visit: Payer: Medicare HMO | Admitting: Family Medicine

## 2020-12-05 DIAGNOSIS — H8112 Benign paroxysmal vertigo, left ear: Secondary | ICD-10-CM | POA: Diagnosis not present

## 2020-12-15 ENCOUNTER — Other Ambulatory Visit: Payer: Self-pay | Admitting: Family Medicine

## 2020-12-15 DIAGNOSIS — Z1231 Encounter for screening mammogram for malignant neoplasm of breast: Secondary | ICD-10-CM

## 2020-12-27 ENCOUNTER — Telehealth: Payer: Self-pay

## 2020-12-27 NOTE — Telephone Encounter (Signed)
Patient is requesting a Referral to [facility] [phone] for her vertigo, dizziness.

## 2020-12-28 NOTE — Telephone Encounter (Signed)
OK for referral? Please advise 

## 2020-12-28 NOTE — Telephone Encounter (Signed)
Please call: looks like Dr. Jenne Pane ENT was ordering further testing and then treatment? Did this happen?  Or does she prefer trying neuro rehab first? If so, ok to send. Thanks.

## 2020-12-28 NOTE — Telephone Encounter (Signed)
I have confirmed with Dr. Jenne Pane office that referral was sent to [facility] yesterday for PT

## 2021-01-01 ENCOUNTER — Ambulatory Visit: Payer: Medicare HMO | Admitting: Family Medicine

## 2021-01-04 ENCOUNTER — Ambulatory Visit: Payer: Medicare HMO | Attending: Otolaryngology | Admitting: Physical Therapy

## 2021-01-04 ENCOUNTER — Other Ambulatory Visit: Payer: Self-pay

## 2021-01-04 ENCOUNTER — Encounter: Payer: Self-pay | Admitting: Physical Therapy

## 2021-01-04 DIAGNOSIS — R2689 Other abnormalities of gait and mobility: Secondary | ICD-10-CM | POA: Diagnosis not present

## 2021-01-04 DIAGNOSIS — R2681 Unsteadiness on feet: Secondary | ICD-10-CM | POA: Insufficient documentation

## 2021-01-04 DIAGNOSIS — H8112 Benign paroxysmal vertigo, left ear: Secondary | ICD-10-CM

## 2021-01-04 DIAGNOSIS — R42 Dizziness and giddiness: Secondary | ICD-10-CM | POA: Insufficient documentation

## 2021-01-04 NOTE — Therapy (Signed)
[facility] [address] [place], [place], [address] Phone: [phone]   Fax:  [phone]  Physical Therapy Evaluation  Patient Details  Name: Morgan [name] MRN: [id number] Date of Birth: [birth year] Referring Provider (PT): Melida Quitter, MD   Encounter Date: 01/04/2021   PT End of Session - 01/04/21 1238    Visit Number 1    Number of Visits 13    Date for PT Re-Evaluation 02/15/21    Authorization Type [employer]    Progress Note Due on Visit 10    PT Start Time 1150    PT Stop Time 1235    PT Time Calculation (min) 45 min    Equipment Utilized During Treatment Gait belt    Activity Tolerance Patient tolerated treatment well    Behavior During Therapy [facility] for tasks assessed/performed           Past Medical History:  Diagnosis Date  . Allergy   . Anxiety and depression 02/22/2014  . Arthritis   . Depression   . Diastolic dysfunction    Negative Nuclear Stress Test 08/09/12.  [name] Elevated cholesterol   . 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 History of colonic polyps   . Hypothyroidism   . Mitral regurgitation   . OSA (obstructive sleep apnea)    Mild. No CPAP.  [name] Osteoarthritis 06/29/2007  . 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 Seasonal allergies   . Sleep apnea    mild not on CPAP  . Tinnitus     Past Surgical History:  Procedure Laterality Date  . COLONOSCOPY    . POLYPECTOMY     Colon  . REPLACEMENT TOTAL KNEE Right   . TOTAL KNEE ARTHROPLASTY Right 04/22/2007  . TOTAL KNEE ARTHROPLASTY  11/30/2012   Procedure: TOTAL KNEE ARTHROPLASTY;  Surgeon: Gearlean Alf, MD;  Location: [facility];  Service: Orthopedics;  Laterality: Left;    There were no vitals filed for this visit.    Subjective Assessment - 01/04/21 1154    Subjective Pt states in years past (>15 years) she had history of vertigo issues/attacks. Pt saw [facility] ENT for a  while and it improved. Pt would get occasional episodes of vertigo; however, within the past 4-5 months she's noticed increased symptoms when laying down in bed and turning her head to the left. Pt reports general off balance feeling. Pt reports general decrease in energy level and decreased confidence with going on walks and working out. Pt feels a 4/10 dizziness today.    Pertinent History Meniere's, (+) Randell Patient virus summer of 2021    Patient Stated Goals Return to the [facility] and get rid of overall dizziness type feeling    Currently in Pain? No/denies              [facility] PT Assessment - 01/04/21 0001      Assessment   Medical Diagnosis BPPV    Referring Provider (PT) Melida Quitter, MD    Onset Date/Surgical Date --   4-5 months   Prior Therapy None      Precautions   Precautions Fall      Restrictions   Weight Bearing Restrictions No      Balance Screen   Has the patient fallen in the past 6 months Yes    How many times? 1   Christmas Day 2021   Has the patient had a decrease in activity level because of  a fear of falling?  Yes    Is the patient reluctant to leave their home because of a fear of falling?  Yes      [place] residence    Living Arrangements Alone    Available Help at Discharge Family    Type of [place] to enter    Entrance Stairs-Number of Steps 4    Entrance Stairs-Rails Can reach both    [place] One level    [place] None      Prior Function   Level of [place] Retired    Leisure Chief of Staff, play music, adult orchestra      Observation/Other Assessments   Focus on Therapeutic Outcomes (FOTO)  46    Other Surveys  --   DFS: 46.4     Ambulation/Gait   Ambulation Distance (Feet) 330 Feet    Assistive device None    Gait Pattern Step-through pattern;Abducted- right;Decreased stride length    Ambulation Surface Level;Indoor    Gait velocity  13.78 sec = 2.38 ft/sec    Stairs Yes    Stairs Assistance 5: Supervision    Stair Management Technique Two rails;Alternating pattern    Number of Stairs 4    Height of Stairs 6      Functional Gait  Assessment   Gait assessed  Yes    Gait Level Surface Walks 20 ft in less than 7 sec but greater than 5.5 sec, uses assistive device, slower speed, mild gait deviations, or deviates 6-10 in outside of the 12 in walkway width.    Change in Gait Speed Able to smoothly change walking speed without loss of balance or gait deviation. Deviate no more than 6 in outside of the 12 in walkway width.    Gait with Horizontal Head Turns Performs head turns smoothly with slight change in gait velocity (eg, minor disruption to smooth gait path), deviates 6-10 in outside 12 in walkway width, or uses an assistive device.    Gait with Vertical Head Turns Performs task with slight change in gait velocity (eg, minor disruption to smooth gait path), deviates 6 - 10 in outside 12 in walkway width or uses assistive device    Gait and Pivot Turn Pivot turns safely in greater than 3 sec and stops with no loss of balance, or pivot turns safely within 3 sec and stops with mild imbalance, requires small steps to catch balance.    Step Over Obstacle Is able to step over one shoe box (4.5 in total height) without changing gait speed. No evidence of imbalance.    Gait with Narrow Base of Support Ambulates 4-7 steps.    Gait with Eyes Closed Walks 20 ft, slow speed, abnormal gait pattern, evidence for imbalance, deviates 10-15 in outside 12 in walkway width. Requires more than 9 sec to ambulate 20 ft.    Ambulating Backwards Walks 20 ft, uses assistive device, slower speed, mild gait deviations, deviates 6-10 in outside 12 in walkway width.    Steps Alternating feet, must use rail.    Total Score 19                  Vestibular Assessment - 01/04/21 0001      Symptom Behavior   Subjective history of current problem  Constant chronic tinnitus that has worsened over the years    Type of Dizziness  Unsteady with head/body  turns;"Funny feeling in head";Vertigo    Frequency of Dizziness Laying down    Duration of Dizziness a few seconds    Symptom Nature Motion provoked;Positional    Aggravating Factors Lying supine;Supine to sit;Looking up to the ceiling    Relieving Factors Slow movements;Head stationary   Staying still   Progression of Symptoms No change since onset    History of similar episodes Has been ongoing 10-15 years but has exacerbated in the last few months      Oculomotor Exam   Oculomotor Alignment Normal    Ocular ROM WFL    Spontaneous Absent    Gaze-induced  Absent    Head shaking Horizontal Absent    Head Shaking Vertical Absent    Smooth Pursuits Intact    Saccades Slow;Overshoots   Increase in symptoms   Comment HIT (+) on L; WFL on R      Vestibulo-Ocular Reflex   VOR 1 Head Only (x 1 viewing) Increased symptoms 6/10    VOR to Slow Head Movement Positive bilaterally    VOR Cancellation Normal      Positional Testing   Dix-Hallpike Dix-Hallpike Left;Dix-Hallpike Right    Sidelying Test Sidelying Right;Sidelying Left      Dix-Hallpike Right   Dix-Hallpike Right Duration 0    Dix-Hallpike Right Symptoms No nystagmus      Dix-Hallpike Left   Dix-Hallpike Left Duration 7    Dix-Hallpike Left Symptoms Downbeat, left rotatory nystagmus   latency ~4 sec     Sidelying Right   Sidelying Right Duration 0    Sidelying Right Symptoms No nystagmus      Sidelying Left   Sidelying Left Duration 0    Sidelying Left Symptoms No nystagmus              Objective measurements completed on examination: See above findings.        Vestibular Treatment/Exercise - 01/04/21 0001      Vestibular Treatment/Exercise   Vestibular Treatment Provided Canalith Repositioning    Canalith Repositioning Semont Procedure Left Posterior      Semont Procedure Left Posterior   Number of  Reps  2    Overall Response Symptoms Resolved                   PT Short Term Goals - 01/04/21 1244      PT SHORT TERM GOAL #1   Title Pt will be independent with home management of BPPV    Time 3    Period Weeks    Status New    Target Date 01/25/21      PT SHORT TERM GOAL #2   Title Pt will report no dizziness with all canalith positional testing    Baseline (+) L posterior canal    Time 3    Period Weeks    Status New    Target Date 01/25/21      PT SHORT TERM GOAL #3   Title PT will assess pt's 5x STS to test pt's functional strength and create goal accordingly    Time 3    Period Weeks    Status New    Target Date 01/25/21             PT Long Term Goals - 01/04/21 1245      PT LONG TERM GOAL #1   Title Pt will be independent with final HEP    Time 6    Period Weeks    Status New  Target Date 02/15/21      PT LONG TERM GOAL #2   Title Pt will have FGA of at least 24/30 for decreased fall risk    Baseline 19/30 at eval    Time 6    Period Weeks    Status New    Target Date 02/15/21      PT LONG TERM GOAL #3   Title Pt will have improved FOTO score to at least 61 and DFS <30    Baseline 46 FOTO on eval 46.4 DFS    Time 6    Period Weeks    Status New    Target Date 02/15/21      PT LONG TERM GOAL #4   Title Pt will have improved gait speed to at least 3 ft/sec for reduced fall risk    Time 6    Period Weeks    Status New    Target Date 02/15/21                  Plan - 01/04/21 1239    Clinical Impression Statement Mrs. Krahmer is a 70 y/o F presenting to OPPT with c/o ongoing dizziness and vertigo exacerbated within the last 4-5 months. Pt reports long history of previous vertiginous episodes and attacks. PMH is significant for bilat TKAs, Meniere's, and Randell Patient Virus in summer of 2021. On assessment, pt presents with (+) L posterior canal BPPV treated with Semont maneuver x 2. Pt also demos decreased balance with FGA of  19/30 placing her at high risk of falls. Pt would benefit from PT to address these issues to optimize her level of function and safety with home and community mobility.    Personal Factors and Comorbidities Age;Comorbidity 1;Comorbidity 2;Comorbidity 3+;Time since onset of injury/illness/exacerbation    Comorbidities bilat TKA, Randell Patient Virus 2021    Examination-Activity Limitations Sleep;Bend;Locomotion Level    Examination-Participation Restrictions Community Activity;Cleaning    Stability/Clinical Decision Making Evolving/Moderate complexity    Clinical Decision Making Moderate    Rehab Potential Good    PT Frequency 2x / week    PT Duration 6 weeks    PT Treatment/Interventions ADLs/Self Care Home Management;Canalith Repostioning;Cryotherapy;Electrical Stimulation;Moist Heat;Iontophoresis 4mg /ml Dexamethasone;Gait training;Stair training;Functional mobility training;Therapeutic activities;Therapeutic exercise;Balance training;Neuromuscular re-education;Manual techniques;Patient/family education;Dry needling;Taping;Vasopneumatic Device;Vestibular    PT Next Visit Plan Reassess for BPPV in all canals, canalith repositioning as needed. Go over home treatment. Please initiate gaze stabilization/vestibular HEP. Focus on balance.    PT Home Exercise Plan Provided BPPV brochure/handout    Consulted and Agree with Plan of Care Patient           Patient will benefit from skilled therapeutic intervention in order to improve the following deficits and impairments:  Abnormal gait,Difficulty walking,Dizziness,Decreased balance,Decreased mobility  Visit Diagnosis: BPPV (benign paroxysmal positional vertigo), left  Dizziness and giddiness  Unsteadiness on feet  Other abnormalities of gait and mobility     Problem List Patient Active Problem List   Diagnosis Date Noted  . Major depression, recurrent, chronic ([place]) 09/25/2020  . IFG (impaired fasting glucose) 05/19/2020  . Osteoporosis,  post-menopausal 11/12/2019  . Colon polyps 11/12/2019  . 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 GAD (generalized anxiety disorder) 02/22/2014  . Mild mitral regurgitation by prior echocardiogram 07/24/2010  . OSA (obstructive sleep apnea), not using CPAP 07/04/2010  . Diastolic dysfunction [id number]  . Mixed hyperlipidemia 07/14/2007  . Acquired hypothyroidism 06/29/2007  . Allergic rhinitis 06/29/2007  . Osteoarthritis 06/29/2007  Racer Quam [address] Johnsie Moscoso PT, DPT 01/04/2021, 12:58 PM  [facility] [address] [place], [place], [address] Phone: [phone]   Fax:  [phone]  Name: Marquasha Duplechain MRN: [id number] Date of Birth: [birth year]

## 2021-01-04 NOTE — Addendum Note (Signed)
Addended by: Jules Husbands MARIE L on: 01/04/2021 05:42 PM   Modules accepted: Orders

## 2021-01-05 ENCOUNTER — Ambulatory Visit: Payer: Medicare HMO | Admitting: Family Medicine

## 2021-01-08 ENCOUNTER — Other Ambulatory Visit: Payer: Self-pay | Admitting: Family Medicine

## 2021-01-09 ENCOUNTER — Ambulatory Visit: Payer: Medicare HMO | Admitting: Physical Therapy

## 2021-01-12 ENCOUNTER — Ambulatory Visit: Payer: Medicare HMO | Admitting: Physical Therapy

## 2021-01-12 DIAGNOSIS — Z1152 Encounter for screening for COVID-19: Secondary | ICD-10-CM | POA: Diagnosis not present

## 2021-01-16 ENCOUNTER — Ambulatory Visit: Payer: Medicare HMO | Admitting: Physical Therapy

## 2021-01-19 ENCOUNTER — Ambulatory Visit: Payer: Medicare HMO

## 2021-01-23 ENCOUNTER — Encounter: Payer: Self-pay | Admitting: Family Medicine

## 2021-01-23 ENCOUNTER — Telehealth (INDEPENDENT_AMBULATORY_CARE_PROVIDER_SITE_OTHER): Payer: Medicare HMO | Admitting: Family Medicine

## 2021-01-23 ENCOUNTER — Other Ambulatory Visit: Payer: Self-pay

## 2021-01-23 ENCOUNTER — Ambulatory Visit: Payer: Medicare HMO

## 2021-01-23 VITALS — Ht 63.0 in | Wt 152.8 lb

## 2021-01-23 DIAGNOSIS — R5383 Other fatigue: Secondary | ICD-10-CM | POA: Diagnosis not present

## 2021-01-23 DIAGNOSIS — F339 Major depressive disorder, recurrent, unspecified: Secondary | ICD-10-CM | POA: Diagnosis not present

## 2021-01-23 DIAGNOSIS — H8112 Benign paroxysmal vertigo, left ear: Secondary | ICD-10-CM | POA: Diagnosis not present

## 2021-01-23 DIAGNOSIS — F411 Generalized anxiety disorder: Secondary | ICD-10-CM

## 2021-01-23 DIAGNOSIS — U071 COVID-19: Secondary | ICD-10-CM | POA: Diagnosis not present

## 2021-01-23 DIAGNOSIS — R69 Illness, unspecified: Secondary | ICD-10-CM | POA: Diagnosis not present

## 2021-01-23 NOTE — Patient Instructions (Signed)
Please return in about 8 weeks for mood recheck. Schedule physical in May 2022.  If you have any questions or concerns, please don't hesitate to send me a message via MyChart or call the office at [phone]. Thank you for visiting with [place] today! It's our pleasure caring for you.

## 2021-01-23 NOTE — Progress Notes (Signed)
Virtual Visit via Video Note  Subjective  CC:  Chief Complaint  Patient presents with  . Headache    Post Covid; Recurrent Headache     I connected with Howell Pringle on 01/23/21 at  8:30 AM EST by a video enabled telemedicine application and verified that I am speaking with the correct person using two identifiers. Location patient: Home Location provider: [facility], Office Persons participating in the virtual visit: Madissen Wyse, Leamon Arnt, MD [place]  I discussed the limitations of evaluation and management by telemedicine and the availability of in person appointments. The patient expressed understanding and agreed to proceed. HPI: Morgan [name] is a 70 y.o. female who was contacted today to address the problems listed above in the chief complaint. Morgan [name] reports she was diagnosed with Covid about 9 days ago.  Exposed by a houseguest.  Fortunately, she has done well with mild symptoms including URI nasal congestion, headaches and initially a sore throat that is now improving.  She has had no shortness of breath, cough or GI symptoms.  No fevers.  Her appetite is good.  She calls today because she has persistent headaches.  They are mainly frontal.  They are relieved by ibuprofen which she has taken only intermittently.  She wonders if she has a sinus infection.  She denies purulent drainage.  No maxillary tenderness.  No dental pain.  She is fully vaccinated and had her booster in November. . Follow-up major depression anxiety: We last discussed this in September when we thought depression was worsening.  At that time we discussed weaning from Cymbalta and changing to Prozac.  She has not yet done this.  Due to the holidays and being busy she decided to postpone the change to January.  She feels her depression is still active but not terrible. . Fatigue: Diagnosed with recovering mono.  She feels her fatigue is  improving although not all the way better.  Again this is likely multifactorial in part due to depression.  No new symptoms have occurred.  No fevers.  We reviewed her normal lab work. . Recently diagnosed with positional vertigo and treated by ENT.  She did have neuro rehab.  Symptoms are much improved.  She will have follow-up PT for balance training.   Assessment  1. COVID-19 virus infection   2. Major depression, recurrent, chronic ([place])   3. GAD (generalized anxiety disorder)   4. Other fatigue   5. BPPV (benign paroxysmal positional vertigo), left      Plan   Covid infection: Improving overall.  Continue over-the-counter treatment.  Discussed management of headaches including increasing use of ibuprofen.  Symptoms can last for 2 to 3 weeks.  No definite signs of sinus infection at this time.  We will continue to monitor.  Depression and anxiety: Again reviewed weaning Cymbalta and titrating up Prozac.  Recommend follow-up visit in 6 to 8 weeks after starting Prozac.  Fatigue is mildly improved.  Likely recovering from mono.  BPPV: Improved after rehab.  Fall prevention discussed   I discussed the assessment and treatment plan with the patient. The patient was provided an opportunity to ask questions and all were answered. The patient agreed with the plan and demonstrated an understanding of the instructions.   The patient was advised to call back or seek an in-person evaluation if the symptoms worsen or if the condition fails to improve as anticipated. Follow  up: 8 weeks for mood follow-up: Recommend CPE visit in May to get scheduled Visit date not found  No orders of the defined types were placed in this encounter.     I reviewed the patients updated PMH, FH, and SocHx.    Patient Active Problem List   Diagnosis Date Noted  . OSA (obstructive sleep apnea), not using CPAP 07/04/2010    Priority: High  . Mixed hyperlipidemia 07/14/2007    Priority: High  . Acquired  hypothyroidism 06/29/2007    Priority: High  . Major depression, recurrent, chronic ([place]) 09/25/2020    Priority: Medium  . IFG (impaired fasting glucose) 05/19/2020    Priority: Medium  . 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 Colon polyps 11/12/2019    Priority: Medium  . GAD (generalized anxiety disorder) 02/22/2014    Priority: Medium  . Diastolic dysfunction 36/14/4315    Priority: Medium  . Osteoarthritis 06/29/2007    Priority: Medium  . 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 Mild mitral regurgitation by prior echocardiogram 07/24/2010    Priority: Low  . Allergic rhinitis 06/29/2007    Priority: Low   Current Meds  Medication Sig  . atorvastatin (LIPITOR) 10 MG tablet TAKE 1 TABLET BY MOUTH EVERY DAY  . levothyroxine (SYNTHROID) 50 MCG tablet Take 1 tablet (50 mcg total) by mouth daily before breakfast. Every Tuesday and Thursday  . levothyroxine (SYNTHROID) 75 MCG tablet TAKE 1 TABLET BY MOUTH EVERY DAY  . valACYclovir (VALTREX) 1000 MG tablet Take 2 tablets (2,000 mg total) by mouth 2 (two) times daily. Once, as needed for cold sores  . [DISCONTINUED] DULoxetine (CYMBALTA) 30 MG capsule Take 30 mg by mouth daily.    Allergies: Patient is allergic to simvastatin and sulfonamide derivatives. Family History: Patient family history includes Arthritis in an other family member; Brain cancer in her father; Breast cancer in her maternal aunt and paternal aunt; Diabetes in her brother, father, and paternal grandfather; Heart attack in her father; Heart disease in her father; Hyperlipidemia in her father; Macular degeneration in her mother; Migraines in her daughter; Pulmonary embolism in her daughter. Social History:  Patient  reports that she has never smoked. She has never used smokeless tobacco. She reports current alcohol use. She reports that she does not use drugs.  Review of  Systems: Constitutional: Negative for fever malaise or anorexia Cardiovascular: negative for chest pain Respiratory: negative for SOB or persistent cough Gastrointestinal: negative for abdominal pain  OBJECTIVE Vitals: Ht 5\' 3"  (1.6 m)   Wt 152 lb 12.5 oz (69.3 kg)   BMI 27.06 kg/m  General: no acute distress , A&Ox3, appears well, no respiratory distress  Leamon Arnt, MD

## 2021-01-25 ENCOUNTER — Ambulatory Visit
Admission: RE | Admit: 2021-01-25 | Discharge: 2021-01-25 | Disposition: A | Discharge status: Home / Self Care | Payer: Medicare HMO | Source: Ambulatory Visit | Attending: Family Medicine | Admitting: Family Medicine

## 2021-01-25 ENCOUNTER — Other Ambulatory Visit: Payer: Self-pay

## 2021-01-25 ENCOUNTER — Ambulatory Visit: Payer: Medicare HMO

## 2021-01-25 DIAGNOSIS — Z1231 Encounter for screening mammogram for malignant neoplasm of breast: Secondary | ICD-10-CM

## 2021-01-26 DIAGNOSIS — J0110 Acute frontal sinusitis, unspecified: Secondary | ICD-10-CM | POA: Diagnosis not present

## 2021-01-26 DIAGNOSIS — R030 Elevated blood-pressure reading, without diagnosis of hypertension: Secondary | ICD-10-CM | POA: Diagnosis not present

## 2021-01-30 ENCOUNTER — Ambulatory Visit: Payer: Medicare HMO

## 2021-02-06 ENCOUNTER — Ambulatory Visit: Payer: Medicare HMO | Attending: Otolaryngology

## 2021-02-06 ENCOUNTER — Other Ambulatory Visit: Payer: Self-pay

## 2021-02-06 DIAGNOSIS — R2681 Unsteadiness on feet: Secondary | ICD-10-CM | POA: Diagnosis not present

## 2021-02-06 DIAGNOSIS — R2689 Other abnormalities of gait and mobility: Secondary | ICD-10-CM | POA: Diagnosis not present

## 2021-02-06 DIAGNOSIS — R42 Dizziness and giddiness: Secondary | ICD-10-CM

## 2021-02-06 NOTE — Therapy (Signed)
[facility] [address] [place], [place], [address] Phone: [phone]   Fax:  [phone]  Physical Therapy Treatment  Patient Details  Name: Morgan [name] MRN: [id number] Date of Birth: 02-13-1951 Referring Provider (PT): Melida Quitter, MD   Encounter Date: 02/06/2021   PT End of Session - 02/06/21 1138    Visit Number 2    Number of Visits 13    Date for PT Re-Evaluation 02/15/21    Authorization Type [employer]    Progress Note Due on Visit 10    PT Start Time 1138    PT Stop Time 1222    PT Time Calculation (min) 44 min    Equipment Utilized During Treatment Gait belt    Activity Tolerance Patient tolerated treatment well    Behavior During Therapy [facility] for tasks assessed/performed           Past Medical History:  Diagnosis Date  . Allergy   . Anxiety and depression 02/22/2014  . Arthritis   . Depression   . Diastolic dysfunction    Negative Nuclear Stress Test 08/09/12.  [name] Elevated cholesterol   . 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 History of colonic polyps   . Hypothyroidism   . Mitral regurgitation   . OSA (obstructive sleep apnea)    Mild. No CPAP.  [name] Osteoarthritis 06/29/2007  . 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 Seasonal allergies   . Sleep apnea    mild not on CPAP  . Tinnitus     Past Surgical History:  Procedure Laterality Date  . COLONOSCOPY    . POLYPECTOMY     Colon  . REPLACEMENT TOTAL KNEE Right   . TOTAL KNEE ARTHROPLASTY Right 04/22/2007  . TOTAL KNEE ARTHROPLASTY  11/30/2012   Procedure: TOTAL KNEE ARTHROPLASTY;  Surgeon: Gearlean Alf, MD;  Location: [facility];  Service: Orthopedics;  Laterality: Left;    There were no vitals filed for this visit.   Subjective Assessment - 02/06/21 1140    Subjective Patient reports that she had COVID, and it has taken a couple weeks for her to feel better. Patietn reports  dizziness has been doing well since last visit.    Pertinent History Meniere's, (+) Randell Patient virus summer of 2021    Patient Stated Goals Return to the [facility] and get rid of overall dizziness type feeling    Currently in Pain? No/denies            Vestibular Assessment - 02/06/21 0001      Positional Testing   Dix-Hallpike Dix-Hallpike Left;Dix-Hallpike Right    Horizontal Canal Testing Horizontal Canal Right;Horizontal Canal Left      Dix-Hallpike Right   Dix-Hallpike Right Duration None    Dix-Hallpike Right Symptoms No nystagmus      Dix-Hallpike Left   Dix-Hallpike Left Duration None    Dix-Hallpike Left Symptoms No nystagmus      Horizontal Canal Right   Horizontal Canal Right Duration None    Horizontal Canal Right Symptoms Normal      Horizontal Canal Left   Horizontal Canal Left Duration None    Horizontal Canal Left Symptoms Normal          PT educated patient on [place] and Epley maneuver for treatment of BPPV at home in case that patient has episode upon discharge. PT provided demonstration (with patient vidoe PT for future reference) and handout for proper completion  Completed all of the following exercises as established initial HEP focused on balance. Patient tolerating well. Patient requesting to purchase foam pad, with PT educating on options for purchase.    Access Code: [facility] URL: [email or web address] Date: 02/06/2021 Prepared by: Baldomero Lamy  Exercises Standing Balance with Eyes Closed on Foam - 1 x daily - 5 x weekly - 1 sets - 3 reps - 30 hold Romberg Stance on Foam Pad with Head Rotation - 1 x daily - 5 x weekly - 2 sets - 10 reps Romberg Stance with Head Nods on Foam Pad - 1 x daily - 5 x weekly - 2 sets - 10 reps Tandem Stance - 1 x daily - 5 x weekly - 1 sets - 3 reps - 30 hold Tandem Walking with Counter Support - 1 x daily - 5 x weekly - 1 sets - 5 reps      PT Education - 02/06/21 1145    Education Details  Educated on Self Treatment of BPPV; Initial HEP    Person(s) Educated Patient    Methods Explanation;Demonstration;Handout    Comprehension Verbalized understanding;Returned demonstration            PT Short Term Goals - 02/06/21 1227      PT SHORT TERM GOAL #1   Title Pt will be independent with home management of BPPV    Baseline reports independence    Time 3    Period Weeks    Status Achieved    Target Date 01/25/21      PT SHORT TERM GOAL #2   Title Pt will report no dizziness with all canalith positional testing    Baseline (+) L posterior canal; no dizziness with positional testing    Time 3    Period Weeks    Status Achieved    Target Date 01/25/21      PT SHORT TERM GOAL #3   Title PT will assess pt's 5x STS to test pt's functional strength and create goal accordingly    Time 3    Period Weeks    Status Deferred    Target Date 01/25/21             PT Long Term Goals - 01/04/21 1245      PT LONG TERM GOAL #1   Title Pt will be independent with final HEP    Time 6    Period Weeks    Status New    Target Date 02/15/21      PT LONG TERM GOAL #2   Title Pt will have FGA of at least 24/30 for decreased fall risk    Baseline 19/30 at eval    Time 6    Period Weeks    Status New    Target Date 02/15/21      PT LONG TERM GOAL #3   Title Pt will have improved FOTO score to at least 61 and DFS <30    Baseline 46 FOTO on eval 46.4 DFS    Time 6    Period Weeks    Status New    Target Date 02/15/21      PT LONG TERM GOAL #4   Title Pt will have improved gait speed to at least 3 ft/sec for reduced fall risk    Time 6    Period Weeks    Status New    Target Date 02/15/21  Plan - 02/06/21 1226    Clinical Impression Statement Reassessed BPPV today iwth no symptoms/nystagmus with positional testing. PT educating on self management of BPPV in case symptoms arise in the future upon discharge. Rest of session spent establishing  initial HEP focused on balance, as patient is interested in working on balance outside of therapy as returning to gym.    Personal Factors and Comorbidities Age;Comorbidity 1;Comorbidity 2;Comorbidity 3+;Time since onset of injury/illness/exacerbation    Comorbidities bilat TKA, Randell Patient Virus 2021    Examination-Activity Limitations Sleep;Bend;Locomotion Level    Examination-Participation Restrictions Community Activity;Cleaning    Stability/Clinical Decision Making Evolving/Moderate complexity    Rehab Potential Good    PT Frequency 2x / week    PT Duration 6 weeks    PT Treatment/Interventions ADLs/Self Care Home Management;Canalith Repostioning;Cryotherapy;Electrical Stimulation;Moist Heat;Iontophoresis 4mg /ml Dexamethasone;Gait training;Stair training;Functional mobility training;Therapeutic activities;Therapeutic exercise;Balance training;Neuromuscular re-education;Manual techniques;Patient/family education;Dry needling;Taping;Vasopneumatic Device;Vestibular    PT Next Visit Plan How was HEP? Add more balnace exercises as needed. Patient may want to d/c and work on balance at home/gym.    PT Home Exercise Plan Provided BPPV brochure/handout    Consulted and Agree with Plan of Care Patient           Patient will benefit from skilled therapeutic intervention in order to improve the following deficits and impairments:  Abnormal gait,Difficulty walking,Dizziness,Decreased balance,Decreased mobility  Visit Diagnosis: Dizziness and giddiness  Other abnormalities of gait and mobility  Unsteadiness on feet     Problem List Patient Active Problem List   Diagnosis Date Noted  . Major depression, recurrent, chronic ([place]) 09/25/2020  . IFG (impaired fasting glucose) 05/19/2020  . 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 Colon polyps 11/12/2019  . 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 GAD (generalized anxiety disorder) 02/22/2014  . Mild mitral  regurgitation by prior echocardiogram 07/24/2010  . OSA (obstructive sleep apnea), not using CPAP 07/04/2010  . Diastolic dysfunction 64/40/3474  . Mixed hyperlipidemia 07/14/2007  . Acquired hypothyroidism 06/29/2007  . Allergic rhinitis 06/29/2007  . Osteoarthritis 06/29/2007    Jones Bales, PT, DPT 02/06/2021, 12:29 PM  [facility] [address] [place], [place], [address] Phone: [phone]   Fax:  [phone]  Name: Querida Beretta MRN: [id number] Date of Birth: [birth year]

## 2021-02-06 NOTE — Patient Instructions (Addendum)
How to Perform the Epley Maneuver The Epley maneuver is an exercise that relieves symptoms of vertigo. Vertigo is the feeling that you or your surroundings are moving when they are not. When you feel vertigo, you may feel like the room is spinning and may have trouble walking. The Epley maneuver is used for a type of vertigo caused by a calcium deposit in a part of the inner ear. The maneuver involves changing head positions to help the deposit move out of the area. You can do this maneuver at home whenever you have symptoms of vertigo. You can repeat it in 24 hours if your vertigo has not gone away. Even though the Epley maneuver may relieve your vertigo for a few weeks, it is possible that your symptoms will return. This maneuver relieves vertigo, but it does not relieve dizziness. What are the risks? If it is done correctly, the Epley maneuver is considered safe. Sometimes it can lead to dizziness or nausea that goes away after a short time. If you develop other symptoms-such as changes in vision, weakness, or numbness-stop doing the maneuver and call your health care provider. Supplies needed:  A bed or table.  A pillow. How to do the Epley maneuver 1. Sit on the edge of a bed or table with your back straight and your legs extended or hanging over the edge of the bed or table. 2. Turn your head halfway toward the affected ear or side as told by your health care provider. 3. Lie backward quickly with your head turned until you are lying flat on your back. You may want to position a pillow under your shoulders. 4. Hold this position for at least 30 seconds. If you feel dizzy or have symptoms of vertigo, continue to hold the position until the symptoms stop. 5. Turn your head to the opposite direction until your unaffected ear is facing the floor. 6. Hold this position for at least 30 seconds. If you feel dizzy or have symptoms of vertigo, continue to hold the position until the symptoms  stop. 7. Turn your whole body to the same side as your head so that you are positioned on your side. Your head will now be nearly facedown. Hold for at least 30 seconds. If you feel dizzy or have symptoms of vertigo, continue to hold the position until the symptoms stop. 8. Sit back up. You can repeat the maneuver in 24 hours if your vertigo does not go away.      Follow these instructions at home: For 24 hours after doing the Epley maneuver:  Keep your head in an upright position.  When lying down to sleep or rest, keep your head raised (elevated) with two or more pillows.  Avoid excessive neck movements. Activity  Do not drive or use machinery if you feel dizzy.  After doing the Epley maneuver, return to your normal activities as told by your health care provider. Ask your health care provider what activities are safe for you. General instructions  Drink enough fluid to keep your urine pale yellow.  Do not drink alcohol.  Take over-the-counter and prescription medicines only as told by your health care provider.  Keep all follow-up visits as told by your health care provider. This is important. Preventing vertigo symptoms Ask your health care provider if there is anything you should do at home to prevent vertigo. He or she may recommend that you:  Keep your head elevated with two or more pillows while you sleep.  Do not sleep on the side of your affected ear.  Get up slowly from bed.  Avoid sudden movements during the day.  Avoid extreme head positions or movement, such as looking up or bending over. Contact a health care provider if:  Your vertigo gets worse.  You have other symptoms, including: ? Nausea. ? Vomiting. ? Headache. Get help right away if you:  Have vision changes.  Have a headache or neck pain that is severe or getting worse.  Cannot stop vomiting.  Have new numbness or weakness in any part of your body. Summary  Vertigo is the feeling that  you or your surroundings are moving when they are not.  The Epley maneuver is an exercise that relieves symptoms of vertigo.  If the Epley maneuver is done correctly, it is considered safe and relieves vertigo quickly. This information is not intended to replace advice given to you by your health care provider. Make sure you discuss any questions you have with your health care provider. Document Revised: 10/13/2019 Document Reviewed: 10/13/2019 Elsevier Patient Education  [name].    Access Code: [facility] URL: [email or web address] Date: 02/06/2021 Prepared by: Baldomero Lamy  Exercises Standing Balance with Eyes Closed on Foam - 1 x daily - 5 x weekly - 1 sets - 3 reps - 30 hold Romberg Stance on Foam Pad with Head Rotation - 1 x daily - 5 x weekly - 2 sets - 10 reps Romberg Stance with Head Nods on Foam Pad - 1 x daily - 5 x weekly - 2 sets - 10 reps Tandem Stance - 1 x daily - 5 x weekly - 1 sets - 3 reps - 30 hold Tandem Walking with Counter Support - 1 x daily - 5 x weekly - 1 sets - 5 reps

## 2021-02-09 ENCOUNTER — Ambulatory Visit: Payer: Medicare HMO

## 2021-02-16 ENCOUNTER — Ambulatory Visit (INDEPENDENT_AMBULATORY_CARE_PROVIDER_SITE_OTHER): Payer: Medicare HMO | Admitting: Family Medicine

## 2021-02-16 ENCOUNTER — Encounter: Payer: Self-pay | Admitting: Family Medicine

## 2021-02-16 ENCOUNTER — Ambulatory Visit: Payer: Medicare HMO

## 2021-02-16 VITALS — BP 152/80 | HR 72 | Temp 98.1°F | Wt 151.6 lb

## 2021-02-16 DIAGNOSIS — R413 Other amnesia: Secondary | ICD-10-CM | POA: Diagnosis not present

## 2021-02-16 DIAGNOSIS — Z8616 Personal history of COVID-19: Secondary | ICD-10-CM

## 2021-02-16 DIAGNOSIS — F339 Major depressive disorder, recurrent, unspecified: Secondary | ICD-10-CM | POA: Diagnosis not present

## 2021-02-16 DIAGNOSIS — J3489 Other specified disorders of nose and nasal sinuses: Secondary | ICD-10-CM | POA: Diagnosis not present

## 2021-02-16 DIAGNOSIS — Z8489 Family history of other specified conditions: Secondary | ICD-10-CM

## 2021-02-16 DIAGNOSIS — R519 Headache, unspecified: Secondary | ICD-10-CM | POA: Diagnosis not present

## 2021-02-16 DIAGNOSIS — G8929 Other chronic pain: Secondary | ICD-10-CM

## 2021-02-16 DIAGNOSIS — R2689 Other abnormalities of gait and mobility: Secondary | ICD-10-CM

## 2021-02-16 DIAGNOSIS — R69 Illness, unspecified: Secondary | ICD-10-CM | POA: Diagnosis not present

## 2021-02-16 MED ORDER — AMITRIPTYLINE HCL 25 MG PO TABS: 25.0000 mg | ORAL_TABLET | Freq: Every day | ORAL | 2 refills | Status: DC

## 2021-02-16 NOTE — Patient Instructions (Signed)
Please follow up as scheduled for your next visit with me: 04/30/2021   I have ordered a brain MRI and a sinus CT scan. We will call you to get you scheduled. We will likely first need approval from your insurance company.  Stop the tylenol and advil daily. Start elavil nightly.    If you have any questions or concerns, please don't hesitate to send me a message via MyChart or call the office at [phone]. Thank you for visiting with [place] today! It's our pleasure caring for you.

## 2021-02-16 NOTE — Progress Notes (Signed)
Subjective  CC:  Chief Complaint  Patient presents with  . Nasal Congestion    Post covid, given antibiotic from [facility] with little relief, green/bloody mucus, is currently weaning off Duloxetine and starting Prozac Sunday - not sure if this is what is causing headaches    HPI: Morgan [name] is a 70 y.o. female who presents to the office today to address the problems listed above in the chief complaint.  70 year old female who still complains of not feeling well.  In actuality, she says she has not felt well since last June.  Please refer to last office visit.  Reports fell back around Christmas and sustained contusions and abrasions to nose.  Since she has had some frontal headaches.  She contracted Covid in early December.  Had typical Covid respiratory and URI symptoms.  However she felt she was having more sinus headaches.  [facility].  She reports the antibiotic did not change her headaches.  She is very concerned because she is having daily headaches that are frontal and around her sinuses.  She denies purulent drainage at this point.  She is taking Mucinex.  She has no fevers or chills.  She has no history of chronic headaches.  They are not unilateral or associated with nausea, vomiting, photophobia or phonophobia.  She is taking daily Advil and Tylenol at this point.  Headaches always rebound.  She is very concerned about brain cancer.  Her father died of a brain tumor.  She also reports that her memory is worse than normal, her balance is worse than normal.  She had a recent bout of vertigo that was improving with vestibular rehab.  She admits to persistent and worsening depression.  We are currently weaning off duloxetine and to change to Prozac. Assessment  1. Chronic nonintractable headache, unspecified headache type   2. Major depression, recurrent, chronic (HCC) Chronic  3. History of COVID-19   4. Poor balance   5. Family  history of brain tumor   6. Poor memory   7. Sinus pain      Plan   Symptom complex of headaches, recent Covid infection, poor memory, worsening depression, worsening balance: Likely multifactorial but need to rule out brain pathology.  Patient worries that she has sinus infection as well.  MRI of brain ordered.  She is high risk given age and worsening headaches and also with worsening balance.  Start Elavil nightly.  Could help chronic daily headaches.  Recommend stopping Advil and Tylenol daily.  Could be rebound headaches, also could be related to postconcussion headache given it started with a fall.  These headaches could be related to COVID.  I discussed all of this with patient.  Await imaging study and further eval pending that.  Recent lab work is been normal.  Depression: This has been active.  Continue medium duloxetine and change to Prozac.  Symptoms above certainly could be related to active depressive somatic complaints.  Follow up: Follow-up as scheduled 04/30/2021  Orders Placed This Encounter  Procedures  . MR BRAIN W WO CONTRAST  . CT Maxillofacial WO CM   Meds ordered this encounter  Medications  . amitriptyline (ELAVIL) 25 MG tablet    Sig: Take 1 tablet (25 mg total) by mouth at bedtime.    Dispense:  30 tablet    Refill:  2      I reviewed the patients updated PMH, FH, and SocHx.  Patient Active Problem List   Diagnosis Date Noted  . OSA (obstructive sleep apnea), not using CPAP 07/04/2010    Priority: High  . Mixed hyperlipidemia 07/14/2007    Priority: High  . Acquired hypothyroidism 06/29/2007    Priority: High  . Major depression, recurrent, chronic ([place]) 09/25/2020    Priority: Medium  . IFG (impaired fasting glucose) 05/19/2020    Priority: Medium  . 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 Colon polyps 11/12/2019    Priority: Medium  . GAD (generalized anxiety disorder) 02/22/2014    Priority: Medium  . Diastolic  dysfunction 16/06/3709    Priority: Medium  . Osteoarthritis 06/29/2007    Priority: Medium  . 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 Mild mitral regurgitation by prior echocardiogram 07/24/2010    Priority: Low  . Allergic rhinitis 06/29/2007    Priority: Low   Current Meds  Medication Sig  . amitriptyline (ELAVIL) 25 MG tablet Take 1 tablet (25 mg total) by mouth at bedtime.  [name] atorvastatin (LIPITOR) 10 MG tablet TAKE 1 TABLET BY MOUTH EVERY DAY  . azelastine (ASTELIN) 0.1 % nasal spray Place 2 sprays into both nostrils 2 (two) times daily.  . diazepam (VALIUM) 5 MG tablet TAKE 1/2 TO 1 TABLET BY MOUTH EVERY 8 HOURS AS NEEDED FOR ANXIETY  . FLUoxetine (PROZAC) 20 MG tablet Take 0.5 tablets (10 mg total) by mouth daily for 7 days, THEN 1 tablet (20 mg total) daily.  [name] levothyroxine (SYNTHROID) 50 MCG tablet Take 1 tablet (50 mcg total) by mouth daily before breakfast. Every Tuesday and Thursday  . levothyroxine (SYNTHROID) 75 MCG tablet TAKE 1 TABLET BY MOUTH EVERY DAY  . valACYclovir (VALTREX) 1000 MG tablet Take 2 tablets (2,000 mg total) by mouth 2 (two) times daily. Once, as needed for cold sores  . [DISCONTINUED] acyclovir (ZOVIRAX) 400 MG tablet Take 400 mg by mouth 3 (three) times daily.    Allergies: Patient is allergic to simvastatin and sulfonamide derivatives. Family History: Patient family history includes Arthritis in an other family member; Brain cancer in her father; Breast cancer in her maternal aunt and paternal aunt; Diabetes in her brother, father, and paternal grandfather; Heart attack in her father; Heart disease in her father; Hyperlipidemia in her father; Macular degeneration in her mother; Migraines in her daughter; Pulmonary embolism in her daughter. Social History:  Patient  reports that she has never smoked. She has never used smokeless tobacco. She reports current alcohol use. She reports that she  does not use drugs.  Review of Systems: Constitutional: Negative for fever malaise or anorexia Cardiovascular: negative for chest pain Respiratory: negative for SOB or persistent cough Gastrointestinal: negative for abdominal pain  Objective  Vitals: BP (!) 152/80   Pulse 72   Temp 98.1 F (36.7 C) (Temporal)   Wt 151 lb 9.6 oz (68.8 kg)   SpO2 98%   BMI 26.85 kg/m  General: no acute distress , A&Ox3, appears well HEENT: PEERL, conjunctiva normal, neck is supple Cardiovascular:  RRR without murmur or gallop.  Respiratory:  Good breath sounds bilaterally, CTAB with normal respiratory effort Skin:  Warm, no rashes Neuro: Cranial nerves II through XII are intact and normal, nonfocal exam.  She has difficulty trying to walk heel-to-toe.  No visits with results within 1 Day(s) from this visit.  Latest known visit with results is:  Office Visit on 09/25/2020  Component Date Value Ref Range Status  . B burgdorferi IgG Abs (IB) 09/25/2020 NEGATIVE  NEGATIVE Final  . Lyme Disease 18 kD IgG 09/25/2020 NON-REACTIVE   Final  . Lyme Disease 23 kD IgG 09/25/2020 NON-REACTIVE   Final  . Lyme Disease 28 kD IgG 09/25/2020 NON-REACTIVE   Final  . Lyme Disease 30 kD IgG 09/25/2020 NON-REACTIVE   Final  . Lyme Disease 39 kD IgG 09/25/2020 NON-REACTIVE   Final  . Lyme Disease 41 kD IgG 09/25/2020 NON-REACTIVE   Final  . Lyme Disease 45 kD IgG 09/25/2020 NON-REACTIVE   Final  . Lyme Disease 58 kD IgG 09/25/2020 NON-REACTIVE   Final  . Lyme Disease 66 kD IgG 09/25/2020 NON-REACTIVE   Final  . Lyme Disease 93 kD IgG 09/25/2020 NON-REACTIVE   Final  . B burgdorferi IgM Abs (IB) 09/25/2020 NEGATIVE  NEGATIVE Final  . Lyme Disease 23 kD IgM 09/25/2020 NON-REACTIVE   Final  . Lyme Disease 39 kD IgM 09/25/2020 NON-REACTIVE   Final  . Lyme Disease 41 kD IgM 09/25/2020 NON-REACTIVE   Final  . WBC 09/25/2020 6.3  3.8 - 10.8 Thousand/uL Final  . RBC 09/25/2020 4.07  3.80 - 5.10 Million/uL Final  .  Hemoglobin 09/25/2020 12.2  11.7 - 15.5 g/dL Final  . HCT 09/25/2020 37.9  35.0 - 45.0 % Final  . MCV 09/25/2020 93.1  80.0 - 100.0 fL Final  . MCH 09/25/2020 30.0  27.0 - 33.0 pg Final  . MCHC 09/25/2020 32.2  32.0 - 36.0 g/dL Final  . RDW 09/25/2020 12.8  11.0 - 15.0 % Final  . Platelets 09/25/2020 313  140 - 400 Thousand/uL Final  . MPV 09/25/2020 9.4  7.5 - 12.5 fL Final  . Neutro Abs 09/25/2020 3,358  1,500 - 7,800 cells/uL Final  . Lymphs Abs 09/25/2020 2,230  850 - 3,900 cells/uL Final  . Absolute Monocytes 09/25/2020 410  200 - 950 cells/uL Final  . Eosinophils Absolute 09/25/2020 271  15 - 500 cells/uL Final  . Basophils Absolute 09/25/2020 32  0 - 200 cells/uL Final  . Neutrophils Relative % 09/25/2020 53.3  % Final  . Total Lymphocyte 09/25/2020 35.4  % Final  . Monocytes Relative 09/25/2020 6.5  % Final  . Eosinophils Relative 09/25/2020 4.3  % Final  . Basophils Relative 09/25/2020 0.5  % Final  . TSH 09/25/2020 0.35* 0.40 - 4.50 mIU/L Final  . Glucose, Bld 09/25/2020 104* 65 - 99 mg/dL Final  . BUN 09/25/2020 16  7 - 25 mg/dL Final  . Creat 09/25/2020 0.80  0.60 - 0.93 mg/dL Final  . GFR, Est Non African American 09/25/2020 74  > OR = 60 mL/min/1.62m2 Final  . GFR, Est African American 09/25/2020 86  > OR = 60 mL/min/1.42m2 Final  . BUN/Creatinine Ratio 16/09/9603 NOT APPLICABLE  6 - 22 (calc) Final  . Sodium 09/25/2020 140  135 - 146 mmol/L Final  . Potassium 09/25/2020 4.7  3.5 - 5.3 mmol/L Final  . Chloride 09/25/2020 105  98 - 110 mmol/L Final  . CO2 09/25/2020 30  20 - 32 mmol/L Final  . Calcium 09/25/2020 9.5  8.6 - 10.4 mg/dL Final  . Total Protein 09/25/2020 6.6  6.1 - 8.1 g/dL Final  . Albumin 09/25/2020 4.3  3.6 - 5.1 g/dL Final  . Globulin 09/25/2020 2.3  1.9 - 3.7 g/dL (calc) Final  . AG Ratio 09/25/2020 1.9  1.0 - 2.5 (calc) Final  . Total Bilirubin 09/25/2020 0.5  0.2 -  1.2 mg/dL Final  . Alkaline phosphatase (APISO) 09/25/2020 74  37 - 153 U/L Final  .  AST 09/25/2020 19  10 - 35 U/L Final  . ALT 09/25/2020 14  6 - 29 U/L Final  . Sed Rate 09/25/2020 29  0 - 30 mm/h Final  . EBV VCA IgM 09/25/2020 <36.00  U/mL Final  . EBV VCA IgG 09/25/2020 725.00* U/mL Final  . EBV NA IgG 09/25/2020 129.00* U/mL Final  . Interpretation 09/25/2020    Final      Commons side effects, risks, benefits, and alternatives for medications and treatment plan prescribed today were discussed, and the patient expressed understanding of the given instructions. Patient is instructed to call or message via MyChart if he/she has any questions or concerns regarding our treatment plan. No barriers to understanding were identified. We discussed Red Flag symptoms and signs in detail. Patient expressed understanding regarding what to do in case of urgent or emergency type symptoms.   Medication list was reconciled, printed and provided to the patient in AVS. Patient instructions and summary information was reviewed with the patient as documented in the AVS. This note was prepared with assistance of Dragon voice recognition software. Occasional wrong-word or sound-a-like substitutions may have occurred due to the inherent limitations of voice recognition software  This visit occurred during the SARS-CoV-2 public health emergency.  Safety protocols were in place, including screening questions prior to the visit, additional usage of staff PPE, and extensive cleaning of exam room while observing appropriate contact time as indicated for disinfecting solutions.

## 2021-03-01 ENCOUNTER — Telehealth (INDEPENDENT_AMBULATORY_CARE_PROVIDER_SITE_OTHER): Payer: Medicare HMO | Admitting: Family Medicine

## 2021-03-01 ENCOUNTER — Encounter: Payer: Self-pay | Admitting: Family Medicine

## 2021-03-01 VITALS — Ht 63.0 in | Wt 151.0 lb

## 2021-03-01 DIAGNOSIS — R519 Headache, unspecified: Secondary | ICD-10-CM | POA: Diagnosis not present

## 2021-03-01 DIAGNOSIS — F339 Major depressive disorder, recurrent, unspecified: Secondary | ICD-10-CM

## 2021-03-01 DIAGNOSIS — F411 Generalized anxiety disorder: Secondary | ICD-10-CM

## 2021-03-01 DIAGNOSIS — G8929 Other chronic pain: Secondary | ICD-10-CM | POA: Diagnosis not present

## 2021-03-01 DIAGNOSIS — R69 Illness, unspecified: Secondary | ICD-10-CM | POA: Diagnosis not present

## 2021-03-01 MED ORDER — AMITRIPTYLINE HCL 25 MG PO TABS
12.5000 mg | ORAL_TABLET | Freq: Every day | ORAL | 2 refills | Status: DC
Start: 2021-03-01 — End: 2021-04-30

## 2021-03-01 NOTE — Progress Notes (Signed)
Virtual Visit via Video Note  Subjective  CC:  Chief Complaint  Patient presents with  . Headache  . medication changes    Prozac 20mg  for one week with small chances      I connected with Howell Pringle on 03/01/21 at 11:30 AM EST by a video enabled telemedicine application and verified that I am speaking with the correct person using two identifiers. Location patient: Home Location provider: [facility] [place], Office Persons participating in the virtual visit: Malisa Ruggiero, Leamon Arnt, MD Reymundo Poll CMA  I discussed the limitations of evaluation and management by telemedicine and the availability of in person appointments. The patient expressed understanding and agreed to proceed. HPI: Morgan [name] is a 70 y.o. female who was contacted today to address the problems listed above in the chief complaint. . See last note.  This is a short-term follow-up.  Being evaluated for chronic daily headaches with family history of brain cancer.  Also with sinus symptoms.  She notes that she still has daily headaches although perhaps less intense.  She called today because last week she had 2 atypical headaches described as sharp pains on the right occipital area that lasted for 1 to 2 days.  No associated neurologic changes.  The quality was different from her typical daily frontal headache.  She has had no recurrence.  She questions whether it is related to the change of her medication.  She has successfully weaned off duloxetine and now is on Prozac.  She seems to be tolerating this well.  Although, it is affecting her sleep.  She has been taking it at night.  She tried Elavil 25 mg nightly but this made her too sleepy so she stopped it.  She has only used Tylenol intermittently.  No longer using daily medications for pain.  Her brain MRI and sinus CT are scheduled for March 14.  She is in no new neurologic symptoms.  No fevers, neck stiffness or  worsening headaches.  Review of systems is negative for change in vertigo, nausea or vomiting   Assessment  1. Chronic nonintractable headache, unspecified headache type   2. Major depression, recurrent, chronic ([place])   3. GAD (generalized anxiety disorder)      Plan   Chronic headaches: Undergoing evaluation with brain imaging and sinus CT.  Reassured.  New headache could be related to Prozac or other.  No red flag symptoms identified today.  Continue current treatment plan but will change Elavil to 12.5 mg nightly to see if she is able to tolerate that.  Depression anxiety: Successfully weaned off duloxetine.  Tolerating Prozac but recommend taking it daily so will interfere with her sleep.  Monitor for headaches.   I discussed the assessment and treatment plan with the patient. The patient was provided an opportunity to ask questions and all were answered. The patient agreed with the plan and demonstrated an understanding of the instructions.   The patient was advised to call back or seek an in-person evaluation if the symptoms worsen or if the condition fails to improve as anticipated. Follow up: As scheduled 04/30/2021  Meds ordered this encounter  Medications  . amitriptyline (ELAVIL) 25 MG tablet    Sig: Take 0.5 tablets (12.5 mg total) by mouth at bedtime.    Dispense:  30 tablet    Refill:  2      I reviewed the patients updated PMH, FH, and SocHx.  Patient Active Problem List   Diagnosis Date Noted  . OSA (obstructive sleep apnea), not using CPAP 07/04/2010    Priority: High  . Mixed hyperlipidemia 07/14/2007    Priority: High  . Acquired hypothyroidism 06/29/2007    Priority: High  . Major depression, recurrent, chronic ([place]) 09/25/2020    Priority: Medium  . IFG (impaired fasting glucose) 05/19/2020    Priority: Medium  . 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 Colon polyps 11/12/2019    Priority: Medium  . GAD (generalized anxiety  disorder) 02/22/2014    Priority: Medium  . Diastolic dysfunction 63/84/6659    Priority: Medium  . Osteoarthritis 06/29/2007    Priority: Medium  . 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 Mild mitral regurgitation by prior echocardiogram 07/24/2010    Priority: Low  . Allergic rhinitis 06/29/2007    Priority: Low   Current Meds  Medication Sig  . atorvastatin (LIPITOR) 10 MG tablet TAKE 1 TABLET BY MOUTH EVERY DAY  . azelastine (ASTELIN) 0.1 % nasal spray Place 2 sprays into both nostrils 2 (two) times daily.  . diazepam (VALIUM) 5 MG tablet TAKE 1/2 TO 1 TABLET BY MOUTH EVERY 8 HOURS AS NEEDED FOR ANXIETY  . FLUoxetine (PROZAC) 20 MG tablet Take 0.5 tablets (10 mg total) by mouth daily for 7 days, THEN 1 tablet (20 mg total) daily.  [name] levothyroxine (SYNTHROID) 50 MCG tablet Take 1 tablet (50 mcg total) by mouth daily before breakfast. Every Tuesday and Thursday  . levothyroxine (SYNTHROID) 75 MCG tablet TAKE 1 TABLET BY MOUTH EVERY DAY  . valACYclovir (VALTREX) 1000 MG tablet Take 2 tablets (2,000 mg total) by mouth 2 (two) times daily. Once, as needed for cold sores  . [DISCONTINUED] amitriptyline (ELAVIL) 25 MG tablet Take 1 tablet (25 mg total) by mouth at bedtime.    Allergies: Patient is allergic to simvastatin and sulfonamide derivatives. Family History: Patient family history includes Arthritis in an other family member; Brain cancer in her father; Breast cancer in her maternal aunt and paternal aunt; Diabetes in her brother, father, and paternal grandfather; Heart attack in her father; Heart disease in her father; Hyperlipidemia in her father; Macular degeneration in her mother; Migraines in her daughter; Pulmonary embolism in her daughter. Social History:  Patient  reports that she has never smoked. She has never used smokeless tobacco. She reports current alcohol use. She reports that she does not use  drugs.  Review of Systems: Constitutional: Negative for fever malaise or anorexia Cardiovascular: negative for chest pain Respiratory: negative for SOB or persistent cough Gastrointestinal: negative for abdominal pain  OBJECTIVE Vitals: Ht 5\' 3"  (1.6 m)   Wt 151 lb (68.5 kg)   BMI 26.75 kg/m  General: no acute distress , A&Ox3, appears well, normal speech  Leamon Arnt, MD

## 2021-03-12 ENCOUNTER — Ambulatory Visit
Admission: RE | Admit: 2021-03-12 | Discharge: 2021-03-12 | Disposition: A | Discharge status: Home / Self Care | Payer: Medicare HMO | Source: Ambulatory Visit | Attending: Family Medicine | Admitting: Family Medicine

## 2021-03-12 ENCOUNTER — Encounter: Payer: Self-pay | Admitting: Family Medicine

## 2021-03-12 DIAGNOSIS — J322 Chronic ethmoidal sinusitis: Secondary | ICD-10-CM | POA: Diagnosis not present

## 2021-03-12 DIAGNOSIS — Z8489 Family history of other specified conditions: Secondary | ICD-10-CM

## 2021-03-12 DIAGNOSIS — J3489 Other specified disorders of nose and nasal sinuses: Secondary | ICD-10-CM

## 2021-03-12 DIAGNOSIS — G8929 Other chronic pain: Secondary | ICD-10-CM

## 2021-03-12 DIAGNOSIS — R413 Other amnesia: Secondary | ICD-10-CM

## 2021-03-12 DIAGNOSIS — R519 Headache, unspecified: Secondary | ICD-10-CM

## 2021-03-12 DIAGNOSIS — J321 Chronic frontal sinusitis: Secondary | ICD-10-CM | POA: Diagnosis not present

## 2021-03-12 DIAGNOSIS — R2689 Other abnormalities of gait and mobility: Secondary | ICD-10-CM

## 2021-03-12 DIAGNOSIS — J0110 Acute frontal sinusitis, unspecified: Secondary | ICD-10-CM | POA: Diagnosis not present

## 2021-03-12 MED ORDER — GADOBENATE DIMEGLUMINE 529 MG/ML IV SOLN
14.0000 mL | Freq: Once | INTRAVENOUS | Status: AC | PRN
  Administered 2021-03-12: 14 mL via INTRAVENOUS

## 2021-03-15 NOTE — Therapy (Signed)
[facility] [address] [place], [place], [address] Phone: [phone]   Fax:  [phone]  Patient Details  Name: Morgan [name] MRN: [id number] Date of Birth: 08/24/1951 Referring Provider:  No ref. provider found  Encounter Date: 03/15/2021  PHYSICAL THERAPY NON VISIT DISCHARGE SUMMARY  Visits from Start of Care: 2  Current functional level related to goals / functional outcomes: Patient requesting to be d/c from PT services at this time. Reports will obtain new order and return if feels this is necessary.    Remaining deficits: Dizziness, Imbalance   Education / Equipment: None  Plan: Patient agrees to discharge.  Patient goals were not met. Patient is being discharged due to the patient's request.  ?????        B , PT, DPT 03/15/2021, 12:40 PM  [name] [address] [place], [place], [address] Phone: [phone]   Fax:  [phone] 

## 2021-03-27 DIAGNOSIS — F411 Generalized anxiety disorder: Secondary | ICD-10-CM | POA: Diagnosis not present

## 2021-03-27 DIAGNOSIS — R030 Elevated blood-pressure reading, without diagnosis of hypertension: Secondary | ICD-10-CM | POA: Diagnosis not present

## 2021-03-27 DIAGNOSIS — Z809 Family history of malignant neoplasm, unspecified: Secondary | ICD-10-CM | POA: Diagnosis not present

## 2021-03-27 DIAGNOSIS — Z6827 Body mass index (BMI) 27.0-27.9, adult: Secondary | ICD-10-CM | POA: Diagnosis not present

## 2021-03-27 DIAGNOSIS — Z79899 Other long term (current) drug therapy: Secondary | ICD-10-CM | POA: Diagnosis not present

## 2021-03-27 DIAGNOSIS — E785 Hyperlipidemia, unspecified: Secondary | ICD-10-CM | POA: Diagnosis not present

## 2021-03-27 DIAGNOSIS — E663 Overweight: Secondary | ICD-10-CM | POA: Diagnosis not present

## 2021-03-27 DIAGNOSIS — J309 Allergic rhinitis, unspecified: Secondary | ICD-10-CM | POA: Diagnosis not present

## 2021-03-27 DIAGNOSIS — R69 Illness, unspecified: Secondary | ICD-10-CM | POA: Diagnosis not present

## 2021-03-27 DIAGNOSIS — E039 Hypothyroidism, unspecified: Secondary | ICD-10-CM | POA: Diagnosis not present

## 2021-04-10 ENCOUNTER — Telehealth: Payer: Self-pay | Admitting: Family Medicine

## 2021-04-10 NOTE — Telephone Encounter (Signed)
Left message for patient to call back and schedule Medicare Annual Wellness Visit (AWV) either virtually OR in office.   Last AWV 01/11/20; please schedule at anytime with [facility]Nurse Health Advisor at [facility].  This should be a 45 minute visit.  Offered 04/30/21 at 230pm right after pt sees PCP for physical.

## 2021-04-19 DIAGNOSIS — Z8616 Personal history of COVID-19: Secondary | ICD-10-CM | POA: Diagnosis not present

## 2021-04-19 DIAGNOSIS — J3489 Other specified disorders of nose and nasal sinuses: Secondary | ICD-10-CM | POA: Diagnosis not present

## 2021-04-19 DIAGNOSIS — J343 Hypertrophy of nasal turbinates: Secondary | ICD-10-CM | POA: Diagnosis not present

## 2021-04-19 DIAGNOSIS — J349 Unspecified disorder of nose and nasal sinuses: Secondary | ICD-10-CM | POA: Diagnosis not present

## 2021-04-30 ENCOUNTER — Encounter: Payer: Self-pay | Admitting: Family Medicine

## 2021-04-30 ENCOUNTER — Ambulatory Visit (INDEPENDENT_AMBULATORY_CARE_PROVIDER_SITE_OTHER): Payer: Medicare HMO

## 2021-04-30 ENCOUNTER — Ambulatory Visit (INDEPENDENT_AMBULATORY_CARE_PROVIDER_SITE_OTHER): Payer: Medicare HMO | Admitting: Family Medicine

## 2021-04-30 ENCOUNTER — Other Ambulatory Visit: Payer: Self-pay

## 2021-04-30 VITALS — Wt 153.2 lb

## 2021-04-30 VITALS — BP 142/84 | HR 72 | Temp 98.2°F | Resp 16 | Ht 63.0 in | Wt 153.2 lb

## 2021-04-30 DIAGNOSIS — M810 Age-related osteoporosis without current pathological fracture: Secondary | ICD-10-CM

## 2021-04-30 DIAGNOSIS — G8929 Other chronic pain: Secondary | ICD-10-CM | POA: Diagnosis not present

## 2021-04-30 DIAGNOSIS — Z0000 Encounter for general adult medical examination without abnormal findings: Secondary | ICD-10-CM

## 2021-04-30 DIAGNOSIS — E039 Hypothyroidism, unspecified: Secondary | ICD-10-CM

## 2021-04-30 DIAGNOSIS — G250 Essential tremor: Secondary | ICD-10-CM | POA: Insufficient documentation

## 2021-04-30 DIAGNOSIS — F339 Major depressive disorder, recurrent, unspecified: Secondary | ICD-10-CM | POA: Diagnosis not present

## 2021-04-30 DIAGNOSIS — R519 Headache, unspecified: Secondary | ICD-10-CM

## 2021-04-30 DIAGNOSIS — R7301 Impaired fasting glucose: Secondary | ICD-10-CM | POA: Diagnosis not present

## 2021-04-30 DIAGNOSIS — F411 Generalized anxiety disorder: Secondary | ICD-10-CM

## 2021-04-30 DIAGNOSIS — R69 Illness, unspecified: Secondary | ICD-10-CM | POA: Diagnosis not present

## 2021-04-30 DIAGNOSIS — E782 Mixed hyperlipidemia: Secondary | ICD-10-CM

## 2021-04-30 LAB — COMPREHENSIVE METABOLIC PANEL
ALT: 13 U/L (ref 0–35)
AST: 18 U/L (ref 0–37)
Albumin: 4.2 g/dL (ref 3.5–5.2)
Alkaline Phosphatase: 82 U/L (ref 39–117)
BUN: 22 mg/dL (ref 6–23)
CO2: 29 mEq/L (ref 19–32)
Calcium: 9.5 mg/dL (ref 8.4–10.5)
Chloride: 104 mEq/L (ref 96–112)
Creatinine, Ser: 0.93 mg/dL (ref 0.40–1.20)
GFR: 61.68 mL/min (ref >60.00)
Glucose, Bld: 99 mg/dL (ref 70–99)
Potassium: 4.5 mEq/L (ref 3.5–5.1)
Sodium: 141 mEq/L (ref 135–145)
Total Bilirubin: 0.5 mg/dL (ref 0.2–1.2)
Total Protein: 6.8 g/dL (ref 6.0–8.3)

## 2021-04-30 LAB — CBC WITH DIFFERENTIAL/PLATELET
Basophils Absolute: 0 10*3/uL (ref 0.0–0.1)
Basophils Relative: 0.7 % (ref 0.0–3.0)
Eosinophils Absolute: 0.3 10*3/uL (ref 0.0–0.7)
Eosinophils Relative: 6 % — ABNORMAL HIGH (ref 0.0–5.0)
HCT: 36.9 % (ref 36.0–46.0)
Hemoglobin: 12.2 g/dL (ref 12.0–15.0)
Lymphocytes Relative: 35.7 % (ref 12.0–46.0)
Lymphs Abs: 2 10*3/uL (ref 0.7–4.0)
MCHC: 33.2 g/dL (ref 30.0–36.0)
MCV: 93.1 fl (ref 78.0–100.0)
Monocytes Absolute: 0.4 10*3/uL (ref 0.1–1.0)
Monocytes Relative: 7 % (ref 3.0–12.0)
Neutro Abs: 2.8 10*3/uL (ref 1.4–7.7)
Neutrophils Relative %: 50.6 % (ref 43.0–77.0)
Platelets: 279 10*3/uL (ref 150.0–400.0)
RBC: 3.97 Mil/uL (ref 3.87–5.11)
RDW: 13.2 % (ref 11.5–15.5)
WBC: 5.6 10*3/uL (ref 4.0–10.5)

## 2021-04-30 LAB — LIPID PANEL
Cholesterol: 195 mg/dL (ref 0–200)
HDL: 61.2 mg/dL
LDL Cholesterol: 116 mg/dL — ABNORMAL HIGH (ref 0–99)
NonHDL: 134.29
Total CHOL/HDL Ratio: 3
Triglycerides: 93 mg/dL (ref 0.0–149.0)
VLDL: 18.6 mg/dL (ref 0.0–40.0)

## 2021-04-30 LAB — HEMOGLOBIN A1C: Hgb A1c MFr Bld: 5.9 % (ref 4.6–6.5)

## 2021-04-30 LAB — TSH: TSH: 0.47 u[IU]/mL (ref 0.35–4.50)

## 2021-04-30 NOTE — Progress Notes (Addendum)
Subjective:   Lyndi Holbein is a 70 y.o. female who presents for Medicare Annual (Subsequent) preventive examination.  Review of Systems     Cardiac Risk Factors include: advanced age (>29men, >51 women);dyslipidemia     Objective:    Today's Vitals   04/30/21 1433  Weight: 153 lb 3.5 oz (69.5 kg)   Body mass index is 27.14 kg/m.  Advanced Directives 04/30/2021 01/04/2021 01/11/2020 10/02/2017 03/16/2015 11/30/2012 11/20/2012  Does Patient Have a Medical Advance Directive? Yes Yes Yes No Yes Patient has advance directive, copy not in chart Patient has advance directive, copy not in chart  Type of Advance Directive - [place];Living will [place];Living will - [place];Living will - [place];Living will  Does patient want to make changes to medical advance directive? Yes (MAU/Ambulatory/Procedural Areas - Information given) Yes (MAU/Ambulatory/Procedural Areas - Information given) No - Patient declined - Yes - information given - -  Copy of [place] in Chart? - - No - copy requested - No - copy requested - Copy requested from family  Pre-existing out of facility DNR order (yellow form or pink MOST form) - - - - - No -    Current Medications (verified) Outpatient Encounter Medications as of 04/30/2021  Medication Sig  . atorvastatin (LIPITOR) 10 MG tablet TAKE 1 TABLET BY MOUTH EVERY DAY  . azelastine (ASTELIN) 0.1 % nasal spray Place 2 sprays into both nostrils 2 (two) times daily.  . diazepam (VALIUM) 5 MG tablet TAKE 1/2 TO 1 TABLET BY MOUTH EVERY 8 HOURS AS NEEDED FOR ANXIETY  . DULoxetine (CYMBALTA) 30 MG capsule Take 30 mg by mouth daily.  [name] levothyroxine (SYNTHROID) 50 MCG tablet Take 1 tablet (50 mcg total) by mouth daily before breakfast. Every Tuesday and Thursday  . levothyroxine (SYNTHROID) 75 MCG tablet TAKE 1 TABLET BY MOUTH EVERY DAY  . valACYclovir (VALTREX) 1000 MG  tablet Take 2 tablets (2,000 mg total) by mouth 2 (two) times daily. Once, as needed for cold sores  . [DISCONTINUED] amitriptyline (ELAVIL) 25 MG tablet Take 0.5 tablets (12.5 mg total) by mouth at bedtime. (Patient not taking: Reported on 04/30/2021)  . [DISCONTINUED] FLUoxetine (PROZAC) 20 MG tablet Take 0.5 tablets (10 mg total) by mouth daily for 7 days, THEN 1 tablet (20 mg total) daily.   No facility-administered encounter medications on file as of 04/30/2021.    Allergies (verified) Simvastatin and Sulfonamide derivatives   History: Past Medical History:  Diagnosis Date  . Allergy   . Anxiety and depression 02/22/2014  . Arthritis   . Depression   . Diastolic dysfunction    Negative Nuclear Stress Test 08/09/12.  [name] Elevated cholesterol   . 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 History of colonic polyps   . Hypothyroidism   . Mitral regurgitation   . OSA (obstructive sleep apnea)    Mild. No CPAP.  [name] Osteoarthritis 06/29/2007  . 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 Seasonal allergies   . Sleep apnea    mild not on CPAP  . Tinnitus    Past Surgical History:  Procedure Laterality Date  . COLONOSCOPY    . POLYPECTOMY     Colon  . REPLACEMENT TOTAL KNEE Right   . TOTAL KNEE ARTHROPLASTY Right 04/22/2007  . TOTAL KNEE ARTHROPLASTY  11/30/2012   Procedure: TOTAL KNEE ARTHROPLASTY;  Surgeon: Gearlean Alf, MD;  Location:  [facility];  Service: Orthopedics;  Laterality: Left;   Family History  Problem Relation Age of Onset  . Heart attack Father   . Hyperlipidemia Father   . Heart disease Father   . Diabetes Father   . Brain cancer Father   . Macular degeneration Mother   . Diabetes Brother   . Migraines Daughter   . Pulmonary embolism Daughter   . Diabetes Paternal Grandfather   . Arthritis Other   . Breast cancer Maternal Aunt   . Breast cancer Paternal Aunt   . Colon polyps Neg Hx   . Colon cancer Neg Hx   .  Esophageal cancer Neg Hx   . Rectal cancer Neg Hx   . Stomach cancer Neg Hx    Social History   Socioeconomic History  . Marital status: Widowed    Spouse name: Not on file  . Number of children: Not on file  . Years of education: Not on file  . Highest education level: Not on file  Occupational History    Comment: retired   Tobacco Use  . Smoking status: Never Smoker  . Smokeless tobacco: Never Used  Substance and Sexual Activity  . Alcohol use: Yes    Comment: rare  . Drug use: No  . 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 No major dietary restrictions  Other Topics Concern  . Not on file  Social History Narrative  . Not on file   Social Determinants of Health   Financial Resource Strain: Low Risk   . Difficulty of Paying Living Expenses: Not hard at all  Food Insecurity: No Food Insecurity  . Worried About Charity fundraiser in the Last Year: Never true  . Ran Out of Food in the Last Year: Never true  Transportation Needs: No Transportation Needs  . Lack of Transportation (Medical): No  . Lack of Transportation (Non-Medical): No  Physical Activity: Inactive  . Days of Exercise per Week: 0 days  . Minutes of Exercise per Session: 0 min  Stress: No Stress Concern Present  . Feeling of Stress : Not at all  Social Connections: Unknown  . Frequency of Communication with Friends and Family: More than three times a week  . Frequency of Social Gatherings with Friends and Family: Three times a week  . Attends Religious Services: Not on file  . Active Member of Clubs or Organizations: Yes  . Attends Archivist Meetings: 1 to 4 times per year  . Marital Status: Widowed    Tobacco Counseling Counseling given: Not Answered   Clinical Intake:  Pre-visit preparation completed: Yes  Pain : No/denies pain (other than sinus headache issues)     BMI - recorded: 27.14 Nutritional Status: BMI 25 -29  Overweight Nutritional Risks: None Diabetes: No  How often do you need to have someone help you when you read instructions, pamphlets, or other written materials from your doctor or pharmacy?: 1 - Never  Diabetic?No  Interpreter Needed?: No  Information entered by :: Charlott Rakes, LPN   Activities of Daily Living In your present state of health, do you have any difficulty performing the following activities: 04/30/2021 03/01/2021  Hearing? Y N  Comment tinnitus and mild loss -  Vision? N N  Difficulty concentrating or making decisions? N Y  Walking or climbing stairs? N N  Dressing or bathing? N N  Doing errands, shopping? N N  Preparing Food and eating ?  N -  Using the Toilet? N -  In the past six months, have you accidently leaked urine? N -  Do you have problems with loss of bowel control? N -  Managing your Medications? N -  Managing your Finances? N -  Housekeeping or managing your Housekeeping? N -  Some recent data might be hidden    Patient Care Team: Leamon Arnt, MD as PCP - General (Family Medicine) Ladene Artist, MD as Consulting Physician (Gastroenterology) Harriett Sine, MD as Consulting Physician (Dermatology) Dyke Maes, OD as Consulting Physician (Optometry)  Indicate any recent Medical Services you may have received from other than [facility] providers in the past year (date may be approximate).     Assessment:   This is a routine wellness examination for [facility].  Hearing/Vision screen  Hearing Screening   125Hz  250Hz  500Hz  1000Hz  2000Hz  3000Hz  4000Hz  6000Hz  8000Hz   Right ear:           Left ear:           Comments: Pt stated tinnitus and mild loss   Vision Screening Comments: Pt follows up with [place] [facility] care for annual eye exams   Dietary issues and exercise activities discussed: Current Exercise Habits: The patient does not participate in regular exercise at present  Goals Addressed            This Visit's Progress   . Patient  Stated       Get more exercise in      Depression Screen PHQ 2/9 Scores 04/30/2021 04/30/2021 03/01/2021 09/25/2020 09/06/2020 05/19/2020 01/11/2020  PHQ - 2 Score 1 2 2 2 6  0 0  PHQ- 9 Score - 3 8 9 17 3  -    Fall Risk Fall Risk  04/30/2021 04/30/2021 01/11/2020 11/12/2019 09/14/2018  Falls in the past year? 1 0 0 1 No  Number falls in past yr: 1 0 0 0 -  Injury with Fall? 1 0 0 0 -  Comment skinned face at [place] - - - -  Risk for fall due to : History of fall(s);Impaired balance/gait;Impaired vision - - - -  Follow up Falls prevention discussed - Falls evaluation completed;Falls prevention discussed;Education provided - -    FALL RISK PREVENTION PERTAINING TO THE HOME:  Any stairs in or around the home? Yes  If so, are there any without handrails? No  Home free of loose throw rugs in walkways, pet beds, electrical cords, etc? Yes  Adequate lighting in your home to reduce risk of falls? Yes   ASSISTIVE DEVICES UTILIZED TO PREVENT FALLS:  Life alert? No   Has a apple watch Use of a cane, walker or w/c? No  Grab bars in the bathroom? No  Shower chair or bench in shower? No  Elevated toilet seat or a handicapped toilet? No   TIMED UP AND GO:  Was the test performed? Yes .  Length of time to ambulate 10 feet: 10 sec.   Gait steady and fast without use of assistive device  Cognitive Function:     6CIT Screen 04/30/2021 01/11/2020  What Year? 0 points 0 points  What month? 0 points 0 points  What time? - 0 points  Count back from 20 0 points 0 points  Months in reverse 0 points 0 points  Repeat phrase 0 points 0 points  Total Score - 0    Immunizations Immunization History  Administered Date(s) Administered  . Fluad Quad(high Dose 65+) 09/03/2019, 09/17/2020  . Influenza  Split 11/29/2011, 10/23/2012  . Influenza Whole 09/30/2008, 11/10/2010  . Influenza, High Dose Seasonal PF 09/15/2017, 09/14/2018  . Influenza,inj,Quad PF,6+ Mos 10/01/2013, 09/13/2014, 10/03/2015, 10/30/2016   . PFIZER(Purple Top)SARS-COV-2 Vaccination 02/07/2020, 03/03/2020, 11/02/2020  . Pneumococcal Conjugate-13 10/22/2013  . Pneumococcal Polysaccharide-23 03/16/2015  . Td 12/30/2005, 01/19/2016  . Zoster 08/02/2011  . Zoster Recombinat (Shingrix) 01/03/2020, 05/04/2020    TDAP status: Up to date  Flu Vaccine status: Up to date  Pneumococcal vaccine status: Up to date  Covid-19 vaccine status: Completed vaccines  Qualifies for Shingles Vaccine? Yes   Zostavax completed Yes   Shingrix Completed?: Yes  Screening Tests Health Maintenance  Topic Date Due  . INFLUENZA VACCINE  07/30/2021  . DEXA SCAN  12/20/2021  . MAMMOGRAM  01/25/2022  . COLONOSCOPY (Pts 45-60yrs Insurance coverage will need to be confirmed)  10/16/2022  . TETANUS/TDAP  01/18/2026  . COVID-19 Vaccine  Completed  . Hepatitis C Screening  Completed  . PNA vac Low Risk Adult  Completed  . HPV VACCINES  Aged Out    Health Maintenance  There are no preventive care reminders to display for this patient.  Colorectal cancer screening: Type of screening: Colonoscopy. Completed 10/16/17. Repeat every 5 years  Mammogram status: Completed 01/25/21. Repeat every year  Bone Density status: Completed 12/21/19. Results reflect: Bone density results: OSTEOPOROSIS. Repeat every 2 years.  Additional Screening:  Hepatitis C Screening:  Completed 01/19/16  Vision Screening: Recommended annual ophthalmology exams for early detection of glaucoma and other disorders of the eye. Is the patient up to date with their annual eye exam?  Yes  Who is the provider or what is the name of the office in which the patient attends annual eye exams? [place]  If pt is not established with a provider, would they like to be referred to a provider to establish care? No .   Dental Screening: Recommended annual dental exams for proper oral hygiene  Community Resource Referral / Chronic Care Management: CRR required this visit?  No   CCM  required this visit?  No      Plan:     I have personally reviewed and noted the following in the patient's chart:   . Medical and social history . Use of alcohol, tobacco or illicit drugs  . Current medications and supplements including opioid prescriptions.  . Functional ability and status . Nutritional status . Physical activity . Advanced directives . List of other physicians . Hospitalizations, surgeries, and ER visits in previous 12 months . Vitals . Screenings to include cognitive, depression, and falls . Referrals and appointments  In addition, I have reviewed and discussed with patient certain preventive protocols, quality metrics, and best practice recommendations. A written personalized care plan for preventive services as well as general preventive health recommendations were provided to patient.     Willette Brace, LPN   09/01/8100   Nurse Notes: pt b/p rechecked at 140/70 pt stated she will keep a log to monitor.

## 2021-04-30 NOTE — Progress Notes (Signed)
Subjective  Chief Complaint  Patient presents with  . Annual Exam    Not fasting.    HPI: Morgan [name] is a 70 y.o. female who presents to [facility] [place] [facility] today for a Female Wellness Visit. She also has the concerns and/or needs as listed above in the chief complaint. These will be addressed in addition to the Health Maintenance Visit.   Wellness Visit: annual visit with health maintenance review and exam without Pap   HM: Screens are up-to-date.  Feeling better overall.  Immunizations are up-to-date. Chronic disease f/u and/or acute problem visit: (deemed necessary to be done in addition to the wellness visit):  Chronic depression: Was unable to tolerate Prozac.  Now back to Cymbalta 30 mg daily.  Overall feeling a bit better and this may be because her headaches are improved somewhat.  Still has low days.  Prozac caused jitteriness and increase in essential tremor.  Chronic headaches: Improving.  Reviewed MRI which showed atrophy due to vascular changes but no other abnormalities.  Her sinus CT did shows inflammation and blockages in the sinuses.  She has since seen ENT who is scheduling a sinus surgery to help this problem.  This could improve her headaches.  History of sleep apnea: Not using CPAP machine  Hypothyroidism and mixed dose dosing of levothyroxine: Reports energy levels have stabilized.  She is compliant with medications.  Mixed hyperlipidemia: On statin.  Takes regularly.  No adverse effects.  Osteoporosis, postmenopausal: On calcium and vitamin D.  She will be due for bone density early next year.  If worsening, we discussed starting treatment at that time.  Patient understands and agrees.  Essential tremor: Mild and not bothersome.  Runs in the family.  Assessment  1. Annual physical exam   2. Major depression, recurrent, chronic ([place])   3. IFG (impaired fasting glucose)   4. Chronic nonintractable headache, unspecified headache  type   5. Acquired hypothyroidism   6. Mixed hyperlipidemia   7. Osteoporosis, post-menopausal   8. GAD (generalized anxiety disorder)   9. Essential tremor      Plan  Female Wellness Visit:  Age appropriate Health Maintenance and Prevention measures were discussed with patient. Included topics are cancer screening recommendations, ways to keep healthy (see AVS) including dietary and exercise recommendations, regular eye and dental care, use of seat belts, and avoidance of moderate alcohol use and tobacco use.  Mammogram and bone density to be done in January 2023.  Colorectal cancer screens are up-to-date.  BMI: discussed patient's BMI and encouraged positive lifestyle modifications to help get to or maintain a target BMI.  HM needs and immunizations were addressed and ordered. See below for orders. See HM and immunization section for updates.  Up-to-date  Routine labs and screening tests ordered including cmp, cbc and lipids where appropriate.  Discussed recommendations regarding Vit D and calcium supplementation (see AVS)  Chronic disease management visit and/or acute problem visit:  Major depression: Mildly active: Continue Cymbalta 30 daily.  Will monitor mood after sinus surgery.  If mood does not improve with improvement of headaches, will increase Cymbalta to 30 mg twice daily and follow-up in the office.  Patient agrees with care plan.  Recheck lipids, nonfasting glucose, A1c, thyroid adjust medications as needed.  She will need refills of her statin and thyroid medications.  All chronic medical problems seem clinically well controlled including hypothyroidism, impaired fasting glucose and hyperlipidemia  Headaches: As noted above, could be coming from  sinuses.  Follow-up with ENT as scheduled.  We will hold off on headache referral at this time.  Essential tremor: Monitor   Follow up: 6 months to recheck mood Orders Placed This Encounter  Procedures  . CBC with  Differential/Platelet  . Comprehensive metabolic panel  . Lipid panel  . TSH  . Hemoglobin A1c   No orders of the defined types were placed in this encounter.     Body mass index is 27.14 kg/m. Wt Readings from Last 3 Encounters:  04/30/21 153 lb 3.5 oz (69.5 kg)  04/30/21 153 lb 3.2 oz (69.5 kg)  03/01/21 151 lb (68.5 kg)     Patient Active Problem List   Diagnosis Date Noted  . OSA (obstructive sleep apnea), not using CPAP 07/04/2010    Priority: High  . Mixed hyperlipidemia 07/14/2007    Priority: High  . Acquired hypothyroidism 06/29/2007    Priority: High  . Major depression, recurrent, chronic ([place]) 09/25/2020    Priority: Medium  . IFG (impaired fasting glucose) 05/19/2020    Priority: Medium  . Osteoporosis, post-menopausal 11/12/2019    Priority: Medium    Dexa 11/2019: T = - 2.6 right femur, worse than prior dexa 2017; recommend starting treatment.    . Colon polyps 11/12/2019    Priority: Medium  . GAD (generalized anxiety disorder) 02/22/2014    Priority: Medium  . Diastolic dysfunction 93/71/6967    Priority: Medium  . Osteoarthritis 06/29/2007    Priority: Medium  . 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 Mild mitral regurgitation by prior echocardiogram 07/24/2010    Priority: Low  . Allergic rhinitis 06/29/2007    Priority: Low  . Essential tremor 04/30/2021   Health Maintenance  Topic Date Due  . INFLUENZA VACCINE  07/30/2021  . DEXA SCAN  12/20/2021  . MAMMOGRAM  01/25/2022  . COLONOSCOPY (Pts 45-79yrs Insurance coverage will need to be confirmed)  10/16/2022  . TETANUS/TDAP  01/18/2026  . COVID-19 Vaccine  Completed  . Hepatitis C Screening  Completed  . PNA vac Low Risk Adult  Completed  . HPV VACCINES  Aged Out   Immunization History  Administered Date(s) Administered  . Fluad Quad(high Dose 65+) 09/03/2019, 09/17/2020  . Influenza Split 11/29/2011, 10/23/2012  .  Influenza Whole 09/30/2008, 11/10/2010  . Influenza, High Dose Seasonal PF 09/15/2017, 09/14/2018  . Influenza,inj,Quad PF,6+ Mos 10/01/2013, 09/13/2014, 10/03/2015, 10/30/2016  . PFIZER(Purple Top)SARS-COV-2 Vaccination 02/07/2020, 03/03/2020, 11/02/2020  . Pneumococcal Conjugate-13 10/22/2013  . Pneumococcal Polysaccharide-23 03/16/2015  . Td 12/30/2005, 01/19/2016  . Zoster 08/02/2011  . Zoster Recombinat (Shingrix) 01/03/2020, 05/04/2020   We updated and reviewed the patient's past history in detail and it is documented below. Allergies: Patient is allergic to simvastatin and sulfonamide derivatives. Past Medical History Patient  has a past medical history of Allergy, Anxiety and depression (02/22/2014), Arthritis, Depression, Diastolic dysfunction, Elevated cholesterol, Grover's disease (11/12/2019), History of colonic polyps, Hypothyroidism, Mitral regurgitation, OSA (obstructive sleep apnea), Osteoarthritis (06/29/2007), Osteoporosis, post-menopausal (11/12/2019), Recurrent cold sores (11/12/2019), Seasonal allergies, Sleep apnea, and Tinnitus. 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 Family History: Patient family history includes Arthritis in an other family member; Brain cancer in her father; Breast cancer in her maternal aunt and paternal aunt; Diabetes in her brother, father, and paternal grandfather; Heart attack  in her father; Heart disease in her father; Hyperlipidemia in her father; Macular degeneration in her mother; Migraines in her daughter; Pulmonary embolism in her daughter. Social History:  Patient  reports that she has never smoked. She has never used smokeless tobacco. She reports current alcohol use. She reports that she does not use drugs.  Review of Systems: Constitutional: negative for fever or  malaise Ophthalmic: negative for photophobia, double vision or loss of vision Cardiovascular: negative for chest pain, dyspnea on exertion, or new LE swelling Respiratory: negative for SOB or persistent cough Gastrointestinal: negative for abdominal pain, change in bowel habits or melena Genitourinary: negative for dysuria or gross hematuria, no abnormal uterine bleeding or disharge Musculoskeletal: negative for new gait disturbance or muscular weakness Integumentary: negative for new or persistent rashes, no breast lumps Neurological: negative for TIA or stroke symptoms Psychiatric: negative for SI or delusions Allergic/Immunologic: negative for hives  Patient Care Team    Relationship Specialty Notifications Start End  Leamon Arnt, MD PCP - General Family Medicine  11/12/19   Ladene Artist, MD Consulting Physician Gastroenterology  03/15/15   Harriett Sine, MD Consulting Physician Dermatology  01/11/20   Dyke Maes, OD Consulting Physician Optometry  01/11/20     Objective  Vitals: BP (!) 142/84   Pulse 72   Temp 98.2 F (36.8 C) (Temporal)   Resp 16   Ht 5\' 3"  (1.6 m)   Wt 153 lb 3.2 oz (69.5 kg)   SpO2 98%   BMI 27.14 kg/m  General:  Well developed, well nourished, no acute distress  Psych:  Alert and orientedx3,normal mood and affect HEENT:  Normocephalic, atraumatic, non-icteric sclera,  supple neck without adenopathy, mass or thyromegaly Cardiovascular:  Normal S1, S2, RRR without gallop, rub or murmur Respiratory:  Good breath sounds bilaterally, CTAB with normal respiratory effort Gastrointestinal: normal bowel sounds, soft, non-tender, no noted masses. No HSM MSK: no deformities, contusions. Joints are without erythema or swelling.  Skin:  Warm, no rashes or suspicious lesions noted Neurologic:    Mental status is normal. CN 2-11 are normal. Gross motor and sensory exams are normal. Normal gait. Fine tremor present Breast Exam: No mass, skin retraction  or nipple discharge is appreciated in either breast. No axillary adenopathy. Fibrocystic changes are not noted    Commons side effects, risks, benefits, and alternatives for medications and treatment plan prescribed today were discussed, and the patient expressed understanding of the given instructions. Patient is instructed to call or message via MyChart if he/she has any questions or concerns regarding our treatment plan. No barriers to understanding were identified. We discussed Red Flag symptoms and signs in detail. Patient expressed understanding regarding what to do in case of urgent or emergency type symptoms.   Medication list was reconciled, printed and provided to the patient in AVS. Patient instructions and summary information was reviewed with the patient as documented in the AVS. This note was prepared with assistance of Dragon voice recognition software. Occasional wrong-word or sound-a-like substitutions may have occurred due to the inherent limitations of voice recognition software  This visit occurred during the SARS-CoV-2 public health emergency.  Safety protocols were in place, including screening questions prior to the visit, additional usage of staff PPE, and extensive cleaning of exam room while observing appropriate contact time as indicated for disinfecting solutions.

## 2021-04-30 NOTE — Patient Instructions (Addendum)
Please return in 6 months to recheck mood  If your mood remains low after headaches have improved, please increase your cymbalta to 30mg  twice a day. (let me know if you need a refill).  I will refill your cholesterol and thyroid medications after the lab results return today.   If you have any questions or concerns, please don't hesitate to send me a message via MyChart or call the office at [phone]. Thank you for visiting with [place] today! It's our pleasure caring for you.  Your mammogram can be done with your dexa scan in January of 2023.

## 2021-04-30 NOTE — Patient Instructions (Addendum)
Morgan [name] , Thank you for taking time to come for your Medicare Wellness Visit. I appreciate your ongoing commitment to your health goals. Please review the following plan we discussed and let me know if I can assist you in the future.   Screening recommendations/referrals: Colonoscopy: Done 10/16/17 Mammogram: Done 01/25/21 Bone Density: Done 12/21/19 Recommended yearly ophthalmology/optometry visit for glaucoma screening and checkup Recommended yearly dental visit for hygiene and checkup  Vaccinations: Influenza vaccine: Up to date Pneumococcal vaccine: Up to date Tdap vaccine: Up to date Shingles vaccine: Completed 1/4 & 05/04/20   Covid-19:Completed 2/8/, 3/5, 11/02/20  Advanced directives: Please bring a copy of your health care power of attorney and living will to the office at your convenience.  Conditions/risks identified: Continue to get back to exercise   Next appointment: Follow up in one year for your annual wellness visit    Preventive Care 65 Years and Older, Female Preventive care refers to lifestyle choices and visits with your health care provider that can promote health and wellness. What does preventive care include?  A yearly physical exam. This is also called an annual well check.  Dental exams once or twice a year.  Routine eye exams. Ask your health care provider how often you should have your eyes checked.  Personal lifestyle choices, including:  Daily care of your teeth and gums.  Regular physical activity.  Eating a healthy diet.  Avoiding tobacco and drug use.  Limiting alcohol use.  Practicing safe sex.  Taking low-dose aspirin every day.  Taking vitamin and mineral supplements as recommended by your health care provider. What happens during an annual well check? The services and screenings done by your health care provider during your annual well check will depend on your age, overall health, lifestyle risk factors, and family history of  disease. Counseling  Your health care provider may ask you questions about your:  Alcohol use.  Tobacco use.  Drug use.  Emotional well-being.  Home and relationship well-being.  Sexual activity.  Eating habits.  History of falls.  Memory and ability to understand (cognition).  Work and work Statistician.  Reproductive health. Screening  You may have the following tests or measurements:  Height, weight, and BMI.  Blood pressure.  Lipid and cholesterol levels. These may be checked every 5 years, or more frequently if you are over 46 years old.  Skin check.  Lung cancer screening. You may have this screening every year starting at age 25 if you have a 30-pack-year history of smoking and currently smoke or have quit within the past 15 years.  Fecal occult blood test (FOBT) of the stool. You may have this test every year starting at age 42.  Flexible sigmoidoscopy or colonoscopy. You may have a sigmoidoscopy every 5 years or a colonoscopy every 10 years starting at age 69.  Hepatitis C blood test.  Hepatitis B blood test.  Sexually transmitted disease (STD) testing.  Diabetes screening. This is done by checking your blood sugar (glucose) after you have not eaten for a while (fasting). You may have this done every 1-3 years.  Bone density scan. This is done to screen for osteoporosis. You may have this done starting at age 40.  Mammogram. This may be done every 1-2 years. Talk to your health care provider about how often you should have regular mammograms. Talk with your health care provider about your test results, treatment options, and if necessary, the need for more tests. Vaccines  Your health  care provider may recommend certain vaccines, such as:  Influenza vaccine. This is recommended every year.  Tetanus, diphtheria, and acellular pertussis (Tdap, Td) vaccine. You may need a Td booster every 10 years.  Zoster vaccine. You may need this after age  81.  Pneumococcal 13-valent conjugate (PCV13) vaccine. One dose is recommended after age 1.  Pneumococcal polysaccharide (PPSV23) vaccine. One dose is recommended after age 45. Talk to your health care provider about which screenings and vaccines you need and how often you need them. This information is not intended to replace advice given to you by your health care provider. Make sure you discuss any questions you have with your health care provider. Document Released: 01/12/2016 Document Revised: 09/04/2016 Document Reviewed: 10/17/2015 Elsevier Interactive Patient Education  2017 [place] Prevention in the Home Falls can cause injuries. They can happen to people of all ages. There are many things you can do to make your home safe and to help prevent falls. What can I do on the outside of my home?  Regularly fix the edges of walkways and driveways and fix any cracks.  Remove anything that might make you trip as you walk through a door, such as a raised step or threshold.  Trim any bushes or trees on the path to your home.  Use bright outdoor lighting.  Clear any walking paths of anything that might make someone trip, such as rocks or tools.  Regularly check to see if handrails are loose or broken. Make sure that both sides of any steps have handrails.  Any raised decks and porches should have guardrails on the edges.  Have any leaves, snow, or ice cleared regularly.  Use sand or salt on walking paths during winter.  Clean up any spills in your garage right away. This includes oil or grease spills. What can I do in the bathroom?  Use night lights.  Install grab bars by the toilet and in the tub and shower. Do not use towel bars as grab bars.  Use non-skid mats or decals in the tub or shower.  If you need to sit down in the shower, use a plastic, non-slip stool.  Keep the floor dry. Clean up any water that spills on the floor as soon as it happens.  Remove  soap buildup in the tub or shower regularly.  Attach bath mats securely with double-sided non-slip rug tape.  Do not have throw rugs and other things on the floor that can make you trip. What can I do in the bedroom?  Use night lights.  Make sure that you have a light by your bed that is easy to reach.  Do not use any sheets or blankets that are too big for your bed. They should not hang down onto the floor.  Have a firm chair that has side arms. You can use this for support while you get dressed.  Do not have throw rugs and other things on the floor that can make you trip. What can I do in the kitchen?  Clean up any spills right away.  Avoid walking on wet floors.  Keep items that you use a lot in easy-to-reach places.  If you need to reach something above you, use a strong step stool that has a grab bar.  Keep electrical cords out of the way.  Do not use floor polish or wax that makes floors slippery. If you must use wax, use non-skid floor wax.  Do not have  throw rugs and other things on the floor that can make you trip. What can I do with my stairs?  Do not leave any items on the stairs.  Make sure that there are handrails on both sides of the stairs and use them. Fix handrails that are broken or loose. Make sure that handrails are as long as the stairways.  Check any carpeting to make sure that it is firmly attached to the stairs. Fix any carpet that is loose or worn.  Avoid having throw rugs at the top or bottom of the stairs. If you do have throw rugs, attach them to the floor with carpet tape.  Make sure that you have a light switch at the top of the stairs and the bottom of the stairs. If you do not have them, ask someone to add them for you. What else can I do to help prevent falls?  Wear shoes that:  Do not have high heels.  Have rubber bottoms.  Are comfortable and fit you well.  Are closed at the toe. Do not wear sandals.  If you use a  stepladder:  Make sure that it is fully opened. Do not climb a closed stepladder.  Make sure that both sides of the stepladder are locked into place.  Ask someone to hold it for you, if possible.  Clearly mark and make sure that you can see:  Any grab bars or handrails.  First and last steps.  Where the edge of each step is.  Use tools that help you move around (mobility aids) if they are needed. These include:  Canes.  Walkers.  Scooters.  Crutches.  Turn on the lights when you go into a dark area. Replace any light bulbs as soon as they burn out.  Set up your furniture so you have a clear path. Avoid moving your furniture around.  If any of your floors are uneven, fix them.  If there are any pets around you, be aware of where they are.  Review your medicines with your doctor. Some medicines can make you feel dizzy. This can increase your chance of falling. Ask your doctor what other things that you can do to help prevent falls. This information is not intended to replace advice given to you by your health care provider. Make sure you discuss any questions you have with your health care provider. Document Released: 10/12/2009 Document Revised: 05/23/2016 Document Reviewed: 01/20/2015 Elsevier Interactive Patient Education  2017 [employer].

## 2021-05-11 ENCOUNTER — Other Ambulatory Visit: Payer: Self-pay | Admitting: Otolaryngology

## 2021-05-11 DIAGNOSIS — J330 Polyp of nasal cavity: Secondary | ICD-10-CM | POA: Diagnosis not present

## 2021-05-11 DIAGNOSIS — J321 Chronic frontal sinusitis: Secondary | ICD-10-CM | POA: Diagnosis not present

## 2021-05-11 DIAGNOSIS — J322 Chronic ethmoidal sinusitis: Secondary | ICD-10-CM | POA: Diagnosis not present

## 2021-05-11 DIAGNOSIS — J329 Chronic sinusitis, unspecified: Secondary | ICD-10-CM | POA: Diagnosis not present

## 2021-05-11 DIAGNOSIS — J338 Other polyp of sinus: Secondary | ICD-10-CM | POA: Diagnosis not present

## 2021-05-11 DIAGNOSIS — J320 Chronic maxillary sinusitis: Secondary | ICD-10-CM | POA: Diagnosis not present

## 2021-05-24 IMAGING — MG MM DIGITAL SCREENING BILAT W/ TOMO AND CAD
8 series · 9 of 24 positions shown · non-contrast
Comparison: Previous exam(s).

CLINICAL DATA: Screening.

EXAM:
DIGITAL SCREENING BILATERAL MAMMOGRAM WITH TOMO AND CAD

[R CC synth-2D]
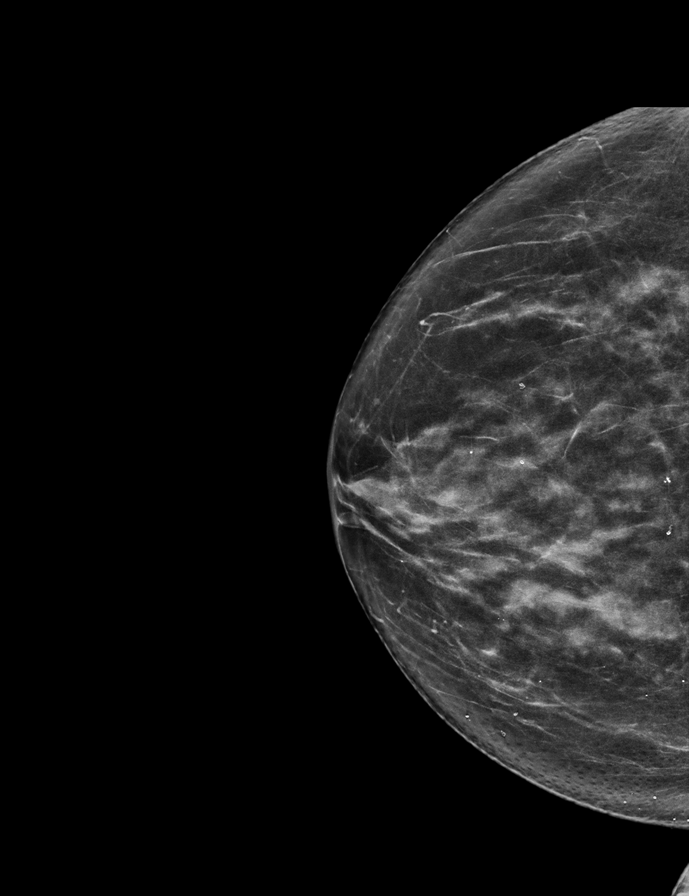

[R MLO synth-2D]
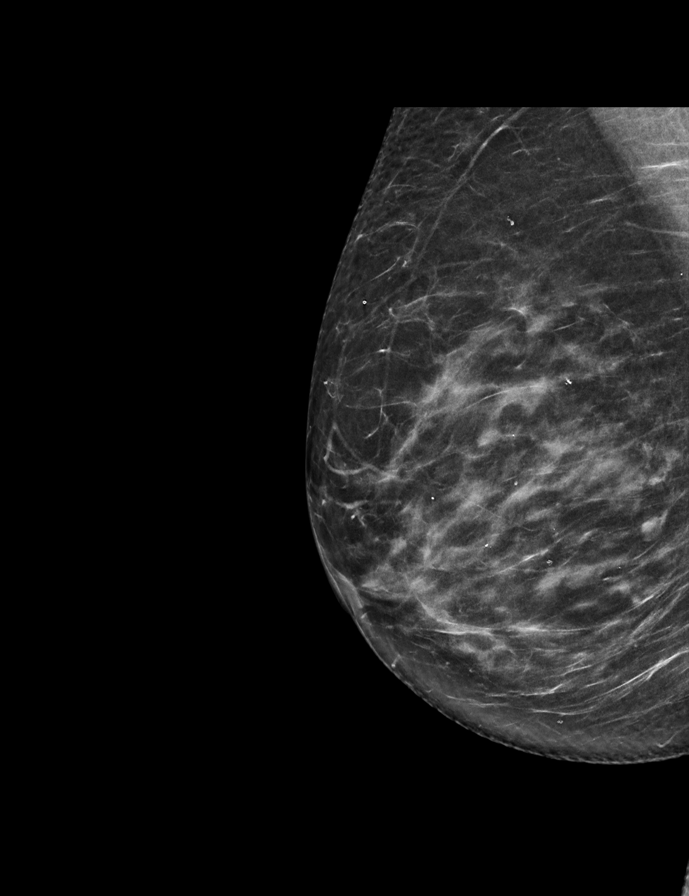

[L MLO synth-2D]
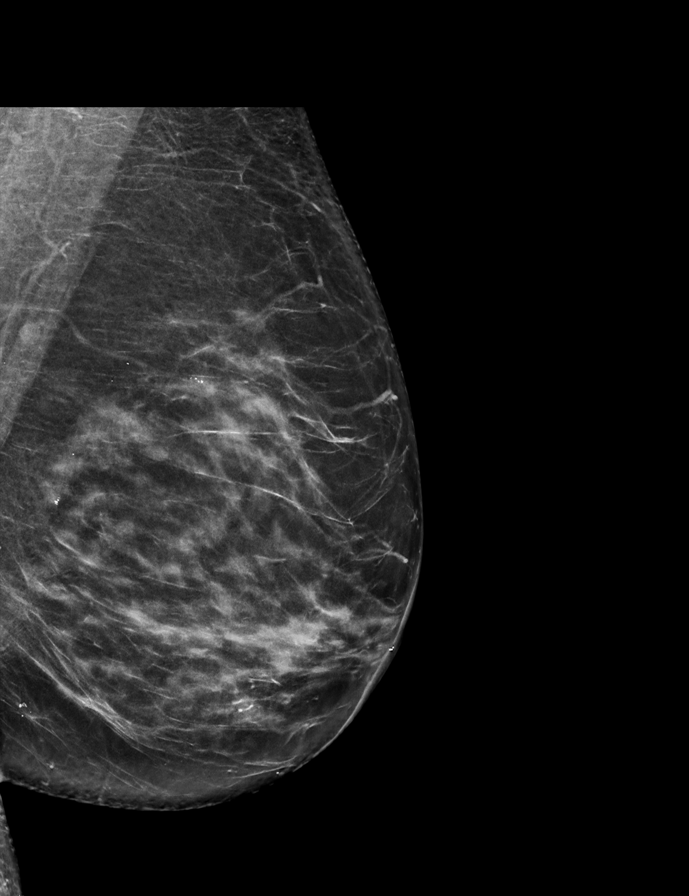

[L CC synth-2D]
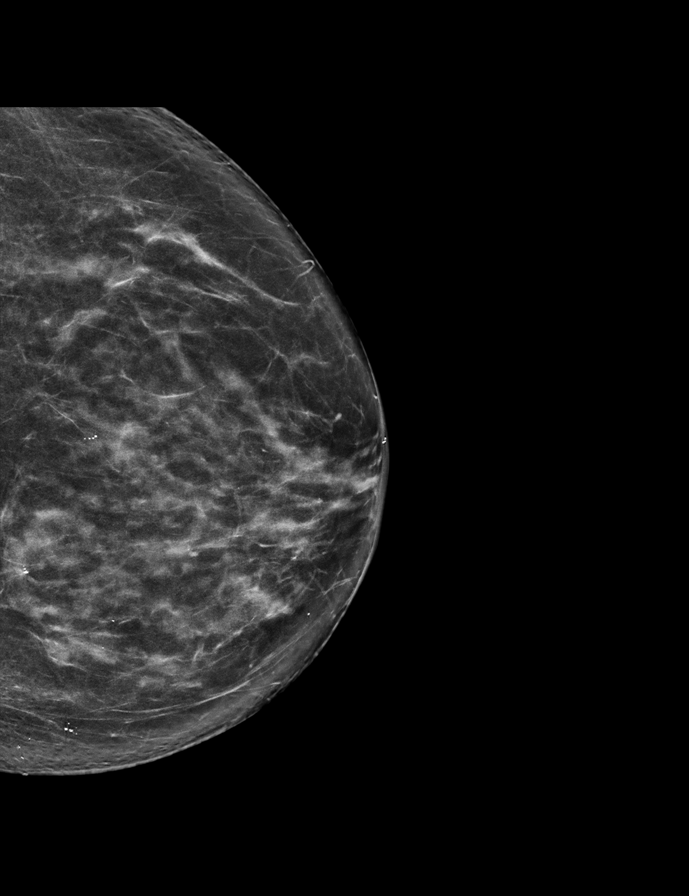

[R CC tomo · 2 of 66 frames shown]
[frame 22/66]
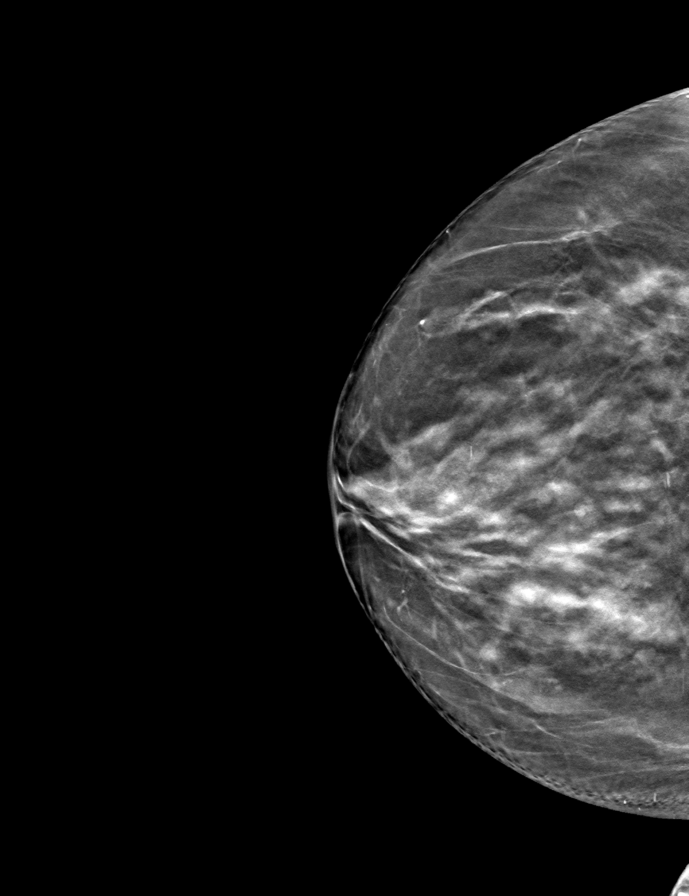
[frame 33/66]
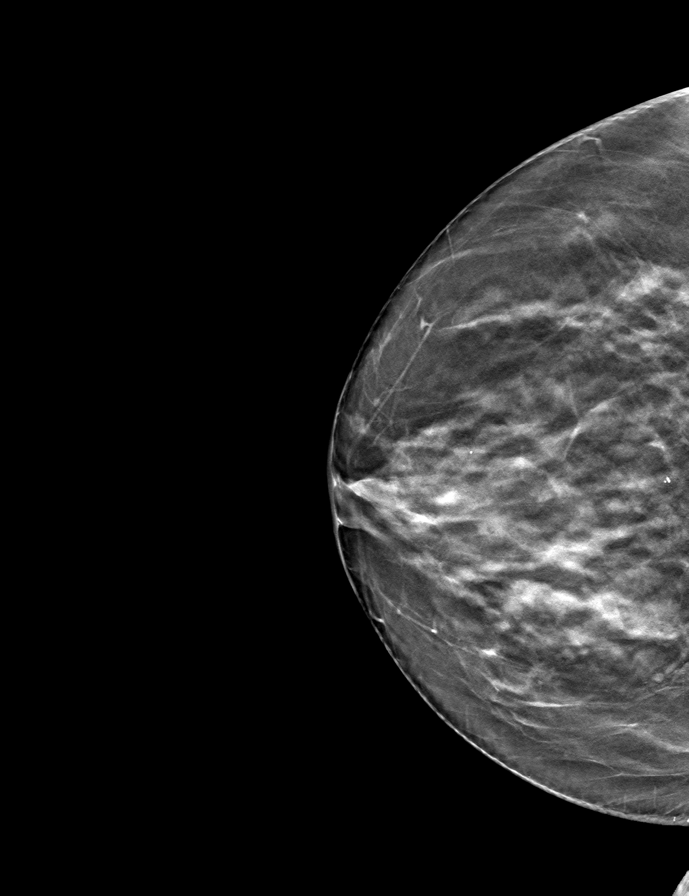

[L CC tomo · tomo slice 35/69.0]
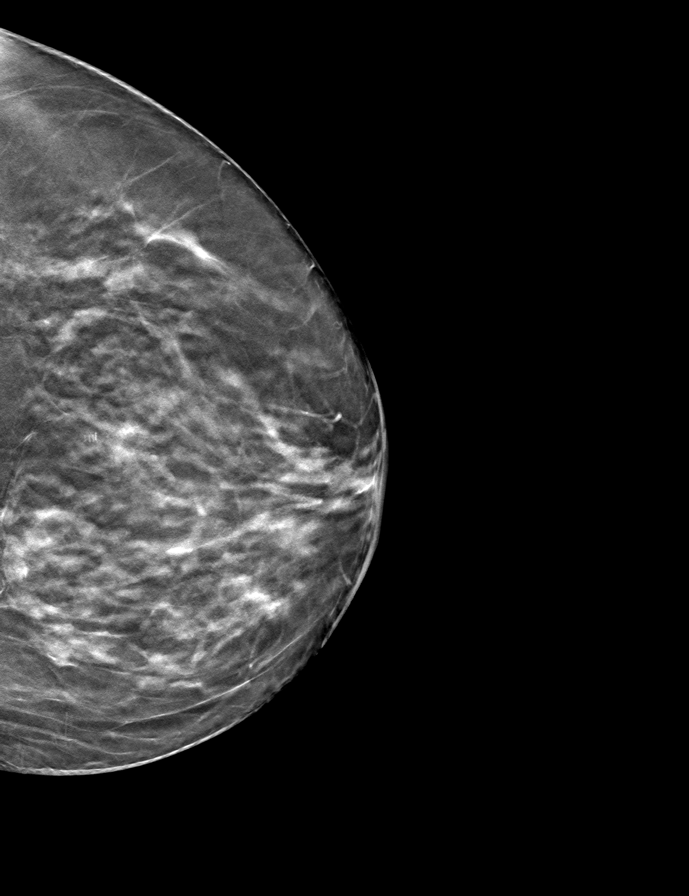

[L MLO tomo · tomo slice 37/74.0]
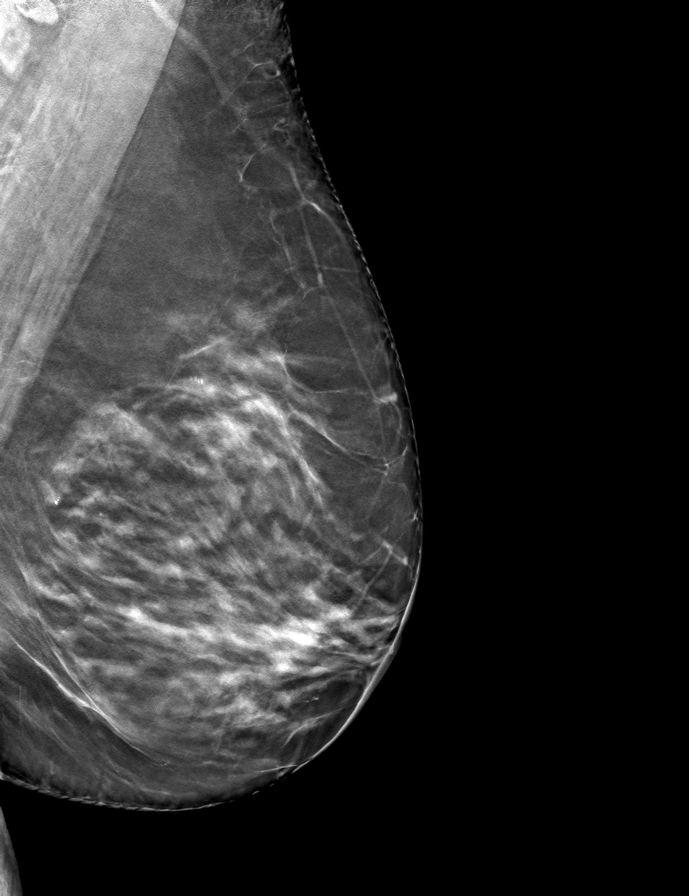

[R MLO tomo · tomo slice 35/69.0]
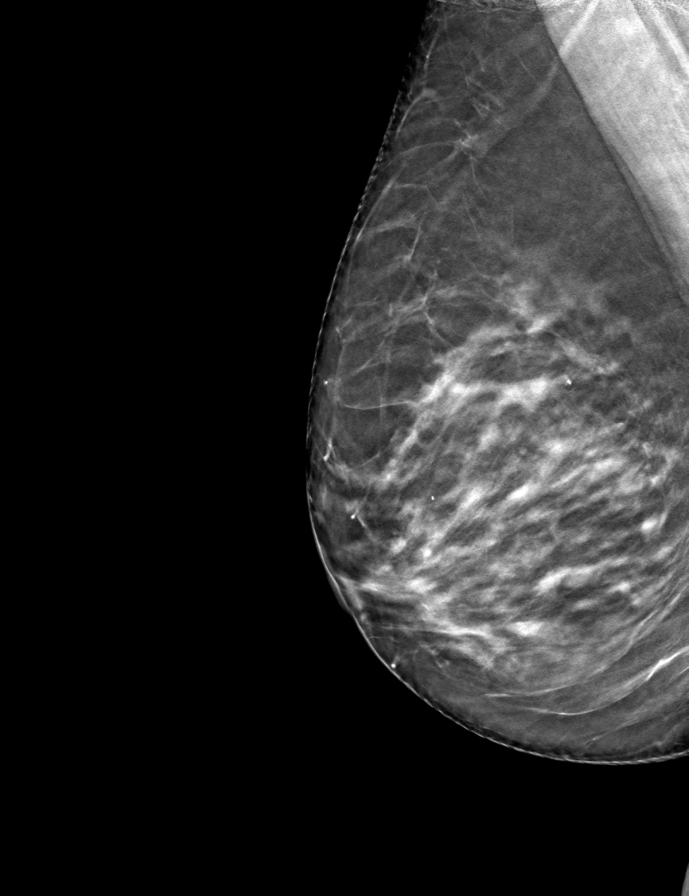

[9 of 24 positions shown; findings below may reference images not displayed]

ACR Breast Density Category c: The breast tissue is heterogeneously
dense, which may obscure small masses.
FINDINGS: There are no findings suspicious for malignancy. The images were
evaluated with computer-aided detection.
IMPRESSION: No mammographic evidence of malignancy. A result letter of this
screening mammogram will be mailed directly to the patient.

RECOMMENDATION:
Screening mammogram in one year. (Code:[id number])

BI-RADS CATEGORY  1: Negative.

## 2021-05-29 DIAGNOSIS — Z48813 Encounter for surgical aftercare following surgery on the respiratory system: Secondary | ICD-10-CM | POA: Diagnosis not present

## 2021-05-29 DIAGNOSIS — Z9889 Other specified postprocedural states: Secondary | ICD-10-CM | POA: Diagnosis not present

## 2021-05-29 DIAGNOSIS — J343 Hypertrophy of nasal turbinates: Secondary | ICD-10-CM | POA: Diagnosis not present

## 2021-06-14 DIAGNOSIS — Z9889 Other specified postprocedural states: Secondary | ICD-10-CM | POA: Diagnosis not present

## 2021-06-14 DIAGNOSIS — Z48813 Encounter for surgical aftercare following surgery on the respiratory system: Secondary | ICD-10-CM | POA: Diagnosis not present

## 2021-06-14 DIAGNOSIS — J3489 Other specified disorders of nose and nasal sinuses: Secondary | ICD-10-CM | POA: Diagnosis not present

## 2021-06-16 ENCOUNTER — Other Ambulatory Visit: Payer: Self-pay | Admitting: Family Medicine

## 2021-07-08 ENCOUNTER — Other Ambulatory Visit: Payer: Self-pay | Admitting: Family Medicine

## 2021-07-08 DIAGNOSIS — E782 Mixed hyperlipidemia: Secondary | ICD-10-CM

## 2021-07-14 ENCOUNTER — Other Ambulatory Visit: Payer: Self-pay | Admitting: Family Medicine

## 2021-08-06 ENCOUNTER — Other Ambulatory Visit: Payer: Self-pay

## 2021-08-06 ENCOUNTER — Encounter: Payer: Self-pay | Admitting: Family Medicine

## 2021-08-06 ENCOUNTER — Ambulatory Visit (INDEPENDENT_AMBULATORY_CARE_PROVIDER_SITE_OTHER): Payer: Medicare HMO | Admitting: Family Medicine

## 2021-08-06 VITALS — BP 124/64 | HR 68 | Temp 97.2°F | Wt 150.4 lb

## 2021-08-06 DIAGNOSIS — R519 Headache, unspecified: Secondary | ICD-10-CM | POA: Diagnosis not present

## 2021-08-06 DIAGNOSIS — G8929 Other chronic pain: Secondary | ICD-10-CM | POA: Diagnosis not present

## 2021-08-06 DIAGNOSIS — G4733 Obstructive sleep apnea (adult) (pediatric): Secondary | ICD-10-CM

## 2021-08-06 DIAGNOSIS — F339 Major depressive disorder, recurrent, unspecified: Secondary | ICD-10-CM

## 2021-08-06 DIAGNOSIS — R69 Illness, unspecified: Secondary | ICD-10-CM | POA: Diagnosis not present

## 2021-08-06 DIAGNOSIS — R0683 Snoring: Secondary | ICD-10-CM | POA: Diagnosis not present

## 2021-08-06 DIAGNOSIS — E782 Mixed hyperlipidemia: Secondary | ICD-10-CM

## 2021-08-06 DIAGNOSIS — R400 Somnolence: Secondary | ICD-10-CM

## 2021-08-06 NOTE — Progress Notes (Signed)
Subjective  CC:  Chief Complaint  Patient presents with   Sleep Apnea    Wanting to get a sleep study. Sleeps 8-10 hours a night, but is always tired. Recently went on trip with friend who states she is snoring and gasping for air    HPI: Morgan [name] is a 70 y.o. female who presents to the office today to address the problems listed above in the chief complaint. 70 year old female presents due to concerns about sleep apnea.  Of note, she reports she had a sleep apnea study about 15 years ago.  She was diagnosed with mild sleep apnea and she was offered a CPAP however it never felt right she never used it.  She is done well until recently when a friend of her noticed significant snoring and apneic episodes.  Patient admits that she rarely feels refreshed in the mornings.  She is often tired throughout the day.  She sleeps alone so was not aware of her snoring and she denies having apneic episodes that she is aware of. Depression anxiety: She is on Cymbalta 30 mg daily and feels her mood is doing well overall.  We are depressive symptoms now.  Mild anxiety symptoms.  Has improved significantly since her headaches are better.  See next Headaches: She has had right sinus surgery and fortunately, her headaches have completely resolved.  See previous notes. Hyperlipidemia: She is now tolerating Lipitor 10 mg nightly.  No myalgias.  Her LDL is just above goal and this was when she was taking it 3-4 times weekly.  Assessment  1. OSA (obstructive sleep apnea)   2. Snoring   3. Daytime somnolence   4. Chronic nonintractable headache, unspecified headache type   5. Major depression, recurrent, chronic ([place])   6. Mixed hyperlipidemia      Plan  Obstructive sleep apnea with symptoms: Refer for formal sleep study and evaluation.  Will likely need to start CPAP.  Education given. Headaches: Resolved.  Sinus related.  Did well with sinus surgery. Depression anxiety: Stable on Cymbalta 30 mg  daily.  Continue to monitor. Hyperlipidemia: We increased dose of Lipitor to daily dosing.  Tolerating well.  We will recheck lipid panel and LFTs in 3 months   Follow up: 3 months to recheck IFG and hyperlipidemia Visit date not found  Orders Placed This Encounter  Procedures   Ambulatory referral to Pulmonology   No orders of the defined types were placed in this encounter.     I reviewed the patients updated PMH, FH, and SocHx.    Patient Active Problem List   Diagnosis Date Noted   OSA (obstructive sleep apnea), not using CPAP 07/04/2010    Priority: High   Mixed hyperlipidemia 07/14/2007    Priority: High   Acquired hypothyroidism 06/29/2007    Priority: High   Major depression, recurrent, chronic ([place]) 09/25/2020    Priority: Medium   IFG (impaired fasting glucose) 05/19/2020    Priority: Medium   Osteoporosis, post-menopausal 11/12/2019    Priority: Medium   Colon polyps 11/12/2019    Priority: Medium   GAD (generalized anxiety disorder) 02/22/2014    Priority: Medium   Diastolic dysfunction [id number]    Priority: Medium   Osteoarthritis 06/29/2007    Priority: Medium   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   diazepam (VALIUM) 5 MG tablet TAKE 1/2 TO 1 TABLET BY MOUTH EVERY 8 HOURS AS NEEDED FOR ANXIETY   DULoxetine (CYMBALTA) 30 MG capsule TAKE ONE CAPSULE BY MOUTH ONE TIME DAILY   levothyroxine (SYNTHROID) 50 MCG tablet TAKE 1 TABLET (50 MCG TOTAL) BY MOUTH DAILY BEFORE BREAKFAST. EVERY TUESDAY AND THURSDAY   levothyroxine (SYNTHROID) 75 MCG  tablet TAKE 1 TABLET BY MOUTH EVERY DAY   valACYclovir (VALTREX) 1000 MG tablet Take 2 tablets (2,000 mg total) by mouth 2 (two) times daily. Once, as needed for cold sores    Allergies: Patient is allergic to simvastatin and sulfonamide derivatives. Family History: Patient family history includes Arthritis in an other family member; Brain cancer in her father; Breast cancer in her maternal aunt and paternal aunt; Diabetes in her brother, father, and paternal grandfather; Heart attack in her father; Heart disease in her father; Hyperlipidemia in her father; Macular degeneration in her mother; Migraines in her daughter; Pulmonary embolism in her daughter. Social History:  Patient  reports that she has never smoked. She has never used smokeless tobacco. She reports current alcohol use. She reports that she does not use drugs.  Review of Systems: Constitutional: Negative for fever malaise or anorexia Cardiovascular: negative for chest pain Respiratory: negative for SOB or persistent cough Gastrointestinal: negative for abdominal pain  Objective  Vitals: BP 124/64   Pulse 68   Temp (!) 97.2 F (36.2 C) (Temporal)   Wt 150 lb 6.4 oz (68.2 kg)   SpO2 98%   BMI 26.64 kg/m  General: no acute distress , A&Ox3  Lab Results  Component Value Date   CHOL 195 04/30/2021   HDL 61.20 04/30/2021   LDLCALC 116 (H) 04/30/2021   LDLDIRECT 152.7 02/01/2013   TRIG 93.0 04/30/2021   CHOLHDL 3 04/30/2021   Lab Results  Component Value Date   HGBA1C 5.9 04/30/2021    Commons side effects, risks, benefits, and alternatives for medications and treatment plan prescribed today were discussed, and the patient expressed understanding of the given instructions. Patient is instructed to call or message via MyChart if he/she has any questions or concerns regarding our treatment plan. No barriers to understanding were identified. We discussed Red Flag symptoms and signs in detail. Patient expressed  understanding regarding what to do in case of urgent or emergency type symptoms.  Medication list was reconciled, printed and provided to the patient in AVS. Patient instructions and summary information was reviewed with the patient as documented in the AVS. This note was prepared with assistance of Dragon voice recognition software. Occasional wrong-word or sound-a-like substitutions may have occurred due to the inherent limitations of voice recognition software  This visit occurred during the SARS-CoV-2 public health emergency.  Safety protocols were in place, including screening questions prior to the visit, additional usage of staff PPE, and extensive cleaning of exam room while observing appropriate contact time as indicated for disinfecting solutions.

## 2021-08-06 NOTE — Patient Instructions (Signed)
Please return in November to recheck cholesterol and sugar levels.   I have placed a referral to pulmonology to evaluate you for sleep apnea.   If you have any questions or concerns, please don't hesitate to send me a message via MyChart or call the office at [phone]. Thank you for visiting with Morgan [name] today! It's our pleasure caring for you.   Sleep Apnea Sleep apnea is a condition in which breathing pauses or becomes shallow during sleep. People with sleep apnea usually snore loudly. They may have times when they gasp and stop breathing for 10 seconds or more during sleep. This mayhappen many times during the night. Sleep apnea disrupts your sleep and keeps your body from getting the rest that it needs. This condition can increase your risk of certain health problems, including: Heart attack. Stroke. Obesity. Type 2 diabetes. Heart failure. Irregular heartbeat. High blood pressure. The goal of treatment is to help you breathe normally again. What are the causes? The most common cause of sleep apnea is a collapsed or blocked airway. There are three kinds of sleep apnea: Obstructive sleep apnea. This kind is caused by a blocked or collapsed airway. Central sleep apnea. This kind happens when the part of the brain that controls breathing does not send the correct signals to the muscles that control breathing. Mixed sleep apnea. This is a combination of obstructive and central sleep apnea. What increases the risk? You are more likely to develop this condition if you: Are overweight. Smoke. Have a smaller than normal airway. Are older. Are female. Drink alcohol. Take sedatives or tranquilizers. Have a family history of sleep apnea. Have a tongue or tonsils that are larger than normal. What are the signs or symptoms? Symptoms of this condition include: Trouble staying asleep. Loud snoring. Morning headaches. Waking up gasping. Dry mouth or sore throat in the morning. Daytime  sleepiness and tiredness. If you have daytime fatigue because of sleep apnea, you may be more likely to have: Trouble concentrating. Forgetfulness. Irritability or mood swings. Personality changes. Feelings of depression. Sexual dysfunction. This may include loss of interest if you are female, or erectile dysfunction if you are female. How is this diagnosed? This condition may be diagnosed with: A medical history. A physical exam. A series of tests that are done while you are sleeping (sleep study). These tests are usually done in a sleep lab, but they may also be done at home. How is this treated? Treatment for this condition aims to restore normal breathing and to ease symptoms during sleep. It may involve managing health issues that can affect breathing, such as high blood pressure or obesity. Treatment may include: Sleeping on your side. Using a decongestant if you have nasal congestion. Avoiding the use of depressants, including alcohol, sedatives, and narcotics. Losing weight if you are overweight. Making changes to your diet. Quitting smoking. Using a device to open your airway while you sleep, such as: An oral appliance. This is a custom-made mouthpiece that shifts your lower jaw forward. A continuous positive airway pressure (CPAP) device. This device blows air through a mask when you breathe out (exhale). A nasal expiratory positive airway pressure (EPAP) device. This device has valves that you put into each nostril. A bi-level positive airway pressure (BPAP) device. This device blows air through a mask when you breathe in (inhale) and breathe out (exhale). Having surgery if other treatments do not work. During surgery, excess tissue is removed to create a wider airway. Follow these instructions  at home: Lifestyle Make any lifestyle changes that your health care provider recommends. Eat a healthy, well-balanced diet. Take steps to lose weight if you are overweight. Avoid using  depressants, including alcohol, sedatives, and narcotics. Do not use any products that contain nicotine or tobacco. These products include cigarettes, chewing tobacco, and vaping devices, such as e-cigarettes. If you need help quitting, ask your health care provider. General instructions Take over-the-counter and prescription medicines only as told by your health care provider. If you were given a device to open your airway while you sleep, use it only as told by your health care provider. If you are having surgery, make sure to tell your health care provider you have sleep apnea. You may need to bring your device with you. Keep all follow-up visits. This is important. Contact a health care provider if: The device that you received to open your airway during sleep is uncomfortable or does not seem to be working. Your symptoms do not improve. Your symptoms get worse. Get help right away if: You develop: Chest pain. Shortness of breath. Discomfort in your back, arms, or stomach. You have: Trouble speaking. Weakness on one side of your body. Drooping in your face. These symptoms may represent a serious problem that is an emergency. Do not wait to see if the symptoms will go away. Get medical help right away. Call your local emergency services (911 in the [place] Do not drive yourself to the hospital. Summary Sleep apnea is a condition in which breathing pauses or becomes shallow during sleep. The most common cause is a collapsed or blocked airway. The goal of treatment is to restore normal breathing and to ease symptoms during sleep. This information is not intended to replace advice given to you by your health care provider. Make sure you discuss any questions you have with your healthcare provider. Document Revised: 11/24/2020 Document Reviewed: 11/24/2020 Elsevier Patient Education  2022 [employer].

## 2021-09-11 ENCOUNTER — Other Ambulatory Visit: Payer: Self-pay | Admitting: Family Medicine

## 2021-09-19 ENCOUNTER — Institutional Professional Consult (permissible substitution): Payer: Medicare HMO | Admitting: Pulmonary Disease

## 2021-09-20 ENCOUNTER — Other Ambulatory Visit: Payer: Self-pay

## 2021-09-20 ENCOUNTER — Ambulatory Visit (INDEPENDENT_AMBULATORY_CARE_PROVIDER_SITE_OTHER): Payer: Medicare HMO | Admitting: Pulmonary Disease

## 2021-09-20 ENCOUNTER — Encounter: Payer: Self-pay | Admitting: Pulmonary Disease

## 2021-09-20 VITALS — BP 110/70 | HR 67 | Temp 97.9°F | Ht 63.0 in | Wt 149.8 lb

## 2021-09-20 DIAGNOSIS — G4733 Obstructive sleep apnea (adult) (pediatric): Secondary | ICD-10-CM

## 2021-09-20 NOTE — Progress Notes (Signed)
Morgan [name]    [id number]    06/03/1951  Primary Care Physician:Andy, Karie Fetch, MD  Referring Physician: Leamon Arnt, [address] [place],  [place] [address]  Chief complaint:   Patient being seen for concern for obstructive sleep apnea  HPI:  She had had a sleep study done over 10 years ago She did try CPAP for a while, she could not tolerate it back pain, she does not recollect specifically whether was a machine, mask or pressure issue  She is willing to try CPAP if diagnosed with sleep apnea  She has been told in the past about snoring and recently went on a vacation with a friend who noted snoring and erratic breathing during sleep  Usually goes to bed between 10 and 11 Falls asleep in about 30 minutes About 3 bathroom breaks during the night sometimes with her dog out Final wake up time between 930 and 10 AM  Weight has been stable  She does have a pet dog  Never smoker  Outpatient Encounter Medications as of 09/20/2021  Medication Sig   atorvastatin (LIPITOR) 10 MG tablet TAKE 1 TABLET BY MOUTH EVERY DAY   DULoxetine (CYMBALTA) 30 MG capsule TAKE ONE CAPSULE BY MOUTH ONE TIME DAILY   levothyroxine (SYNTHROID) 50 MCG tablet TAKE 1 TABLET (50 MCG TOTAL) BY MOUTH DAILY BEFORE BREAKFAST. EVERY TUESDAY AND THURSDAY   levothyroxine (SYNTHROID) 75 MCG tablet TAKE 1 TABLET BY MOUTH EVERY DAY   valACYclovir (VALTREX) 1000 MG tablet TAKE 2 TABLETS BY MOUTH TWICE A DAY ONCE AS NEEDED FOR COLD SORES   azelastine (ASTELIN) 0.1 % nasal spray Place 2 sprays into both nostrils 2 (two) times daily. (Patient not taking: Reported on 09/20/2021)   diazepam (VALIUM) 5 MG tablet TAKE 1/2 TO 1 TABLET BY MOUTH EVERY 8 HOURS AS NEEDED FOR ANXIETY (Patient not taking: Reported on 09/20/2021)   No facility-administered encounter medications on file as of 09/20/2021.    Allergies as of 09/20/2021 - Review Complete 09/20/2021  Allergen Reaction Noted    Simvastatin Other (See Comments) 09/13/2014   Sulfonamide derivatives Rash     Past Medical History:  Diagnosis Date   Allergy    Anxiety and depression 02/22/2014   Arthritis    Depression    Diastolic dysfunction    Negative Nuclear Stress Test 08/09/12.   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 No CPAP.   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   Seasonal allergies    Sleep apnea    mild not on CPAP   Tinnitus     Past Surgical History:  Procedure Laterality Date   COLONOSCOPY     POLYPECTOMY     Colon   REPLACEMENT TOTAL KNEE Right    TOTAL KNEE ARTHROPLASTY Right 04/22/2007   TOTAL KNEE ARTHROPLASTY  11/30/2012   Procedure: TOTAL KNEE ARTHROPLASTY;  Surgeon: Gearlean Alf, MD;  Location: [facility];  Service: Orthopedics;  Laterality: Left;    Family History  Problem Relation Age of Onset   Heart attack Father    Hyperlipidemia Father    Heart disease Father    Diabetes Father    Brain cancer Father    Macular degeneration Mother  Diabetes Brother    Migraines Daughter    Pulmonary embolism Daughter    Diabetes Paternal Grandfather    Arthritis Other    Breast cancer Maternal Aunt    Breast cancer Paternal Aunt    Colon polyps Neg Hx    Colon cancer Neg Hx    Esophageal cancer Neg Hx    Rectal cancer Neg Hx    Stomach cancer Neg Hx     Social History   Socioeconomic History   Marital status: Widowed    Spouse name: Not on file   Number of children: Not on file   Years of education: Not on file   Highest education level: Not on file  Occupational History    Comment: retired   Tobacco Use   Smoking status: Never   Smokeless tobacco: Never  Substance and Sexual Activity   Alcohol use: Yes    Comment: rare   Drug use: No   Sexual activity: Yes     Comment: lives with husband, 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 No major dietary restrictions  Other Topics Concern   Not on file  Social History Narrative   Not on file   Social Determinants of Health   Financial Resource Strain: Low Risk    Difficulty of Paying Living Expenses: Not hard at all  Food Insecurity: No Food Insecurity   Worried About Charity fundraiser in the Last Year: Never true   [place] in the Last Year: Never true  Transportation Needs: No Transportation Needs   Lack of Transportation (Medical): No   Lack of Transportation (Non-Medical): No  Physical Activity: Inactive   Days of Exercise per Week: 0 days   Minutes of Exercise per Session: 0 min  Stress: No Stress Concern Present   Feeling of Stress : Not at all  Social Connections: Unknown   Frequency of Communication with Friends and Family: More than three times a week   Frequency of Social Gatherings with Friends and Family: Three times a week   Attends Religious Services: Not on file   Active Member of Clubs or Organizations: Yes   Attends Club or Organization Meetings: 1 to 4 times per year   Marital Status: Widowed  Human resources officer Violence: Not At Risk   Fear of Current or Ex-Partner: No   Emotionally Abused: No   Physically Abused: No   Sexually Abused: No    Review of Systems  Constitutional:  Negative for fatigue.  Respiratory:  Positive for apnea.   Psychiatric/Behavioral:  Positive for sleep disturbance.    Vitals:   09/20/21 1330  BP: 110/70  Pulse: 67  Temp: 97.9 F (36.6 C)  SpO2: 99%     Physical Exam Constitutional:      Appearance: Normal appearance.  HENT:     Nose: Nose normal.     Mouth/Throat:     Mouth: Mucous membranes are moist.     Comments: Mallampati 3, crowded oropharynx Cardiovascular:     Rate and Rhythm: Normal rate and regular rhythm.     Heart sounds: No murmur heard.   No friction rub.  Pulmonary:     Effort: No respiratory distress.      Breath sounds: No stridor. No wheezing or rhonchi.  Musculoskeletal:     Cervical back: No rigidity or tenderness.  Neurological:     Mental Status: She is alert.  Psychiatric:        Mood and Affect: Mood normal.  Epworth Sleepiness Scale of 3  Data Reviewed: Previous study not available  Assessment:  Past history of obstructive sleep apnea -Did not tolerate CPAP -This was about 10 years ago -She is willing to try CPAP if found to have significant sleep apnea  Pathophysiology of sleep disordered breathing discussed with the patient Treatment options for sleep disordered breathing discussed with the patient  Plan/Recommendations: We will schedule the patient for an in lab study as there is only mild to moderate likelihood of a positive study  Will schedule as a split-night study, she did have just a diagnostic study the last time she had one-was also scheduled as a split-night study  I will see her back in the office in about 3 to 4 months  Encouraged to call with any significant concerns   Sherrilyn Rist MD [facility] 09/20/2021, 1:38 PM  CC: Leamon Arnt, MD

## 2021-09-20 NOTE — Patient Instructions (Signed)
We will schedule you for an in lab study  We will try and diagnose and possibly treat during the same setting May end up being a diagnostic study like he had the last time if you do not get to sleep very well  CPAP therapy will be prescribed depending on findings  Call with significant concerns  Sleep Apnea Sleep apnea affects breathing during sleep. It causes breathing to stop for 10 seconds or more, or to become shallow. People with sleep apnea usually snore loudly. It can also increase the risk of: Heart attack. Stroke. Being very overweight (obese). Diabetes. Heart failure. Irregular heartbeat. High blood pressure. The goal of treatment is to help you breathe normally again. What are the causes? The most common cause of this condition is a collapsed or blocked airway. There are three kinds of sleep apnea: Obstructive sleep apnea. This is caused by a blocked or collapsed airway. Central sleep apnea. This happens when the brain does not send the right signals to the muscles that control breathing. Mixed sleep apnea. This is a combination of obstructive and central sleep apnea. What increases the risk? Being overweight. Smoking. Having a small airway. Being older. Being female. Drinking alcohol. Taking medicines to calm yourself (sedatives or tranquilizers). Having family members with the condition. Having a tongue or tonsils that are larger than normal. What are the signs or symptoms? Trouble staying asleep. Loud snoring. Headaches in the morning. Waking up gasping. Dry mouth or sore throat in the morning. Being sleepy or tired during the day. If you are sleepy or tired during the day, you may also: Not be able to focus your mind (concentrate). Forget things. Get angry a lot and have mood swings. Feel sad (depressed). Have changes in your personality. Have less interest in sex, if you are female. Be unable to have an erection, if you are female. How is this  treated?  Sleeping on your side. Using a medicine to get rid of mucus in your nose (decongestant). Avoiding the use of alcohol, medicines to help you relax, or certain pain medicines (narcotics). Losing weight, if needed. Changing your diet. Quitting smoking. Using a machine to open your airway while you sleep, such as: An oral appliance. This is a mouthpiece that shifts your lower jaw forward. A CPAP device. This device blows air through a mask when you breathe out (exhale). An EPAP device. This has valves that you put in each nostril. A BPAP device. This device blows air through a mask when you breathe in (inhale) and breathe out. Having surgery if other treatments do not work. Follow these instructions at home: Lifestyle Make changes that your doctor recommends. Eat a healthy diet. Lose weight if needed. Avoid alcohol, medicines to help you relax, and some pain medicines. Do not smoke or use any products that contain nicotine or tobacco. If you need help quitting, ask your doctor. General instructions Take over-the-counter and prescription medicines only as told by your doctor. If you were given a machine to use while you sleep, use it only as told by your doctor. If you are having surgery, make sure to tell your doctor you have sleep apnea. You may need to bring your device with you. Keep all follow-up visits. Contact a doctor if: The machine that you were given to use during sleep bothers you or does not seem to be working. You do not get better. You get worse. Get help right away if: Your chest hurts. You have trouble breathing in  enough air. You have an uncomfortable feeling in your back, arms, or stomach. You have trouble talking. One side of your body feels weak. A part of your face is hanging down. These symptoms may be an emergency. Get help right away. Call your local emergency services (911 in the [place] Do not wait to see if the symptoms will go away. Do not drive  yourself to the hospital. Summary This condition affects breathing during sleep. The most common cause is a collapsed or blocked airway. The goal of treatment is to help you breathe normally while you sleep. This information is not intended to replace advice given to you by your health care provider. Make sure you discuss any questions you have with your health care provider. Document Revised: 11/24/2020 Document Reviewed: 11/24/2020 Elsevier Patient Education  2022 [employer].

## 2021-09-27 DIAGNOSIS — L814 Other melanin hyperpigmentation: Secondary | ICD-10-CM | POA: Diagnosis not present

## 2021-09-27 DIAGNOSIS — L821 Other seborrheic keratosis: Secondary | ICD-10-CM | POA: Diagnosis not present

## 2021-09-27 DIAGNOSIS — L111 Transient acantholytic dermatosis [Grover]: Secondary | ICD-10-CM | POA: Diagnosis not present

## 2021-09-27 DIAGNOSIS — L820 Inflamed seborrheic keratosis: Secondary | ICD-10-CM | POA: Diagnosis not present

## 2021-09-27 DIAGNOSIS — D485 Neoplasm of uncertain behavior of skin: Secondary | ICD-10-CM | POA: Diagnosis not present

## 2021-10-03 DIAGNOSIS — J301 Allergic rhinitis due to pollen: Secondary | ICD-10-CM | POA: Diagnosis not present

## 2021-10-03 DIAGNOSIS — J3081 Allergic rhinitis due to animal (cat) (dog) hair and dander: Secondary | ICD-10-CM | POA: Diagnosis not present

## 2021-10-03 DIAGNOSIS — J3089 Other allergic rhinitis: Secondary | ICD-10-CM | POA: Diagnosis not present

## 2021-10-03 DIAGNOSIS — J309 Allergic rhinitis, unspecified: Secondary | ICD-10-CM | POA: Diagnosis not present

## 2021-10-18 DIAGNOSIS — J301 Allergic rhinitis due to pollen: Secondary | ICD-10-CM | POA: Diagnosis not present

## 2021-10-18 DIAGNOSIS — J3081 Allergic rhinitis due to animal (cat) (dog) hair and dander: Secondary | ICD-10-CM | POA: Diagnosis not present

## 2021-10-18 DIAGNOSIS — J3089 Other allergic rhinitis: Secondary | ICD-10-CM | POA: Diagnosis not present

## 2021-10-26 DIAGNOSIS — J3089 Other allergic rhinitis: Secondary | ICD-10-CM | POA: Diagnosis not present

## 2021-10-26 DIAGNOSIS — J301 Allergic rhinitis due to pollen: Secondary | ICD-10-CM | POA: Diagnosis not present

## 2021-10-26 DIAGNOSIS — J3081 Allergic rhinitis due to animal (cat) (dog) hair and dander: Secondary | ICD-10-CM | POA: Diagnosis not present

## 2021-10-30 DIAGNOSIS — J3089 Other allergic rhinitis: Secondary | ICD-10-CM | POA: Diagnosis not present

## 2021-10-30 DIAGNOSIS — J301 Allergic rhinitis due to pollen: Secondary | ICD-10-CM | POA: Diagnosis not present

## 2021-10-30 DIAGNOSIS — J3081 Allergic rhinitis due to animal (cat) (dog) hair and dander: Secondary | ICD-10-CM | POA: Diagnosis not present

## 2021-11-01 DIAGNOSIS — J301 Allergic rhinitis due to pollen: Secondary | ICD-10-CM | POA: Diagnosis not present

## 2021-11-01 DIAGNOSIS — J3089 Other allergic rhinitis: Secondary | ICD-10-CM | POA: Diagnosis not present

## 2021-11-01 DIAGNOSIS — J3081 Allergic rhinitis due to animal (cat) (dog) hair and dander: Secondary | ICD-10-CM | POA: Diagnosis not present

## 2021-11-05 DIAGNOSIS — J3089 Other allergic rhinitis: Secondary | ICD-10-CM | POA: Diagnosis not present

## 2021-11-05 DIAGNOSIS — J3081 Allergic rhinitis due to animal (cat) (dog) hair and dander: Secondary | ICD-10-CM | POA: Diagnosis not present

## 2021-11-05 DIAGNOSIS — J301 Allergic rhinitis due to pollen: Secondary | ICD-10-CM | POA: Diagnosis not present

## 2021-11-08 DIAGNOSIS — J3081 Allergic rhinitis due to animal (cat) (dog) hair and dander: Secondary | ICD-10-CM | POA: Diagnosis not present

## 2021-11-08 DIAGNOSIS — J3089 Other allergic rhinitis: Secondary | ICD-10-CM | POA: Diagnosis not present

## 2021-11-08 DIAGNOSIS — J301 Allergic rhinitis due to pollen: Secondary | ICD-10-CM | POA: Diagnosis not present

## 2021-11-12 ENCOUNTER — Ambulatory Visit: Payer: Medicare HMO | Admitting: Family Medicine

## 2021-11-12 DIAGNOSIS — J3081 Allergic rhinitis due to animal (cat) (dog) hair and dander: Secondary | ICD-10-CM | POA: Diagnosis not present

## 2021-11-12 DIAGNOSIS — J3089 Other allergic rhinitis: Secondary | ICD-10-CM | POA: Diagnosis not present

## 2021-11-12 DIAGNOSIS — J301 Allergic rhinitis due to pollen: Secondary | ICD-10-CM | POA: Diagnosis not present

## 2021-11-13 ENCOUNTER — Ambulatory Visit (HOSPITAL_BASED_OUTPATIENT_CLINIC_OR_DEPARTMENT_OTHER): Payer: Medicare HMO | Attending: Pulmonary Disease | Admitting: Pulmonary Disease

## 2021-11-13 ENCOUNTER — Ambulatory Visit (INDEPENDENT_AMBULATORY_CARE_PROVIDER_SITE_OTHER): Payer: Medicare HMO | Admitting: Family Medicine

## 2021-11-13 ENCOUNTER — Other Ambulatory Visit: Payer: Self-pay

## 2021-11-13 ENCOUNTER — Encounter: Payer: Self-pay | Admitting: Family Medicine

## 2021-11-13 VITALS — BP 130/76 | HR 77 | Temp 97.3°F | Wt 152.4 lb

## 2021-11-13 DIAGNOSIS — R7301 Impaired fasting glucose: Secondary | ICD-10-CM | POA: Diagnosis not present

## 2021-11-13 DIAGNOSIS — G4733 Obstructive sleep apnea (adult) (pediatric): Secondary | ICD-10-CM | POA: Insufficient documentation

## 2021-11-13 DIAGNOSIS — E782 Mixed hyperlipidemia: Secondary | ICD-10-CM | POA: Diagnosis not present

## 2021-11-13 DIAGNOSIS — E039 Hypothyroidism, unspecified: Secondary | ICD-10-CM

## 2021-11-13 DIAGNOSIS — M810 Age-related osteoporosis without current pathological fracture: Secondary | ICD-10-CM | POA: Diagnosis not present

## 2021-11-13 DIAGNOSIS — G4736 Sleep related hypoventilation in conditions classified elsewhere: Secondary | ICD-10-CM | POA: Insufficient documentation

## 2021-11-13 DIAGNOSIS — I493 Ventricular premature depolarization: Secondary | ICD-10-CM | POA: Diagnosis not present

## 2021-11-13 LAB — LIPID PANEL
Cholesterol: 173 mg/dL (ref 0–200)
HDL: 63.6 mg/dL (ref >39.00)
LDL Cholesterol: 92 mg/dL (ref 0–99)
NonHDL: 109.23
Total CHOL/HDL Ratio: 3
Triglycerides: 86 mg/dL (ref 0.0–149.0)
VLDL: 17.2 mg/dL (ref 0.0–40.0)

## 2021-11-13 LAB — COMPREHENSIVE METABOLIC PANEL
ALT: 15 U/L (ref 0–35)
AST: 20 U/L (ref 0–37)
Albumin: 4.3 g/dL (ref 3.5–5.2)
Alkaline Phosphatase: 86 U/L (ref 39–117)
BUN: 23 mg/dL (ref 6–23)
CO2: 29 mEq/L (ref 19–32)
Calcium: 9.6 mg/dL (ref 8.4–10.5)
Chloride: 106 mEq/L (ref 96–112)
Creatinine, Ser: 0.81 mg/dL (ref 0.40–1.20)
GFR: 72.53 mL/min (ref >60.00)
Glucose, Bld: 115 mg/dL — ABNORMAL HIGH (ref 70–99)
Potassium: 5.2 mEq/L — ABNORMAL HIGH (ref 3.5–5.1)
Sodium: 143 mEq/L (ref 135–145)
Total Bilirubin: 0.5 mg/dL (ref 0.2–1.2)
Total Protein: 7 g/dL (ref 6.0–8.3)

## 2021-11-13 LAB — TSH: TSH: 0.95 u[IU]/mL (ref 0.35–5.50)

## 2021-11-13 LAB — HEMOGLOBIN A1C: Hgb A1c MFr Bld: 5.7 % (ref 4.6–6.5)

## 2021-11-13 NOTE — Patient Instructions (Signed)
Please return in May 2023 for your annual complete physical; please come fasting.   I will release your lab results to you on your MyChart account with further instructions. Please reply with any questions.    Good luck with your sleep study.   If you have any questions or concerns, please don't hesitate to send me a message via MyChart or call the office at [phone]. Thank you for visiting with [place] today! It's our pleasure caring for you.   I have ordered a mammogram and/or bone density for you as we discussed today: they are due in January 2023. [x]   Mammogram  [x]   Bone Density  Please call the office checked below to schedule your appointment:  [x]   [facility]      [address] [place], [place]         []   [facility]  [address] [place], [place]

## 2021-11-13 NOTE — Progress Notes (Signed)
Subjective  CC:  Chief Complaint  Patient presents with   Hyperlipidemia   Hypothyroidism    HPI: Morgan [name] is a 70 y.o. female who presents to the office today to address the problems listed above in the chief complaint. Hyperlipidemia: Tolerating Lipitor 10 mg daily.  Due for recheck.  No adverse effects.  Nonfasting today. The 10-year ASCVD risk score (Arnett DK, et al., 2019) is: 11.9%   Values used to calculate the score:     Age: 37 years     Sex: Female     Is Non-Hispanic African American: No     Diabetic: No     Tobacco smoker: No     Systolic Blood Pressure: 758 mmHg     Is BP treated: No     HDL Cholesterol: 61.2 mg/dL     Total Cholesterol: 195 mg/dL Lab Results  Component Value Date   CHOL 195 04/30/2021   HDL 61.20 04/30/2021   LDLCALC 116 (H) 04/30/2021   LDLDIRECT 152.7 02/01/2013   TRIG 93.0 04/30/2021   CHOLHDL 3 04/30/2021   Hypothyroidism on levothyroxine 75 mcg daily.  Last checked 3 months ago and it was at goal but we had changed dose prior so we will recheck today.  Clinically euthyroid. Impaired fasting glucose: Eating well.  Due for recheck.  No symptoms of hyperglycemia Osteoporosis: Due for bone density.  Patient had deferred treatment when last checked. For sleep study today.  History of sleep apnea. Allergies: Seeing allergist and started immunotherapy again.  Dr. Donneta Romberg.  She is hopeful will help her symptoms  Assessment  1. Mixed hyperlipidemia   2. IFG (impaired fasting glucose)   3. Osteoporosis, post-menopausal   4. Acquired hypothyroidism   5. OSA (obstructive sleep apnea)      Plan  Hyperlipidemia: Continue Lipitor 10.  Recheck levels.  Nonfasting Hypothyroidism: Clinically euthyroid.  Recheck to ensure stability IFG: Check A1c Osteoporosis: Ordered bone density.  If worsening, start treatment.  Patient agrees Likely OSA: Poor sleep study today.  May need CPAP.  Follow up: For complete physical in  May 05/06/2022  Orders Placed This Encounter  Procedures   DG Bone Density   Hemoglobin A1c   Comprehensive metabolic panel   Lipid panel   TSH   No orders of the defined types were placed in this encounter.     I reviewed the patients updated PMH, FH, and SocHx.    Patient Active Problem List   Diagnosis Date Noted   OSA (obstructive sleep apnea), not using CPAP 07/04/2010    Priority: High   Mixed hyperlipidemia 07/14/2007    Priority: High   Acquired hypothyroidism 06/29/2007    Priority: High   Major depression, recurrent, chronic ([place]) 09/25/2020    Priority: Medium    IFG (impaired fasting glucose) 05/19/2020    Priority: Medium    Osteoporosis, post-menopausal 11/12/2019    Priority: Medium    Colon polyps 11/12/2019    Priority: Medium    GAD (generalized anxiety disorder) 02/22/2014    Priority: Medium    Diastolic dysfunction 83/25/4982    Priority: Medium    Osteoarthritis 06/29/2007    Priority: Medium    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   diazepam (VALIUM) 5 MG tablet TAKE 1/2 TO 1 TABLET BY MOUTH EVERY 8 HOURS AS NEEDED FOR ANXIETY   DULoxetine (CYMBALTA) 30 MG capsule TAKE ONE CAPSULE BY MOUTH ONE TIME DAILY   levothyroxine (SYNTHROID) 50 MCG tablet TAKE 1 TABLET (50 MCG TOTAL) BY MOUTH DAILY BEFORE BREAKFAST. EVERY TUESDAY AND THURSDAY   levothyroxine (SYNTHROID) 75 MCG tablet TAKE 1 TABLET BY MOUTH EVERY DAY   valACYclovir (VALTREX) 1000 MG tablet TAKE 2 TABLETS BY MOUTH TWICE A DAY ONCE AS NEEDED FOR COLD SORES     Allergies: Patient is allergic to simvastatin and sulfonamide derivatives. Family History: Patient family history includes Arthritis in an other family member; Brain cancer in her father; Breast cancer in her maternal aunt and paternal aunt; Diabetes in her brother, father, and paternal grandfather; Heart attack in her father; Heart disease in her father; Hyperlipidemia in her father; Macular degeneration in her mother; Migraines in her daughter; Pulmonary embolism in her daughter. Social History:  Patient  reports that she has never smoked. She has never used smokeless tobacco. She reports current alcohol use. She reports that she does not use drugs.  Review of Systems: Constitutional: Negative for fever malaise or anorexia Cardiovascular: negative for chest pain Respiratory: negative for SOB or persistent cough Gastrointestinal: negative for abdominal pain  Objective  Vitals: BP 130/76   Pulse 77   Temp (!) 97.3 F (36.3 C) (Temporal)   Wt 152 lb 6.4 oz (69.1 kg)   SpO2 99%   BMI 27.00 kg/m  General: no acute distress , A&Ox3 HEENT: PEERL, conjunctiva normal, neck is supple Cardiovascular:  RRR without murmur or gallop.  Respiratory:  Good breath sounds bilaterally, CTAB with normal respiratory effort Skin:  Warm, no rashes    Commons side effects, risks, benefits, and alternatives for medications and treatment plan prescribed today were discussed, and the patient expressed understanding of the given instructions. Patient is instructed to call or message via MyChart if he/she has any questions or concerns regarding our treatment plan. No barriers to understanding were identified. We discussed Red Flag symptoms and signs in detail. Patient expressed understanding regarding what to do in case of urgent or emergency type symptoms.  Medication list was reconciled, printed and provided to the patient in AVS. Patient instructions and summary information was reviewed with the patient  as documented in the AVS. This note was prepared with assistance of Dragon voice recognition software. Occasional wrong-word or sound-a-like substitutions may have occurred due to the inherent limitations of voice recognition software  This visit occurred during the SARS-CoV-2 public health emergency.  Safety protocols were in place, including screening questions prior to the visit, additional usage of staff PPE, and extensive cleaning of exam room while observing appropriate contact time as indicated for disinfecting solutions.

## 2021-11-16 DIAGNOSIS — J3089 Other allergic rhinitis: Secondary | ICD-10-CM | POA: Diagnosis not present

## 2021-11-16 DIAGNOSIS — J301 Allergic rhinitis due to pollen: Secondary | ICD-10-CM | POA: Diagnosis not present

## 2021-11-16 DIAGNOSIS — J3081 Allergic rhinitis due to animal (cat) (dog) hair and dander: Secondary | ICD-10-CM | POA: Diagnosis not present

## 2021-11-19 DIAGNOSIS — J3081 Allergic rhinitis due to animal (cat) (dog) hair and dander: Secondary | ICD-10-CM | POA: Diagnosis not present

## 2021-11-19 DIAGNOSIS — J3089 Other allergic rhinitis: Secondary | ICD-10-CM | POA: Diagnosis not present

## 2021-11-19 DIAGNOSIS — J301 Allergic rhinitis due to pollen: Secondary | ICD-10-CM | POA: Diagnosis not present

## 2021-11-20 ENCOUNTER — Ambulatory Visit: Payer: Medicare HMO | Admitting: Family Medicine

## 2021-11-21 DIAGNOSIS — J301 Allergic rhinitis due to pollen: Secondary | ICD-10-CM | POA: Diagnosis not present

## 2021-11-21 DIAGNOSIS — J3089 Other allergic rhinitis: Secondary | ICD-10-CM | POA: Diagnosis not present

## 2021-11-21 DIAGNOSIS — J3081 Allergic rhinitis due to animal (cat) (dog) hair and dander: Secondary | ICD-10-CM | POA: Diagnosis not present

## 2021-11-23 ENCOUNTER — Telehealth: Payer: Self-pay | Admitting: Pulmonary Disease

## 2021-11-23 DIAGNOSIS — G4733 Obstructive sleep apnea (adult) (pediatric): Secondary | ICD-10-CM

## 2021-11-23 NOTE — Telephone Encounter (Signed)
Call patient  Sleep study result  Date of study: 11/13/2021  Impression: Moderate obstructive sleep apnea  Recommendation: DME referral  Recommend CPAP therapy for moderate obstructive sleep apnea  Auto titrating CPAP with pressure settings of 5-15 will be appropriate  Encourage weight loss measures  Follow-up in the office 4 to 6 weeks following initiation of treatment

## 2021-11-23 NOTE — Procedures (Signed)
POLYSOMNOGRAPHY  Last, First: Morgan, [name] MRN: [id number] Gender: Female Age (years): 22 Weight (lbs): 150 DOB: 23-Dec-1951 BMI: 27 Primary Care: No PCP Epworth Score: 3 Referring: Morgan Coder MD Technician: Laren Everts Interpreting: Morgan Coder MD Study Type: NPSG Ordered Study Type: Split Night CPAP Study date: 11/13/2021 Location: [facility] CLINICAL INFORMATION Morgan [name] is a 70 year old Female and was referred to the sleep center for evaluation of G47.33 OSA: Adult and Pediatric (327.23). Indications include Fatigue, OSA, Snoring.  MEDICATIONS Patient self administered medications include: MELATONIN. Medications administered during study include No sleep medicine administered.  SLEEP STUDY TECHNIQUE A multi-channel overnight Polysomnography study was performed. The channels recorded and monitored were central and occipital EEG, electrooculogram (EOG), submentalis EMG (chin), nasal and oral airflow, thoracic and abdominal wall motion, anterior tibialis EMG, snore microphone, electrocardiogram, and a pulse oximetry. TECHNICIAN COMMENTS Comments added by Technician: None Comments added by Scorer: N/A SLEEP ARCHITECTURE The study was initiated at 10:25:22 PM and terminated at 4:56:49 AM. The total recorded time was 391.4 minutes. EEG confirmed total sleep time was 316 minutes yielding a sleep efficiency of 80.7%%. Sleep onset after lights out was 20.6 minutes with a REM latency of 334.0 minutes. The patient spent 15.5%% of the night in stage N1 sleep, 83.7%% in stage N2 sleep, 0.2%% in stage N3 and 0.6% in REM. Wake after sleep onset (WASO) was 54.8 minutes. The Arousal Index was 32.8/hour. RESPIRATORY PARAMETERS There were a total of 83 respiratory disturbances out of which 10 were apneas ( 6 obstructive, 0 mixed, 4 central) and 73 hypopneas. The apnea/hypopnea index (AHI) was 15.8 events/hour. The central sleep apnea index was 0.8 events/hour. The REM AHI was 90.0  events/hour and NREM AHI was 15.3 events/hour. The supine AHI was 27.5 events/hour and the non supine AHI was 9.8 supine during 33.86% of sleep. Respiratory disturbances were associated with oxygen desaturation down to a nadir of 81.0% during sleep. The mean oxygen saturation during the study was 94.3%. The cumulative time under 88% oxygen saturation was 5.5 minutes.  LEG MOVEMENT DATA The total leg movements were 4 with a resulting leg movement index of 0.8/hr .Associated arousal with leg movement index was 0.2/hr.  CARDIAC DATA The underlying cardiac rhythm was most consistent with sinus rhythm. Mean heart rate during sleep was 70.2 bpm. Additional rhythm abnormalities include PVCs.  IMPRESSIONS - Moderate Obstructive Sleep apnea(OSA) - Electrocardiographic data showed presence of PVCs. - Moderate Oxygen Desaturation - The patient snored with moderate snoring volume. - No significant periodic leg movements(PLMs) during sleep. However, no significant associated arousals.  DIAGNOSIS - Obstructive Sleep Apnea (G47.33) - Nocturnal Hypoxemia (G47.36)  RECOMMENDATIONS - Therapeutic CPAP titration to determine optimal pressure required to alleviate sleep disordered breathing may be considered. - Auto CPAP may be considered for treatment of moderate obstructive sleep apnea, CPAP setting of 5-15 - Nuance Pro gel nasal pillow mask is mask of choice. - Positional therapy avoiding supine position during sleep. - Avoid alcohol, sedatives and other CNS depressants that may worsen sleep apnea and disrupt normal sleep architecture. - Sleep hygiene should be reviewed to assess factors that may improve sleep quality. - Weight management and regular exercise should be initiated or continued.  [Electronically signed] 11/23/2021 05:35 PM  Sherrilyn Rist MD NPI: [id number]

## 2021-11-26 NOTE — Telephone Encounter (Signed)
I called the patient and gave her the information per the provider and she did not have any questions.   She is agreeable to the CPAP and I have placed the order. She is aware there is a delay on getting the machines and she will call the office to get a follow up once she has a machine. Nothing further needed.

## 2021-11-27 DIAGNOSIS — J3089 Other allergic rhinitis: Secondary | ICD-10-CM | POA: Diagnosis not present

## 2021-11-27 DIAGNOSIS — J3081 Allergic rhinitis due to animal (cat) (dog) hair and dander: Secondary | ICD-10-CM | POA: Diagnosis not present

## 2021-11-27 DIAGNOSIS — J301 Allergic rhinitis due to pollen: Secondary | ICD-10-CM | POA: Diagnosis not present

## 2021-11-29 DIAGNOSIS — J3089 Other allergic rhinitis: Secondary | ICD-10-CM | POA: Diagnosis not present

## 2021-11-29 DIAGNOSIS — J301 Allergic rhinitis due to pollen: Secondary | ICD-10-CM | POA: Diagnosis not present

## 2021-11-29 DIAGNOSIS — J3081 Allergic rhinitis due to animal (cat) (dog) hair and dander: Secondary | ICD-10-CM | POA: Diagnosis not present

## 2021-12-03 ENCOUNTER — Encounter: Payer: Self-pay | Admitting: Family Medicine

## 2021-12-03 DIAGNOSIS — J3081 Allergic rhinitis due to animal (cat) (dog) hair and dander: Secondary | ICD-10-CM | POA: Diagnosis not present

## 2021-12-03 DIAGNOSIS — G4733 Obstructive sleep apnea (adult) (pediatric): Secondary | ICD-10-CM | POA: Insufficient documentation

## 2021-12-03 DIAGNOSIS — J3089 Other allergic rhinitis: Secondary | ICD-10-CM | POA: Diagnosis not present

## 2021-12-03 DIAGNOSIS — G4730 Sleep apnea, unspecified: Secondary | ICD-10-CM

## 2021-12-03 DIAGNOSIS — J301 Allergic rhinitis due to pollen: Secondary | ICD-10-CM | POA: Diagnosis not present

## 2021-12-03 HISTORY — DX: Sleep apnea, unspecified: G47.30

## 2021-12-04 ENCOUNTER — Other Ambulatory Visit: Payer: Self-pay | Admitting: Family Medicine

## 2021-12-04 DIAGNOSIS — Z1231 Encounter for screening mammogram for malignant neoplasm of breast: Secondary | ICD-10-CM

## 2021-12-04 DIAGNOSIS — M810 Age-related osteoporosis without current pathological fracture: Secondary | ICD-10-CM

## 2021-12-05 DIAGNOSIS — J3081 Allergic rhinitis due to animal (cat) (dog) hair and dander: Secondary | ICD-10-CM | POA: Diagnosis not present

## 2021-12-05 DIAGNOSIS — J3089 Other allergic rhinitis: Secondary | ICD-10-CM | POA: Diagnosis not present

## 2021-12-05 DIAGNOSIS — J301 Allergic rhinitis due to pollen: Secondary | ICD-10-CM | POA: Diagnosis not present

## 2021-12-10 DIAGNOSIS — J3081 Allergic rhinitis due to animal (cat) (dog) hair and dander: Secondary | ICD-10-CM | POA: Diagnosis not present

## 2021-12-10 DIAGNOSIS — J301 Allergic rhinitis due to pollen: Secondary | ICD-10-CM | POA: Diagnosis not present

## 2021-12-10 DIAGNOSIS — J3089 Other allergic rhinitis: Secondary | ICD-10-CM | POA: Diagnosis not present

## 2021-12-14 DIAGNOSIS — J3081 Allergic rhinitis due to animal (cat) (dog) hair and dander: Secondary | ICD-10-CM | POA: Diagnosis not present

## 2021-12-14 DIAGNOSIS — J3089 Other allergic rhinitis: Secondary | ICD-10-CM | POA: Diagnosis not present

## 2021-12-14 DIAGNOSIS — J301 Allergic rhinitis due to pollen: Secondary | ICD-10-CM | POA: Diagnosis not present

## 2021-12-18 DIAGNOSIS — Z20822 Contact with and (suspected) exposure to covid-19: Secondary | ICD-10-CM | POA: Diagnosis not present

## 2021-12-18 DIAGNOSIS — J0100 Acute maxillary sinusitis, unspecified: Secondary | ICD-10-CM | POA: Diagnosis not present

## 2021-12-18 DIAGNOSIS — R5383 Other fatigue: Secondary | ICD-10-CM | POA: Diagnosis not present

## 2021-12-18 DIAGNOSIS — Z03818 Encounter for observation for suspected exposure to other biological agents ruled out: Secondary | ICD-10-CM | POA: Diagnosis not present

## 2021-12-19 DIAGNOSIS — J3089 Other allergic rhinitis: Secondary | ICD-10-CM | POA: Diagnosis not present

## 2021-12-19 DIAGNOSIS — J301 Allergic rhinitis due to pollen: Secondary | ICD-10-CM | POA: Diagnosis not present

## 2021-12-19 DIAGNOSIS — J3081 Allergic rhinitis due to animal (cat) (dog) hair and dander: Secondary | ICD-10-CM | POA: Diagnosis not present

## 2021-12-21 DIAGNOSIS — J301 Allergic rhinitis due to pollen: Secondary | ICD-10-CM | POA: Diagnosis not present

## 2021-12-21 DIAGNOSIS — J3089 Other allergic rhinitis: Secondary | ICD-10-CM | POA: Diagnosis not present

## 2021-12-21 DIAGNOSIS — J3081 Allergic rhinitis due to animal (cat) (dog) hair and dander: Secondary | ICD-10-CM | POA: Diagnosis not present

## 2021-12-25 DIAGNOSIS — J3089 Other allergic rhinitis: Secondary | ICD-10-CM | POA: Diagnosis not present

## 2021-12-25 DIAGNOSIS — J3081 Allergic rhinitis due to animal (cat) (dog) hair and dander: Secondary | ICD-10-CM | POA: Diagnosis not present

## 2021-12-25 DIAGNOSIS — J301 Allergic rhinitis due to pollen: Secondary | ICD-10-CM | POA: Diagnosis not present

## 2021-12-28 DIAGNOSIS — J301 Allergic rhinitis due to pollen: Secondary | ICD-10-CM | POA: Diagnosis not present

## 2021-12-28 DIAGNOSIS — J3089 Other allergic rhinitis: Secondary | ICD-10-CM | POA: Diagnosis not present

## 2021-12-28 DIAGNOSIS — J3081 Allergic rhinitis due to animal (cat) (dog) hair and dander: Secondary | ICD-10-CM | POA: Diagnosis not present

## 2021-12-29 ENCOUNTER — Other Ambulatory Visit: Payer: Self-pay | Admitting: Family Medicine

## 2021-12-29 DIAGNOSIS — F5102 Adjustment insomnia: Secondary | ICD-10-CM

## 2022-01-02 ENCOUNTER — Other Ambulatory Visit: Payer: Self-pay

## 2022-01-02 ENCOUNTER — Other Ambulatory Visit: Payer: Self-pay | Admitting: Family Medicine

## 2022-01-02 DIAGNOSIS — F5102 Adjustment insomnia: Secondary | ICD-10-CM

## 2022-01-02 DIAGNOSIS — J3081 Allergic rhinitis due to animal (cat) (dog) hair and dander: Secondary | ICD-10-CM | POA: Diagnosis not present

## 2022-01-02 DIAGNOSIS — J301 Allergic rhinitis due to pollen: Secondary | ICD-10-CM | POA: Diagnosis not present

## 2022-01-02 DIAGNOSIS — J3089 Other allergic rhinitis: Secondary | ICD-10-CM | POA: Diagnosis not present

## 2022-01-02 NOTE — Telephone Encounter (Signed)
Last OV 11/13/2021 dx hyperlipidemia Last Refill 08/03/2020

## 2022-01-08 DIAGNOSIS — J301 Allergic rhinitis due to pollen: Secondary | ICD-10-CM | POA: Diagnosis not present

## 2022-01-08 DIAGNOSIS — J3089 Other allergic rhinitis: Secondary | ICD-10-CM | POA: Diagnosis not present

## 2022-01-08 DIAGNOSIS — J3081 Allergic rhinitis due to animal (cat) (dog) hair and dander: Secondary | ICD-10-CM | POA: Diagnosis not present

## 2022-01-11 DIAGNOSIS — J3081 Allergic rhinitis due to animal (cat) (dog) hair and dander: Secondary | ICD-10-CM | POA: Diagnosis not present

## 2022-01-11 DIAGNOSIS — J3089 Other allergic rhinitis: Secondary | ICD-10-CM | POA: Diagnosis not present

## 2022-01-11 DIAGNOSIS — J301 Allergic rhinitis due to pollen: Secondary | ICD-10-CM | POA: Diagnosis not present

## 2022-01-14 ENCOUNTER — Other Ambulatory Visit: Payer: Self-pay | Admitting: Family Medicine

## 2022-01-17 DIAGNOSIS — J3081 Allergic rhinitis due to animal (cat) (dog) hair and dander: Secondary | ICD-10-CM | POA: Diagnosis not present

## 2022-01-17 DIAGNOSIS — J301 Allergic rhinitis due to pollen: Secondary | ICD-10-CM | POA: Diagnosis not present

## 2022-01-17 DIAGNOSIS — J3089 Other allergic rhinitis: Secondary | ICD-10-CM | POA: Diagnosis not present

## 2022-01-24 DIAGNOSIS — J0100 Acute maxillary sinusitis, unspecified: Secondary | ICD-10-CM | POA: Diagnosis not present

## 2022-01-28 ENCOUNTER — Other Ambulatory Visit: Payer: Self-pay

## 2022-01-28 ENCOUNTER — Ambulatory Visit
Admission: RE | Admit: 2022-01-28 | Discharge: 2022-01-28 | Disposition: A | Discharge status: Home / Self Care | Payer: Medicare HMO | Source: Ambulatory Visit | Attending: Family Medicine | Admitting: Family Medicine

## 2022-01-28 DIAGNOSIS — Z1231 Encounter for screening mammogram for malignant neoplasm of breast: Secondary | ICD-10-CM | POA: Diagnosis not present

## 2022-01-28 DIAGNOSIS — J3089 Other allergic rhinitis: Secondary | ICD-10-CM | POA: Diagnosis not present

## 2022-01-28 DIAGNOSIS — J3081 Allergic rhinitis due to animal (cat) (dog) hair and dander: Secondary | ICD-10-CM | POA: Diagnosis not present

## 2022-01-28 DIAGNOSIS — J301 Allergic rhinitis due to pollen: Secondary | ICD-10-CM | POA: Diagnosis not present

## 2022-02-08 DIAGNOSIS — J301 Allergic rhinitis due to pollen: Secondary | ICD-10-CM | POA: Diagnosis not present

## 2022-02-08 DIAGNOSIS — J3089 Other allergic rhinitis: Secondary | ICD-10-CM | POA: Diagnosis not present

## 2022-02-08 DIAGNOSIS — J3081 Allergic rhinitis due to animal (cat) (dog) hair and dander: Secondary | ICD-10-CM | POA: Diagnosis not present

## 2022-02-14 DIAGNOSIS — J3081 Allergic rhinitis due to animal (cat) (dog) hair and dander: Secondary | ICD-10-CM | POA: Diagnosis not present

## 2022-02-14 DIAGNOSIS — J301 Allergic rhinitis due to pollen: Secondary | ICD-10-CM | POA: Diagnosis not present

## 2022-02-14 DIAGNOSIS — J3089 Other allergic rhinitis: Secondary | ICD-10-CM | POA: Diagnosis not present

## 2022-02-21 ENCOUNTER — Telehealth: Payer: Self-pay | Admitting: Pulmonary Disease

## 2022-02-21 NOTE — Telephone Encounter (Signed)
Referral in from Nov 2022 to [facility]  Sending to [facility] per protocol

## 2022-02-22 DIAGNOSIS — J3081 Allergic rhinitis due to animal (cat) (dog) hair and dander: Secondary | ICD-10-CM | POA: Diagnosis not present

## 2022-02-22 DIAGNOSIS — J3089 Other allergic rhinitis: Secondary | ICD-10-CM | POA: Diagnosis not present

## 2022-02-22 DIAGNOSIS — J301 Allergic rhinitis due to pollen: Secondary | ICD-10-CM | POA: Diagnosis not present

## 2022-02-26 NOTE — Telephone Encounter (Signed)
I called [facility] and spoke to [facility].  He states pt was originally scheduled on 12/5 and she was a "no show".  Notice was mailed to the pt on how to contact them.  She called them this morning and is scheduled for 3/3.  Nothing further needed.

## 2022-03-01 DIAGNOSIS — J3089 Other allergic rhinitis: Secondary | ICD-10-CM | POA: Diagnosis not present

## 2022-03-01 DIAGNOSIS — J301 Allergic rhinitis due to pollen: Secondary | ICD-10-CM | POA: Diagnosis not present

## 2022-03-01 DIAGNOSIS — G4733 Obstructive sleep apnea (adult) (pediatric): Secondary | ICD-10-CM | POA: Diagnosis not present

## 2022-03-01 DIAGNOSIS — J3081 Allergic rhinitis due to animal (cat) (dog) hair and dander: Secondary | ICD-10-CM | POA: Diagnosis not present

## 2022-03-08 DIAGNOSIS — J301 Allergic rhinitis due to pollen: Secondary | ICD-10-CM | POA: Diagnosis not present

## 2022-03-08 DIAGNOSIS — J3081 Allergic rhinitis due to animal (cat) (dog) hair and dander: Secondary | ICD-10-CM | POA: Diagnosis not present

## 2022-03-08 DIAGNOSIS — J3089 Other allergic rhinitis: Secondary | ICD-10-CM | POA: Diagnosis not present

## 2022-03-12 DIAGNOSIS — J301 Allergic rhinitis due to pollen: Secondary | ICD-10-CM | POA: Diagnosis not present

## 2022-03-12 DIAGNOSIS — J3089 Other allergic rhinitis: Secondary | ICD-10-CM | POA: Diagnosis not present

## 2022-03-12 DIAGNOSIS — J3081 Allergic rhinitis due to animal (cat) (dog) hair and dander: Secondary | ICD-10-CM | POA: Diagnosis not present

## 2022-03-14 ENCOUNTER — Telehealth: Payer: Self-pay | Admitting: Pulmonary Disease

## 2022-03-14 DIAGNOSIS — G4733 Obstructive sleep apnea (adult) (pediatric): Secondary | ICD-10-CM

## 2022-03-15 NOTE — Telephone Encounter (Signed)
Called patient and she states that she is having issues with her cpap machine. She states that she with her full face mack it seems a lot of air is being pressed in. She states that when she first places the mask on she is fine but the pressure stays very high and its so much she cant go asleep.  ? ?LOV 08/2021. ? ?Please advise Dr Jenetta Downer  ?

## 2022-03-18 NOTE — Telephone Encounter (Signed)
ATC LVMTCB x 1  

## 2022-03-18 NOTE — Telephone Encounter (Signed)
She has an auto CPAP machine,  so the machine usually fluctuates between a pressure of 5 which is very low pressures to 15. ? ?Most people may struggle with finding the right mask-it is always a trial and error to get a mask that fits the best ? ?We can also limit the pressure at 10, so the machine goes between 5 and 10 ? ?Some people cannot tolerate CPAP regardless of what we try, however, this means we have to look for other means of treating the sleep apnea ?-Watchful waiting ?-Evaluate by dentist for an oral device ?

## 2022-03-19 ENCOUNTER — Other Ambulatory Visit: Payer: Self-pay | Admitting: Family Medicine

## 2022-03-19 ENCOUNTER — Telehealth: Payer: Self-pay

## 2022-03-19 DIAGNOSIS — E782 Mixed hyperlipidemia: Secondary | ICD-10-CM

## 2022-03-19 DIAGNOSIS — J301 Allergic rhinitis due to pollen: Secondary | ICD-10-CM | POA: Diagnosis not present

## 2022-03-19 DIAGNOSIS — J3089 Other allergic rhinitis: Secondary | ICD-10-CM | POA: Diagnosis not present

## 2022-03-19 DIAGNOSIS — J3081 Allergic rhinitis due to animal (cat) (dog) hair and dander: Secondary | ICD-10-CM | POA: Diagnosis not present

## 2022-03-22 NOTE — Telephone Encounter (Signed)
She is willing to try the pressure of 10 for her CPAP. Please advise if ok to change orders?  ?

## 2022-03-25 NOTE — Telephone Encounter (Signed)
DME referral ? ?Change CPAP pressures to 5-10 ?

## 2022-03-26 NOTE — Telephone Encounter (Signed)
Pressure change sent to [facility]. ?Patient is aware and voiced her understanding.  ?Nothing further needed.  ? ?

## 2022-03-27 ENCOUNTER — Telehealth: Payer: Self-pay | Admitting: Pulmonary Disease

## 2022-03-28 DIAGNOSIS — J301 Allergic rhinitis due to pollen: Secondary | ICD-10-CM | POA: Diagnosis not present

## 2022-03-28 DIAGNOSIS — J3081 Allergic rhinitis due to animal (cat) (dog) hair and dander: Secondary | ICD-10-CM | POA: Diagnosis not present

## 2022-03-28 DIAGNOSIS — J3089 Other allergic rhinitis: Secondary | ICD-10-CM | POA: Diagnosis not present

## 2022-03-28 NOTE — Telephone Encounter (Signed)
Called and spoke to pt. Pt states she spoke with an [employer] rep and was informed they do not have the order for her pressure change. Called [facility] and spoke with [facility] and was advise they did not have the fax but the RT can pull the order from Epic. Called pt and informed her of this. Pt verbalized understanding and denied any further questions or concerns at this time.  ? ?

## 2022-04-01 ENCOUNTER — Other Ambulatory Visit: Payer: Self-pay | Admitting: Family Medicine

## 2022-04-01 DIAGNOSIS — G4733 Obstructive sleep apnea (adult) (pediatric): Secondary | ICD-10-CM | POA: Diagnosis not present

## 2022-04-03 ENCOUNTER — Telehealth: Payer: Self-pay | Admitting: Pulmonary Disease

## 2022-04-03 DIAGNOSIS — G4730 Sleep apnea, unspecified: Secondary | ICD-10-CM

## 2022-04-04 NOTE — Telephone Encounter (Signed)
Called and spoke with patient. She stated that she has been trying to use her cpap machine for the past month. She has not been able to get adjusted to it. She feels like she is sleeping even less with the cpap machine. She has tried different masks with no help.  ? ?She wants to know if [facility] would be willing to send an order to [facility] to D/C her cpap.  ? ?AO, can you please advise? Thanks!  ?

## 2022-04-05 NOTE — Telephone Encounter (Signed)
We can send a prescription to the medical supply company to discontinue CPAP per patient request ?

## 2022-04-05 NOTE — Telephone Encounter (Signed)
Called to notify patient that we were discontinuing her CPA per her request. Patient verbalized understanding. Nothing further needed  ?

## 2022-04-09 DIAGNOSIS — J3081 Allergic rhinitis due to animal (cat) (dog) hair and dander: Secondary | ICD-10-CM | POA: Diagnosis not present

## 2022-04-09 DIAGNOSIS — J301 Allergic rhinitis due to pollen: Secondary | ICD-10-CM | POA: Diagnosis not present

## 2022-04-09 DIAGNOSIS — J3089 Other allergic rhinitis: Secondary | ICD-10-CM | POA: Diagnosis not present

## 2022-04-16 DIAGNOSIS — J3089 Other allergic rhinitis: Secondary | ICD-10-CM | POA: Diagnosis not present

## 2022-04-16 DIAGNOSIS — J3081 Allergic rhinitis due to animal (cat) (dog) hair and dander: Secondary | ICD-10-CM | POA: Diagnosis not present

## 2022-04-16 DIAGNOSIS — J301 Allergic rhinitis due to pollen: Secondary | ICD-10-CM | POA: Diagnosis not present

## 2022-04-24 DIAGNOSIS — J3089 Other allergic rhinitis: Secondary | ICD-10-CM | POA: Diagnosis not present

## 2022-04-24 DIAGNOSIS — J3081 Allergic rhinitis due to animal (cat) (dog) hair and dander: Secondary | ICD-10-CM | POA: Diagnosis not present

## 2022-04-24 DIAGNOSIS — J301 Allergic rhinitis due to pollen: Secondary | ICD-10-CM | POA: Diagnosis not present

## 2022-04-29 ENCOUNTER — Ambulatory Visit
Admission: RE | Admit: 2022-04-29 | Discharge: 2022-04-29 | Disposition: A | Discharge status: Home / Self Care | Payer: Medicare HMO | Source: Ambulatory Visit | Attending: Family Medicine | Admitting: Family Medicine

## 2022-04-29 DIAGNOSIS — Z780 Asymptomatic menopausal state: Secondary | ICD-10-CM | POA: Diagnosis not present

## 2022-04-29 DIAGNOSIS — M810 Age-related osteoporosis without current pathological fracture: Secondary | ICD-10-CM

## 2022-04-29 DIAGNOSIS — M8589 Other specified disorders of bone density and structure, multiple sites: Secondary | ICD-10-CM | POA: Diagnosis not present

## 2022-04-30 DIAGNOSIS — J3081 Allergic rhinitis due to animal (cat) (dog) hair and dander: Secondary | ICD-10-CM | POA: Diagnosis not present

## 2022-04-30 DIAGNOSIS — J301 Allergic rhinitis due to pollen: Secondary | ICD-10-CM | POA: Diagnosis not present

## 2022-04-30 DIAGNOSIS — J3089 Other allergic rhinitis: Secondary | ICD-10-CM | POA: Diagnosis not present

## 2022-05-06 ENCOUNTER — Ambulatory Visit (INDEPENDENT_AMBULATORY_CARE_PROVIDER_SITE_OTHER): Payer: Medicare HMO

## 2022-05-06 VITALS — BP 120/74 | HR 76 | Temp 98.0°F | Wt 148.8 lb

## 2022-05-06 DIAGNOSIS — Z0000 Encounter for general adult medical examination without abnormal findings: Secondary | ICD-10-CM | POA: Diagnosis not present

## 2022-05-06 NOTE — Patient Instructions (Signed)
Ms. Henthorn , ?Thank you for taking time to come for your Medicare Wellness Visit. I appreciate your ongoing commitment to your health goals. Please review the following plan we discussed and let me know if I can assist you in the future.  ? ?Screening recommendations/referrals: ?Colonoscopy: Done 10/16/17 repeat every 5 years  ?Mammogram: Done 01/28/22 repeat every year  ?Bone Density: Done 04/29/22 repeat every 2 years  ?Recommended yearly ophthalmology/optometry visit for glaucoma screening and checkup ?Recommended yearly dental visit for hygiene and checkup ? ?Vaccinations: ?Influenza vaccine: Done 02/08/22 repeat every year  ?Pneumococcal vaccine: Up to date ?Tdap vaccine: Done 01/19/16 repeat every 10 years  ?Shingles vaccine: Completed 1/4, 05/04/20   ?Covid-19:Completed 2/8, 3/5, 11/02/20 & 10/31/21 ? ?Advanced directives: Please bring a copy of your health care power of attorney and living will to the office at your convenience. ? ?Conditions/risks identified: Get back to exercise  ? ?Next appointment: Follow up in one year for your annual wellness visit  ? ? ?Preventive Care 73 Years and Older, Female ?Preventive care refers to lifestyle choices and visits with your health care provider that can promote health and wellness. ?What does preventive care include? ?A yearly physical exam. This is also called an annual well check. ?Dental exams once or twice a year. ?Routine eye exams. Ask your health care provider how often you should have your eyes checked. ?Personal lifestyle choices, including: ?Daily care of your teeth and gums. ?Regular physical activity. ?Eating a healthy diet. ?Avoiding tobacco and drug use. ?Limiting alcohol use. ?Practicing safe sex. ?Taking low-dose aspirin every day. ?Taking vitamin and mineral supplements as recommended by your health care provider. ?What happens during an annual well check? ?The services and screenings done by your health care provider during your annual well check will  depend on your age, overall health, lifestyle risk factors, and family history of disease. ?Counseling  ?Your health care provider may ask you questions about your: ?Alcohol use. ?Tobacco use. ?Drug use. ?Emotional well-being. ?Home and relationship well-being. ?Sexual activity. ?Eating habits. ?History of falls. ?Memory and ability to understand (cognition). ?Work and work Statistician. ?Reproductive health. ?Screening  ?You may have the following tests or measurements: ?Height, weight, and BMI. ?Blood pressure. ?Lipid and cholesterol levels. These may be checked every 5 years, or more frequently if you are over 73 years old. ?Skin check. ?Lung cancer screening. You may have this screening every year starting at age 73 if you have a 30-pack-year history of smoking and currently smoke or have quit within the past 15 years. ?Fecal occult blood test (FOBT) of the stool. You may have this test every year starting at age 73. ?Flexible sigmoidoscopy or colonoscopy. You may have a sigmoidoscopy every 5 years or a colonoscopy every 10 years starting at age 73. ?Hepatitis C blood test. ?Hepatitis B blood test. ?Sexually transmitted disease (STD) testing. ?Diabetes screening. This is done by checking your blood sugar (glucose) after you have not eaten for a while (fasting). You may have this done every 1-3 years. ?Bone density scan. This is done to screen for osteoporosis. You may have this done starting at age 73. ?Mammogram. This may be done every 1-2 years. Talk to your health care provider about how often you should have regular mammograms. ?Talk with your health care provider about your test results, treatment options, and if necessary, the need for more tests. ?Vaccines  ?Your health care provider may recommend certain vaccines, such as: ?Influenza vaccine. This is recommended every year. ?Tetanus,  diphtheria, and acellular pertussis (Tdap, Td) vaccine. You may need a Td booster every 10 years. ?Zoster vaccine. You may  need this after age 56. ?Pneumococcal 13-valent conjugate (PCV13) vaccine. One dose is recommended after age 73. ?Pneumococcal polysaccharide (PPSV23) vaccine. One dose is recommended after age 73. ?Talk to your health care provider about which screenings and vaccines you need and how often you need them. ?This information is not intended to replace advice given to you by your health care provider. Make sure you discuss any questions you have with your health care provider. ?Document Released: 01/12/2016 Document Revised: 09/04/2016 Document Reviewed: 10/17/2015 ?Elsevier Interactive Patient Education ? 2017 [place]. ? ?Fall Prevention in the Home ?Falls can cause injuries. They can happen to people of all ages. There are many things you can do to make your home safe and to help prevent falls. ?What can I do on the outside of my home? ?Regularly fix the edges of walkways and driveways and fix any cracks. ?Remove anything that might make you trip as you walk through a door, such as a raised step or threshold. ?Trim any bushes or trees on the path to your home. ?Use bright outdoor lighting. ?Clear any walking paths of anything that might make someone trip, such as rocks or tools. ?Regularly check to see if handrails are loose or broken. Make sure that both sides of any steps have handrails. ?Any raised decks and porches should have guardrails on the edges. ?Have any leaves, snow, or ice cleared regularly. ?Use sand or salt on walking paths during winter. ?Clean up any spills in your garage right away. This includes oil or grease spills. ?What can I do in the bathroom? ?Use night lights. ?Install grab bars by the toilet and in the tub and shower. Do not use towel bars as grab bars. ?Use non-skid mats or decals in the tub or shower. ?If you need to sit down in the shower, use a plastic, non-slip stool. ?Keep the floor dry. Clean up any water that spills on the floor as soon as it happens. ?Remove soap buildup in  the tub or shower regularly. ?Attach bath mats securely with double-sided non-slip rug tape. ?Do not have throw rugs and other things on the floor that can make you trip. ?What can I do in the bedroom? ?Use night lights. ?Make sure that you have a light by your bed that is easy to reach. ?Do not use any sheets or blankets that are too big for your bed. They should not hang down onto the floor. ?Have a firm chair that has side arms. You can use this for support while you get dressed. ?Do not have throw rugs and other things on the floor that can make you trip. ?What can I do in the kitchen? ?Clean up any spills right away. ?Avoid walking on wet floors. ?Keep items that you use a lot in easy-to-reach places. ?If you need to reach something above you, use a strong step stool that has a grab bar. ?Keep electrical cords out of the way. ?Do not use floor polish or wax that makes floors slippery. If you must use wax, use non-skid floor wax. ?Do not have throw rugs and other things on the floor that can make you trip. ?What can I do with my stairs? ?Do not leave any items on the stairs. ?Make sure that there are handrails on both sides of the stairs and use them. Fix handrails that are broken or loose. Make  sure that handrails are as long as the stairways. ?Check any carpeting to make sure that it is firmly attached to the stairs. Fix any carpet that is loose or worn. ?Avoid having throw rugs at the top or bottom of the stairs. If you do have throw rugs, attach them to the floor with carpet tape. ?Make sure that you have a light switch at the top of the stairs and the bottom of the stairs. If you do not have them, ask someone to add them for you. ?What else can I do to help prevent falls? ?Wear shoes that: ?Do not have high heels. ?Have rubber bottoms. ?Are comfortable and fit you well. ?Are closed at the toe. Do not wear sandals. ?If you use a stepladder: ?Make sure that it is fully opened. Do not climb a closed  stepladder. ?Make sure that both sides of the stepladder are locked into place. ?Ask someone to hold it for you, if possible. ?Clearly mark and make sure that you can see: ?Any grab bars or handrails. ?First and last

## 2022-05-06 NOTE — Progress Notes (Signed)
? ?Subjective:  ? Morgan [name] is a 71 y.o. female who presents for Medicare Annual (Subsequent) preventive examination. ? ?Review of Systems    ? ?  ? ?   ?Objective:  ?  ?Today's Vitals  ? 05/06/22 1438  ?BP: 120/74  ?Pulse: 76  ?Temp: 98 ?F (36.7 ?C)  ?SpO2: 98%  ?Weight: 148 lb 12.8 oz (67.5 kg)  ? ?Body mass index is 26.36 kg/m?. ? ? ?  05/06/2022  ?  2:46 PM 11/13/2021  ?  9:15 PM 04/30/2021  ?  2:43 PM 01/04/2021  ? 11:53 AM 01/11/2020  ?  2:33 PM 10/02/2017  ?  2:16 PM 03/16/2015  ?  3:49 PM  ?Advanced Directives  ?Does Patient Have a Medical Advance Directive? Yes Yes Yes Yes Yes No Yes  ?Type of Paramedic of [place];Living will  [place];Living will [place];Living will  [place];Living will  ?Does patient want to make changes to medical advance directive?  No - Patient declined Yes (MAU/Ambulatory/Procedural Areas - Information given) Yes (MAU/Ambulatory/Procedural Areas - Information given) No - Patient declined  Yes - information given  ?Copy of [place] in Chart? No - copy requested No - copy requested   No - copy requested  No - copy requested  ? ? ?Current Medications (verified) ?Outpatient Encounter Medications as of 05/06/2022  ?Medication Sig  ? atorvastatin (LIPITOR) 10 MG tablet TAKE 1 TABLET BY MOUTH EVERY DAY  ? azelastine (ASTELIN) 0.1 % nasal spray Place 2 sprays into both nostrils 2 (two) times daily.  ? diazepam (VALIUM) 5 MG tablet TAKE 1/2 TO 1 TABLET BY MOUTH EVERY 8 HOURS AS NEEDED FOR ANXIETY  ? DULoxetine (CYMBALTA) 30 MG capsule TAKE ONE CAPSULE BY MOUTH ONE TIME DAILY  ? EPINEPHrine 0.3 mg/0.3 mL IJ SOAJ injection See admin instructions.  ? levothyroxine (SYNTHROID) 50 MCG tablet TAKE 1 TABLET (50 MCG TOTAL) BY MOUTH DAILY BEFORE BREAKFAST. EVERY TUESDAY AND THURSDAY  ? levothyroxine (SYNTHROID) 75 MCG tablet TAKE 1 TABLET BY MOUTH EVERY DAY  ?  valACYclovir (VALTREX) 1000 MG tablet TAKE 2 TABLETS BY MOUTH TWICE A DAY ONCE AS NEEDED FOR COLD SORES  ? ?No facility-administered encounter medications on file as of 05/06/2022.  ? ? ?Allergies (verified) ?Simvastatin and Sulfonamide derivatives  ? ?History: ?Past Medical History:  ?Diagnosis Date  ? Allergy   ? Anxiety and depression 02/22/2014  ? Arthritis   ? Depression   ? Diastolic dysfunction   ? Negative Nuclear Stress Test 08/09/12.  ? Elevated cholesterol   ? 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 History of colonic polyps   ? Hypothyroidism   ? Mitral regurgitation   ? Observed sleep apnea 12/03/2021  ? Dr. Ander Slade pulm  ? OSA (obstructive sleep apnea)   ? Mild. No CPAP.  ? Osteoarthritis 06/29/2007  ? 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 dexa 2017  ? 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 Seasonal allergies   ? Sleep apnea   ? mild not on CPAP  ? Tinnitus   ? ?Past Surgical History:  ?Procedure Laterality Date  ? COLONOSCOPY    ? POLYPECTOMY    ? Colon  ? REPLACEMENT TOTAL KNEE Right   ? TOTAL KNEE ARTHROPLASTY Right 04/22/2007  ? TOTAL KNEE ARTHROPLASTY  11/30/2012  ? Procedure: TOTAL KNEE ARTHROPLASTY;  Surgeon: Gearlean Alf, MD;  Location: [facility];  Service: Orthopedics;  Laterality: Left;  ? ?  Family History  ?Problem Relation Age of Onset  ? Heart attack Father   ? Hyperlipidemia Father   ? Heart disease Father   ? Diabetes Father   ? Brain cancer Father   ? Macular degeneration Mother   ? Diabetes Brother   ? Migraines Daughter   ? Pulmonary embolism Daughter   ? Diabetes Paternal Grandfather   ? Arthritis Other   ? Breast cancer Maternal Aunt   ? Breast cancer Paternal Aunt   ? Colon polyps Neg Hx   ? Colon cancer Neg Hx   ? Esophageal cancer Neg Hx   ? Rectal cancer Neg Hx   ? Stomach cancer Neg Hx   ? ?Social History  ? ?Socioeconomic History  ? Marital status: Widowed  ?  Spouse name: Not on file  ? Number of children: Not on file  ? Years of education: Not on file  ? Highest education level: Not on  file  ?Occupational History  ?  Comment: retired   ?Tobacco Use  ? Smoking status: Never  ? Smokeless tobacco: Never  ?Substance and Sexual Activity  ? Alcohol use: Yes  ?  Comment: rare  ? Drug use: No  ? Sexual activity: Yes  ?  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 No major dietary restrictions  ?Other Topics Concern  ? Not on file  ?Social History Narrative  ? Not on file  ? ?Social Determinants of Health  ? ?Financial Resource Strain: Low Risk   ? Difficulty of Paying Living Expenses: Not hard at all  ?Food Insecurity: No Food Insecurity  ? Worried About Charity fundraiser in the Last Year: Never true  ? Ran Out of Food in the Last Year: Never true  ?Transportation Needs: No Transportation Needs  ? Lack of Transportation (Medical): No  ? Lack of Transportation (Non-Medical): No  ?Physical Activity: Inactive  ? Days of Exercise per Week: 0 days  ? Minutes of Exercise per Session: 0 min  ?Stress: Stress Concern Present  ? Feeling of Stress : Rather much  ?Social Connections: Moderately Isolated  ? Frequency of Communication with Friends and Family: More than three times a week  ? Frequency of Social Gatherings with Friends and Family: More than three times a week  ? Attends Religious Services: Never  ? Active Member of Clubs or Organizations: Yes  ? Attends Archivist Meetings: 1 to 4 times per year  ? Marital Status: Widowed  ? ? ?Tobacco Counseling ?Counseling given: Not Answered ? ? ?Clinical Intake: ? ?Pre-visit preparation completed: Yes ? ?Pain : No/denies pain ? ?  ? ?BMI - recorded: 26.36 ?Nutritional Status: BMI 25 -29 Overweight ?Nutritional Risks: None ?Diabetes: No ? ?How often do you need to have someone help you when you read instructions, pamphlets, or other written materials from your doctor or pharmacy?: 1 - Never ? ?Diabetic?no ? ?Interpreter Needed?: No ? ?Information entered by :: Charlott Rakes, LPN ? ? ?Activities of Daily Living ? ?  05/06/2022  ?  2:47  PM  ?In your present state of health, do you have any difficulty performing the following activities:  ?Hearing? 0  ?Comment tinnitus at times  ?Vision? 0  ?Difficulty concentrating or making decisions? 0  ?Walking or climbing stairs? 0  ?Dressing or bathing? 0  ?Doing errands, shopping? 0  ?Preparing Food and eating ? N  ?Using the Toilet? N  ?In the past six months, have you accidently leaked urine?  N  ?Do you have problems with loss of bowel control? N  ?Managing your Medications? N  ?Managing your Finances? N  ?Housekeeping or managing your Housekeeping? N  ? ? ?Patient Care Team: ?Leamon Arnt, MD as PCP - General (Family Medicine) ?Ladene Artist, MD as Consulting Physician (Gastroenterology) ?Harriett Sine, MD as Consulting Physician (Dermatology) ?Dyke Maes, OD as Consulting Physician (Optometry) ?Mosetta Anis, MD as Referring Physician (Allergy) ?Laurin Coder, MD as Consulting Physician (Pulmonary Disease) ? ?Indicate any recent Medical Services you may have received from other than [facility] providers in the past year (date may be approximate). ? ?   ?Assessment:  ? This is a routine wellness examination for [facility]. ? ?Hearing/Vision screen ?Hearing Screening - Comments:: Pt denies any hearing issues  ?Vision Screening - Comments:: Pt follow up with [place] [facility] for annual eye exams  ? ?Dietary issues and exercise activities discussed: ?Current Exercise Habits: The patient does not participate in regular exercise at present ? ? Goals Addressed   ? ?  ?  ?  ?  ? This Visit's Progress  ?  Patient Stated     ?  Get in more exercise  ?  ? ?  ? ?Depression Screen ? ?  05/06/2022  ?  2:43 PM 11/13/2021  ? 11:22 AM 04/30/2021  ?  2:41 PM 04/30/2021  ?  2:00 PM 03/01/2021  ? 11:24 AM 09/25/2020  ?  3:08 PM 09/06/2020  ?  2:58 PM  ?PHQ 2/9 Scores  ?PHQ - 2 Score '1 1 1 2 2 2 6  '$ ?PHQ- 9 Score  '2  3 8 9 17  '$ ?  ?Fall Risk ? ?  05/06/2022  ?  2:47 PM 04/30/2021  ?  2:45 PM 04/30/2021  ?  2:00 PM 01/11/2020  ?  2:49 PM  11/12/2019  ?  3:46 PM  ?Fall Risk   ?Falls in the past year? 0 1 0 0 1  ?Number falls in past yr: 0 1 0 0 0  ?Injury with Fall? 0 1 0 0 0  ?Comment  skinned face at christmas     ?Risk for fall due to : Impair

## 2022-05-09 DIAGNOSIS — J3089 Other allergic rhinitis: Secondary | ICD-10-CM | POA: Diagnosis not present

## 2022-05-09 DIAGNOSIS — J3081 Allergic rhinitis due to animal (cat) (dog) hair and dander: Secondary | ICD-10-CM | POA: Diagnosis not present

## 2022-05-09 DIAGNOSIS — J301 Allergic rhinitis due to pollen: Secondary | ICD-10-CM | POA: Diagnosis not present

## 2022-05-17 DIAGNOSIS — J3089 Other allergic rhinitis: Secondary | ICD-10-CM | POA: Diagnosis not present

## 2022-05-17 DIAGNOSIS — J301 Allergic rhinitis due to pollen: Secondary | ICD-10-CM | POA: Diagnosis not present

## 2022-05-17 DIAGNOSIS — J3081 Allergic rhinitis due to animal (cat) (dog) hair and dander: Secondary | ICD-10-CM | POA: Diagnosis not present

## 2022-05-24 DIAGNOSIS — J3089 Other allergic rhinitis: Secondary | ICD-10-CM | POA: Diagnosis not present

## 2022-05-24 DIAGNOSIS — J3081 Allergic rhinitis due to animal (cat) (dog) hair and dander: Secondary | ICD-10-CM | POA: Diagnosis not present

## 2022-05-24 DIAGNOSIS — J301 Allergic rhinitis due to pollen: Secondary | ICD-10-CM | POA: Diagnosis not present

## 2022-06-06 DIAGNOSIS — J3081 Allergic rhinitis due to animal (cat) (dog) hair and dander: Secondary | ICD-10-CM | POA: Diagnosis not present

## 2022-06-06 DIAGNOSIS — J3089 Other allergic rhinitis: Secondary | ICD-10-CM | POA: Diagnosis not present

## 2022-06-06 DIAGNOSIS — J301 Allergic rhinitis due to pollen: Secondary | ICD-10-CM | POA: Diagnosis not present

## 2022-06-10 DIAGNOSIS — J301 Allergic rhinitis due to pollen: Secondary | ICD-10-CM | POA: Diagnosis not present

## 2022-06-10 DIAGNOSIS — J3089 Other allergic rhinitis: Secondary | ICD-10-CM | POA: Diagnosis not present

## 2022-06-10 DIAGNOSIS — J3081 Allergic rhinitis due to animal (cat) (dog) hair and dander: Secondary | ICD-10-CM | POA: Diagnosis not present

## 2022-06-18 DIAGNOSIS — J301 Allergic rhinitis due to pollen: Secondary | ICD-10-CM | POA: Diagnosis not present

## 2022-06-18 DIAGNOSIS — J3081 Allergic rhinitis due to animal (cat) (dog) hair and dander: Secondary | ICD-10-CM | POA: Diagnosis not present

## 2022-06-18 DIAGNOSIS — J3089 Other allergic rhinitis: Secondary | ICD-10-CM | POA: Diagnosis not present

## 2022-06-25 DIAGNOSIS — J3089 Other allergic rhinitis: Secondary | ICD-10-CM | POA: Diagnosis not present

## 2022-06-25 DIAGNOSIS — J3081 Allergic rhinitis due to animal (cat) (dog) hair and dander: Secondary | ICD-10-CM | POA: Diagnosis not present

## 2022-06-25 DIAGNOSIS — J301 Allergic rhinitis due to pollen: Secondary | ICD-10-CM | POA: Diagnosis not present

## 2022-06-27 ENCOUNTER — Ambulatory Visit (INDEPENDENT_AMBULATORY_CARE_PROVIDER_SITE_OTHER): Payer: Medicare HMO | Admitting: Family Medicine

## 2022-06-27 ENCOUNTER — Encounter: Payer: Self-pay | Admitting: Family Medicine

## 2022-06-27 VITALS — HR 66 | Temp 97.8°F | Ht 63.0 in | Wt 148.0 lb

## 2022-06-27 DIAGNOSIS — G4733 Obstructive sleep apnea (adult) (pediatric): Secondary | ICD-10-CM | POA: Diagnosis not present

## 2022-06-27 DIAGNOSIS — Z0000 Encounter for general adult medical examination without abnormal findings: Secondary | ICD-10-CM | POA: Diagnosis not present

## 2022-06-27 DIAGNOSIS — E782 Mixed hyperlipidemia: Secondary | ICD-10-CM

## 2022-06-27 DIAGNOSIS — F339 Major depressive disorder, recurrent, unspecified: Secondary | ICD-10-CM | POA: Diagnosis not present

## 2022-06-27 DIAGNOSIS — K635 Polyp of colon: Secondary | ICD-10-CM

## 2022-06-27 DIAGNOSIS — E039 Hypothyroidism, unspecified: Secondary | ICD-10-CM | POA: Diagnosis not present

## 2022-06-27 DIAGNOSIS — R69 Illness, unspecified: Secondary | ICD-10-CM | POA: Diagnosis not present

## 2022-06-27 LAB — CBC WITH DIFFERENTIAL/PLATELET
Basophils Absolute: 0.1 10*3/uL (ref 0.0–0.1)
Basophils Relative: 0.8 % (ref 0.0–3.0)
Eosinophils Absolute: 0.4 10*3/uL (ref 0.0–0.7)
Eosinophils Relative: 6.5 % — ABNORMAL HIGH (ref 0.0–5.0)
HCT: 37.3 % (ref 36.0–46.0)
Hemoglobin: 12.2 g/dL (ref 12.0–15.0)
Lymphocytes Relative: 33.3 % (ref 12.0–46.0)
Lymphs Abs: 2.2 10*3/uL (ref 0.7–4.0)
MCHC: 32.7 g/dL (ref 30.0–36.0)
MCV: 93.7 fl (ref 78.0–100.0)
Monocytes Absolute: 0.4 10*3/uL (ref 0.1–1.0)
Monocytes Relative: 6.1 % (ref 3.0–12.0)
Neutro Abs: 3.6 10*3/uL (ref 1.4–7.7)
Neutrophils Relative %: 53.3 % (ref 43.0–77.0)
Platelets: 260 10*3/uL (ref 150.0–400.0)
RBC: 3.98 Mil/uL (ref 3.87–5.11)
RDW: 13.9 % (ref 11.5–15.5)
WBC: 6.7 10*3/uL (ref 4.0–10.5)

## 2022-06-27 LAB — COMPREHENSIVE METABOLIC PANEL
ALT: 13 U/L (ref 0–35)
AST: 20 U/L (ref 0–37)
Albumin: 4.4 g/dL (ref 3.5–5.2)
Alkaline Phosphatase: 80 U/L (ref 39–117)
BUN: 18 mg/dL (ref 6–23)
CO2: 26 mEq/L (ref 19–32)
Calcium: 9.5 mg/dL (ref 8.4–10.5)
Chloride: 105 mEq/L (ref 96–112)
Creatinine, Ser: 0.84 mg/dL (ref 0.40–1.20)
GFR: 69.13 mL/min (ref >60.00)
Glucose, Bld: 98 mg/dL (ref 70–99)
Potassium: 3.9 mEq/L (ref 3.5–5.1)
Sodium: 140 mEq/L (ref 135–145)
Total Bilirubin: 0.6 mg/dL (ref 0.2–1.2)
Total Protein: 7.1 g/dL (ref 6.0–8.3)

## 2022-06-27 LAB — LIPID PANEL
Cholesterol: 188 mg/dL (ref 0–200)
HDL: 65.8 mg/dL (ref >39.00)
LDL Cholesterol: 108 mg/dL — ABNORMAL HIGH (ref 0–99)
NonHDL: 122.38
Total CHOL/HDL Ratio: 3
Triglycerides: 72 mg/dL (ref 0.0–149.0)
VLDL: 14.4 mg/dL (ref 0.0–40.0)

## 2022-06-27 LAB — TSH: TSH: 1.33 u[IU]/mL (ref 0.35–5.50)

## 2022-06-27 MED ORDER — DULOXETINE HCL 60 MG PO CPEP: 60.0000 mg | ORAL_CAPSULE | Freq: Every day | ORAL | 3 refills | Status: DC

## 2022-06-27 MED ORDER — LEVOTHYROXINE SODIUM 75 MCG PO TABS: 75.0000 ug | ORAL_TABLET | ORAL | 3 refills | Status: DC

## 2022-06-27 MED ORDER — LEVOTHYROXINE SODIUM 50 MCG PO TABS: 50.0000 ug | ORAL_TABLET | Freq: Every day | ORAL | 11 refills | Status: DC

## 2022-06-27 NOTE — Progress Notes (Signed)
Subjective  Chief Complaint  Patient presents with   Annual Exam    Pt here for Annual exam and is currently fasting. Pt stated that she has been feeling very fatigue lately and some hot flashes    HPI: Morgan [name] is a 71 y.o. female who presents to [facility] today for a Female Wellness Visit. She also has the concerns and/or needs as listed above in the chief complaint. These will be addressed in addition to the Health Maintenance Visit.   Wellness Visit: annual visit with health maintenance review and exam without Pap  HM: mammo is current. Due for 5 yr surveillance colonoscopy in October. She has called GI and will get scheduled. F/u polyps. Imms are utd.   Chronic disease f/u and/or acute problem visit: (deemed necessary to be done in addition to the wellness visit): Active depression: negative sxs: low mood, low energy, low motivation. On cymbalta 30; started about 20 years ago. Has failed prozac, wellbutrin (jittery) and higher dose cymbalta in past. 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 No SI.  OSA failed cpap. Reviewed pulm notes HLD on statin. Fasting for recheck  Low thyroid on mixed dose levothyroxine. Was at goal 6 mo ago   Assessment  1. Annual physical exam   2. Obstructive sleep apnea   3. Mixed hyperlipidemia   4. Acquired hypothyroidism   5. Major depression, recurrent, chronic ([place])   6. Polyp of colon, unspecified part of colon, unspecified type      Plan  Female Wellness Visit: Age appropriate Health Maintenance and Prevention measures were discussed with patient. Included topics are cancer screening recommendations, ways to keep healthy (see AVS) including dietary and exercise recommendations, regular eye and dental care, use of seat belts, and avoidance of moderate alcohol use and tobacco use. Pt to  schedule colonoscopy BMI: discussed patient's BMI and encouraged positive lifestyle modifications to help get to or maintain a target BMI. HM needs and immunizations were addressed and ordered. See below for orders. See HM and immunization section for updates. Routine labs and screening tests ordered including cmp, cbc and lipids where appropriate. Discussed recommendations regarding Vit D and calcium supplementation (see AVS)  Chronic disease management visit and/or acute problem visit: Recheck lipids on atorvastatin 10 nighlty.  Recheck thyroid on levothx 79mg T and Th and 743m all other days.  Depression: counseled at length. Stress has worsened her chronic depression/dysthymia. Discussed multiple treatment options: elect trying to increase cymbalta dose again. Ordered 6044maily. Recheck 6 weeks. Consider psych referral, [place]MS[place] retrial or adjuvant with atypical antipsychotic.   Follow up: 6 weeks to recheck mood  Orders Placed This Encounter  Procedures   CBC with Differential/Platelet   Comp Met (CMET)   Lipid Profile   TSH   Meds ordered this encounter  Medications   levothyroxine (SYNTHROID) 75 MCG tablet    Sig: Take 1 tablet (75 mcg total) by mouth as directed. Every day except Tuesday and Thursday    Dispense:  90 tablet    Refill:  3   levothyroxine (SYNTHROID) 50 MCG tablet    Sig: Take 1 tablet (50 mcg total) by mouth daily. Every Tuesday and Thursday    Dispense:  30 tablet    Refill:  11   DULoxetine (CYMBALTA) 60 MG capsule    Sig: Take 1 capsule (60 mg  total) by mouth daily.    Dispense:  90 capsule    Refill:  3      Body mass index is 26.22 kg/m. Wt Readings from Last 3 Encounters:  06/27/22 148 lb (67.1 kg)  05/06/22 148 lb 12.8 oz (67.5 kg)  11/13/21 150 lb (68 kg)     Patient Active Problem List   Diagnosis Date Noted   Mixed hyperlipidemia 07/14/2007    Priority: High   Acquired hypothyroidism 06/29/2007    Priority: High    Obstructive sleep apnea 12/03/2021    Priority: Medium     Dr. Ander Slade pulm, sleep study 10/2021. Did not tolerate CPAP     Major depression, recurrent, chronic ([place]) 09/25/2020    Priority: Medium    IFG (impaired fasting glucose) 05/19/2020    Priority: Medium    Osteoporosis, post-menopausal 11/12/2019    Priority: Medium     Dexa 11/2019: T = - 2.6 right femur, worse than prior dexa 2017; recommend starting treatment. Pt deferred.  Dexa 04/2022: lowest T - -2.3, improved. 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    Priority: Low   Mild mitral regurgitation by prior echocardiogram 07/24/2010    Priority: Low   Allergic rhinitis 06/29/2007    Priority: [place] Maintenance  Topic Date Due   COVID-19 Vaccine (5 - Booster for [place] series) 07/13/2022 (Originally 12/26/2021)   INFLUENZA VACCINE  07/30/2022   COLONOSCOPY (Pts 45-64yr Insurance coverage will need to be confirmed)  10/16/2022   MAMMOGRAM  01/28/2023   DEXA SCAN  04/29/2024   TETANUS/TDAP  01/18/2026   Pneumonia Vaccine 71 Years old  Completed   Hepatitis C Screening  Completed   Zoster Vaccines- Shingrix  Completed   HPV VACCINES  Aged Out   Immunization History  Administered Date(s) Administered   Fluad Quad(high Dose 65+) 09/03/2019, 09/17/2020   Influenza Split 11/29/2011, 10/23/2012   Influenza Whole 09/30/2008, 11/10/2010   Influenza, High Dose Seasonal PF 09/15/2017, 09/14/2018, 09/15/2021, 10/03/2021, 02/08/2022   Influenza,inj,Quad PF,6+ Mos 10/01/2013, 09/13/2014, 10/03/2015, 10/30/2016   PFIZER(Purple Top)SARS-COV-2 Vaccination 02/07/2020, 03/03/2020, 11/02/2020,  10/03/2021   Pfizer Covid-19 Vaccine Bivalent Booster 128yr& up 10/31/2021   Pneumococcal Conjugate-13 10/22/2013   Pneumococcal Polysaccharide-23 03/16/2015, 10/03/2021, 02/08/2022   Td 12/30/2005, 01/19/2016   Zoster Recombinat (Shingrix) 01/03/2020, 05/04/2020   Zoster, Live 08/02/2011   We updated and reviewed the patient's past history in detail and it is documented below. Allergies: Patient is allergic to simvastatin and sulfonamide derivatives. Past Medical History Patient  has a past medical history of Allergy, Anxiety and depression (02/22/2014), Arthritis, Depression, Diastolic dysfunction, Elevated cholesterol, Grover's disease (11/12/2019), History of colonic polyps, Hypothyroidism, Mitral regurgitation, Observed sleep apnea (12/03/2021), OSA (obstructive sleep apnea), Osteoarthritis (06/29/2007), Osteoporosis, post-menopausal (11/12/2019), Recurrent cold sores (11/12/2019), Seasonal allergies, Sleep apnea, and Tinnitus. 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 Family History: Patient family history includes Arthritis in an other family member; Brain cancer in her father; Breast  cancer in her maternal aunt and paternal aunt; Diabetes in her brother, father, and paternal grandfather; Heart attack in her father; Heart disease in her father; Hyperlipidemia in her father; Macular degeneration in her mother; Migraines in her daughter; Pulmonary embolism in her daughter. Social History:  Patient  reports that she has never smoked. She has never used smokeless tobacco. She reports current alcohol use. She reports that she does not use drugs.  Review of Systems: Constitutional: negative for fever or malaise Ophthalmic: negative for photophobia, double vision or loss of vision Cardiovascular: negative for chest pain,  dyspnea on exertion, or new LE swelling Respiratory: negative for SOB or persistent cough Gastrointestinal: negative for abdominal pain, change in bowel habits or melena Genitourinary: negative for dysuria or gross hematuria, no abnormal uterine bleeding or disharge Musculoskeletal: negative for new gait disturbance or muscular weakness Integumentary: negative for new or persistent rashes, no breast lumps Neurological: negative for TIA or stroke symptoms Psychiatric: negative for SI or delusions Allergic/Immunologic: negative for hives  Patient Care Team    Relationship Specialty Notifications Start End  Leamon Arnt, MD PCP - General Family Medicine  11/12/19   Ladene Artist, MD Consulting Physician Gastroenterology  03/15/15   Harriett Sine, MD Consulting Physician Dermatology  01/11/20   Dyke Maes, [place] Physician Optometry  01/11/20   Mosetta Anis, MD Referring Physician Allergy  11/13/21   Laurin Coder, MD Consulting Physician Pulmonary Disease  12/03/21     Objective  Vitals: Pulse 66   Temp 97.8 F (36.6 C)   Ht '5\' 3"'  (1.6 m)   Wt 148 lb (67.1 kg)   SpO2 99%   BMI 26.22 kg/m  General:  Well developed, well nourished, no acute distress  Psych:  Alert and orientedx3,flat mood and affect, fair insight HEENT:  Normocephalic, atraumatic, non-icteric sclera,  supple neck without adenopathy, mass or thyromegaly Cardiovascular:  Normal S1, S2, RRR without gallop, rub or murmur Respiratory:  Good breath sounds bilaterally, CTAB with normal respiratory effort Gastrointestinal: normal bowel sounds, soft, non-tender, no noted masses. No HSM MSK: no deformities, contusions. Joints are without erythema or swelling.  Skin:  Warm, no rashes or suspicious lesions noted Neurologic:    Mental status is normal. CN 2-11 are normal. Gross motor and sensory exams are normal. Normal gait. + tremor   Commons side effects, risks, benefits, and alternatives for  medications and treatment plan prescribed today were discussed, and the patient expressed understanding of the given instructions. Patient is instructed to call or message via MyChart if he/she has any questions or concerns regarding our treatment plan. No barriers to understanding were identified. We discussed Red Flag symptoms and signs in detail. Patient expressed understanding regarding what to do in case of urgent or emergency type symptoms.  Medication list was reconciled, printed and provided to the patient in AVS. Patient instructions and summary information was reviewed with the patient as documented in the AVS. This note was prepared with assistance of Dragon voice recognition software. Occasional wrong-word or sound-a-like substitutions may have occurred due to the inherent limitations of voice recognition software  This visit occurred during the SARS-CoV-2 public health emergency.  Safety protocols were in place, including screening questions prior to the visit, additional usage of staff PPE, and extensive cleaning of exam room while observing appropriate contact time as indicated for disinfecting solutions.

## 2022-06-27 NOTE — Patient Instructions (Addendum)
Please return in 12 months for your annual complete physical; please come fasting.   I will release your lab results to you on your MyChart account with further instructions. You may see the results before I do, but when I review them I will send you a message with my report or have my assistant call you if things need to be discussed. Please reply to my message with any questions. Thank you!   If you have any questions or concerns, please don't hesitate to send me a message via MyChart or call the office at [phone]. Thank you for visiting with [place] today! It's our pleasure caring for you.   Psychiatrists  [place] [address] [place], [place] [address] [phone]  [place], NP [address], [place], [place] [address] [place], [place] [address] [phone]  [facility] Pauline Good, NP Magda Paganini NP [id number] [place], [place] [address] [phone]  Counseling centers only:  [facility] [address] [place], [place] [facility]: [facility] [address] [place] [address] [place], [place] [phone]   [facility] [address] [place], [place] [address] [place], [place] [address] [place]  [facility] [address] [place],  [address] [phone]

## 2022-07-03 ENCOUNTER — Other Ambulatory Visit: Payer: Self-pay | Admitting: Family Medicine

## 2022-07-11 DIAGNOSIS — J3089 Other allergic rhinitis: Secondary | ICD-10-CM | POA: Diagnosis not present

## 2022-07-11 DIAGNOSIS — J301 Allergic rhinitis due to pollen: Secondary | ICD-10-CM | POA: Diagnosis not present

## 2022-07-11 DIAGNOSIS — J3081 Allergic rhinitis due to animal (cat) (dog) hair and dander: Secondary | ICD-10-CM | POA: Diagnosis not present

## 2022-07-16 DIAGNOSIS — J3089 Other allergic rhinitis: Secondary | ICD-10-CM | POA: Diagnosis not present

## 2022-07-16 DIAGNOSIS — J3081 Allergic rhinitis due to animal (cat) (dog) hair and dander: Secondary | ICD-10-CM | POA: Diagnosis not present

## 2022-07-16 DIAGNOSIS — J301 Allergic rhinitis due to pollen: Secondary | ICD-10-CM | POA: Diagnosis not present

## 2022-07-24 NOTE — Telephone Encounter (Signed)
Error

## 2022-07-25 DIAGNOSIS — J301 Allergic rhinitis due to pollen: Secondary | ICD-10-CM | POA: Diagnosis not present

## 2022-07-25 DIAGNOSIS — J3081 Allergic rhinitis due to animal (cat) (dog) hair and dander: Secondary | ICD-10-CM | POA: Diagnosis not present

## 2022-07-25 DIAGNOSIS — J3089 Other allergic rhinitis: Secondary | ICD-10-CM | POA: Diagnosis not present

## 2022-07-29 ENCOUNTER — Telehealth: Payer: Self-pay | Admitting: Family Medicine

## 2022-07-29 NOTE — Telephone Encounter (Signed)
Patient states she has been taking Cymbalta 60 mg daily for a month but has seemingly been feeling worse. Patient is requesting to go back to cymbalta 30 mg and receive a new refill of this dosage.   .. Encourage patient to contact the pharmacy for refills or they can request refills through [facility]: 06/27/22  NEXT APPOINTMENT DATE: 05/19/2023  MEDICATION: DULoxetine (CYMBALTA) 30 MG capsule  Is the patient out of medication? Yes  PHARMACY: [facility] [address], [place], [place] [address]  Phone:  [phone]  Fax:  [phone]  DEA #:  --  Let patient know to contact pharmacy at the end of the day to make sure medication is ready.  Please notify patient to allow 48-72 hours to process

## 2022-07-30 ENCOUNTER — Other Ambulatory Visit: Payer: Self-pay

## 2022-07-30 MED ORDER — DULOXETINE HCL 30 MG PO CPEP: 30.0000 mg | ORAL_CAPSULE | Freq: Every day | ORAL | 3 refills | Status: DC

## 2022-07-30 NOTE — Telephone Encounter (Signed)
Pt has been informed of new change

## 2022-08-02 DIAGNOSIS — J301 Allergic rhinitis due to pollen: Secondary | ICD-10-CM | POA: Diagnosis not present

## 2022-08-02 DIAGNOSIS — J3089 Other allergic rhinitis: Secondary | ICD-10-CM | POA: Diagnosis not present

## 2022-08-02 DIAGNOSIS — J3081 Allergic rhinitis due to animal (cat) (dog) hair and dander: Secondary | ICD-10-CM | POA: Diagnosis not present

## 2022-08-13 ENCOUNTER — Encounter: Payer: Self-pay | Admitting: Family Medicine

## 2022-08-13 ENCOUNTER — Ambulatory Visit (INDEPENDENT_AMBULATORY_CARE_PROVIDER_SITE_OTHER): Payer: Medicare HMO | Admitting: Family Medicine

## 2022-08-13 VITALS — BP 152/70 | HR 69 | Temp 98.6°F | Ht 63.0 in | Wt 150.8 lb

## 2022-08-13 DIAGNOSIS — G4709 Other insomnia: Secondary | ICD-10-CM

## 2022-08-13 DIAGNOSIS — J301 Allergic rhinitis due to pollen: Secondary | ICD-10-CM | POA: Diagnosis not present

## 2022-08-13 DIAGNOSIS — J3089 Other allergic rhinitis: Secondary | ICD-10-CM | POA: Diagnosis not present

## 2022-08-13 DIAGNOSIS — J3081 Allergic rhinitis due to animal (cat) (dog) hair and dander: Secondary | ICD-10-CM | POA: Diagnosis not present

## 2022-08-13 DIAGNOSIS — R030 Elevated blood-pressure reading, without diagnosis of hypertension: Secondary | ICD-10-CM

## 2022-08-13 DIAGNOSIS — F339 Major depressive disorder, recurrent, unspecified: Secondary | ICD-10-CM

## 2022-08-13 DIAGNOSIS — R69 Illness, unspecified: Secondary | ICD-10-CM | POA: Diagnosis not present

## 2022-08-13 MED ORDER — QUETIAPINE FUMARATE 25 MG PO TABS: 25.0000 mg | ORAL_TABLET | Freq: Every day | ORAL | 5 refills | Status: DC

## 2022-08-13 NOTE — Progress Notes (Signed)
Subjective  CC:  Chief Complaint  Patient presents with   Trouble Sleeping    Pt stated that she has been having trouble for the past month with trouble sleeping. Has been taking Melatonin but it has been leaving her drowsy like its hard for her to wake up    HPI: Morgan [name] is a 71 y.o. female who presents to the office today to address the problems listed above in the chief complaint. Having more problems with sleep. Falls asleep ok but then awakens and can't fall back to sleep. Mind is awake ... Always thinking. Feeling more tired during the day now. Melatonin is helpful but feel groggy. Failed cpap has osa.  Mood: depression remains active but not worsening. Did not tolerate the cymbalta '60mg'$  so remains on cymbalta '30mg'$ . Feels "off" ... Hard for her to explain. Low motivation.  Worried about her family who were just diagnosed with covid ... No h/o HTN.   Assessment  1. Major depression, recurrent, chronic ([place])   2. Secondary insomnia   3. Elevated blood pressure reading without diagnosis of hypertension      Plan  Depression and secondary insomnia:  counseling done. Discussed options of tx including belsomra, trazadone or seroquel. We elect seroquel for depression adjuvant and sleep. Close f/u recommended.  Will recheck bp at next visit. No h/o HTN  Follow up: Return in about 8 weeks (around 10/08/2022) for mood follow up.  10/07/2022  No orders of the defined types were placed in this encounter.  Meds ordered this encounter  Medications   QUEtiapine (SEROQUEL) 25 MG tablet    Sig: Take 1 tablet (25 mg total) by mouth at bedtime.    Dispense:  30 tablet    Refill:  5      I reviewed the patients updated PMH, FH, and SocHx.    Patient Active Problem List   Diagnosis Date Noted   Mixed hyperlipidemia 07/14/2007    Priority: High   Acquired hypothyroidism 06/29/2007    Priority: High   Obstructive sleep apnea 12/03/2021    Priority: Medium    Major  depression, recurrent, chronic (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   diazepam (VALIUM) 5 MG tablet TAKE 1/2 TO 1 TABLET BY MOUTH EVERY 8 HOURS AS NEEDED FOR ANXIETY   DULoxetine (CYMBALTA) 30 MG capsule Take 1 capsule (30 mg total) by mouth daily.   EPINEPHrine 0.3 mg/0.3 mL IJ SOAJ injection See admin instructions.   levothyroxine (SYNTHROID) 50 MCG tablet Take 1 tablet (50 mcg  total) by mouth daily. Every Tuesday and Thursday   levothyroxine (SYNTHROID) 75 MCG tablet Take 1 tablet (75 mcg total) by mouth as directed. Every day except Tuesday and Thursday   QUEtiapine (SEROQUEL) 25 MG tablet Take 1 tablet (25 mg total) by mouth at bedtime.   valACYclovir (VALTREX) 1000 MG tablet TAKE 2 TABLETS BY MOUTH TWICE A DAY ONCE AS NEEDED FOR COLD SORES    Allergies: Patient is allergic to simvastatin and sulfonamide derivatives. Family History: Patient family history includes Arthritis in an other family member; Brain cancer in her father; Breast cancer in her maternal aunt and paternal aunt; Diabetes in her brother, father, and paternal grandfather; Heart attack in her  father; Heart disease in her father; Hyperlipidemia in her father; Macular degeneration in her mother; Migraines in her daughter; Pulmonary embolism in her daughter. Social History:  Patient  reports that she has never smoked. She has never used smokeless tobacco. She reports current alcohol use. She reports that she does not use drugs.  Review of Systems: Constitutional: Negative for fever malaise or anorexia Cardiovascular: negative for chest pain Respiratory: negative for SOB or persistent cough Gastrointestinal: negative for abdominal pain  Objective  Vitals: BP (!) 152/70   Pulse 69   Temp 98.6 F (37 C)   Ht '5\' 3"'$  (1.6 m)   Wt 150 lb 12.8 oz (68.4 kg)   SpO2 96%   BMI 26.71 kg/m  General: no acute distress , A&Ox3 Flat affect. Fair insight, normal speech   Commons side effects, risks, benefits, and alternatives for medications and treatment plan prescribed today were discussed, and the patient expressed understanding of the given instructions. Patient is instructed to call or message via MyChart if he/she has any questions or concerns regarding our treatment plan. No barriers to understanding were identified. We discussed Red Flag symptoms and signs in detail. Patient expressed understanding regarding what to do in case of urgent or emergency type symptoms.  Medication list was reconciled, printed and provided to the patient in AVS. Patient instructions and summary information was reviewed with the patient as documented in the AVS. This note was prepared with assistance of Dragon voice recognition software. Occasional wrong-word or sound-a-like substitutions may have occurred due to the inherent limitations of voice recognition software  This visit occurred during the SARS-CoV-2 public health emergency.  Safety protocols were in place, including screening questions prior to the visit, additional usage of staff PPE, and extensive cleaning of exam room while observing appropriate  contact time as indicated for disinfecting solutions.

## 2022-08-13 NOTE — Patient Instructions (Signed)
Please return in 6-8 weeks to recheck sleep and mood.   You may increase the seroquel to '50mg'$  nightly in 2-4 weeks if needed.  If you have any questions or concerns, please don't hesitate to send me a message via MyChart or call the office at [phone]. Thank you for visiting with [place] today! It's our pleasure caring for you.

## 2022-08-16 ENCOUNTER — Other Ambulatory Visit: Payer: Self-pay | Admitting: Family Medicine

## 2022-08-16 DIAGNOSIS — F5102 Adjustment insomnia: Secondary | ICD-10-CM

## 2022-08-20 DIAGNOSIS — J3089 Other allergic rhinitis: Secondary | ICD-10-CM | POA: Diagnosis not present

## 2022-08-20 DIAGNOSIS — J3081 Allergic rhinitis due to animal (cat) (dog) hair and dander: Secondary | ICD-10-CM | POA: Diagnosis not present

## 2022-08-20 DIAGNOSIS — J301 Allergic rhinitis due to pollen: Secondary | ICD-10-CM | POA: Diagnosis not present

## 2022-08-30 DIAGNOSIS — J3081 Allergic rhinitis due to animal (cat) (dog) hair and dander: Secondary | ICD-10-CM | POA: Diagnosis not present

## 2022-08-30 DIAGNOSIS — J301 Allergic rhinitis due to pollen: Secondary | ICD-10-CM | POA: Diagnosis not present

## 2022-08-30 DIAGNOSIS — J3089 Other allergic rhinitis: Secondary | ICD-10-CM | POA: Diagnosis not present

## 2022-09-06 DIAGNOSIS — J3089 Other allergic rhinitis: Secondary | ICD-10-CM | POA: Diagnosis not present

## 2022-09-06 DIAGNOSIS — J301 Allergic rhinitis due to pollen: Secondary | ICD-10-CM | POA: Diagnosis not present

## 2022-09-06 DIAGNOSIS — J3081 Allergic rhinitis due to animal (cat) (dog) hair and dander: Secondary | ICD-10-CM | POA: Diagnosis not present

## 2022-09-12 DIAGNOSIS — J3089 Other allergic rhinitis: Secondary | ICD-10-CM | POA: Diagnosis not present

## 2022-09-12 DIAGNOSIS — J3081 Allergic rhinitis due to animal (cat) (dog) hair and dander: Secondary | ICD-10-CM | POA: Diagnosis not present

## 2022-09-12 DIAGNOSIS — J301 Allergic rhinitis due to pollen: Secondary | ICD-10-CM | POA: Diagnosis not present

## 2022-09-14 ENCOUNTER — Other Ambulatory Visit: Payer: Self-pay | Admitting: Family Medicine

## 2022-09-16 DIAGNOSIS — J301 Allergic rhinitis due to pollen: Secondary | ICD-10-CM | POA: Diagnosis not present

## 2022-09-16 DIAGNOSIS — J3081 Allergic rhinitis due to animal (cat) (dog) hair and dander: Secondary | ICD-10-CM | POA: Diagnosis not present

## 2022-09-16 DIAGNOSIS — J3089 Other allergic rhinitis: Secondary | ICD-10-CM | POA: Diagnosis not present

## 2022-09-23 ENCOUNTER — Telehealth: Payer: Self-pay | Admitting: Family Medicine

## 2022-09-23 ENCOUNTER — Other Ambulatory Visit: Payer: Self-pay

## 2022-09-23 MED ORDER — DULOXETINE HCL 30 MG PO CPEP: 30.0000 mg | ORAL_CAPSULE | Freq: Every day | ORAL | 1 refills | Status: DC

## 2022-09-23 NOTE — Telephone Encounter (Signed)
Patient requests a 64 Day Supply RX for the following medication with refills to be sent to [facility] located in [facility] on [address] in [place]:  .[name] Encourage patient to contact the pharmacy for refills or they can request refills through [facility]:  Please schedule appointment if longer than 1 year 08/13/22  NEXT APPOINTMENT DATE:  MEDICATION:   90 Day Supply DULoxetine (CYMBALTA) 30 MG capsule  Is the patient out of medication? No-approx 3 days left  PHARMACY:  [facility] in [place]  Let patient know to contact pharmacy at the end of the day to make sure medication is ready.  Please notify patient to allow 48-72 hours to process

## 2022-09-23 NOTE — Telephone Encounter (Signed)
Rx sent 

## 2022-09-26 DIAGNOSIS — J3081 Allergic rhinitis due to animal (cat) (dog) hair and dander: Secondary | ICD-10-CM | POA: Diagnosis not present

## 2022-09-26 DIAGNOSIS — J3089 Other allergic rhinitis: Secondary | ICD-10-CM | POA: Diagnosis not present

## 2022-09-26 DIAGNOSIS — J301 Allergic rhinitis due to pollen: Secondary | ICD-10-CM | POA: Diagnosis not present

## 2022-10-05 ENCOUNTER — Other Ambulatory Visit: Payer: Self-pay | Admitting: Family Medicine

## 2022-10-05 DIAGNOSIS — M545 Low back pain, unspecified: Secondary | ICD-10-CM | POA: Diagnosis not present

## 2022-10-05 DIAGNOSIS — N390 Urinary tract infection, site not specified: Secondary | ICD-10-CM | POA: Diagnosis not present

## 2022-10-07 ENCOUNTER — Ambulatory Visit: Payer: Medicare HMO | Admitting: Family Medicine

## 2022-10-16 DIAGNOSIS — R059 Cough, unspecified: Secondary | ICD-10-CM | POA: Diagnosis not present

## 2022-10-16 DIAGNOSIS — J019 Acute sinusitis, unspecified: Secondary | ICD-10-CM | POA: Diagnosis not present

## 2022-10-16 DIAGNOSIS — Z20822 Contact with and (suspected) exposure to covid-19: Secondary | ICD-10-CM | POA: Diagnosis not present

## 2022-10-16 DIAGNOSIS — R519 Headache, unspecified: Secondary | ICD-10-CM | POA: Diagnosis not present

## 2022-10-21 DIAGNOSIS — J3081 Allergic rhinitis due to animal (cat) (dog) hair and dander: Secondary | ICD-10-CM | POA: Diagnosis not present

## 2022-10-21 DIAGNOSIS — J3089 Other allergic rhinitis: Secondary | ICD-10-CM | POA: Diagnosis not present

## 2022-10-21 DIAGNOSIS — J301 Allergic rhinitis due to pollen: Secondary | ICD-10-CM | POA: Diagnosis not present

## 2022-10-29 ENCOUNTER — Encounter: Payer: Self-pay | Admitting: Gastroenterology

## 2022-10-29 DIAGNOSIS — J3089 Other allergic rhinitis: Secondary | ICD-10-CM | POA: Diagnosis not present

## 2022-10-29 DIAGNOSIS — J301 Allergic rhinitis due to pollen: Secondary | ICD-10-CM | POA: Diagnosis not present

## 2022-10-29 DIAGNOSIS — J3081 Allergic rhinitis due to animal (cat) (dog) hair and dander: Secondary | ICD-10-CM | POA: Diagnosis not present

## 2022-10-30 DIAGNOSIS — R109 Unspecified abdominal pain: Secondary | ICD-10-CM | POA: Diagnosis not present

## 2022-10-30 DIAGNOSIS — C801 Malignant (primary) neoplasm, unspecified: Secondary | ICD-10-CM

## 2022-10-30 HISTORY — DX: Malignant (primary) neoplasm, unspecified: C80.1

## 2022-11-05 DIAGNOSIS — J3089 Other allergic rhinitis: Secondary | ICD-10-CM | POA: Diagnosis not present

## 2022-11-05 DIAGNOSIS — J301 Allergic rhinitis due to pollen: Secondary | ICD-10-CM | POA: Diagnosis not present

## 2022-11-05 DIAGNOSIS — J3081 Allergic rhinitis due to animal (cat) (dog) hair and dander: Secondary | ICD-10-CM | POA: Diagnosis not present

## 2022-11-06 DIAGNOSIS — R350 Frequency of micturition: Secondary | ICD-10-CM | POA: Diagnosis not present

## 2022-11-06 DIAGNOSIS — R1031 Right lower quadrant pain: Secondary | ICD-10-CM | POA: Diagnosis not present

## 2022-11-11 DIAGNOSIS — J3081 Allergic rhinitis due to animal (cat) (dog) hair and dander: Secondary | ICD-10-CM | POA: Diagnosis not present

## 2022-11-11 DIAGNOSIS — J301 Allergic rhinitis due to pollen: Secondary | ICD-10-CM | POA: Diagnosis not present

## 2022-11-11 DIAGNOSIS — J3089 Other allergic rhinitis: Secondary | ICD-10-CM | POA: Diagnosis not present

## 2022-11-12 ENCOUNTER — Ambulatory Visit (AMBULATORY_SURGERY_CENTER): Payer: Self-pay | Admitting: *Deleted

## 2022-11-12 VITALS — Ht 63.0 in | Wt 149.4 lb

## 2022-11-12 DIAGNOSIS — Z860100 Personal history of colon polyps, unspecified: Secondary | ICD-10-CM

## 2022-11-12 MED ORDER — NA SULFATE-K SULFATE-MG SULF 17.5-3.13-1.6 GM/177ML PO SOLN: 1.0000 | Freq: Once | ORAL | 0 refills | Status: AC

## 2022-11-12 NOTE — Progress Notes (Signed)

## 2022-11-13 DIAGNOSIS — R1031 Right lower quadrant pain: Secondary | ICD-10-CM | POA: Diagnosis not present

## 2022-11-18 DIAGNOSIS — J3081 Allergic rhinitis due to animal (cat) (dog) hair and dander: Secondary | ICD-10-CM | POA: Diagnosis not present

## 2022-11-18 DIAGNOSIS — J301 Allergic rhinitis due to pollen: Secondary | ICD-10-CM | POA: Diagnosis not present

## 2022-11-18 DIAGNOSIS — J3089 Other allergic rhinitis: Secondary | ICD-10-CM | POA: Diagnosis not present

## 2022-11-25 DIAGNOSIS — J3081 Allergic rhinitis due to animal (cat) (dog) hair and dander: Secondary | ICD-10-CM | POA: Diagnosis not present

## 2022-11-25 DIAGNOSIS — J301 Allergic rhinitis due to pollen: Secondary | ICD-10-CM | POA: Diagnosis not present

## 2022-11-25 DIAGNOSIS — J3089 Other allergic rhinitis: Secondary | ICD-10-CM | POA: Diagnosis not present

## 2022-11-27 DIAGNOSIS — D49511 Neoplasm of unspecified behavior of right kidney: Secondary | ICD-10-CM | POA: Diagnosis not present

## 2022-11-29 DIAGNOSIS — R300 Dysuria: Secondary | ICD-10-CM | POA: Diagnosis not present

## 2022-11-29 DIAGNOSIS — D49511 Neoplasm of unspecified behavior of right kidney: Secondary | ICD-10-CM | POA: Diagnosis not present

## 2022-12-02 ENCOUNTER — Other Ambulatory Visit: Payer: Self-pay | Admitting: Urology

## 2022-12-03 DIAGNOSIS — J3089 Other allergic rhinitis: Secondary | ICD-10-CM | POA: Diagnosis not present

## 2022-12-03 DIAGNOSIS — J301 Allergic rhinitis due to pollen: Secondary | ICD-10-CM | POA: Diagnosis not present

## 2022-12-03 DIAGNOSIS — J3081 Allergic rhinitis due to animal (cat) (dog) hair and dander: Secondary | ICD-10-CM | POA: Diagnosis not present

## 2022-12-05 ENCOUNTER — Encounter: Payer: Self-pay | Admitting: Family Medicine

## 2022-12-05 ENCOUNTER — Ambulatory Visit (INDEPENDENT_AMBULATORY_CARE_PROVIDER_SITE_OTHER): Payer: Medicare HMO | Admitting: Family Medicine

## 2022-12-05 VITALS — BP 166/72 | HR 63 | Temp 98.2°F | Ht 63.0 in | Wt 149.0 lb

## 2022-12-05 DIAGNOSIS — F339 Major depressive disorder, recurrent, unspecified: Secondary | ICD-10-CM

## 2022-12-05 DIAGNOSIS — I10 Essential (primary) hypertension: Secondary | ICD-10-CM | POA: Diagnosis not present

## 2022-12-05 DIAGNOSIS — G4709 Other insomnia: Secondary | ICD-10-CM

## 2022-12-05 DIAGNOSIS — C641 Malignant neoplasm of right kidney, except renal pelvis: Secondary | ICD-10-CM

## 2022-12-05 DIAGNOSIS — R69 Illness, unspecified: Secondary | ICD-10-CM | POA: Diagnosis not present

## 2022-12-05 MED ORDER — LISINOPRIL-HYDROCHLOROTHIAZIDE 10-12.5 MG PO TABS: 1.0000 | ORAL_TABLET | Freq: Every day | ORAL | 3 refills | Status: DC

## 2022-12-05 NOTE — Patient Instructions (Signed)
Please follow up if symptoms do not improve or as needed.    Please let me know if your blood pressure is not normalizing with the new blood pressure pill. It can be checked at the urology office.  If it is remaining high, then schedule a visit for me to recheck and adjust things.   Good luck with your surgery.   Hypertension, Adult High blood pressure (hypertension) is when the force of blood pumping through the arteries is too strong. The arteries are the blood vessels that carry blood from the heart throughout the body. Hypertension forces the heart to work harder to pump blood and may cause arteries to become narrow or stiff. Untreated or uncontrolled hypertension can lead to a heart attack, heart failure, a stroke, kidney disease, and other problems. A blood pressure reading consists of a higher number over a lower number. Ideally, your blood pressure should be below 120/80. The first ("top") number is called the systolic pressure. It is a measure of the pressure in your arteries as your heart beats. The second ("bottom") number is called the diastolic pressure. It is a measure of the pressure in your arteries as the heart relaxes. What are the causes? The exact cause of this condition is not known. There are some conditions that result in high blood pressure. What increases the risk? Certain factors may make you more likely to develop high blood pressure. Some of these risk factors are under your control, including: Smoking. Not getting enough exercise or physical activity. Being overweight. Having too much fat, sugar, calories, or salt (sodium) in your diet. Drinking too much alcohol. Other risk factors include: Having a personal history of heart disease, diabetes, high cholesterol, or kidney disease. Stress. Having a family history of high blood pressure and high cholesterol. Having obstructive sleep apnea. Age. The risk increases with age. What are the signs or symptoms? High blood  pressure may not cause symptoms. Very high blood pressure (hypertensive crisis) may cause: Headache. Fast or irregular heartbeats (palpitations). Shortness of breath. Nosebleed. Nausea and vomiting. Vision changes. Severe chest pain, dizziness, and seizures. How is this diagnosed? This condition is diagnosed by measuring your blood pressure while you are seated, with your arm resting on a flat surface, your legs uncrossed, and your feet flat on the floor. The cuff of the blood pressure monitor will be placed directly against the skin of your upper arm at the level of your heart. Blood pressure should be measured at least twice using the same arm. Certain conditions can cause a difference in blood pressure between your right and left arms. If you have a high blood pressure reading during one visit or you have normal blood pressure with other risk factors, you may be asked to: Return on a different day to have your blood pressure checked again. Monitor your blood pressure at home for 1 week or longer. If you are diagnosed with hypertension, you may have other blood or imaging tests to help your health care provider understand your overall risk for other conditions. How is this treated? This condition is treated by making healthy lifestyle changes, such as eating healthy foods, exercising more, and reducing your alcohol intake. You may be referred for counseling on a healthy diet and physical activity. Your health care provider may prescribe medicine if lifestyle changes are not enough to get your blood pressure under control and if: Your systolic blood pressure is above 130. Your diastolic blood pressure is above 80. Your personal target  blood pressure may vary depending on your medical conditions, your age, and other factors. Follow these instructions at home: Eating and drinking  Eat a diet that is high in fiber and potassium, and low in sodium, added sugar, and fat. An example of this eating  plan is called the DASH diet. DASH stands for Dietary Approaches to Stop Hypertension. To eat this way: Eat plenty of fresh fruits and vegetables. Try to fill one half of your plate at each meal with fruits and vegetables. Eat whole grains, such as whole-wheat pasta, brown rice, or whole-grain bread. Fill about one fourth of your plate with whole grains. Eat or drink low-fat dairy products, such as skim milk or low-fat yogurt. Avoid fatty cuts of meat, processed or cured meats, and poultry with skin. Fill about one fourth of your plate with lean proteins, such as fish, chicken without skin, beans, eggs, or tofu. Avoid pre-made and processed foods. These tend to be higher in sodium, added sugar, and fat. Reduce your daily sodium intake. Many people with hypertension should eat less than 1,500 mg of sodium a day. Do not drink alcohol if: Your health care provider tells you not to drink. You are pregnant, may be pregnant, or are planning to become pregnant. If you drink alcohol: Limit how much you have to: 0-1 drink a day for women. 0-2 drinks a day for men. Know how much alcohol is in your drink. In the [place] one drink equals one 12 oz bottle of beer (355 mL), one 5 oz glass of wine (148 mL), or one 1 oz glass of hard liquor (44 mL). Lifestyle  Work with your health care provider to maintain a healthy body weight or to lose weight. Ask what an ideal weight is for you. Get at least 30 minutes of exercise that causes your heart to beat faster (aerobic exercise) most days of the week. Activities may include walking, swimming, or biking. Include exercise to strengthen your muscles (resistance exercise), such as Pilates or lifting weights, as part of your weekly exercise routine. Try to do these types of exercises for 30 minutes at least 3 days a week. Do not use any products that contain nicotine or tobacco. These products include cigarettes, chewing tobacco, and vaping devices, such as e-cigarettes.  If you need help quitting, ask your health care provider. Monitor your blood pressure at home as told by your health care provider. Keep all follow-up visits. This is important. Medicines Take over-the-counter and prescription medicines only as told by your health care provider. Follow directions carefully. Blood pressure medicines must be taken as prescribed. Do not skip doses of blood pressure medicine. Doing this puts you at risk for problems and can make the medicine less effective. Ask your health care provider about side effects or reactions to medicines that you should watch for. Contact a health care provider if you: Think you are having a reaction to a medicine you are taking. Have headaches that keep coming back (recurring). Feel dizzy. Have swelling in your ankles. Have trouble with your vision. Get help right away if you: Develop a severe headache or confusion. Have unusual weakness or numbness. Feel faint. Have severe pain in your chest or abdomen. Vomit repeatedly. Have trouble breathing. These symptoms may be an emergency. Get help right away. Call 911. Do not wait to see if the symptoms will go away. Do not drive yourself to the hospital. Summary Hypertension is when the force of blood pumping through your arteries is  too strong. If this condition is not controlled, it may put you at risk for serious complications. Your personal target blood pressure may vary depending on your medical conditions, your age, and other factors. For most people, a normal blood pressure is less than 120/80. Hypertension is treated with lifestyle changes, medicines, or a combination of both. Lifestyle changes include losing weight, eating a healthy, low-sodium diet, exercising more, and limiting alcohol. This information is not intended to replace advice given to you by your health care provider. Make sure you discuss any questions you have with your health care provider. Document Revised:  10/23/2021 Document Reviewed: 10/23/2021 Elsevier Patient Education  [place].

## 2022-12-05 NOTE — Progress Notes (Signed)
Subjective  CC:  Chief Complaint  Patient presents with   Blood Pressure Check    Pt here for Bp check for surgery clearance for 12/31/2021    HPI: Morgan [name] is a 71 y.o. female who presents to the office today to address the problems listed above in the chief complaint. Elevated bp: First noted in August.  Patient was to return for recheck but became dizzy due to new renal neoplasm.  See below.  Blood pressures have remained elevated outside the office at urology.  Home readings also around 160s over 90s.  She denies chest pain or shortness of breath.  This is a new problem. Presumed renal cell carcinoma of the right kidney: I reviewed urology notes from the last several months.  Seen in urgent care for low back pain no urinary symptoms.  Due to possible recurrent UTIs she was referred to urology.  Lower abdominal pain.  Resulted in abdominal CT which showed a large renal mass on the right.  She since has had a cystoscopy and is scheduled for right nephrectomy in January.  She feels fatigued.  She believes this is secondary to being presumed neoplasm.  She will still have a chest CT for staging. Insomnia and depression: Both remain active.  She will use Seroquel.  Uses melatonin as needed.  Continues on Cymbalta 30. Lab Results  Component Value Date   CREATININE 0.84 06/27/2022   BUN 18 06/27/2022   NA 140 06/27/2022   K 3.9 06/27/2022   CL 105 06/27/2022   CO2 26 06/27/2022    Assessment  1. Essential hypertension   2. 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 Major depression, recurrent, chronic ([place])   4. Secondary insomnia      Plan  HTN:  confirmed. May be related to renal mass; will start low dose ace/hctz and monitor.  Recommend low-salt diet.  She has follow-up in 2 weeks with urology.  They will recheck her blood pressure at that time.  She will have preoperative labs drawn at that time as well including a renal panel and potassium levels.   She will follow-up with me blood pressures are not improving or if she has any problems with her medications.  Would like her to be normotensive prior to her surgery. Renal cell carcinoma: Full right nephrectomy.  I will follow along.  Reviewed multiple notes and CT scans. Depression and secondary insomnia: Persist but patient is hopeful that she will feel better after her surgery.  Continue Cymbalta 30 mg daily  Follow up:  as scheduled. Sooner if needed for blood pressure control.   05/19/2023  No orders of the defined types were placed in this encounter.  Meds ordered this encounter  Medications   lisinopril-hydrochlorothiazide (ZESTORETIC) 10-12.5 MG tablet    Sig: Take 1 tablet by mouth daily.    Dispense:  90 tablet    Refill:  3      I reviewed the patients updated PMH, FH, and SocHx.    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/12/2019    Priority: Medium  Colon polyps 11/12/2019    Priority: Medium    GAD (generalized anxiety disorder) 02/22/2014    Priority: Medium    Diastolic dysfunction 61/95/0932    Priority: Medium    Osteoarthritis 06/29/2007    Priority: Medium    Essential tremor 04/30/2021    Priority: Low   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   Coenzyme Q10 (COQ-10 PO) Take by mouth daily. Take one daily   diazepam (VALIUM) 5 MG tablet TAKE 1/2 TO 1 TABLET BY MOUTH EVERY 8 HOURS AS NEEDED FOR ANXIETY   DULoxetine (CYMBALTA) 30 MG capsule Take 1 capsule (30 mg total) by mouth daily.   EPINEPHrine 0.3 mg/0.3 mL IJ SOAJ injection See admin instructions.   levothyroxine (SYNTHROID) 50 MCG tablet TAKE 1 TABLET (50 MCG TOTAL) BY MOUTH DAILY BEFORE BREAKFAST. EVERY TUESDAY AND THURSDAY   levothyroxine (SYNTHROID) 75 MCG tablet Take 1 tablet (75 mcg total) by mouth as directed. Every day except Tuesday and Thursday   lisinopril-hydrochlorothiazide (ZESTORETIC) 10-12.5 MG tablet Take 1 tablet by mouth daily.   Multiple Vitamins-Minerals (MULTIVITAMIN ADULTS 50+ PO) Take by mouth daily. Women's centrum   tiZANidine (ZANAFLEX) 4 MG tablet Take 4 mg by mouth every 8 (eight) hours as needed.   valACYclovir (VALTREX) 1000 MG tablet TAKE 2 TABLETS BY MOUTH TWICE A DAY ONCE AS NEEDED FOR COLD SORES   [DISCONTINUED] QUEtiapine (SEROQUEL) 25 MG tablet Take 1 tablet (25 mg total) by mouth at bedtime.    Allergies: Patient is allergic to simvastatin and sulfonamide derivatives. Family History: Patient family history includes Arthritis in an other family member; Brain cancer in her father; Breast cancer in her maternal aunt and paternal aunt; Diabetes in her brother, father, and paternal grandfather; Heart attack in her father; Heart disease in her father; Hyperlipidemia in her father; Macular degeneration in her mother; Migraines in her daughter; Pulmonary embolism in her daughter. Social History:  Patient  reports that she has never smoked. She has never been exposed to tobacco smoke. She has never used smokeless tobacco. She reports current alcohol use. She reports that she does not use drugs.  Review of Systems: Constitutional: Negative for fever malaise or anorexia Cardiovascular: negative for chest pain Respiratory:  negative for SOB or persistent cough Gastrointestinal: negative for abdominal pain  Objective  Vitals: BP (!) 166/72 Comment: right arm seated, by me cla  Pulse 63   Temp 98.2 F (36.8 C)   Ht '5\' 3"'$  (1.6 m)   Wt 149 lb (67.6 kg)   SpO2 99%   BMI 26.39 kg/m  General: no acute distress , A&Ox3 Flat affect HEENT: PEERL, conjunctiva normal, neck is supple Cardiovascular:  RRR without murmur or gallop.  Respiratory:  Good breath sounds bilaterally, CTAB with normal respiratory effort Skin:  Warm, no rashes    Commons side effects, risks, benefits, and alternatives for medications and treatment plan prescribed today were discussed, and the patient expressed understanding of the given instructions. Patient is instructed to call or message via MyChart if he/she has any questions or  concerns regarding our treatment plan. No barriers to understanding were identified. We discussed Red Flag symptoms and signs in detail. Patient expressed understanding regarding what to do in case of urgent or emergency type symptoms.  Medication list was reconciled, printed and provided to the patient in AVS. Patient instructions and summary information was reviewed with the patient as documented in the AVS. This note was prepared with assistance of Dragon voice recognition software. Occasional wrong-word or sound-a-like substitutions may have occurred due to the inherent limitations of voice recognition software  This visit occurred during the SARS-CoV-2 public health emergency.  Safety protocols were in place, including screening questions prior to the visit, additional usage of staff PPE, and extensive cleaning of exam room while observing appropriate contact time as indicated for disinfecting solutions.

## 2022-12-06 ENCOUNTER — Encounter: Payer: Self-pay | Admitting: Gastroenterology

## 2022-12-11 ENCOUNTER — Ambulatory Visit (AMBULATORY_SURGERY_CENTER): Payer: Medicare HMO | Admitting: Gastroenterology

## 2022-12-11 ENCOUNTER — Encounter: Payer: Self-pay | Admitting: Gastroenterology

## 2022-12-11 VITALS — BP 111/57 | HR 61 | Temp 95.3°F | Resp 14 | Ht 63.0 in | Wt 149.4 lb

## 2022-12-11 DIAGNOSIS — D128 Benign neoplasm of rectum: Secondary | ICD-10-CM

## 2022-12-11 DIAGNOSIS — Z860101 Personal history of adenomatous and serrated colon polyps: Secondary | ICD-10-CM

## 2022-12-11 DIAGNOSIS — Z86010 Personal history of colonic polyps: Secondary | ICD-10-CM

## 2022-12-11 DIAGNOSIS — Z09 Encounter for follow-up examination after completed treatment for conditions other than malignant neoplasm: Secondary | ICD-10-CM | POA: Diagnosis not present

## 2022-12-11 DIAGNOSIS — K635 Polyp of colon: Secondary | ICD-10-CM | POA: Diagnosis not present

## 2022-12-11 DIAGNOSIS — D124 Benign neoplasm of descending colon: Secondary | ICD-10-CM

## 2022-12-11 DIAGNOSIS — K621 Rectal polyp: Secondary | ICD-10-CM | POA: Diagnosis not present

## 2022-12-11 DIAGNOSIS — D125 Benign neoplasm of sigmoid colon: Secondary | ICD-10-CM

## 2022-12-11 MED ORDER — SODIUM CHLORIDE 0.9 % IV SOLN: 500.0000 mL | Freq: Once | INTRAVENOUS | Status: DC

## 2022-12-11 NOTE — Progress Notes (Signed)
See 12/05/2022 H&P, no changes

## 2022-12-11 NOTE — Patient Instructions (Signed)
Handouts on polyps and hemorrhoids given to patient. Await pathology results. Resume previous diet and continue present medications. Repeat colonoscopy for surveillance will be determined based off of pathology results.   YOU HAD AN ENDOSCOPIC PROCEDURE TODAY AT THE [facility]:   Refer to the procedure report that was given to you for any specific questions about what was found during the examination.  If the procedure report does not answer your questions, please call your gastroenterologist to clarify.  If you requested that your care partner not be given the details of your procedure findings, then the procedure report has been included in a sealed envelope for you to review at your convenience later.  YOU SHOULD EXPECT: Some feelings of bloating in the abdomen. Passage of more gas than usual.  Walking can help get rid of the air that was put into your GI tract during the procedure and reduce the bloating. If you had a lower endoscopy (such as a colonoscopy or flexible sigmoidoscopy) you may notice spotting of blood in your stool or on the toilet paper. If you underwent a bowel prep for your procedure, you may not have a normal bowel movement for a few days.  Please Note:  You might notice some irritation and congestion in your nose or some drainage.  This is from the oxygen used during your procedure.  There is no need for concern and it should clear up in a day or so.  SYMPTOMS TO REPORT IMMEDIATELY:  Following lower endoscopy (colonoscopy or flexible sigmoidoscopy):  Excessive amounts of blood in the stool  Significant tenderness or worsening of abdominal pains  Swelling of the abdomen that is new, acute  Fever of 100F or higher  For urgent or emergent issues, a gastroenterologist can be reached at any hour by calling [phone]. Do not use MyChart messaging for urgent concerns.    DIET:  We do recommend a small meal at first, but then you may proceed to your regular  diet.  Drink plenty of fluids but you should avoid alcoholic beverages for 24 hours.  ACTIVITY:  You should plan to take it easy for the rest of today and you should NOT DRIVE or use heavy machinery until tomorrow (because of the sedation medicines used during the test).    FOLLOW UP: Our staff will call the number listed on your records the next business day following your procedure.  We will call around 7:15- 8:00 am to check on you and address any questions or concerns that you may have regarding the information given to you following your procedure. If we do not reach you, we will leave a message.     If any biopsies were taken you will be contacted by phone or by letter within the next 1-3 weeks.  Please call us at [phone] if you have not heard about the biopsies in 3 weeks.    SIGNATURES/CONFIDENTIALITY: You and/or your care partner have signed paperwork which will be entered into your electronic medical record.  These signatures attest to the fact that that the information above on your After Visit Summary has been reviewed and is understood.  Full responsibility of the confidentiality of this discharge information lies with you and/or your care-partner. 

## 2022-12-11 NOTE — Op Note (Signed)
[facility] Patient Name: Morgan [name] Procedure Date: 12/11/2022 8:09 AM MRN: [id number] Endoscopist: Ladene Artist , MD, [id number] Age: 71 Referring MD:  Date of Birth: Jun 28, 1951 Gender: Female Account #: [id number] Procedure:                Colonoscopy Indications:              Surveillance: Personal history of adenomatous                            polyps on last colonoscopy 5 years ago Medicines:                Monitored Anesthesia Care Procedure:                Pre-Anesthesia Assessment:                           - Prior to the procedure, a History and Physical                            was performed, and patient medications and                            allergies were reviewed. The patient's tolerance of                            previous anesthesia was also reviewed. The risks                            and benefits of the procedure and the sedation                            options and risks were discussed with the patient.                            All questions were answered, and informed consent                            was obtained. Prior Anticoagulants: The patient has                            taken no anticoagulant or antiplatelet agents. ASA                            Grade Assessment: II - A patient with mild systemic                            disease. After reviewing the risks and benefits,                            the patient was deemed in satisfactory condition to                            undergo the procedure.  After obtaining informed consent, the colonoscope                            was passed under direct vision. Throughout the                            procedure, the patient's blood pressure, pulse, and                            oxygen saturations were monitored continuously. The                            Olympus CF-HQ190L [id number]) Colonoscope was                            introduced through the anus  and advanced to the the                            cecum, identified by appendiceal orifice and                            ileocecal valve. The ileocecal valve, appendiceal                            orifice, and rectum were photographed. The quality                            of the bowel preparation was good. The colonoscopy                            was performed without difficulty. The patient                            tolerated the procedure well. Scope In: 8:15:11 AM Scope Out: 8:34:52 AM Scope Withdrawal Time: 0 hours 15 minutes 4 seconds  Total Procedure Duration: 0 hours 19 minutes 41 seconds  Findings:                 The perianal and digital rectal examinations were                            normal.                           Four sessile polyps were found in the rectum (1),                            sigmoid colon (1) and descending colon (2). The                            polyps were 6 to 8 mm in size. These polyps were                            removed with a cold snare. Resection and retrieval  were complete.                           Internal hemorrhoids were found during                            retroflexion. The hemorrhoids were small and Grade                            I (internal hemorrhoids that do not prolapse).                           The exam was otherwise without abnormality on                            direct and retroflexion views. Complications:            No immediate complications. Estimated blood loss:                            None. Estimated Blood Loss:     Estimated blood loss: none. Impression:               - Four 6 to 8 mm polyps in the rectum, in the                            sigmoid colon and in the descending colon, removed                            with a cold snare. Resected and retrieved.                           - Internal hemorrhoids.                           - The examination was otherwise normal on  direct                            and retroflexion views. Recommendation:           - Repeat colonoscopy after studies are complete for                            surveillance based on pathology results.                           - Patient has a contact number available for                            emergencies. The signs and symptoms of potential                            delayed complications were discussed with the                            patient. Return to normal activities tomorrow.  Written discharge instructions were provided to the                            patient.                           - Resume previous diet.                           - Continue present medications.                           - Await pathology results. Ladene Artist, MD 12/11/2022 8:39:50 AM This report has been signed electronically.

## 2022-12-11 NOTE — Progress Notes (Signed)
Called to room to assist during endoscopic procedure.  Patient ID and intended procedure confirmed with present staff. Received instructions for my participation in the procedure from the performing physician.  

## 2022-12-11 NOTE — Progress Notes (Signed)
Pt's states no medical or surgical changes since previsit or office visit.  Patient states she has been diagnosed with right kidney cancer and will have surgery on Jan 2 to remove the kidney.

## 2022-12-11 NOTE — Progress Notes (Signed)
A and O x3. Report to RN. Tolerated MAC anesthesia well. 

## 2022-12-12 ENCOUNTER — Telehealth: Payer: Self-pay | Admitting: *Deleted

## 2022-12-12 DIAGNOSIS — C641 Malignant neoplasm of right kidney, except renal pelvis: Secondary | ICD-10-CM | POA: Diagnosis not present

## 2022-12-12 DIAGNOSIS — J3089 Other allergic rhinitis: Secondary | ICD-10-CM | POA: Diagnosis not present

## 2022-12-12 DIAGNOSIS — J301 Allergic rhinitis due to pollen: Secondary | ICD-10-CM | POA: Diagnosis not present

## 2022-12-12 DIAGNOSIS — J3081 Allergic rhinitis due to animal (cat) (dog) hair and dander: Secondary | ICD-10-CM | POA: Diagnosis not present

## 2022-12-12 DIAGNOSIS — D49511 Neoplasm of unspecified behavior of right kidney: Secondary | ICD-10-CM | POA: Diagnosis not present

## 2022-12-12 NOTE — Telephone Encounter (Signed)
  Follow up Call-     12/11/2022    7:39 AM  Call back number  Post procedure Call Back phone  # [phone]  Permission to leave phone message Yes     Patient questions:  Do you have a fever, pain , or abdominal swelling? No. Pain Score  0 *  Have you tolerated food without any problems? Yes.    Have you been able to return to your normal activities? Yes.    Do you have any questions about your discharge instructions: Diet   No. Medications  No. Follow up visit  No.  Do you have questions or concerns about your Care? No.  Actions: * If pain score is 4 or above: No action needed, pain <4.

## 2022-12-15 ENCOUNTER — Other Ambulatory Visit: Payer: Self-pay | Admitting: Family Medicine

## 2022-12-16 ENCOUNTER — Encounter: Payer: Self-pay | Admitting: Family Medicine

## 2022-12-17 DIAGNOSIS — J3089 Other allergic rhinitis: Secondary | ICD-10-CM | POA: Diagnosis not present

## 2022-12-17 DIAGNOSIS — J301 Allergic rhinitis due to pollen: Secondary | ICD-10-CM | POA: Diagnosis not present

## 2022-12-17 DIAGNOSIS — J3081 Allergic rhinitis due to animal (cat) (dog) hair and dander: Secondary | ICD-10-CM | POA: Diagnosis not present

## 2022-12-17 DIAGNOSIS — D49511 Neoplasm of unspecified behavior of right kidney: Secondary | ICD-10-CM | POA: Diagnosis not present

## 2022-12-19 NOTE — Patient Instructions (Signed)
DUE TO COVID-19 ONLY TWO VISITORS  (aged 73 and older)  ARE ALLOWED TO COME WITH YOU AND STAY IN THE WAITING ROOM ONLY DURING PRE OP AND PROCEDURE.   **NO VISITORS ARE ALLOWED IN THE SHORT STAY AREA OR RECOVERY ROOM!!**  IF YOU WILL BE ADMITTED INTO THE HOSPITAL YOU ARE ALLOWED ONLY FOUR SUPPORT PEOPLE DURING VISITATION HOURS ONLY (7 AM -8PM)   The support person(s) must pass our screening, gel in and out, and wear a mask at all times, including in the patient's room. Patients must also wear a mask when staff or their support person are in the room. Visitors GUEST BADGE MUST BE WORN VISIBLY  One adult visitor may remain with you overnight and MUST be in the room by 8 P.M.     Your procedure is scheduled on: 12/31/21   Report to [facility] Main Entrance    Report to admitting at : 5:15 AM   Call this number if you have problems the morning of surgery [phone]   Clear liquids starting the day before surgery until : 4:30 AM DAY OF SURGERY  Water Black Coffee (sugar ok, NO MILK/CREAM OR CREAMERS)  Tea (sugar ok, NO MILK/CREAM OR CREAMERS) regular and decaf                             Plain Jell-O (NO RED)                                           Fruit ices (not with fruit pulp, NO RED)                                     Popsicles (NO RED)                                                                  Juice: apple, WHITE grape, WHITE cranberry Sports drinks like Gatorade (NO RED)              FOLLOW BOWEL PREP AND ANY ADDITIONAL PRE OP INSTRUCTIONS YOU RECEIVED FROM YOUR SURGEON'S OFFICE!!!   Oral Hygiene is also important to reduce your risk of infection.                                    Remember - BRUSH YOUR TEETH THE MORNING OF SURGERY WITH YOUR REGULAR TOOTHPASTE  DENTURES WILL BE REMOVED PRIOR TO SURGERY PLEASE DO NOT APPLY "Poly grip" OR ADHESIVES!!!   Do NOT smoke after Midnight   Take these medicines the morning of surgery with A SIP OF WATER:  duloxetine,levothyroxine.  DO NOT TAKE ANY ORAL DIABETIC MEDICATIONS DAY OF YOUR SURGERY  Bring CPAP mask and tubing day of surgery.                              You may not have any metal on your body including hair  pins, jewelry, and body piercing             Do not wear make-up, lotions, powders, perfumes/cologne, or deodorant  Do not wear nail polish including gel and S&S, artificial/acrylic nails, or any other type of covering on natural nails including finger and toenails. If you have artificial nails, gel coating, etc. that needs to be removed by a nail salon please have this removed prior to surgery or surgery may need to be canceled/ delayed if the surgeon/ anesthesia feels like they are unable to be safely monitored.   Do not shave  48 hours prior to surgery.    Do not bring valuables to the hospital. [place].   Contacts, glasses, or bridgework may not be worn into surgery.   Bring small overnight bag day of surgery.   DO NOT [place]. PHARMACY WILL DISPENSE MEDICATIONS LISTED ON YOUR MEDICATION LIST TO YOU DURING YOUR ADMISSION [place]!    Patients discharged on the day of surgery will not be allowed to drive home.  Someone NEEDS to stay with you for the first 24 hours after anesthesia.   Special Instructions: Bring a copy of your healthcare power of attorney and living will documents         the day of surgery if you haven't scanned them before.              Please read over the following fact sheets you were given: IF YOU HAVE QUESTIONS ABOUT YOUR PRE-OP INSTRUCTIONS PLEASE CALL [phone]    [facility] - Preparing for Surgery Before surgery, you can play an important role.  Because skin is not sterile, your skin needs to be as free of germs as possible.  You can reduce the number of germs on your skin by washing with CHG (chlorahexidine gluconate) soap before surgery.  CHG is  an antiseptic cleaner which kills germs and bonds with the skin to continue killing germs even after washing. Please DO NOT use if you have an allergy to CHG or antibacterial soaps.  If your skin becomes reddened/irritated stop using the CHG and inform your nurse when you arrive at Short Stay. Do not shave (including legs and underarms) for at least 48 hours prior to the first CHG shower.  You may shave your face/neck. Please follow these instructions carefully:  1.  Shower with CHG Soap the night before surgery and the  morning of Surgery.  2.  If you choose to wash your hair, wash your hair first as usual with your  normal  shampoo.  3.  After you shampoo, rinse your hair and body thoroughly to remove the  shampoo.                           4.  Use CHG as you would any other liquid soap.  You can apply chg directly  to the skin and wash                       Gently with a scrungie or clean washcloth.  5.  Apply the CHG Soap to your body ONLY FROM THE NECK DOWN.   Do not use on face/ open  Wound or open sores. Avoid contact with eyes, ears mouth and genitals (private parts).                       Wash face,  Genitals (private parts) with your normal soap.             6.  Wash thoroughly, paying special attention to the area where your surgery  will be performed.  7.  Thoroughly rinse your body with warm water from the neck down.  8.  DO NOT shower/wash with your normal soap after using and rinsing off  the CHG Soap.                9.  Pat yourself dry with a clean towel.            10.  Wear clean pajamas.            11.  Place clean sheets on your bed the night of your first shower and do not  sleep with pets. Day of Surgery : Do not apply any lotions/deodorants the morning of surgery.  Please wear clean clothes to the hospital/surgery center.  FAILURE TO FOLLOW THESE INSTRUCTIONS MAY RESULT IN THE CANCELLATION OF YOUR SURGERY PATIENT  SIGNATURE_________________________________  NURSE SIGNATURE__________________________________  ________________________________________________________________________ WHAT IS A BLOOD TRANSFUSION? Blood Transfusion Information  A transfusion is the replacement of blood or some of its parts. Blood is made up of multiple cells which provide different functions. Red blood cells carry oxygen and are used for blood loss replacement. White blood cells fight against infection. Platelets control bleeding. Plasma helps clot blood. Other blood products are available for specialized needs, such as hemophilia or other clotting disorders. BEFORE THE TRANSFUSION  Who gives blood for transfusions?  Healthy volunteers who are fully evaluated to make sure their blood is safe. This is blood bank blood. Transfusion therapy is the safest it has ever been in the practice of medicine. Before blood is taken from a donor, a complete history is taken to make sure that person has no history of diseases nor engages in risky social behavior (examples are intravenous drug use or sexual activity with multiple partners). The donor's travel history is screened to minimize risk of transmitting infections, such as malaria. The donated blood is tested for signs of infectious diseases, such as HIV and hepatitis. The blood is then tested to be sure it is compatible with you in order to minimize the chance of a transfusion reaction. If you or a relative donates blood, this is often done in anticipation of surgery and is not appropriate for emergency situations. It takes many days to process the donated blood. RISKS AND COMPLICATIONS Although transfusion therapy is very safe and saves many lives, the main dangers of transfusion include:  Getting an infectious disease. Developing a transfusion reaction. This is an allergic reaction to something in the blood you were given. Every precaution is taken to prevent this. The decision to  have a blood transfusion has been considered carefully by your caregiver before blood is given. Blood is not given unless the benefits outweigh the risks. AFTER THE TRANSFUSION Right after receiving a blood transfusion, you will usually feel much better and more energetic. This is especially true if your red blood cells have gotten low (anemic). The transfusion raises the level of the red blood cells which carry oxygen, and this usually causes an energy increase. The nurse administering the transfusion will monitor you carefully for complications.  HOME CARE INSTRUCTIONS  No special instructions are needed after a transfusion. You may find your energy is better. Speak with your caregiver about any limitations on activity for underlying diseases you may have. SEEK MEDICAL CARE IF:  Your condition is not improving after your transfusion. You develop redness or irritation at the intravenous (IV) site. SEEK IMMEDIATE MEDICAL CARE IF:  Any of the following symptoms occur over the next 12 hours: Shaking chills. You have a temperature by mouth above 102 F (38.9 C), not controlled by medicine. Chest, back, or muscle pain. People around you feel you are not acting correctly or are confused. Shortness of breath or difficulty breathing. Dizziness and fainting. You get a rash or develop hives. You have a decrease in urine output. Your urine turns a dark color or changes to pink, red, or brown. Any of the following symptoms occur over the next 10 days: You have a temperature by mouth above 102 F (38.9 C), not controlled by medicine. Shortness of breath. Weakness after normal activity. The white part of the eye turns yellow (jaundice). You have a decrease in the amount of urine or are urinating less often. Your urine turns a dark color or changes to pink, red, or brown. Document Released: 12/13/2000 Document Revised: 03/09/2012 Document Reviewed: 08/01/2008 [facility] Patient Information 2014  [place], [place].  _______________________________________________________________________

## 2022-12-20 ENCOUNTER — Encounter (HOSPITAL_COMMUNITY)
Admission: RE | Admit: 2022-12-20 | Discharge: 2022-12-20 | Disposition: A | Discharge status: Home / Self Care | Payer: Medicare HMO | Source: Ambulatory Visit | Attending: Urology | Admitting: Urology

## 2022-12-20 ENCOUNTER — Other Ambulatory Visit: Payer: Self-pay

## 2022-12-20 ENCOUNTER — Encounter (HOSPITAL_COMMUNITY): Payer: Self-pay

## 2022-12-20 VITALS — BP 112/54 | HR 60 | Temp 97.8°F | Ht 63.0 in | Wt 144.0 lb

## 2022-12-20 DIAGNOSIS — I340 Nonrheumatic mitral (valve) insufficiency: Secondary | ICD-10-CM | POA: Insufficient documentation

## 2022-12-20 DIAGNOSIS — Z01818 Encounter for other preprocedural examination: Secondary | ICD-10-CM | POA: Insufficient documentation

## 2022-12-20 DIAGNOSIS — R001 Bradycardia, unspecified: Secondary | ICD-10-CM | POA: Insufficient documentation

## 2022-12-20 HISTORY — DX: Essential (primary) hypertension: I10

## 2022-12-20 LAB — COMPREHENSIVE METABOLIC PANEL
ALT: 16 U/L (ref 0–44)
AST: 23 U/L (ref 15–41)
Albumin: 3.9 g/dL (ref 3.5–5.0)
Alkaline Phosphatase: 71 U/L (ref 38–126)
Anion gap: 8 (ref 5–15)
BUN: 20 mg/dL (ref 8–23)
CO2: 28 mmol/L (ref 22–32)
Calcium: 9.2 mg/dL (ref 8.9–10.3)
Chloride: 103 mmol/L (ref 98–111)
Creatinine, Ser: 1 mg/dL (ref 0.44–1.00)
GFR, Estimated: 59 mL/min — ABNORMAL LOW (ref >60)
Glucose, Bld: 108 mg/dL — ABNORMAL HIGH (ref 70–99)
Potassium: 4.1 mmol/L (ref 3.5–5.1)
Sodium: 139 mmol/L (ref 135–145)
Total Bilirubin: 0.7 mg/dL (ref 0.3–1.2)
Total Protein: 7.3 g/dL (ref 6.5–8.1)

## 2022-12-20 LAB — CBC
HCT: 36.7 % (ref 36.0–46.0)
Hemoglobin: 12 g/dL (ref 12.0–15.0)
MCH: 31.3 pg (ref 26.0–34.0)
MCHC: 32.7 g/dL (ref 30.0–36.0)
MCV: 95.8 fL (ref 80.0–100.0)
Platelets: 271 10*3/uL (ref 150–400)
RBC: 3.83 MIL/uL — ABNORMAL LOW (ref 3.87–5.11)
RDW: 13 % (ref 11.5–15.5)
WBC: 6.4 10*3/uL (ref 4.0–10.5)
nRBC: 0 % (ref 0.0–0.2)

## 2022-12-20 LAB — TYPE AND SCREEN
ABO/RH(D): A POS
Antibody Screen: NEGATIVE

## 2022-12-20 NOTE — Progress Notes (Signed)
For Short Stay: [place] appointment date:  Bowel Prep reminder:   For Anesthesia: PCP - Dr. Dierdre Forth. LOV: 12/05/22 Cardiologist -   Chest x-ray -  EKG -  Stress Test -  ECHO -  Cardiac Cath -  Pacemaker/ICD device last checked: Pacemaker orders received: Device Rep notified:  Spinal Cord Stimulator:  Sleep Study - Yes CPAP - NO  Fasting Blood Sugar -  Checks Blood Sugar _____ times a day Date and result of last Hgb A1c-  Last dose of GLP1 agonist-  GLP1 instructions:   Last dose of SGLT-2 inhibitors-  SGLT-2 instructions:   Blood Thinner Instructions: Aspirin Instructions: Last Dose:  Activity level: Can go up a flight of stairs and activities of daily living without stopping and without chest pain and/or shortness of breath   Able to exercise without chest pain and/or shortness of breath   Unable to go up a flight of stairs without chest pain and/or shortness of breath     Anesthesia review: HTN,OSA(NO CPAP)  Patient denies shortness of breath, fever, cough and chest pain at PAT appointment   Patient verbalized understanding of instructions that were given to them at the PAT appointment. Patient was also instructed that they will need to review over the PAT instructions again at home before surgery.

## 2022-12-25 DIAGNOSIS — J301 Allergic rhinitis due to pollen: Secondary | ICD-10-CM | POA: Diagnosis not present

## 2022-12-25 DIAGNOSIS — J3089 Other allergic rhinitis: Secondary | ICD-10-CM | POA: Diagnosis not present

## 2022-12-25 DIAGNOSIS — J3081 Allergic rhinitis due to animal (cat) (dog) hair and dander: Secondary | ICD-10-CM | POA: Diagnosis not present

## 2022-12-26 ENCOUNTER — Encounter: Payer: Self-pay | Admitting: Gastroenterology

## 2022-12-29 ENCOUNTER — Other Ambulatory Visit: Payer: Self-pay | Admitting: Family Medicine

## 2022-12-29 DIAGNOSIS — E782 Mixed hyperlipidemia: Secondary | ICD-10-CM

## 2022-12-31 ENCOUNTER — Inpatient Hospital Stay (HOSPITAL_COMMUNITY)
Admission: RE | Admit: 2022-12-31 | Discharge: 2023-01-01 | DRG: 658 | Disposition: A | Discharge status: Home / Self Care | Payer: Medicare HMO | Attending: Urology | Admitting: Urology

## 2022-12-31 ENCOUNTER — Other Ambulatory Visit: Payer: Self-pay

## 2022-12-31 ENCOUNTER — Inpatient Hospital Stay (HOSPITAL_COMMUNITY): Payer: Medicare HMO | Admitting: Certified Registered Nurse Anesthetist

## 2022-12-31 ENCOUNTER — Encounter (HOSPITAL_COMMUNITY)
Admission: RE | Disposition: A | Discharge status: Home / Self Care | Payer: Self-pay | Source: Home / Self Care | Attending: Urology

## 2022-12-31 ENCOUNTER — Encounter (HOSPITAL_COMMUNITY): Payer: Self-pay | Admitting: Urology

## 2022-12-31 DIAGNOSIS — I2510 Atherosclerotic heart disease of native coronary artery without angina pectoris: Secondary | ICD-10-CM | POA: Diagnosis not present

## 2022-12-31 DIAGNOSIS — Z833 Family history of diabetes mellitus: Secondary | ICD-10-CM

## 2022-12-31 DIAGNOSIS — Z888 Allergy status to other drugs, medicaments and biological substances status: Secondary | ICD-10-CM | POA: Diagnosis not present

## 2022-12-31 DIAGNOSIS — F419 Anxiety disorder, unspecified: Secondary | ICD-10-CM | POA: Diagnosis not present

## 2022-12-31 DIAGNOSIS — Z882 Allergy status to sulfonamides status: Secondary | ICD-10-CM | POA: Diagnosis not present

## 2022-12-31 DIAGNOSIS — I700 Atherosclerosis of aorta: Secondary | ICD-10-CM | POA: Diagnosis present

## 2022-12-31 DIAGNOSIS — R69 Illness, unspecified: Secondary | ICD-10-CM | POA: Diagnosis not present

## 2022-12-31 DIAGNOSIS — I10 Essential (primary) hypertension: Secondary | ICD-10-CM | POA: Diagnosis not present

## 2022-12-31 DIAGNOSIS — C641 Malignant neoplasm of right kidney, except renal pelvis: Secondary | ICD-10-CM | POA: Diagnosis not present

## 2022-12-31 DIAGNOSIS — E7800 Pure hypercholesterolemia, unspecified: Secondary | ICD-10-CM | POA: Diagnosis not present

## 2022-12-31 DIAGNOSIS — Z86010 Personal history of colonic polyps: Secondary | ICD-10-CM | POA: Diagnosis not present

## 2022-12-31 DIAGNOSIS — E039 Hypothyroidism, unspecified: Secondary | ICD-10-CM | POA: Diagnosis not present

## 2022-12-31 DIAGNOSIS — N2889 Other specified disorders of kidney and ureter: Secondary | ICD-10-CM

## 2022-12-31 DIAGNOSIS — Z96651 Presence of right artificial knee joint: Secondary | ICD-10-CM | POA: Diagnosis not present

## 2022-12-31 DIAGNOSIS — M810 Age-related osteoporosis without current pathological fracture: Secondary | ICD-10-CM | POA: Diagnosis not present

## 2022-12-31 DIAGNOSIS — Z83438 Family history of other disorder of lipoprotein metabolism and other lipidemia: Secondary | ICD-10-CM | POA: Diagnosis not present

## 2022-12-31 DIAGNOSIS — Z803 Family history of malignant neoplasm of breast: Secondary | ICD-10-CM | POA: Diagnosis not present

## 2022-12-31 DIAGNOSIS — Z808 Family history of malignant neoplasm of other organs or systems: Secondary | ICD-10-CM

## 2022-12-31 DIAGNOSIS — G4733 Obstructive sleep apnea (adult) (pediatric): Secondary | ICD-10-CM | POA: Diagnosis present

## 2022-12-31 DIAGNOSIS — M1990 Unspecified osteoarthritis, unspecified site: Secondary | ICD-10-CM | POA: Diagnosis present

## 2022-12-31 DIAGNOSIS — Z8249 Family history of ischemic heart disease and other diseases of the circulatory system: Secondary | ICD-10-CM | POA: Diagnosis not present

## 2022-12-31 DIAGNOSIS — F32A Depression, unspecified: Secondary | ICD-10-CM | POA: Diagnosis not present

## 2022-12-31 HISTORY — PX: ROBOT ASSISTED LAPAROSCOPIC NEPHRECTOMY: SHX5140

## 2022-12-31 LAB — HEMOGLOBIN AND HEMATOCRIT, BLOOD
HCT: 33 % — ABNORMAL LOW (ref 36.0–46.0)
Hemoglobin: 10.6 g/dL — ABNORMAL LOW (ref 12.0–15.0)

## 2022-12-31 SURGERY — NEPHRECTOMY, RADICAL, ROBOT-ASSISTED, LAPAROSCOPIC, ADULT
Anesthesia: General | Laterality: Right

## 2022-12-31 MED ORDER — FENTANYL CITRATE (PF) 100 MCG/2ML IJ SOLN
INTRAMUSCULAR | Status: AC
  Filled 2022-12-31: qty 2

## 2022-12-31 MED ORDER — ORAL CARE MOUTH RINSE: 15.0000 mL | Freq: Once | OROMUCOSAL | Status: AC

## 2022-12-31 MED ORDER — ATORVASTATIN CALCIUM 10 MG PO TABS
10.0000 mg | ORAL_TABLET | Freq: Every day | ORAL | Status: DC
  Administered 2023-01-01: 10 mg via ORAL
  Filled 2022-12-31: qty 1

## 2022-12-31 MED ORDER — LIDOCAINE 2% (20 MG/ML) 5 ML SYRINGE
INTRAMUSCULAR | Status: DC | PRN
  Administered 2022-12-31: 100 mg via INTRAVENOUS

## 2022-12-31 MED ORDER — SODIUM CHLORIDE (PF) 0.9 % IJ SOLN
INTRAMUSCULAR | Status: AC
  Filled 2022-12-31: qty 20

## 2022-12-31 MED ORDER — ACETAMINOPHEN 500 MG PO TABS
1000.0000 mg | ORAL_TABLET | Freq: Four times a day (QID) | ORAL | Status: DC
  Administered 2022-12-31 – 2023-01-01 (×3): 1000 mg via ORAL
  Filled 2022-12-31 (×4): qty 2

## 2022-12-31 MED ORDER — SODIUM CHLORIDE 0.45 % IV SOLN: INTRAVENOUS | Status: DC

## 2022-12-31 MED ORDER — MEPERIDINE HCL 50 MG/ML IJ SOLN: 6.2500 mg | INTRAMUSCULAR | Status: DC | PRN

## 2022-12-31 MED ORDER — EPHEDRINE 5 MG/ML INJ
INTRAVENOUS | Status: AC
  Filled 2022-12-31: qty 5

## 2022-12-31 MED ORDER — FENTANYL CITRATE (PF) 250 MCG/5ML IJ SOLN
INTRAMUSCULAR | Status: AC
  Filled 2022-12-31: qty 5

## 2022-12-31 MED ORDER — MIDAZOLAM HCL 2 MG/2ML IJ SOLN
INTRAMUSCULAR | Status: AC
  Filled 2022-12-31: qty 2

## 2022-12-31 MED ORDER — DIPHENHYDRAMINE HCL 50 MG/ML IJ SOLN: 12.5000 mg | Freq: Four times a day (QID) | INTRAMUSCULAR | Status: DC | PRN

## 2022-12-31 MED ORDER — LACTATED RINGERS IV SOLN: INTRAVENOUS | Status: DC

## 2022-12-31 MED ORDER — PROPOFOL 10 MG/ML IV BOLUS
INTRAVENOUS | Status: AC
  Filled 2022-12-31: qty 20

## 2022-12-31 MED ORDER — MIDAZOLAM HCL 5 MG/5ML IJ SOLN
INTRAMUSCULAR | Status: DC | PRN
  Administered 2022-12-31: 2 mg via INTRAVENOUS

## 2022-12-31 MED ORDER — DULOXETINE HCL 30 MG PO CPEP
30.0000 mg | ORAL_CAPSULE | Freq: Every day | ORAL | Status: DC
  Administered 2023-01-01: 30 mg via ORAL
  Filled 2022-12-31: qty 1

## 2022-12-31 MED ORDER — LEVOTHYROXINE SODIUM 50 MCG PO TABS: 50.0000 ug | ORAL_TABLET | ORAL | Status: DC

## 2022-12-31 MED ORDER — HYDROCODONE-ACETAMINOPHEN 5-325 MG PO TABS: 1.0000 | ORAL_TABLET | Freq: Four times a day (QID) | ORAL | 0 refills | Status: DC | PRN

## 2022-12-31 MED ORDER — ONDANSETRON HCL 4 MG/2ML IJ SOLN: 4.0000 mg | INTRAMUSCULAR | Status: DC | PRN

## 2022-12-31 MED ORDER — ONDANSETRON HCL 4 MG/2ML IJ SOLN
INTRAMUSCULAR | Status: AC
  Filled 2022-12-31: qty 2

## 2022-12-31 MED ORDER — LACTATED RINGERS IR SOLN
Status: DC | PRN
  Administered 2022-12-31: 1000 mL

## 2022-12-31 MED ORDER — ROCURONIUM BROMIDE 10 MG/ML (PF) SYRINGE
PREFILLED_SYRINGE | INTRAVENOUS | Status: DC | PRN
  Administered 2022-12-31: 10 mg via INTRAVENOUS
  Administered 2022-12-31: 20 mg via INTRAVENOUS
  Administered 2022-12-31: 50 mg via INTRAVENOUS

## 2022-12-31 MED ORDER — LISINOPRIL 5 MG PO TABS
5.0000 mg | ORAL_TABLET | Freq: Every day | ORAL | Status: DC
  Administered 2022-12-31: 5 mg via ORAL
  Filled 2022-12-31: qty 1

## 2022-12-31 MED ORDER — BUPIVACAINE LIPOSOME 1.3 % IJ SUSP
INTRAMUSCULAR | Status: DC | PRN
  Administered 2022-12-31: 20 mL

## 2022-12-31 MED ORDER — DIPHENHYDRAMINE HCL 12.5 MG/5ML PO ELIX: 12.5000 mg | ORAL_SOLUTION | Freq: Four times a day (QID) | ORAL | Status: DC | PRN

## 2022-12-31 MED ORDER — DOCUSATE SODIUM 100 MG PO CAPS
100.0000 mg | ORAL_CAPSULE | Freq: Two times a day (BID) | ORAL | Status: DC
  Administered 2022-12-31 – 2023-01-01 (×2): 100 mg via ORAL
  Filled 2022-12-31 (×2): qty 1

## 2022-12-31 MED ORDER — LACTATED RINGERS IV SOLN: INTRAVENOUS | Status: DC | PRN

## 2022-12-31 MED ORDER — PROPOFOL 10 MG/ML IV BOLUS
INTRAVENOUS | Status: DC | PRN
  Administered 2022-12-31: 150 mg via INTRAVENOUS

## 2022-12-31 MED ORDER — HYDROMORPHONE HCL 1 MG/ML IJ SOLN
INTRAMUSCULAR | Status: AC
  Administered 2022-12-31: 0.5 mg via INTRAVENOUS
  Filled 2022-12-31: qty 1

## 2022-12-31 MED ORDER — HYDROMORPHONE HCL 1 MG/ML IJ SOLN
0.5000 mg | INTRAMUSCULAR | Status: DC | PRN
  Administered 2022-12-31: 1 mg via INTRAVENOUS
  Filled 2022-12-31: qty 1

## 2022-12-31 MED ORDER — DEXAMETHASONE SODIUM PHOSPHATE 10 MG/ML IJ SOLN
INTRAMUSCULAR | Status: DC | PRN
  Administered 2022-12-31: 10 mg via INTRAVENOUS

## 2022-12-31 MED ORDER — LEVOTHYROXINE SODIUM 75 MCG PO TABS
75.0000 ug | ORAL_TABLET | ORAL | Status: DC
  Administered 2023-01-01: 75 ug via ORAL
  Filled 2022-12-31: qty 1

## 2022-12-31 MED ORDER — PHENYLEPHRINE HCL-NACL 20-0.9 MG/250ML-% IV SOLN
INTRAVENOUS | Status: DC | PRN
  Administered 2022-12-31: 35 ug/min via INTRAVENOUS

## 2022-12-31 MED ORDER — HYDROMORPHONE HCL 1 MG/ML IJ SOLN
0.2500 mg | INTRAMUSCULAR | Status: DC | PRN
  Administered 2022-12-31 (×2): 0.5 mg via INTRAVENOUS

## 2022-12-31 MED ORDER — CEFAZOLIN SODIUM-DEXTROSE 2-4 GM/100ML-% IV SOLN
2.0000 g | INTRAVENOUS | Status: AC
  Administered 2022-12-31: 2 g via INTRAVENOUS
  Filled 2022-12-31: qty 100

## 2022-12-31 MED ORDER — FENTANYL CITRATE (PF) 100 MCG/2ML IJ SOLN
INTRAMUSCULAR | Status: DC | PRN
  Administered 2022-12-31: 50 ug via INTRAVENOUS
  Administered 2022-12-31: 100 ug via INTRAVENOUS
  Administered 2022-12-31 (×3): 50 ug via INTRAVENOUS

## 2022-12-31 MED ORDER — HYOSCYAMINE SULFATE 0.125 MG SL SUBL: 0.1250 mg | SUBLINGUAL_TABLET | SUBLINGUAL | Status: DC | PRN

## 2022-12-31 MED ORDER — SODIUM CHLORIDE (PF) 0.9 % IJ SOLN
INTRAMUSCULAR | Status: DC | PRN
  Administered 2022-12-31: 20 mL

## 2022-12-31 MED ORDER — ACETAMINOPHEN 10 MG/ML IV SOLN: 1000.0000 mg | Freq: Once | INTRAVENOUS | Status: DC | PRN

## 2022-12-31 MED ORDER — GLYCOPYRROLATE PF 0.2 MG/ML IJ SOSY
PREFILLED_SYRINGE | INTRAMUSCULAR | Status: DC | PRN
  Administered 2022-12-31: .1 mg via INTRAVENOUS

## 2022-12-31 MED ORDER — ONDANSETRON HCL 4 MG/2ML IJ SOLN
INTRAMUSCULAR | Status: DC | PRN
  Administered 2022-12-31: 4 mg via INTRAVENOUS

## 2022-12-31 MED ORDER — KETOROLAC TROMETHAMINE 15 MG/ML IJ SOLN
INTRAMUSCULAR | Status: AC
  Administered 2022-12-31: 15 mg via INTRAVENOUS
  Filled 2022-12-31: qty 1

## 2022-12-31 MED ORDER — OXYCODONE HCL 5 MG PO TABS
5.0000 mg | ORAL_TABLET | ORAL | Status: DC | PRN
  Administered 2023-01-01: 5 mg via ORAL
  Filled 2022-12-31: qty 1

## 2022-12-31 MED ORDER — ROCURONIUM BROMIDE 10 MG/ML (PF) SYRINGE
PREFILLED_SYRINGE | INTRAVENOUS | Status: AC
  Filled 2022-12-31: qty 10

## 2022-12-31 MED ORDER — LIDOCAINE HCL (PF) 2 % IJ SOLN
INTRAMUSCULAR | Status: AC
  Filled 2022-12-31: qty 5

## 2022-12-31 MED ORDER — HEMOSTATIC AGENTS (NO CHARGE) OPTIME
TOPICAL | Status: DC | PRN
  Administered 2022-12-31: 1 via TOPICAL

## 2022-12-31 MED ORDER — KETOROLAC TROMETHAMINE 15 MG/ML IJ SOLN: 15.0000 mg | Freq: Once | INTRAMUSCULAR | Status: AC

## 2022-12-31 MED ORDER — ACETAMINOPHEN 10 MG/ML IV SOLN
INTRAVENOUS | Status: AC
  Administered 2022-12-31: 1000 mg via INTRAVENOUS
  Filled 2022-12-31: qty 100

## 2022-12-31 MED ORDER — ALBUMIN HUMAN 5 % IV SOLN: INTRAVENOUS | Status: DC | PRN

## 2022-12-31 MED ORDER — BUPIVACAINE LIPOSOME 1.3 % IJ SUSP
INTRAMUSCULAR | Status: AC
  Filled 2022-12-31: qty 20

## 2022-12-31 MED ORDER — CHLORHEXIDINE GLUCONATE 0.12 % MT SOLN
15.0000 mL | Freq: Once | OROMUCOSAL | Status: AC
  Administered 2022-12-31: 15 mL via OROMUCOSAL

## 2022-12-31 MED ORDER — DEXAMETHASONE SODIUM PHOSPHATE 10 MG/ML IJ SOLN
INTRAMUSCULAR | Status: AC
  Filled 2022-12-31: qty 1

## 2022-12-31 MED ORDER — SUGAMMADEX SODIUM 200 MG/2ML IV SOLN
INTRAVENOUS | Status: DC | PRN
  Administered 2022-12-31: 300 mg via INTRAVENOUS

## 2022-12-31 MED ORDER — STERILE WATER FOR IRRIGATION IR SOLN
Status: DC | PRN
  Administered 2022-12-31: 1000 mL

## 2022-12-31 MED ORDER — PROMETHAZINE HCL 25 MG/ML IJ SOLN: 6.2500 mg | INTRAMUSCULAR | Status: DC | PRN

## 2022-12-31 MED ORDER — GLYCOPYRROLATE 0.2 MG/ML IJ SOLN
INTRAMUSCULAR | Status: AC
  Filled 2022-12-31: qty 1

## 2022-12-31 MED ORDER — EPHEDRINE SULFATE-NACL 50-0.9 MG/10ML-% IV SOSY
PREFILLED_SYRINGE | INTRAVENOUS | Status: DC | PRN
  Administered 2022-12-31: 5 mg via INTRAVENOUS

## 2022-12-31 MED ORDER — DOCUSATE SODIUM 100 MG PO CAPS: 100.0000 mg | ORAL_CAPSULE | Freq: Two times a day (BID) | ORAL | Status: DC

## 2022-12-31 SURGICAL SUPPLY — 62 items
ADH SKN CLS APL DERMABOND .7 (GAUZE/BANDAGES/DRESSINGS) ×1
APL PRP STRL LF DISP 70% ISPRP (MISCELLANEOUS) ×1
BAG COUNTER SPONGE SURGICOUNT (BAG) IMPLANT
BAG LAPAROSCOPIC 12 15 PORT 16 (BASKET) ×2 IMPLANT
BAG RETRIEVAL 12/15 (BASKET) ×1
BAG SPNG CNTER NS LX DISP (BAG)
CHLORAPREP W/TINT 26 (MISCELLANEOUS) ×2 IMPLANT
CLIP LIGATING HEM O LOK PURPLE (MISCELLANEOUS) ×2 IMPLANT
CLIP LIGATING HEMO LOK XL GOLD (MISCELLANEOUS) ×2 IMPLANT
CLIP LIGATING HEMO O LOK GREEN (MISCELLANEOUS) IMPLANT
COVER SURGICAL LIGHT HANDLE (MISCELLANEOUS) ×2 IMPLANT
COVER TIP SHEARS 8 DVNC (MISCELLANEOUS) ×2 IMPLANT
COVER TIP SHEARS 8MM DA VINCI (MISCELLANEOUS) ×1
CUTTER ECHEON FLEX ENDO 45 340 (ENDOMECHANICALS) IMPLANT
DERMABOND ADVANCED .7 DNX12 (GAUZE/BANDAGES/DRESSINGS) ×2 IMPLANT
DRAPE ARM DVNC X/XI (DISPOSABLE) ×8 IMPLANT
DRAPE COLUMN DVNC XI (DISPOSABLE) ×2 IMPLANT
DRAPE DA VINCI XI ARM (DISPOSABLE) ×4
DRAPE DA VINCI XI COLUMN (DISPOSABLE) ×1
DRAPE INCISE IOBAN 66X45 STRL (DRAPES) ×2 IMPLANT
DRAPE SHEET LG 3/4 BI-LAMINATE (DRAPES) ×2 IMPLANT
ELECT PENCIL ROCKER SW 15FT (MISCELLANEOUS) ×2 IMPLANT
ELECT REM PT RETURN 15FT ADLT (MISCELLANEOUS) ×2 IMPLANT
GAUZE 4X4 16PLY ~~LOC~~+RFID DBL (SPONGE) IMPLANT
GLOVE BIO SURGEON STRL SZ 6.5 (GLOVE) ×2 IMPLANT
GLOVE BIOGEL PI IND STRL 8 (GLOVE) ×2 IMPLANT
GLOVE SURG LX STRL 7.5 STRW (GLOVE) ×4 IMPLANT
GOWN SRG XL LVL 4 BRTHBL STRL (GOWNS) ×2 IMPLANT
GOWN STRL NON-REIN XL LVL4 (GOWNS) ×1
GOWN STRL REUS W/ TWL XL LVL3 (GOWN DISPOSABLE) ×4 IMPLANT
GOWN STRL REUS W/TWL XL LVL3 (GOWN DISPOSABLE) ×2
HEMOSTAT SURGICEL 2X3 (HEMOSTASIS) IMPLANT
HOLDER FOLEY CATH W/STRAP (MISCELLANEOUS) ×2 IMPLANT
IRRIG SUCT STRYKERFLOW 2 WTIP (MISCELLANEOUS) ×1
IRRIGATION SUCT STRKRFLW 2 WTP (MISCELLANEOUS) ×2 IMPLANT
KIT BASIN OR (CUSTOM PROCEDURE TRAY) ×2 IMPLANT
KIT TURNOVER KIT A (KITS) IMPLANT
MARKER SKIN DUAL TIP RULER LAB (MISCELLANEOUS) ×2 IMPLANT
NDL INSUFFLATION 14GA 120MM (NEEDLE) ×2 IMPLANT
NEEDLE INSUFFLATION 14GA 120MM (NEEDLE) ×1 IMPLANT
PROTECTOR NERVE ULNAR (MISCELLANEOUS) ×4 IMPLANT
RELOAD STAPLE 45 2.6 WHT THIN (STAPLE) IMPLANT
SCISSORS LAP 5X45 EPIX DISP (ENDOMECHANICALS) IMPLANT
SEAL CANN UNIV 5-8 DVNC XI (MISCELLANEOUS) ×8 IMPLANT
SEAL XI 5MM-8MM UNIVERSAL (MISCELLANEOUS) ×4
SET TUBE SMOKE EVAC HIGH FLOW (TUBING) ×2 IMPLANT
SOLUTION ELECTROLUBE (MISCELLANEOUS) ×2 IMPLANT
SPIKE FLUID TRANSFER (MISCELLANEOUS) ×2 IMPLANT
STAPLE RELOAD 45 WHT (STAPLE) ×2 IMPLANT
STAPLE RELOAD 45MM WHITE (STAPLE) ×2
SUT MNCRL AB 4-0 PS2 18 (SUTURE) ×4 IMPLANT
SUT PDS AB 0 CT1 36 (SUTURE) ×4 IMPLANT
SUT VIC AB 0 CT1 27 (SUTURE) ×1
SUT VIC AB 0 CT1 27XBRD ANTBC (SUTURE) ×2 IMPLANT
SUT VIC AB 2-0 SH 27 (SUTURE) ×1
SUT VIC AB 2-0 SH 27X BRD (SUTURE) ×2 IMPLANT
TOWEL OR 17X26 10 PK STRL BLUE (TOWEL DISPOSABLE) ×2 IMPLANT
TRAY FOLEY MTR SLVR 16FR STAT (SET/KITS/TRAYS/PACK) ×2 IMPLANT
TRAY LAPAROSCOPIC (CUSTOM PROCEDURE TRAY) ×2 IMPLANT
TROCAR Z THREAD OPTICAL 12X100 (TROCAR) ×2 IMPLANT
TROCAR Z-THREAD OPTICAL 5X100M (TROCAR) IMPLANT
WATER STERILE IRR 1000ML POUR (IV SOLUTION) ×2 IMPLANT

## 2022-12-31 NOTE — Anesthesia Preprocedure Evaluation (Signed)
Anesthesia Evaluation  Patient identified by MRN, date of birth, ID band Patient awake    Reviewed: Allergy & Precautions, H&P , NPO status , Patient's Chart, lab work & pertinent test results  Airway Mallampati: I  TM Distance: >3 FB Neck ROM: full    Dental no notable dental hx.    Pulmonary neg pulmonary ROS   Pulmonary exam normal        Cardiovascular Exercise Tolerance: Good hypertension, Pt. on medications negative cardio ROS Normal cardiovascular exam     Neuro/Psych  PSYCHIATRIC DISORDERS Anxiety Depression    negative neurological ROS     GI/Hepatic negative GI ROS, Neg liver ROS,,,  Endo/Other  Hypothyroidism    Renal/GU negative Renal ROS  negative genitourinary   Musculoskeletal  (+) Arthritis , Osteoarthritis,    Abdominal Normal abdominal exam  (+)   Peds  Hematology negative hematology ROS (+)   Anesthesia Other Findings   Reproductive/Obstetrics negative OB ROS                             Anesthesia Physical Anesthesia Plan  ASA: 2  Anesthesia Plan: General   Post-op Pain Management:    Induction:   PONV Risk Score and Plan: 4 or greater and Ondansetron, Dexamethasone and Treatment may vary due to age or medical condition  Airway Management Planned: Oral ETT  Additional Equipment: None  Intra-op Plan:   Post-operative Plan:   Informed Consent: I have reviewed the patients History and Physical, chart, labs and discussed the procedure including the risks, benefits and alternatives for the proposed anesthesia with the patient or authorized representative who has indicated his/her understanding and acceptance.     Dental Advisory Given  Plan Discussed with: CRNA  Anesthesia Plan Comments:        Anesthesia Quick Evaluation

## 2022-12-31 NOTE — H&P (Signed)
Urology Preoperative H&P   Chief Complaint: Right renal mass  History of Present Illness: Morgan [name] is a 72 y.o. female with a solid and enhancing renal mass measuring 6.6 cm involving the RIGHT kidney. The mass was discovered by Dr. Matilde Sprang via CT abdomen/pelvis with and without contrast on 11/13/2022 during evaluation for lower abdominal pain and possible UTIs.   -Anatomy: Large mesophytic mass protruding from the posterior aspects of the midpole of the right kidney. Early branching of the renal vein and possibly right renal artery were seen. No abnormalities were identified involving the left kidney. No evidence of metastatic disease.  -Personal/family history of GU malignancies: denies  -Smoking history: denies  -Prior abdominal surgeries: none  -Renal function: Serum creatinine from 11/06/2022 was 0.7 with an EGFR of 86  -History of kidney stones: Denies  -She denies any specific right-sided flank pain, dysuria or prior episodes of gross hematuria.  -Cystoscopy with Dr. Matilde Sprang was WNL  Past Medical History:  Diagnosis Date   Allergy    Anxiety and depression 02/22/2014   Arthritis    Cancer ([place]) 10/2022   right kidney   Depression    Diastolic dysfunction    Negative Nuclear Stress Test 08/09/12.   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 Ander Slade pulm   OSA (obstructive sleep apnea)    Mild. No CPAP.   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   Seasonal allergies    Sleep apnea    mild not on CPAP   Tinnitus     Past Surgical History:  Procedure Laterality Date   COLONOSCOPY     POLYPECTOMY     Colon   REPLACEMENT TOTAL KNEE Right    TOTAL KNEE ARTHROPLASTY Right 04/22/2007   TOTAL KNEE ARTHROPLASTY  11/30/2012   Procedure: TOTAL KNEE  ARTHROPLASTY;  Surgeon: Gearlean Alf, MD;  Location: [facility];  Service: Orthopedics;  Laterality: Left;    Allergies:  Allergies  Allergen Reactions   Simvastatin Other (See Comments)    Myalgia   Sulfonamide Derivatives Rash    Family History  Problem Relation Age of Onset   Macular degeneration Mother    Heart attack Father    Hyperlipidemia Father    Heart disease Father    Diabetes Father    Brain cancer Father    Diabetes Brother    Breast cancer Maternal Aunt    Breast cancer Paternal Aunt    Diabetes Paternal Grandfather    Migraines Daughter    Pulmonary embolism Daughter    Arthritis Other    Colon polyps Neg Hx    Colon cancer Neg Hx    Esophageal cancer Neg Hx    Rectal cancer Neg Hx    Stomach cancer Neg Hx    Crohn's disease Neg Hx    Ulcerative colitis Neg Hx     Social History:  reports that she has never smoked. She has never been exposed to tobacco smoke. She has never used smokeless tobacco. She reports current alcohol use. She reports that she does not use drugs.  ROS: A complete review of systems was performed.  All systems are negative except for pertinent findings as noted.  Physical Exam:  Vital signs in last 24 hours: Temp:  [97.8  F (36.6 C)] 97.8 F (36.6 C) (01/02 0604) Pulse Rate:  [70] 70 (01/02 0604) Resp:  [16] 16 (01/02 0604) BP: (126)/(58) 126/58 (01/02 0604) SpO2:  [96 %] 96 % (01/02 0604) Weight:  [65.3 kg] 65.3 kg (01/02 0556) Constitutional:  Alert and oriented, No acute distress Cardiovascular: Regular rate and rhythm, No JVD Respiratory: Normal respiratory effort, Lungs clear bilaterally Psychiatric: Normal mood and affect  Laboratory Data:     Latest Ref Rng & Units 12/20/2022    2:30 PM 06/27/2022   10:27 AM 11/13/2021   11:45 AM  BMP  Glucose 70 - 99 mg/dL 108  98  115   BUN 8 - 23 mg/dL _0 Creatinine 0.44 - 1.00 mg/dL 1.00  0.84  0.81   Sodium 135 - 145 mmol/L 139  140  143   Potassium 3.5 - 5.1  mmol/L 4.1  3.9  5.2   Chloride 98 - 111 mmol/L 103  105  106   CO2 22 - 32 mmol/L _1 Calcium 8.9 - 10.3 mg/dL 9.2  9.5  9.6       Radiologic Imaging: CLINICAL DATA:  Right renal neoplasm.  * Tracking Code: BO *     EXAM:  CT CHEST WITHOUT CONTRAST     TECHNIQUE:  Multidetector CT imaging of the chest was performed following the  standard protocol without IV contrast.     RADIATION DOSE REDUCTION: This exam was performed according to the  departmental dose-optimization program which includes automated  exposure control, adjustment of the mA and/or kV according to  patient size and/or use of iterative reconstruction technique.     COMPARISON:  CT abdomen pelvis 11/13/2022 and CT chest 06/18/2017.     FINDINGS:  Cardiovascular: Atherosclerotic calcification of the aorta, aortic  valve and coronary arteries. Heart is at the upper limits of normal  in size. No pericardial effusion.     Mediastinum/Nodes: No pathologically enlarged mediastinal or  axillary lymph nodes. Hilar regions are difficult to definitively  evaluate without IV contrast. Esophagus is grossly unremarkable.     Lungs/Pleura: No suspicious pulmonary nodules. No pleural fluid.  Airway is unremarkable.     Upper Abdomen: Visualized portions of the liver, gallbladder and  adrenal glands are unremarkable. Partially imaged heterogeneous  right renal mass measures approximately 5.3 x 6.4 cm. Visualized  portions of the left kidney, spleen, pancreas, stomach and bowel are  otherwise grossly unremarkable. Small upper abdominal lymph nodes.     Musculoskeletal: Degenerative changes in the spine. No worrisome  lytic or sclerotic lesions.     IMPRESSION:  1. No evidence of metastatic disease in the chest.  2. Right renal mass, partially imaged. Please refer to CT abdomen  11/13/2022 for further details and discussion.  3. Aortic atherosclerosis (ICD10-I70.0). Coronary artery  calcification.         Electronically Signed    By: Lorin Picket M.D.    On: 12/13/2022 15:09   CLINICAL DATA:  Right lower quadrant pain and suprapubic pain.     EXAM:  CT ABDOMEN AND PELVIS WITHOUT AND WITH CONTRAST     TECHNIQUE:  Multidetector CT imaging of the abdomen and pelvis was performed  following the standard protocol before and following the bolus  administration of intravenous contrast.     RADIATION DOSE REDUCTION: This exam was performed according to the  departmental dose-optimization program which includes automated  exposure control, adjustment of  the mA and/or kV according to  patient size and/or use of iterative reconstruction technique.     CONTRAST:  125 mL Omnipaque 300     COMPARISON:  None Available.     FINDINGS:  Lower Chest: No acute findings.     Hepatobiliary: No hepatic masses identified. Gallbladder is  unremarkable. No evidence of biliary ductal dilatation.     Pancreas:  No mass or inflammatory changes.     Spleen: Within normal limits in size and appearance.     Adrenals/Urinary Tract: No adrenal masses identified. No evidence of  urolithiasis or hydronephrosis.     A heterogeneously enhancing solid mass is seen in the posterior  upper and mid poles of the right kidney, which measures 6.6 x 5.6  cm. This is consistent with renal cell carcinoma. No other masses  are seen involving either kidney. No masses seen involving the  collecting systems, ureters, or bladder.     Stomach/Bowel: No evidence of obstruction, inflammatory process or  abnormal fluid collections. Normal appendix visualized.     Vascular/Lymphatic: No pathologically enlarged lymph nodes. No  evidence of renal vein or IVC thrombus. Aortic atherosclerotic  calcification incidentally noted.     Reproductive:  No mass or other significant abnormality.     Other:  None.     Musculoskeletal:  No suspicious bone lesions identified.     IMPRESSION:  6.6 cm solid right renal mass,  consistent with renal cell carcinoma.     No evidence of metastatic disease.     No evidence of urolithiasis or hydronephrosis.     Aortic Atherosclerosis (ICD10-I70.0).        Electronically Signed    By: Marlaine Hind M.D.    On: 11/13/2022 12:38      I independently reviewed the above imaging studies.  Assessment and Plan Darnette Lampron is a 72 y.o. female with right renal mass with features concerning for RCC.    -I reviewed imaging results and films with the patient personally. We discussed that the mass in question has features concerning for malignancy. I explained the natural history of presumed renal cell carcinoma. I reviewed the AUA guidelines for evaluation and treatment of renal masses. The options of active surveillance, in situ tumor ablation, partial and radical nephrectomy was discussed. The risks of robot-assisted laparoscopic RIGHT radical nephrectomy were discussed in detail including but not limited to: negative pathology, open conversion, infection of the skin/abdominal cavity, VTE, MI/CVA, lymphatic leak, injury to adjacent solid/hollow viscus organs, bleeding requiring a blood transfusion, catastrophic bleeding, hernia formation and other imponderables. The patient voices understanding and wishes to proceed.    Ellison Hughs, MD 12/31/2022, 6:22 AM  [facility] Pager: [phone]

## 2022-12-31 NOTE — Anesthesia Postprocedure Evaluation (Signed)
Anesthesia Post Note  Patient: Morgan [name]  Procedure(s) Performed: XI ROBOTIC ASSISTED LAPAROSCOPIC NEPHRECTOMY (Right)     Patient location during evaluation: PACU Anesthesia Type: General Level of consciousness: awake and sedated Pain management: pain level controlled Vital Signs Assessment: post-procedure vital signs reviewed and stable Respiratory status: spontaneous breathing Cardiovascular status: stable Postop Assessment: no apparent nausea or vomiting Anesthetic complications: no  No notable events documented.  Last Vitals:  Vitals:   12/31/22 1045 12/31/22 1100  BP: (!) 184/78 (!) 121/100  Pulse: 85 96  Resp: 11 14  Temp:    SpO2: 100% 98%    Last Pain:  Vitals:   12/31/22 1105  TempSrc:   PainSc: [place]

## 2022-12-31 NOTE — Anesthesia Procedure Notes (Signed)
Procedure Name: Intubation Date/Time: 12/31/2022 7:41 AM  Performed by: Maxwell Caul, CRNAPre-anesthesia Checklist: Patient identified, Emergency Drugs available, Suction available and Patient being monitored Patient Re-evaluated:Patient Re-evaluated prior to induction Oxygen Delivery Method: Circle system utilized Preoxygenation: Pre-oxygenation with 100% oxygen Induction Type: IV induction Ventilation: Mask ventilation without difficulty Laryngoscope Size: Mac and 4 Grade View: Grade I Tube type: Oral Tube size: 7.0 mm Number of attempts: 1 Airway Equipment and Method: Stylet Placement Confirmation: ETT inserted through vocal cords under direct vision, positive ETCO2 and breath sounds checked- equal and bilateral Secured at: 21 cm Tube secured with: Tape Dental Injury: Teeth and Oropharynx as per pre-operative assessment

## 2022-12-31 NOTE — Op Note (Signed)
Operative Note  Preoperative diagnosis:  1.  6.6 cm right renal mass  Postoperative diagnosis: 1.  6.6 cm right renal mass  Procedure(s): 1.  Robot-assisted laparoscopic right radical nephrectomy (adrenal sparing)  Surgeon: Ellison Hughs, MD  Assistants: Debbrah Alar, PA-C An assistant was required for this surgical procedure.  The duties of the assistant included but were not limited to suctioning, passing suture, camera manipulation, retraction.  This procedure would not be able to be performed without an Environmental consultant.   Anesthesia:  General  Complications:  None  EBL: 50 mL  Specimens: 1.  Right kidney  Drains/Catheters: 1.  Foley catheter  Intraoperative findings:   The right renal hilum was hemostatic following staple ligation  Indication:  Morgan [name] is a 72 y.o. female with a solid and enhancing 6.6 cm right renal mass with features concerning for renal cell carcinoma.  She has been consented for the above procedures, voices understanding and wishes to proceed.  Description of procedure:  After informed consent was signed, the patient was taken back to the operating room and properly anesthetized.  The patient was then placed in the left lateral decubitus position with all pressure points padded.  The abdomen was then prepped and draped in the usual sterile fashion.  A time-out was then performed.     An 8 mm incision was then made lateral to the right rectus muscle at the level of the right 12th rib.  A Veress needle was then used to access the abdominal cavity.  A saline drop test showed no signs of obstruction and aspiration of the Veress needle revealed no blood or sucus.  The abdominal cavity was then insufflated to 15 mmHg.  An 8 mm robotic trocar was then atraumatically inserted into the abdominal cavity.  The robotic camera was then inserted through the port and inspection of the abdominal cavity revealed no evidence of adjacent organ or vessel injury.  We then placed three additional 8 mm robotic ports and a 12 mm assistant port in such as fashion as to triangulate the right renal hilum.  The robot was then docked into postion.   The white line of Toldt along the ascending colon was then incised, allowing [place] to reflect the colon medially and expose the anterior surface of the right kidney.  The duodenum was then Kocherized medially, which abruptly led [place] to the identification of the inferior vena cava.    Once the colon was adequately mobilized, we moved to the lower pole and identified the gonadal vein and ureter.  The gonadal vein was then left running parallel to the vena cava and the right ureter was reflected anteriorly.  Using cautious cautery, the overlying perihilar attachments were then released.  This yielded visualization of the renal hilum, which included a single right renal vein and a single right renal artery.  The perilymphatic tissue surrounding the right renal artery were carefully released so that the right renal artery was fully encircled.    A 45 mm powered endovascular stapler was then used to ligate the right renal artery and vein, en bloc.  The right hilar stump was hemostatic following staple ligation.  Hemoclips were then applied to the proximal aspects of the right ureter, which was then sharply incised.  The remaining perinephric attachments were then incised using electrocautery. Reinspection of the right retroperitoneal space revealed excellent hemostasis. Once the right kidney was fully mobile, it was placed in an Endo Catch bag and left in the abdominal cavity.  The 12 mm midline assistant port was then closed using the [employer] technique with a 0 Vicryl suture.  A right lower quadrant Gibson incision was then made in the right kidney was removed within the Endo Catch bag.  The fascia of the external and internal oblique were then closed with a running 0 PDS suture.  The skin incisions were then closed using 4-0  Monocryl.  Dermabond was applied to all skin incisions.  Plan:  Monitor on the floor overnight

## 2022-12-31 NOTE — Transfer of Care (Signed)
Immediate Anesthesia Transfer of Care Note  Patient: Morgan [name]  Procedure(s) Performed: XI ROBOTIC ASSISTED LAPAROSCOPIC NEPHRECTOMY (Right)  Patient Location: PACU  Anesthesia Type:General  Level of Consciousness: drowsy  Airway & Oxygen Therapy: Patient Spontanous Breathing and Patient connected to face mask oxygen  Post-op Assessment: Report given to RN and Post -op Vital signs reviewed and stable  Post vital signs: Reviewed and stable  Last Vitals:  Vitals Value Taken Time  BP 200/84 12/31/22 0949  Temp    Pulse 80 12/31/22 0951  Resp 13 12/31/22 0951  SpO2 100 % 12/31/22 0951  Vitals shown include unvalidated device data.  Last Pain:  Vitals:   12/31/22 0604  TempSrc: Oral  PainSc:       Patients Stated Pain Goal: 4 (68/40/33 5331)  Complications: No notable events documented.

## 2022-12-31 NOTE — Discharge Instructions (Signed)

## 2023-01-01 ENCOUNTER — Encounter (HOSPITAL_COMMUNITY): Payer: Self-pay | Admitting: Urology

## 2023-01-01 LAB — BASIC METABOLIC PANEL
Anion gap: 12 (ref 5–15)
BUN: 25 mg/dL — ABNORMAL HIGH (ref 8–23)
CO2: 22 mmol/L (ref 22–32)
Calcium: 8.5 mg/dL — ABNORMAL LOW (ref 8.9–10.3)
Chloride: 96 mmol/L — ABNORMAL LOW (ref 98–111)
Creatinine, Ser: 1.47 mg/dL — ABNORMAL HIGH (ref 0.44–1.00)
GFR, Estimated: 37 mL/min — ABNORMAL LOW (ref >60)
Glucose, Bld: 114 mg/dL — ABNORMAL HIGH (ref 70–99)
Potassium: 4 mmol/L (ref 3.5–5.1)
Sodium: 130 mmol/L — ABNORMAL LOW (ref 135–145)

## 2023-01-01 LAB — HEMOGLOBIN AND HEMATOCRIT, BLOOD
HCT: 33 % — ABNORMAL LOW (ref 36.0–46.0)
Hemoglobin: 10.9 g/dL — ABNORMAL LOW (ref 12.0–15.0)

## 2023-01-01 NOTE — Progress Notes (Signed)
1 Day Post-Op Subjective: Patient reports improved pain.  No N/V.  Ambulated x 2. Did not get an IS until this morning. Passing flatus. VSS. Hg with expected min drop and appropriate rise in Cr   Objective: Vital signs in last 24 hours: Temp:  [96.3 F (35.7 C)-98.7 F (37.1 C)] 98.2 F (36.8 C) (01/03 0345) Pulse Rate:  [77-101] 98 (01/03 0345) Resp:  [8-23] 19 (01/03 0345) BP: (118-200)/(50-100) 143/58 (01/03 0345) SpO2:  [89 %-100 %] 95 % (01/03 0345)  Intake/Output from previous day: 01/02 0701 - 01/03 0700 In: 1970 [P.O.:120; I.V.:1500; IV Piggyback:350] Out: 2300 [Urine:2250; Blood:50] Intake/Output this shift: No intake/output data recorded.  Physical Exam:  General:alert, cooperative, and no distress Cardiovascular: RRR Lungs: faint crackles left base; clear right  GI: soft; ND; approp tender, +BS Incisions: C/D/I; min erythema surrounding gibson from local injection Urine: clear Extremities: SCDs in place  Lab Results: Recent Labs    12/31/22 1009 01/01/23 0506  HGB 10.6* 10.9*  HCT 33.0* 33.0*   BMET Recent Labs    01/01/23 0506  NA 130*  K 4.0  CL 96*  CO2 22  GLUCOSE 114*  BUN 25*  CREATININE 1.47*  CALCIUM 8.5*   No results for input(s): "LABPT", "INR" in the last 72 hours. No results for input(s): "LABURIN" in the last 72 hours. Results for orders placed or performed in visit on 12/20/19  Novel Coronavirus, NAA ([facility])     Status: None   Collection Time: 12/20/19  2:05 PM   Specimen: Nasopharyngeal(NP) swabs in vial transport medium   NASOPHARYNGE  TESTING  Result Value Ref Range Status   SARS-CoV-2, NAA Not Detected Not Detected Final    Comment: This nucleic acid amplification test was developed and its performance characteristics determined by [employer]. Nucleic acid amplification tests include PCR and TMA. This test has not been FDA cleared or approved. This test has been authorized by FDA under an Emergency Use  Authorization (EUA). This test is only authorized for the duration of time the declaration that circumstances exist justifying the authorization of the emergency use of in vitro diagnostic tests for detection of SARS-CoV-2 virus and/or diagnosis of COVID-19 infection under section 564(b)(1) of the Act, 21 U.S.C. 015IFB-3(P) (1), unless the authorization is terminated or revoked sooner. When diagnostic testing is negative, the possibility of a false negative result should be considered in the context of a patient's recent exposures and the presence of clinical signs and symptoms consistent with COVID-19. An individual without symptoms of COVID-19 and who is not shedding SARS-CoV-2 virus would  expect to have a negative (not detected) result in this assay.     Studies/Results: No results found.  Assessment/Plan: 1 Day Post-Op, Procedure(s) (LRB): XI ROBOTIC ASSISTED LAPAROSCOPIC NEPHRECTOMY (Right)  Doing well.  Ambulate, Incentive spirometry DVT prophylaxis Transition to PO pain medications SL IVF Advance diet D/C foley with TOV Poss d/c home later today   LOS: 1 day   Debbrah Alar 01/01/2023, 8:20 AM

## 2023-01-01 NOTE — Progress Notes (Signed)
  Transition of Care [facility]) Screening Note   Patient Details  Name: Morgan [name] Date of Birth: 09-07-1951   Transition of Care [facility]) CM/SW Contact:    Dessa Phi, RN Phone Number: 01/01/2023, 2:04 PM    Transition of Care Department [facility]) has reviewed patient and no TOC needs have been identified at this time. We will continue to monitor patient advancement through interdisciplinary progression rounds. If new patient transition needs arise, please place a TOC consult.

## 2023-01-02 ENCOUNTER — Telehealth: Payer: Self-pay

## 2023-01-02 LAB — SURGICAL PATHOLOGY

## 2023-01-02 NOTE — Patient Outreach (Signed)
  Care Coordination TOC Note Transition Care Management Unsuccessful Follow-up Telephone Call  Date of discharge and from where:  01/01/23-[facility]   Attempts:  1st Attempt  Reason for unsuccessful TCM follow-up call:  Left voice message    [facility]et[facility] [place] Management Telephonic Care Management Coordinator Direct Phone: [phone] Toll Free: [phone] Fax: [phone]

## 2023-01-02 NOTE — Patient Outreach (Signed)
  Care Coordination TOC Note Transition Care Management Follow-up Telephone Call Date of discharge and from where: 01/01/23-[facility] Dx: "renal mass s/p nephrectomy" How have you been since you were released from the hospital? Incoming call form patient returning RN CM call. Patient voices that she is doing okay. She is having the most pain when she tries to move round in bed. She is using pillows and techniques that they taught her in the hospital. Last dose of pain med was yesterday at 6pm. Pain rating pain 9/10 at present. Encouraged patient to take some pain med ASAP. Discussed pain mgmt with patient as she is fearful and concerned about taking too much pain med. She will try pain mgmt measures discussed.  Any questions or concerns? No  Items Reviewed: Did the pt receive and understand the discharge instructions provided? Yes  Medications obtained and verified? Yes  Other? Yes -pain mgmt Any new allergies since your discharge? No  Dietary orders reviewed? Yes Do you have support at home? Yes   Home Care and Equipment/Supplies: Were home health services ordered? not applicable If so, what is the name of the agency? N/A  Has the agency set up a time to come to the patient's home? not applicable Were any new equipment or medical supplies ordered?  No What is the name of the medical supply agency? N/A Were you able to get the supplies/equipment? not applicable Do you have any questions related to the use of the equipment or supplies? No  Functional Questionnaire: (I = Independent and D = Dependent) ADLs: A  Bathing/Dressing- A  Meal Prep- A  Eating- I  Maintaining continence- I  Transferring/Ambulation- I  Managing Meds- I  Follow up appointments reviewed:  PCP Hospital f/u appt confirmed? No  . [facility] f/u appt confirmed? Yes  Scheduled to see Dr. Lovena Neighbours on 01/16/23 @ 1:15pm. Are transportation arrangements needed? No  If their condition worsens, is  the pt aware to call PCP or go to the Emergency Dept.? Yes Was the patient provided with contact information for the PCP's office or ED? Yes Was to pt encouraged to call back with questions or concerns? Yes  SDOH assessments and interventions completed:   Yes SDOH Interventions Today    Flowsheet Row Most Recent Value  SDOH Interventions   Food Insecurity Interventions Intervention Not Indicated  Transportation Interventions Intervention Not Indicated       Care Coordination Interventions:  Education provided    Encounter Outcome:  Pt. Visit Completed    Enzo Montgomery, RN,BSN,CCM [place] Management Telephonic Care Management Coordinator Direct Phone: [phone] Toll Free: [phone] Fax: [phone]

## 2023-01-02 NOTE — Discharge Summary (Signed)
Date of admission: 12/31/2022  Date of discharge: 01/01/2023  Admission diagnosis: 6.6 cm right renal mass  Discharge diagnosis: 6.6 right renal mass  Procedures:  Robotic right radical nephrectomy   History and Physical: For full details, please see admission history and physical. Briefly, Morgan [name] is a 72 y.o. year old patient with a solid and enhancing right renal mass with features concerning for renal call carcinoma. She is s/p right robotic nephrectomy on 12/31/22.    Hospital Course: Routine post-op course following right nephrectomy   Physical Exam:  General: Alert and oriented CV: RRR, palpable distal pulses Lungs: CTAB, equal chest rise Abdomen: Soft, NTND, no rebound or guarding Incisions: clean, dry and intact Ext: NT, No erythema  Laboratory values:  Recent Labs    12/31/22 1009 01/01/23 0506  HGB 10.6* 10.9*  HCT 33.0* 33.0*   Recent Labs    01/01/23 0506  CREATININE 1.47*    Disposition: Home  Discharge instruction: The patient was instructed to be ambulatory but told to refrain from heavy lifting, strenuous activity, or driving.  Discharge medications:  Allergies as of 01/01/2023       Reactions   Simvastatin Other (See Comments)   Myalgia   Sulfonamide Derivatives Rash        Medication List     STOP taking these medications    MULTIVITAMIN ADULTS 50+ PO       TAKE these medications    atorvastatin 10 MG tablet Commonly known as: LIPITOR TAKE 1 TABLET BY MOUTH EVERY DAY   azelastine 0.1 % nasal spray Commonly known as: ASTELIN Place 2 sprays into both nostrils 2 (two) times daily. What changed:  when to take this reasons to take this   diazepam 5 MG tablet Commonly known as: VALIUM TAKE 1/2 TO 1 TABLET BY MOUTH EVERY 8 HOURS AS NEEDED FOR ANXIETY What changed: See the new instructions.   docusate sodium 100 MG capsule Commonly known as: COLACE Take 1 capsule (100 mg total) by mouth 2 (two) times daily.    DULoxetine 30 MG capsule Commonly known as: Cymbalta Take 1 capsule (30 mg total) by mouth daily.   EPINEPHrine 0.3 mg/0.3 mL Soaj injection Commonly known as: EPI-PEN Inject 0.3 mg into the muscle as needed for anaphylaxis.   HYDROcodone-acetaminophen 5-325 MG tablet Commonly known as: Norco Take 1-2 tablets by mouth every 6 (six) hours as needed for moderate pain or severe pain.   levothyroxine 50 MCG tablet Commonly known as: SYNTHROID TAKE 1 TABLET (50 MCG TOTAL) BY MOUTH DAILY BEFORE BREAKFAST. EVERY TUESDAY AND THURSDAY What changed: Another medication with the same name was changed. Make sure you understand how and when to take each.   levothyroxine 75 MCG tablet Commonly known as: SYNTHROID TAKE 1 TABLET BY MOUTH EVERY DAY What changed:  when to take this additional instructions   lisinopril-hydrochlorothiazide 10-12.5 MG tablet Commonly known as: ZESTORETIC Take 1 tablet by mouth daily.   valACYclovir 1000 MG tablet Commonly known as: VALTREX TAKE 2 TABLETS BY MOUTH TWICE A DAY ONCE AS NEEDED FOR COLD SORES        Followup:   Follow-up Information     Ceasar Mons, MD Follow up on 01/16/2023.   Specialty: Urology Why: at 1:15 Contact information: [address] [place] [id number] [phone]

## 2023-01-09 DIAGNOSIS — J3081 Allergic rhinitis due to animal (cat) (dog) hair and dander: Secondary | ICD-10-CM | POA: Diagnosis not present

## 2023-01-09 DIAGNOSIS — J3089 Other allergic rhinitis: Secondary | ICD-10-CM | POA: Diagnosis not present

## 2023-01-09 DIAGNOSIS — J301 Allergic rhinitis due to pollen: Secondary | ICD-10-CM | POA: Diagnosis not present

## 2023-01-15 DIAGNOSIS — J301 Allergic rhinitis due to pollen: Secondary | ICD-10-CM | POA: Diagnosis not present

## 2023-01-15 DIAGNOSIS — J3081 Allergic rhinitis due to animal (cat) (dog) hair and dander: Secondary | ICD-10-CM | POA: Diagnosis not present

## 2023-01-15 DIAGNOSIS — J3089 Other allergic rhinitis: Secondary | ICD-10-CM | POA: Diagnosis not present

## 2023-01-16 DIAGNOSIS — Z85528 Personal history of other malignant neoplasm of kidney: Secondary | ICD-10-CM | POA: Diagnosis not present

## 2023-01-16 DIAGNOSIS — C641 Malignant neoplasm of right kidney, except renal pelvis: Secondary | ICD-10-CM | POA: Diagnosis not present

## 2023-01-22 DIAGNOSIS — J3081 Allergic rhinitis due to animal (cat) (dog) hair and dander: Secondary | ICD-10-CM | POA: Diagnosis not present

## 2023-01-22 DIAGNOSIS — J301 Allergic rhinitis due to pollen: Secondary | ICD-10-CM | POA: Diagnosis not present

## 2023-01-22 DIAGNOSIS — J3089 Other allergic rhinitis: Secondary | ICD-10-CM | POA: Diagnosis not present

## 2023-01-30 DIAGNOSIS — J3089 Other allergic rhinitis: Secondary | ICD-10-CM | POA: Diagnosis not present

## 2023-01-30 DIAGNOSIS — J301 Allergic rhinitis due to pollen: Secondary | ICD-10-CM | POA: Diagnosis not present

## 2023-01-30 DIAGNOSIS — J3081 Allergic rhinitis due to animal (cat) (dog) hair and dander: Secondary | ICD-10-CM | POA: Diagnosis not present

## 2023-02-07 ENCOUNTER — Encounter: Payer: Self-pay | Admitting: Family Medicine

## 2023-02-07 ENCOUNTER — Ambulatory Visit (INDEPENDENT_AMBULATORY_CARE_PROVIDER_SITE_OTHER): Payer: Medicare HMO | Admitting: Family Medicine

## 2023-02-07 VITALS — BP 122/62 | HR 72 | Temp 97.9°F | Ht 63.0 in | Wt 144.0 lb

## 2023-02-07 DIAGNOSIS — J301 Allergic rhinitis due to pollen: Secondary | ICD-10-CM | POA: Diagnosis not present

## 2023-02-07 DIAGNOSIS — D62 Acute posthemorrhagic anemia: Secondary | ICD-10-CM | POA: Diagnosis not present

## 2023-02-07 DIAGNOSIS — J3081 Allergic rhinitis due to animal (cat) (dog) hair and dander: Secondary | ICD-10-CM | POA: Diagnosis not present

## 2023-02-07 DIAGNOSIS — R0789 Other chest pain: Secondary | ICD-10-CM | POA: Diagnosis not present

## 2023-02-07 DIAGNOSIS — Z905 Acquired absence of kidney: Secondary | ICD-10-CM | POA: Insufficient documentation

## 2023-02-07 DIAGNOSIS — I10 Essential (primary) hypertension: Secondary | ICD-10-CM | POA: Diagnosis not present

## 2023-02-07 DIAGNOSIS — F5102 Adjustment insomnia: Secondary | ICD-10-CM

## 2023-02-07 DIAGNOSIS — C641 Malignant neoplasm of right kidney, except renal pelvis: Secondary | ICD-10-CM | POA: Diagnosis not present

## 2023-02-07 DIAGNOSIS — J3089 Other allergic rhinitis: Secondary | ICD-10-CM | POA: Diagnosis not present

## 2023-02-07 DIAGNOSIS — E782 Mixed hyperlipidemia: Secondary | ICD-10-CM

## 2023-02-07 DIAGNOSIS — R079 Chest pain, unspecified: Secondary | ICD-10-CM | POA: Diagnosis not present

## 2023-02-07 DIAGNOSIS — R69 Illness, unspecified: Secondary | ICD-10-CM | POA: Diagnosis not present

## 2023-02-07 MED ORDER — AZELASTINE HCL 0.1 % NA SOLN: 2.0000 | Freq: Every day | NASAL | Status: AC | PRN

## 2023-02-07 MED ORDER — LEVOTHYROXINE SODIUM 75 MCG PO TABS: ORAL_TABLET | ORAL | Status: DC

## 2023-02-07 MED ORDER — DIAZEPAM 5 MG PO TABS: 2.5000 mg | ORAL_TABLET | Freq: Every evening | ORAL | Status: DC | PRN

## 2023-02-07 NOTE — Patient Instructions (Signed)
Please return in June for your complete physical.   If you have any questions or concerns, please don't hesitate to send me a message via MyChart or call the office at [phone]. Thank you for visiting with Morgan [name] today! It's our pleasure caring for you.   I have placed a referral to cardiology to see if further heart testing is warranted. It may be due to the mild EKG change, your anemia from after the surgery and your symptoms.   Start an aspirin 76m daily in the meantime.   Nonspecific Chest Pain, Adult Chest pain is an uncomfortable, tight, or painful feeling in the chest. The pain can feel like a crushing, aching, or squeezing pressure. A person can feel a burning or tingling sensation. Chest pain can also be felt in your back, neck, jaw, shoulder, or arm. This pain can be worse when you move, sneeze, or take a deep breath. Chest pain can be caused by a condition that is life-threatening. This must be treated right away. It can also be caused by something that is not life-threatening. If you have chest pain, it can be hard to know the difference, so it is important to get help right away to make sure that you do not have a serious condition. Some life-threatening causes of chest pain include: Heart attack. A tear in the body's main blood vessel (aortic dissection). Inflammation around your heart (pericarditis). A problem in the lungs, such as a blood clot (pulmonary embolism) or a collapsed lung (pneumothorax). Some non life-threatening causes of chest pain include: Heartburn. Anxiety or stress. Damage to the bones, muscles, and cartilage that make up your chest wall. Pneumonia or bronchitis. Shingles infection (varicella-zoster virus). Your chest pain may come and go. It may also be constant. Your health care provider will do tests and other studies to find the cause of your pain. Treatment will depend on the cause of your chest pain. Follow these instructions at home: Medicines Take  over-the-counter and prescription medicines only as told by your health care provider. If you were prescribed an antibiotic medicine, take it as told by your health care provider. Do not stop taking the antibiotic even if you start to feel better. Activity Avoid any activities that cause chest pain. Do not lift anything that is heavier than 10 lb (4.5 kg), or the limit that you are told, until your health care provider says that it is safe. Rest as directed by your health care provider. Return to your normal activities only as told by your health care provider. Ask your health care provider what activities are safe for you. Lifestyle     Do not use any products that contain nicotine or tobacco, such as cigarettes, e-cigarettes, and chewing tobacco. If you need help quitting, ask your health care provider. Do not drink alcohol. Make healthy lifestyle changes as recommended. These may include: Getting regular exercise. Ask your health care provider to suggest some exercises that are safe for you. Eating a heart-healthy diet. This includes plenty of fresh fruits and vegetables, whole grains, low-fat (lean) protein, and low-fat dairy products. A dietitian can help you find healthy eating options. Maintaining a healthy weight. Managing any other health conditions you may have, such as high blood pressure (hypertension) or diabetes. Reducing stress, such as with yoga or relaxation techniques. General instructions Pay attention to any changes in your symptoms. It is up to you to get the results of any tests that were done. Ask your health care provider, or  the department that is doing the tests, when your results will be ready. Keep all follow-up visits as told by your health care provider. This is important. You may be asked to go for further testing if your chest pain does not go away. Contact a health care provider if: Your chest pain does not go away. You feel depressed. You have a  fever. You notice changes in your symptoms or develop new symptoms. Get help right away if: Your chest pain gets worse. You have a cough that gets worse, or you cough up blood. You have severe pain in your abdomen. You faint. You have sudden, unexplained chest discomfort. You have sudden, unexplained discomfort in your arms, back, neck, or jaw. You have shortness of breath at any time. You suddenly start to sweat, or your skin gets clammy. You feel nausea or you vomit. You suddenly feel lightheaded or dizzy. You have severe weakness, or unexplained weakness or fatigue. Your heart begins to beat quickly, or it feels like it is skipping beats. These symptoms may represent a serious problem that is an emergency. Do not wait to see if the symptoms will go away. Get medical help right away. Call your local emergency services (911 in the [place] Do not drive yourself to the hospital. Summary Chest pain can be caused by a condition that is serious and requires urgent treatment. It may also be caused by something that is not life-threatening. Your health care provider may do lab tests and other studies to find the cause of your pain. Follow your health care provider's instructions on taking medicines, making lifestyle changes, and getting emergency treatment if symptoms become worse. Keep all follow-up visits as told by your health care provider. This includes visits for any further testing if your chest pain does not go away. This information is not intended to replace advice given to you by your health care provider. Make sure you discuss any questions you have with your health care provider. Document Revised: 03/01/2021 Document Reviewed: 03/01/2021 Elsevier Patient Education  [place].

## 2023-02-07 NOTE — Progress Notes (Signed)
NSR with inverted t waves in v1 and v2. When compared to old EKGs, similar but flipped T in V2 is new. (2021 EKG).  Rec cards consult in setting of mild postop anemia, chest pain brought on by stress at rest w/ HTN and HLD> referral placed and pt aware.

## 2023-02-07 NOTE — Progress Notes (Unsigned)
Subjective  CC:  Chief Complaint  Patient presents with   Shoulder Pain    Left shoulder pain into chest x 1 month-- no other sx. Pt stated that she feels like it is stress bc she is dealing with Power of Clance Boll her brother. She has not felt the pain in the past 2 days.     HPI: Morgan [name] is a 72 y.o. female who presents to the office today to address the problems listed above in the chief complaint. ***  Assessment  1. Essential hypertension   2. Clear cell renal cell carcinoma, right ([place])   3. S/p nephrectomy   4. Atypical chest pain      Plan  ***:  ***  Follow up: No follow-ups on file.  05/19/2023  Orders Placed This Encounter  Procedures   EKG 12-Lead   No orders of the defined types were placed in this encounter.     I reviewed the patients updated PMH, FH, and SocHx.    Patient Active Problem List   Diagnosis Date Noted   Mixed hyperlipidemia 07/14/2007    Priority: High   Acquired hypothyroidism 06/29/2007    Priority: High   Obstructive sleep apnea 12/03/2021    Priority: Medium    Major depression, recurrent, chronic ([place]) 09/25/2020    Priority: Medium    IFG (impaired fasting glucose) 05/19/2020    Priority: Medium    Osteoporosis, post-menopausal 11/12/2019    Priority: Medium    Colon polyps 11/12/2019    Priority: Medium    GAD (generalized anxiety disorder) 02/22/2014    Priority: Medium    Diastolic dysfunction [id number]    Priority: Medium    Osteoarthritis 06/29/2007    Priority: Medium    Essential tremor 04/30/2021    Priority: Low   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    Priority: Low   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 (Patient taking differently: Place 2 sprays into both nostrils daily as needed for allergies.)   diazepam (VALIUM) 5 MG tablet TAKE 1/2 TO 1 TABLET BY MOUTH EVERY 8 HOURS AS NEEDED FOR ANXIETY (Patient taking differently: Take 2.5-5 mg by mouth at bedtime as needed for sedation.)   docusate sodium (COLACE) 100 MG capsule Take 1 capsule (100 mg total) by mouth 2 (two) times daily.   DULoxetine (CYMBALTA) 30 MG capsule Take 1 capsule (30 mg total) by mouth daily.   EPINEPHrine 0.3 mg/0.3 mL IJ SOAJ injection Inject 0.3 mg into the muscle as needed for anaphylaxis.   levothyroxine (SYNTHROID) 50 MCG tablet TAKE 1 TABLET (50 MCG TOTAL) BY MOUTH DAILY BEFORE BREAKFAST. EVERY TUESDAY AND THURSDAY   levothyroxine (SYNTHROID) 75 MCG tablet TAKE 1 TABLET BY MOUTH EVERY DAY (Patient taking differently: Take 75 mcg by mouth See admin instructions. Take 75 mcg by mouth on Monday, Wednesday, Friday, Saturday and Sunday)   lisinopril-hydrochlorothiazide (ZESTORETIC) 10-12.5 MG tablet Take 1 tablet by mouth daily.   valACYclovir (  VALTREX) 1000 MG tablet TAKE 2 TABLETS BY MOUTH TWICE A DAY ONCE AS NEEDED FOR COLD SORES    Allergies: Patient is allergic to simvastatin and sulfonamide derivatives. Family History: Patient family history includes Arthritis in an other family member; Brain cancer in her father; Breast cancer in her maternal aunt and paternal aunt; Diabetes in her brother, father, and paternal grandfather; Heart attack in her father; Heart disease in her father; Hyperlipidemia in her father; Macular degeneration in her mother; Migraines in her daughter; Pulmonary embolism in her daughter. Social History:  Patient  reports that she has never smoked. She has never been exposed to tobacco smoke. She has never  used smokeless tobacco. She reports current alcohol use. She reports that she does not use drugs.  Review of Systems: Constitutional: Negative for fever malaise or anorexia Cardiovascular: negative for chest pain Respiratory: negative for SOB or persistent cough Gastrointestinal: negative for abdominal pain  Objective  Vitals: BP 122/62   Pulse 72   Temp 97.9 F (36.6 C)   Ht 5' 3"$  (1.6 m)   Wt 144 lb (65.3 kg)   SpO2 98%   BMI 25.51 kg/m  General: no acute distress , A&Ox3 HEENT: PEERL, conjunctiva normal, neck is supple Cardiovascular:  RRR without murmur or gallop.  Respiratory:  Good breath sounds bilaterally, CTAB with normal respiratory effort Skin:  Warm, no rashes  Commons side effects, risks, benefits, and alternatives for medications and treatment plan prescribed today were discussed, and the patient expressed understanding of the given instructions. Patient is instructed to call or message via MyChart if he/she has any questions or concerns regarding our treatment plan. No barriers to understanding were identified. We discussed Red Flag symptoms and signs in detail. Patient expressed understanding regarding what to do in case of urgent or emergency type symptoms.  Medication list was reconciled, printed and provided to the patient in AVS. Patient instructions and summary information was reviewed with the patient as documented in the AVS. This note was prepared with assistance of Dragon voice recognition software. Occasional wrong-word or sound-a-like substitutions may have occurred due to the inherent limitations of voice recognition software

## 2023-02-13 DIAGNOSIS — J301 Allergic rhinitis due to pollen: Secondary | ICD-10-CM | POA: Diagnosis not present

## 2023-02-13 DIAGNOSIS — J3089 Other allergic rhinitis: Secondary | ICD-10-CM | POA: Diagnosis not present

## 2023-02-13 DIAGNOSIS — C641 Malignant neoplasm of right kidney, except renal pelvis: Secondary | ICD-10-CM | POA: Diagnosis not present

## 2023-02-13 DIAGNOSIS — J3081 Allergic rhinitis due to animal (cat) (dog) hair and dander: Secondary | ICD-10-CM | POA: Diagnosis not present

## 2023-02-23 NOTE — Progress Notes (Addendum)
Cardiology Office Note:   Date:  02/24/2023  NAME:  Morgan [name]    MRN: [id number] DOB:  Jan 23, 1951   PCP:  Morgan Ora, MD  Cardiologist:  None  Electrophysiologist:  None   Referring MD: Morgan Ora, MD   Chief Complaint  Patient presents with   Chest Pain    History of Present Illness:   Morgan [name] is a 72 y.o. female with a hx of HTN, HLD who is being seen today for the evaluation of chest pain at the request of Morgan Ora, MD. she reports for the past 1 to 2 months she has noticed intermittent left shoulder pain.  Described as sharp and dull.  Also moves into the left chest.  Does not occur with activity.  She reports it is triggered by stress.  Last hours.  Symptoms do not appear to be exertional.  She reports she is under a lot of stress.  She was recently diagnosed with renal cell carcinoma.  She is status post right nephrectomy.  Her brother also has dementia.  She is working to get him to memory care.  She also tells me that she has been dizzy and lightheaded.  Her blood pressure is 118/62.  She reports it is low at home.  I suspect she is on too much blood pressure medication.  Lipids are below which are acceptable.  She does take Lipitor 10 mg daily.  She has never had a heart attack or stroke.  There is a family history of this.  She is widowed.  She reports she has 1 biological child and grandchildren in [place] state.  She has 2 stepdaughters.  CV examination normal.  EKG is normal.  She wishes to know that her heart is healthy.  Problem List HTN HLD -T chol 188, HDL 65, LDL 108, TG 72 RCC s/p R nephrectomy  -12/2022  Past Medical History: Past Medical History:  Diagnosis Date   Allergy    Anxiety and depression 02/22/2014   Arthritis    Cancer (HCC) 10/2022   right kidney   Depression    Diastolic dysfunction    Negative Nuclear Stress Test 08/09/12.   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 Wynona Neat pulm   OSA (obstructive sleep apnea)    Mild. No CPAP.   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   Seasonal allergies    Sleep apnea    mild not on CPAP   Tinnitus     Past Surgical History: Past Surgical History:  Procedure Laterality Date   COLONOSCOPY     POLYPECTOMY     Colon   REPLACEMENT TOTAL KNEE Right    ROBOT ASSISTED LAPAROSCOPIC NEPHRECTOMY Right 12/31/2022   Procedure: XI ROBOTIC ASSISTED LAPAROSCOPIC NEPHRECTOMY;  Surgeon: Rene Paci, MD;  Location: [facility];  Service: Urology;  Laterality: Right;  3 HRS   TOTAL KNEE ARTHROPLASTY Right 04/22/2007   TOTAL KNEE ARTHROPLASTY  11/30/2012   Procedure: TOTAL KNEE ARTHROPLASTY;  Surgeon: Loanne Drilling, MD;  Location: [facility];  Service: Orthopedics;  Laterality: Left;    Current Medications: Current Meds  Medication Sig   atorvastatin (LIPITOR) 10 MG tablet TAKE 1 TABLET BY MOUTH EVERY DAY   azelastine (ASTELIN)  0.1 % nasal spray Place 2 sprays into both nostrils daily as needed for allergies.   diazepam (VALIUM) 5 MG tablet Take 0.5-1 tablets (2.5-5 mg total) by mouth at bedtime as needed for sedation.   DULoxetine (CYMBALTA) 30 MG capsule Take 1 capsule (30 mg total) by mouth daily.   EPINEPHrine 0.3 mg/0.3 mL IJ SOAJ injection Inject 0.3 mg into the muscle as needed for anaphylaxis.   levothyroxine (SYNTHROID) 50 MCG tablet TAKE 1 TABLET (50 MCG TOTAL) BY MOUTH DAILY BEFORE BREAKFAST. EVERY TUESDAY AND THURSDAY   levothyroxine (SYNTHROID) 75 MCG tablet Take 75 mcg by mouth on Monday, Wednesday, Friday, Saturday and Sunday   metoprolol tartrate (LOPRESSOR) 100 MG tablet Take 1 tablet by mouth once for procedure.   valACYclovir (VALTREX) 1000 MG tablet TAKE 2 TABLETS BY MOUTH TWICE A DAY ONCE AS NEEDED FOR COLD SORES    [DISCONTINUED] lisinopril-hydrochlorothiazide (ZESTORETIC) 10-12.5 MG tablet Take 1 tablet by mouth daily.     Allergies:    Simvastatin and Sulfonamide derivatives   Social History: Social History   Socioeconomic History   Marital status: Widowed    Spouse name: Not on file   Number of children: 1   Years of education: Not on file   Highest education level: Not on file  Occupational History    Comment: retired    Occupation: Retired Photographer  Tobacco Use   Smoking status: Never    Passive exposure: Never   Smokeless tobacco: Never  Vaping Use   Vaping Use: Never used  Substance and Sexual Activity   Alcohol use: Yes    Comment: rare   Drug use: No   Sexual activity: Yes    Comment: lives with husband, 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 No major dietary restrictions  Other Topics Concern   Not on file  Social History Narrative   Not on file   Social Determinants of Health   Financial Resource Strain: Low Risk  (05/06/2022)   Overall Financial Resource Strain (CARDIA)    Difficulty of Paying Living Expenses: Not hard at all  Food Insecurity: No Food Insecurity (01/02/2023)   Hunger Vital Sign    Worried About Running Out of Food in the Last Year: Never true    Ran Out of Food in the Last Year: Never true  Transportation Needs: No Transportation Needs (01/02/2023)   PRAPARE - Administrator, Civil Service (Medical): No    Lack of Transportation (Non-Medical): No  Physical Activity: Inactive (05/06/2022)   Exercise Vital Sign    Days of Exercise per Week: 0 days    Minutes of Exercise per Session: 0 min  Stress: Stress Concern Present (05/06/2022)   [employer] of Occupational Health - Occupational Stress Questionnaire    Feeling of Stress : Rather much  Social Connections: Moderately Isolated (05/06/2022)   Social Connection and Isolation Panel [NHANES]    Frequency of Communication with Friends and Family: More than three times a week    Frequency of  Social Gatherings with Friends and Family: More than three times a week    Attends Religious Services: Never    Database administrator or Organizations: Yes    Attends Banker Meetings: 1 to 4 times per year    Marital Status: Widowed     Family History: The patient's family history includes Arthritis in an other family member; Brain cancer in her father; Breast cancer in her maternal aunt and paternal aunt; Diabetes in her  brother, father, and paternal grandfather; Heart attack in her father; Heart disease in her father; Hyperlipidemia in her father; Macular degeneration in her mother; Migraines in her daughter; Pulmonary embolism in her daughter. There is no history of Colon polyps, Colon cancer, Esophageal cancer, Rectal cancer, Stomach cancer, Crohn's disease, or Ulcerative colitis.  ROS:   All other ROS reviewed and negative. Pertinent positives noted in the HPI.     EKGs/Labs/Other Studies Reviewed:   The following studies were personally reviewed by me today:  EKG:  EKG is ordered today.  The ekg ordered today demonstrates normal sinus rhythm heart rate 71, no acute ischemic changes or evidence of infarction, and was personally reviewed by me.   Recent Labs: 06/27/2022: TSH 1.33 12/20/2022: ALT 16; Platelets 271 01/01/2023: BUN 25; Creatinine, Ser 1.47; Hemoglobin 10.9; Potassium 4.0; Sodium 130   Recent Lipid Panel    Component Value Date/Time   CHOL 188 06/27/2022 1027   TRIG 72.0 06/27/2022 1027   HDL 65.80 06/27/2022 1027   CHOLHDL 3 06/27/2022 1027   VLDL 14.4 06/27/2022 1027   LDLCALC 108 (H) 06/27/2022 1027   LDLCALC 110 (H) 05/19/2020 1552   LDLDIRECT 152.7 02/01/2013 0912    Physical Exam:   VS:  BP 118/62   Pulse 71   Ht 5\' 3"  (1.6 m)   Wt 143 lb (64.9 kg)   BMI 25.33 kg/m    Wt Readings from Last 3 Encounters:  02/24/23 143 lb (64.9 kg)  02/07/23 144 lb (65.3 kg)  12/31/22 144 lb (65.3 kg)    General: Well nourished, well developed, in no  acute distress Head: Atraumatic, normal size  Eyes: PEERLA, EOMI  Neck: Supple, no JVD Endocrine: No thryomegaly Cardiac: Normal S1, S2; RRR; no murmurs, rubs, or gallops Lungs: Clear to auscultation bilaterally, no wheezing, rhonchi or rales  Abd: Soft, nontender, no hepatomegaly  Ext: No edema, pulses 2+ Musculoskeletal: No deformities, BUE and BLE strength normal and equal Skin: Warm and dry, no rashes   Neuro: Alert and oriented to person, place, time, and situation, CNII-XII grossly intact, no focal deficits  Psych: Normal mood and affect   ASSESSMENT:   Mana Allbright is a 72 y.o. female who presents for the following: 1. Precordial pain   2. SOB (shortness of breath) on exertion   3. Renovascular hypertension     PLAN:   1. Precordial pain 2. SOB (shortness of breath) on exertion -Symptoms of chest discomfort are likely stress related.  Risk factors include hyperlipidemia and family history.  Also age.  Her EKG is normal.  To exclude coronary disease we will proceed with coronary CTA.  Recent BMP shows that her kidney function is a bit elevated.  Given recent right nephrectomy we will recheck a BMP today to make sure it is okay for her to receive IV contrast.  She tells me that it was checked through [facility] urology and was stable.  We will make sure.  She will take 100 mg metoprolol tartrate to before the scan.  She will also obtain an echocardiogram.  No murmurs on examination today.  Suspect her shortness of breath is related to deconditioning.  Will exclude any heart disease with the above testing. -Update: Serum creatinine elevated 1.6.  We will not proceed with coronary CTA.  We will cancel this.  We will proceed with cardiac PET scan to exclude obstructive CAD.  Shared Decision Making/Informed Consent The risks [chest pain, shortness of breath, cardiac arrhythmias, dizziness, blood pressure  fluctuations, myocardial infarction, stroke/transient ischemic attack,  nausea, vomiting, allergic reaction, radiation exposure, metallic taste sensation and life-threatening complications (estimated to be 1 in 10,000)], benefits (risk stratification, diagnosing coronary artery disease, treatment guidance) and alternatives of a cardiac PET stress test were discussed in detail with Ms. Korpi and she agrees to proceed.    3. Renovascular hypertension -She reports dizziness and lightheadedness.  Suspect she is on too much blood pressure medication.  Stop lisinopril-HCTZ.  Prescribed lisinopril 5 mg daily.  She will keep a close eye on her blood pressure at home.  Disposition: Return if symptoms worsen or fail to improve.  Medication Adjustments/Labs and Tests Ordered: Current medicines are reviewed at length with the patient today.  Concerns regarding medicines are outlined above.  Orders Placed This Encounter  Procedures   CT CORONARY MORPH W/CTA COR W/SCORE W/CA W/CM &/OR WO/CM   Basic metabolic panel   EKG 12-Lead   ECHOCARDIOGRAM COMPLETE   Meds ordered this encounter  Medications   metoprolol tartrate (LOPRESSOR) 100 MG tablet    Sig: Take 1 tablet by mouth once for procedure.    Dispense:  1 tablet    Refill:  0    Patient Instructions  Medication Instructions:  Take 100 mg Metoprolol two hours before CT when scheduled.   STOP Lisinopril-HCTZ  START Lisinopril 5 mg daily   *If you need a refill on your cardiac medications before your next appointment, please call your pharmacy*   Lab Work: BMET today   If you have labs (blood work) drawn today and your tests are completely normal, you will receive your results only by: MyChart Message (if you have MyChart) OR A paper copy in the mail If you have any lab test that is abnormal or we need to change your treatment, we will call you to review the results.   Testing/Procedures:  Echocardiogram - Your physician has requested that you have an echocardiogram. Echocardiography is a painless test  that uses sound waves to create images of your heart. It provides your doctor with information about the size and shape of your heart and how well your heart's chambers and valves are working. This procedure takes approximately one hour. There are no restrictions for this procedure.   Coronary CTA- they will call to get this scheduled.    Follow-Up: At [facility], you and your health needs are our priority.  As part of our continuing mission to provide you with exceptional heart care, we have created designated Provider Care Teams.  These Care Teams include your primary Cardiologist (physician) and Advanced Practice Providers (APPs -  Physician Assistants and Nurse Practitioners) who all work together to provide you with the care you need, when you need it.  We recommend signing up for the patient portal called "MyChart".  Sign up information is provided on this After Visit Summary.  MyChart is used to connect with patients for Virtual Visits (Telemedicine).  Patients are able to view lab/test results, encounter notes, upcoming appointments, etc.  Non-urgent messages can be sent to your provider as well.   To learn more about what you can do with MyChart, go to [email or web address].    Your next appointment:   As needed  Provider:   Lennie Odor, MD   Other Instructions   Your cardiac CT will be scheduled at one of the below locations:   [facility] [address] [place], [place] [address] [phone]  If scheduled at [facility], please  arrive at the [facility] and Children's Entrance (Entrance C2) of [facility] 30 minutes prior to test start time. You can use the FREE valet parking offered at entrance C (encouraged to control the heart rate for the test)  Proceed to the [facility] Radiology Department (first floor) to check-in and test prep.  All radiology patients and guests should use entrance C2 at [facility], accessed from  [facility], even though the hospital's physical address listed is [address].       Please follow these instructions carefully (unless otherwise directed):   On the Night Before the Test: Be sure to Drink plenty of water. Do not consume any caffeinated/decaffeinated beverages or chocolate 12 hours prior to your test. Do not take any antihistamines 12 hours prior to your test.  On the Day of the Test: Drink plenty of water until 1 hour prior to the test. Do not eat any food 1 hour prior to test. You may take your regular medications prior to the test.  Take metoprolol (Lopressor) two hours prior to test. If you take Furosemide/Hydrochlorothiazide/Spironolactone, please HOLD on the morning of the test. FEMALES- please wear underwire-free bra if available, avoid dresses & tight clothing     After the Test: Drink plenty of water. After receiving IV contrast, you may experience a mild flushed feeling. This is normal. On occasion, you may experience a mild rash up to 24 hours after the test. This is not dangerous. If this occurs, you can take Benadryl 25 mg and increase your fluid intake. If you experience trouble breathing, this can be serious. If it is severe call 911 IMMEDIATELY. If it is mild, please call our office. If you take any of these medications: Glipizide/Metformin, Avandament, Glucavance, please do not take 48 hours after completing test unless otherwise instructed.  We will call to schedule your test 2-4 weeks out understanding that some insurance companies will need an authorization prior to the service being performed.   For non-scheduling related questions, please contact the cardiac imaging nurse navigator should you have any questions/concerns: Rockwell [place], Cardiac Imaging Nurse Navigator Larey Brick, Cardiac Imaging Nurse Navigator [facility] Direct Office Dial: [phone]   For scheduling needs, including  cancellations and rescheduling, please call [place], [phone].     Signed, Lenna Gilford. Flora Lipps, MD, [facility]  [facility]  [address], [address] [place], [place] [address] [phone]  02/24/2023 2:19 PM

## 2023-02-24 ENCOUNTER — Encounter: Payer: Self-pay | Admitting: Cardiovascular Disease

## 2023-02-24 ENCOUNTER — Ambulatory Visit: Payer: Medicare HMO | Attending: Cardiovascular Disease | Admitting: Cardiovascular Disease

## 2023-02-24 VITALS — BP 118/62 | HR 71 | Ht 63.0 in | Wt 143.0 lb

## 2023-02-24 DIAGNOSIS — R072 Precordial pain: Secondary | ICD-10-CM

## 2023-02-24 DIAGNOSIS — I150 Renovascular hypertension: Secondary | ICD-10-CM

## 2023-02-24 DIAGNOSIS — R0602 Shortness of breath: Secondary | ICD-10-CM

## 2023-02-24 MED ORDER — METOPROLOL TARTRATE 100 MG PO TABS: ORAL_TABLET | ORAL | 0 refills | Status: DC

## 2023-02-24 NOTE — Patient Instructions (Signed)
Medication Instructions:  Take 100 mg Metoprolol two hours before CT when scheduled.   STOP Lisinopril-HCTZ  START Lisinopril 5 mg daily   *If you need a refill on your cardiac medications before your next appointment, please call your pharmacy*   Lab Work: BMET today   If you have labs (blood work) drawn today and your tests are completely normal, you will receive your results only by: [place] (if you have MyChart) OR A paper copy in the mail If you have any lab test that is abnormal or we need to change your treatment, we will call you to review the results.   Testing/Procedures:  Echocardiogram - Your physician has requested that you have an echocardiogram. Echocardiography is a painless test that uses sound waves to create images of your heart. It provides your doctor with information about the size and shape of your heart and how well your heart's chambers and valves are working. This procedure takes approximately one hour. There are no restrictions for this procedure.   Coronary CTA- they will call to get this scheduled.    Follow-Up: At [facility], you and your health needs are our priority.  As part of our continuing mission to provide you with exceptional heart care, we have created designated Provider Care Teams.  These Care Teams include your primary Cardiologist (physician) and Advanced Practice Providers (APPs -  Physician Assistants and Nurse Practitioners) who all work together to provide you with the care you need, when you need it.  We recommend signing up for the patient portal called "MyChart".  Sign up information is provided on this After Visit Summary.  MyChart is used to connect with patients for Virtual Visits (Telemedicine).  Patients are able to view lab/test results, encounter notes, upcoming appointments, etc.  Non-urgent messages can be sent to your provider as well.   To learn more about what you can do with MyChart, go to  [email or web address].    Your next appointment:   As needed  Provider:   Eleonore Chiquito, MD   Other Instructions   Your cardiac CT will be scheduled at one of the below locations:   [facility] [address] [place], [place] [address] [phone]  If scheduled at [facility], please arrive at the [facility] En[facility]ance (Entrance C2) of [facility] 30 minutes prior to test start time. You can use the FREE valet parking offered at entrance C (encouraged to control the heart rate for the test)  Proceed to the [facility] Radiology Department (first floor) to check-in and test prep.  All radiology patients and guests should use entrance C2 at [facility], accessed from [facility], even though the hospital's physical address listed is [address].       Please follow these instructions carefully (unless otherwise directed):   On the Night Before the Test: Be sure to Drink plenty of water. Do not consume any caffeinated/decaffeinated beverages or chocolate 12 hours prior to your test. Do not take any antihistamines 12 hours prior to your test.  On the Day of the Test: Drink plenty of water until 1 hour prior to the test. Do not eat any food 1 hour prior to test. You may take your regular medications prior to the test.  Take metoprolol (Lopressor) two hours prior to test. If you take Furosemide/Hydrochlorothiazide/Spironolactone, please HOLD on the morning of the test. FEMALES- please wear underwire-free bra if available, avoid dresses &  tight clothing     After the Test: Drink plenty of water. After receiving IV contrast, you may experience a mild flushed feeling. This is normal. On occasion, you may experience a mild rash up to 24 hours after the test. This is not dangerous. If this occurs, you can take Benadryl 25 mg and increase your fluid intake. If you experience trouble breathing, this can be  serious. If it is severe call 911 IMMEDIATELY. If it is mild, please call our office. If you take any of these medications: Glipizide/Metformin, Avandament, Glucavance, please do not take 48 hours after completing test unless otherwise instructed.  We will call to schedule your test 2-4 weeks out understanding that some insurance companies will need an authorization prior to the service being performed.   For non-scheduling related questions, please contact the cardiac imaging nurse navigator should you have any questions/concerns: Marchia Bond, Cardiac Imaging Nurse Navigator Gordy Clement, Cardiac Imaging Nurse Navigator [facility] Direct Office Dial: [phone]   For scheduling needs, including cancellations and rescheduling, please call [place], [phone].

## 2023-02-25 ENCOUNTER — Other Ambulatory Visit: Payer: Self-pay

## 2023-02-25 ENCOUNTER — Telehealth: Payer: Self-pay | Admitting: Cardiovascular Disease

## 2023-02-25 DIAGNOSIS — R0602 Shortness of breath: Secondary | ICD-10-CM

## 2023-02-25 DIAGNOSIS — J301 Allergic rhinitis due to pollen: Secondary | ICD-10-CM | POA: Diagnosis not present

## 2023-02-25 DIAGNOSIS — R072 Precordial pain: Secondary | ICD-10-CM

## 2023-02-25 DIAGNOSIS — J3081 Allergic rhinitis due to animal (cat) (dog) hair and dander: Secondary | ICD-10-CM | POA: Diagnosis not present

## 2023-02-25 DIAGNOSIS — J3089 Other allergic rhinitis: Secondary | ICD-10-CM | POA: Diagnosis not present

## 2023-02-25 LAB — BASIC METABOLIC PANEL
BUN/Creatinine Ratio: 18 (ref 12–28)
BUN: 29 mg/dL — ABNORMAL HIGH (ref 8–27)
CO2: 23 mmol/L (ref 20–29)
Calcium: 9.7 mg/dL (ref 8.7–10.3)
Chloride: 103 mmol/L (ref 96–106)
Creatinine, Ser: 1.62 mg/dL — ABNORMAL HIGH (ref 0.57–1.00)
Glucose: 106 mg/dL — ABNORMAL HIGH (ref 70–99)
Potassium: 4.7 mmol/L (ref 3.5–5.2)
Sodium: 143 mmol/L (ref 134–144)
eGFR: 33 mL/min/{1.73_m2} — ABNORMAL LOW (ref >59)

## 2023-02-25 MED ORDER — LISINOPRIL 5 MG PO TABS: 5.0000 mg | ORAL_TABLET | Freq: Every day | ORAL | 3 refills | Status: DC

## 2023-02-25 NOTE — Telephone Encounter (Signed)
Patient is returning call and is requesting return call.

## 2023-02-25 NOTE — Telephone Encounter (Signed)
Spoke with pt, the lisinopril that was prescribed by dr Audie Box yesterday was not sent to the pharmacy. Per [facility] prescription for lisinopril 5 mg sent to the pharmacy.

## 2023-02-25 NOTE — Addendum Note (Signed)
Addended by: Geralynn Rile on: 02/25/2023 09:20 AM   Modules accepted: Orders

## 2023-02-25 NOTE — Telephone Encounter (Signed)
Left message to call the clinic.

## 2023-02-25 NOTE — Telephone Encounter (Signed)
Pt c/o medication issue:  1. Name of Medication:    2. How are you currently taking this medication (dosage and times per day)?    3. Are you having a reaction (difficulty breathing--STAT)? no  4. What is your medication issue? Patient calling in with question about the medication that Dr. Nori Riis was going to change for her. Please advise

## 2023-02-27 ENCOUNTER — Other Ambulatory Visit: Payer: Self-pay | Admitting: Family Medicine

## 2023-02-27 DIAGNOSIS — Z1231 Encounter for screening mammogram for malignant neoplasm of breast: Secondary | ICD-10-CM

## 2023-03-04 DIAGNOSIS — J3089 Other allergic rhinitis: Secondary | ICD-10-CM | POA: Diagnosis not present

## 2023-03-04 DIAGNOSIS — J3081 Allergic rhinitis due to animal (cat) (dog) hair and dander: Secondary | ICD-10-CM | POA: Diagnosis not present

## 2023-03-04 DIAGNOSIS — J301 Allergic rhinitis due to pollen: Secondary | ICD-10-CM | POA: Diagnosis not present

## 2023-03-07 DIAGNOSIS — J301 Allergic rhinitis due to pollen: Secondary | ICD-10-CM | POA: Diagnosis not present

## 2023-03-07 DIAGNOSIS — J3081 Allergic rhinitis due to animal (cat) (dog) hair and dander: Secondary | ICD-10-CM | POA: Diagnosis not present

## 2023-03-07 DIAGNOSIS — J3089 Other allergic rhinitis: Secondary | ICD-10-CM | POA: Diagnosis not present

## 2023-03-10 ENCOUNTER — Ambulatory Visit (HOSPITAL_COMMUNITY): Payer: Medicare HMO

## 2023-03-20 DIAGNOSIS — J3089 Other allergic rhinitis: Secondary | ICD-10-CM | POA: Diagnosis not present

## 2023-03-20 DIAGNOSIS — J301 Allergic rhinitis due to pollen: Secondary | ICD-10-CM | POA: Diagnosis not present

## 2023-03-20 DIAGNOSIS — J3081 Allergic rhinitis due to animal (cat) (dog) hair and dander: Secondary | ICD-10-CM | POA: Diagnosis not present

## 2023-03-24 ENCOUNTER — Other Ambulatory Visit: Payer: Self-pay | Admitting: Family Medicine

## 2023-03-24 ENCOUNTER — Ambulatory Visit (HOSPITAL_COMMUNITY): Payer: Medicare HMO | Attending: Cardiovascular Disease

## 2023-03-24 DIAGNOSIS — R072 Precordial pain: Secondary | ICD-10-CM | POA: Insufficient documentation

## 2023-03-24 LAB — ECHOCARDIOGRAM COMPLETE
Area-P 1/2: 2.31 cm2
S' Lateral: 2.6 cm

## 2023-03-25 ENCOUNTER — Telehealth (HOSPITAL_COMMUNITY): Payer: Self-pay | Admitting: Emergency Medicine

## 2023-03-25 NOTE — Telephone Encounter (Signed)
Reaching out to patient to offer assistance regarding upcoming cardiac imaging study; pt verbalizes understanding of appt date/time, parking situation and where to check in, pre-test NPO status and medications ordered, and verified current allergies; name and call back number provided for further questions should they arise Morgan Bond RN Navigator Cardiac Imaging [facility] [phone] office [phone] cell  Arrival [address] main, radiology No food No caffeine Denies iv issues

## 2023-03-26 ENCOUNTER — Ambulatory Visit (HOSPITAL_COMMUNITY)
Admission: RE | Admit: 2023-03-26 | Discharge: 2023-03-26 | Disposition: A | Discharge status: Home / Self Care | Payer: Medicare HMO | Source: Ambulatory Visit | Attending: Cardiovascular Disease | Admitting: Cardiovascular Disease

## 2023-03-26 DIAGNOSIS — R072 Precordial pain: Secondary | ICD-10-CM | POA: Insufficient documentation

## 2023-03-26 DIAGNOSIS — R0602 Shortness of breath: Secondary | ICD-10-CM | POA: Insufficient documentation

## 2023-03-26 LAB — NM PET CT CARDIAC PERFUSION MULTI W/ABSOLUTE BLOODFLOW
MBFR: 2.16
Nuc Rest EF: 56 %
Nuc Stress EF: 72 %
Rest MBF: 0.98 ml/g/min
Rest Nuclear Isotope Dose: 16.9 mCi
ST Depression (mm): 0 mm
Stress MBF: 2.12 ml/g/min
Stress Nuclear Isotope Dose: 17 mCi
TID: 0.91

## 2023-03-26 MED ORDER — RUBIDIUM RB82 GENERATOR (RUBYFILL)
17.0000 | PACK | Freq: Once | INTRAVENOUS | Status: AC
  Administered 2023-03-26: 17 via INTRAVENOUS

## 2023-03-26 MED ORDER — REGADENOSON 0.4 MG/5ML IV SOLN: 0.4000 mg | Freq: Once | INTRAVENOUS | Status: AC

## 2023-03-26 MED ORDER — REGADENOSON 0.4 MG/5ML IV SOLN
INTRAVENOUS | Status: AC
  Administered 2023-03-26: 0.4 mg via INTRAVENOUS
  Filled 2023-03-26: qty 5

## 2023-03-26 MED ORDER — RUBIDIUM RB82 GENERATOR (RUBYFILL): 16.9000 | PACK | Freq: Once | INTRAVENOUS | Status: DC

## 2023-03-27 ENCOUNTER — Encounter: Payer: Self-pay | Admitting: Cardiovascular Disease

## 2023-03-27 DIAGNOSIS — J3089 Other allergic rhinitis: Secondary | ICD-10-CM | POA: Diagnosis not present

## 2023-03-27 DIAGNOSIS — J301 Allergic rhinitis due to pollen: Secondary | ICD-10-CM | POA: Diagnosis not present

## 2023-03-27 DIAGNOSIS — J3081 Allergic rhinitis due to animal (cat) (dog) hair and dander: Secondary | ICD-10-CM | POA: Diagnosis not present

## 2023-03-27 NOTE — Telephone Encounter (Signed)
Error

## 2023-04-03 DIAGNOSIS — J3089 Other allergic rhinitis: Secondary | ICD-10-CM | POA: Diagnosis not present

## 2023-04-03 DIAGNOSIS — J301 Allergic rhinitis due to pollen: Secondary | ICD-10-CM | POA: Diagnosis not present

## 2023-04-03 DIAGNOSIS — J3081 Allergic rhinitis due to animal (cat) (dog) hair and dander: Secondary | ICD-10-CM | POA: Diagnosis not present

## 2023-04-10 ENCOUNTER — Ambulatory Visit
Admission: RE | Admit: 2023-04-10 | Discharge: 2023-04-10 | Disposition: A | Discharge status: Home / Self Care | Payer: Medicare HMO | Source: Ambulatory Visit | Attending: Family Medicine | Admitting: Family Medicine

## 2023-04-10 DIAGNOSIS — Z1231 Encounter for screening mammogram for malignant neoplasm of breast: Secondary | ICD-10-CM

## 2023-04-11 DIAGNOSIS — J3089 Other allergic rhinitis: Secondary | ICD-10-CM | POA: Diagnosis not present

## 2023-04-11 DIAGNOSIS — J301 Allergic rhinitis due to pollen: Secondary | ICD-10-CM | POA: Diagnosis not present

## 2023-04-11 DIAGNOSIS — J3081 Allergic rhinitis due to animal (cat) (dog) hair and dander: Secondary | ICD-10-CM | POA: Diagnosis not present

## 2023-04-18 DIAGNOSIS — J301 Allergic rhinitis due to pollen: Secondary | ICD-10-CM | POA: Diagnosis not present

## 2023-04-18 DIAGNOSIS — J3081 Allergic rhinitis due to animal (cat) (dog) hair and dander: Secondary | ICD-10-CM | POA: Diagnosis not present

## 2023-04-18 DIAGNOSIS — J3089 Other allergic rhinitis: Secondary | ICD-10-CM | POA: Diagnosis not present

## 2023-04-25 DIAGNOSIS — J3089 Other allergic rhinitis: Secondary | ICD-10-CM | POA: Diagnosis not present

## 2023-04-25 DIAGNOSIS — J301 Allergic rhinitis due to pollen: Secondary | ICD-10-CM | POA: Diagnosis not present

## 2023-04-25 DIAGNOSIS — J3081 Allergic rhinitis due to animal (cat) (dog) hair and dander: Secondary | ICD-10-CM | POA: Diagnosis not present

## 2023-05-01 DIAGNOSIS — J3089 Other allergic rhinitis: Secondary | ICD-10-CM | POA: Diagnosis not present

## 2023-05-01 DIAGNOSIS — J301 Allergic rhinitis due to pollen: Secondary | ICD-10-CM | POA: Diagnosis not present

## 2023-05-01 DIAGNOSIS — J3081 Allergic rhinitis due to animal (cat) (dog) hair and dander: Secondary | ICD-10-CM | POA: Diagnosis not present

## 2023-05-08 ENCOUNTER — Ambulatory Visit (HOSPITAL_COMMUNITY)
Admission: RE | Admit: 2023-05-08 | Discharge: 2023-05-08 | Disposition: A | Discharge status: Home / Self Care | Payer: Medicare HMO | Source: Ambulatory Visit | Attending: Urology | Admitting: Urology

## 2023-05-08 ENCOUNTER — Other Ambulatory Visit (HOSPITAL_COMMUNITY): Payer: Self-pay | Admitting: Urology

## 2023-05-08 DIAGNOSIS — C641 Malignant neoplasm of right kidney, except renal pelvis: Secondary | ICD-10-CM | POA: Diagnosis not present

## 2023-05-08 DIAGNOSIS — Z85528 Personal history of other malignant neoplasm of kidney: Secondary | ICD-10-CM | POA: Diagnosis not present

## 2023-05-08 DIAGNOSIS — R079 Chest pain, unspecified: Secondary | ICD-10-CM | POA: Diagnosis not present

## 2023-05-09 DIAGNOSIS — J3089 Other allergic rhinitis: Secondary | ICD-10-CM | POA: Diagnosis not present

## 2023-05-09 DIAGNOSIS — J301 Allergic rhinitis due to pollen: Secondary | ICD-10-CM | POA: Diagnosis not present

## 2023-05-09 DIAGNOSIS — J3081 Allergic rhinitis due to animal (cat) (dog) hair and dander: Secondary | ICD-10-CM | POA: Diagnosis not present

## 2023-05-14 DIAGNOSIS — C641 Malignant neoplasm of right kidney, except renal pelvis: Secondary | ICD-10-CM | POA: Diagnosis not present

## 2023-05-14 DIAGNOSIS — J3089 Other allergic rhinitis: Secondary | ICD-10-CM | POA: Diagnosis not present

## 2023-05-14 DIAGNOSIS — J3081 Allergic rhinitis due to animal (cat) (dog) hair and dander: Secondary | ICD-10-CM | POA: Diagnosis not present

## 2023-05-14 DIAGNOSIS — J301 Allergic rhinitis due to pollen: Secondary | ICD-10-CM | POA: Diagnosis not present

## 2023-05-15 DIAGNOSIS — C641 Malignant neoplasm of right kidney, except renal pelvis: Secondary | ICD-10-CM | POA: Diagnosis not present

## 2023-05-19 ENCOUNTER — Ambulatory Visit (INDEPENDENT_AMBULATORY_CARE_PROVIDER_SITE_OTHER): Payer: Medicare HMO

## 2023-05-19 VITALS — BP 110/62 | HR 86 | Temp 97.7°F | Wt 142.4 lb

## 2023-05-19 DIAGNOSIS — Z0000 Encounter for general adult medical examination without abnormal findings: Secondary | ICD-10-CM | POA: Diagnosis not present

## 2023-05-19 NOTE — Progress Notes (Signed)
Subjective:   Morgan [name] is a 72 y.o. female who presents for Medicare Annual (Subsequent) preventive examination.  Review of Systems     Cardiac Risk Factors include: advanced age (>30men, >66 women);hypertension;dyslipidemia     Objective:    Today's Vitals   05/19/23 1358  BP: 110/62  Pulse: 86  Temp: 97.7 F (36.5 C)  SpO2: 98%  Weight: 142 lb 6.4 oz (64.6 kg)   Body mass index is 25.23 kg/m.     05/19/2023    2:10 PM 12/31/2022    3:00 PM 12/20/2022    2:07 PM 05/06/2022    2:46 PM 11/13/2021    9:15 PM 04/30/2021    2:43 PM 01/04/2021   11:53 AM  Advanced Directives  Does Patient Have a Medical Advance Directive? Yes Yes Yes Yes Yes Yes Yes  Type of Estate agent of [place];Living will Living will;Healthcare Power of Attorney Living will;Healthcare Power of [employer] Power of [employer] Power of [place];Living will  Healthcare Power of [place];Living will  Does patient want to make changes to medical advance directive?  No - Patient declined   No - Patient declined Yes (MAU/Ambulatory/Procedural Areas - Information given) Yes (MAU/Ambulatory/Procedural Areas - Information given)  Copy of Healthcare Power of Attorney in Chart? No - copy requested   No - copy requested No - copy requested      Current Medications (verified) Outpatient Encounter Medications as of 05/19/2023  Medication Sig   atorvastatin (LIPITOR) 10 MG tablet TAKE 1 TABLET BY MOUTH EVERY DAY   azelastine (ASTELIN) 0.1 % nasal spray Place 2 sprays into both nostrils daily as needed for allergies.   diazepam (VALIUM) 5 MG tablet Take 0.5-1 tablets (2.5-5 mg total) by mouth at bedtime as needed for sedation.   DULoxetine (CYMBALTA) 30 MG capsule TAKE ONE CAPSULE BY MOUTH ONE TIME DAILY   levothyroxine (SYNTHROID) 50 MCG tablet TAKE 1 TABLET (50 MCG TOTAL) BY MOUTH DAILY BEFORE BREAKFAST. EVERY TUESDAY AND THURSDAY   levothyroxine (SYNTHROID) 75 MCG tablet  Take 75 mcg by mouth on Monday, Wednesday, Friday, Saturday and Sunday   lisinopril (ZESTRIL) 5 MG tablet Take 1 tablet (5 mg total) by mouth daily.   metoprolol tartrate (LOPRESSOR) 100 MG tablet Take 1 tablet by mouth once for procedure.   valACYclovir (VALTREX) 1000 MG tablet TAKE 2 TABLETS BY MOUTH TWICE A DAY ONCE AS NEEDED FOR COLD SORES   docusate sodium (COLACE) 100 MG capsule Take 1 capsule (100 mg total) by mouth 2 (two) times daily. (Patient not taking: Reported on 02/24/2023)   EPINEPHrine 0.3 mg/0.3 mL IJ SOAJ injection Inject 0.3 mg into the muscle as needed for anaphylaxis. On hand   No facility-administered encounter medications on file as of 05/19/2023.    Allergies (verified) Simvastatin and Sulfonamide derivatives   History: Past Medical History:  Diagnosis Date   Allergy    Anxiety and depression 02/22/2014   Arthritis    Cancer (HCC) 10/2022   right kidney   Depression    Diastolic dysfunction    Negative Nuclear Stress Test 08/09/12.   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 Wynona Neat pulm   OSA (obstructive sleep apnea)    Mild. No CPAP.   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   Seasonal allergies    Sleep apnea    mild not on CPAP   Tinnitus    Past Surgical History:  Procedure Laterality Date   COLONOSCOPY     POLYPECTOMY     Colon   REPLACEMENT TOTAL KNEE Right    ROBOT ASSISTED LAPAROSCOPIC NEPHRECTOMY Right 12/31/2022   Procedure: XI ROBOTIC ASSISTED LAPAROSCOPIC NEPHRECTOMY;  Surgeon: Rene Paci, MD;  Location: [facility];  Service: Urology;  Laterality: Right;  3 HRS   TOTAL KNEE ARTHROPLASTY Right 04/22/2007   TOTAL KNEE ARTHROPLASTY  11/30/2012   Procedure: TOTAL KNEE ARTHROPLASTY;  Surgeon: Loanne Drilling, MD;   Location: [facility];  Service: Orthopedics;  Laterality: Left;   Family History  Problem Relation Age of Onset   Macular degeneration Mother    Heart attack Father    Hyperlipidemia Father    Heart disease Father    Diabetes Father    Brain cancer Father    Diabetes Brother    Breast cancer Maternal Aunt    Breast cancer Paternal Aunt    Diabetes Paternal Grandfather    Migraines Daughter    Pulmonary embolism Daughter    Arthritis Other    Colon polyps Neg Hx    Colon cancer Neg Hx    Esophageal cancer Neg Hx    Rectal cancer Neg Hx    Stomach cancer Neg Hx    Crohn's disease Neg Hx    Ulcerative colitis Neg Hx    Social History   Socioeconomic History   Marital status: Widowed    Spouse name: Not on file   Number of children: 1   Years of education: Not on file   Highest education level: Not on file  Occupational History    Comment: retired    Occupation: Retired Photographer  Tobacco Use   Smoking status: Never    Passive exposure: Never   Smokeless tobacco: Never  Vaping Use   Vaping Use: Never used  Substance and Sexual Activity   Alcohol use: Yes    Comment: rare   Drug use: No   Sexual activity: Yes    Comment: lives with husband, 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 No major dietary restrictions  Other Topics Concern   Not on file  Social History Narrative   Not on file   Social Determinants of Health   Financial Resource Strain: Low Risk  (05/19/2023)   Overall Financial Resource Strain (CARDIA)    Difficulty of Paying Living Expenses: Not hard at all  Food Insecurity: No Food Insecurity (05/19/2023)   Hunger Vital Sign    Worried About Running Out of Food in the Last Year: Never true    Ran Out of Food in the Last Year: Never true  Transportation Needs: No Transportation Needs (05/19/2023)   PRAPARE - Administrator, Civil Service (Medical): No    Lack of Transportation (Non-Medical): No  Physical Activity: Insufficiently Active (05/19/2023)    Exercise Vital Sign    Days of Exercise per Week: 2 days    Minutes of Exercise per Session: 20 min  Stress: Stress Concern Present (05/19/2023)   [employer] of Occupational Health - Occupational Stress Questionnaire    Feeling of Stress : Rather much  Social Connections: Moderately Isolated (05/19/2023)   Social Connection and Isolation Panel [NHANES]    Frequency of Communication with Friends and Family: More than three times a week    Frequency of Social  Gatherings with Friends and Family: More than three times a week    Attends Religious Services: Never    Database administrator or Organizations: Yes    Attends Banker Meetings: 1 to 4 times per year    Marital Status: Widowed    Tobacco Counseling Counseling given: Not Answered   Clinical Intake:  Pre-visit preparation completed: Yes  Pain : No/denies pain     BMI - recorded: 25.23 Nutritional Status: BMI 25 -29 Overweight Nutritional Risks: None Diabetes: No  How often do you need to have someone help you when you read instructions, pamphlets, or other written materials from your doctor or pharmacy?: 1 - Never  Diabetic?no  Interpreter Needed?: No  Information entered by :: Lanier Ensign, LPN   Activities of Daily Living    05/19/2023    2:11 PM 12/31/2022    3:00 PM  In your present state of health, do you have any difficulty performing the following activities:  Hearing? 1 0  Comment tinnitus   Vision? 0 0  Difficulty concentrating or making decisions? 0 0  Walking or climbing stairs? 0 0  Dressing or bathing? 0 0  Doing errands, shopping? 0 0  Preparing Food and eating ? N   Using the Toilet? N   In the past six months, have you accidently leaked urine? N   Do you have problems with loss of bowel control? N   Managing your Medications? N   Managing your Finances? N   Housekeeping or managing your Housekeeping? N     Patient Care Team: Willow Ora, MD as PCP - General  (Family Medicine) Meryl Dare, MD as Consulting Physician (Gastroenterology) Aris Lot, MD as Consulting Physician (Dermatology) Elliot Cousin, OD as Consulting Physician (Optometry) Sidney Ace, MD as Referring Physician (Allergy) Tomma Lightning, MD as Consulting Physician (Pulmonary Disease) Alfredo Martinez, MD as Consulting Physician (Urology) Meryl Dare, MD as Consulting Physician (Gastroenterology) Rene Paci, MD as Consulting Physician (Urology) O'Neal, Ronnald Ramp, MD as Consulting Physician (Cardiology)  Indicate any recent Medical Services you may have received from other than [facility] providers in the past year (date may be approximate).     Assessment:   This is a routine wellness examination for [facility].  Hearing/Vision screen Hearing Screening - Comments:: Pt has tinnitus  Vision Screening - Comments:: Pt follows up with Dr Burundi for annual eye exams   Dietary issues and exercise activities discussed: Current Exercise Habits: Home exercise routine, Type of exercise: walking, Time (Minutes): 20, Frequency (Times/Week): 2, Weekly Exercise (Minutes/Week): 40   Goals Addressed             This Visit's Progress    Patient Stated       Exercise more        Depression Screen    05/19/2023    2:07 PM 02/07/2023   10:53 AM 12/05/2022   11:27 AM 06/27/2022    9:54 AM 05/06/2022    2:43 PM 11/13/2021   11:22 AM 04/30/2021    2:41 PM  PHQ 2/9 Scores  PHQ - 2 Score 0 3 0 4 1 1 1   PHQ- 9 Score  8  9  2      Fall Risk    05/19/2023    2:10 PM 02/07/2023   10:53 AM 12/05/2022   11:27 AM 06/27/2022    9:50 AM 05/06/2022    2:47 PM  Fall Risk   Falls in the past year?  0 0 0 0 0  Number falls in past yr: 0 0 0 0 0  Injury with Fall? 0 0 0 0 0  Risk for fall due to : Impaired vision No Fall Risks No Fall Risks No Fall Risks Impaired vision  Follow up Falls prevention discussed Falls evaluation completed Falls evaluation completed Falls  evaluation completed Falls prevention discussed    FALL RISK PREVENTION PERTAINING TO THE HOME:  Any stairs in or around the home? Yes  If so, are there any without handrails? No  Home free of loose throw rugs in walkways, pet beds, electrical cords, etc? Yes  Adequate lighting in your home to reduce risk of falls? Yes   ASSISTIVE DEVICES UTILIZED TO PREVENT FALLS:  Life alert? Yes  Use of a cane, walker or w/c? No  Grab bars in the bathroom? No  Shower chair or bench in shower? No  Elevated toilet seat or a handicapped toilet? No   TIMED UP AND GO:  Was the test performed? Yes .  Length of time to ambulate 10 feet: 10 sec.   Gait steady and fast without use of assistive device  Cognitive Function:        05/19/2023    2:12 PM 05/06/2022    2:49 PM 04/30/2021    2:48 PM 01/11/2020    2:48 PM  6CIT Screen  What Year? 0 points 0 points 0 points 0 points  What month? 0 points 0 points 0 points 0 points  What time? 0 points 0 points  0 points  Count back from 20 0 points 0 points 0 points 0 points  Months in reverse 0 points 0 points 0 points 0 points  Repeat phrase 0 points 0 points 0 points 0 points  Total Score 0 points 0 points  0 points    Immunizations Immunization History  Administered Date(s) Administered   Fluad Quad(high Dose 65+) 09/03/2019, 09/17/2020, 09/20/2022   Influenza Split 11/29/2011, 10/23/2012   Influenza Whole 09/30/2008, 11/10/2010   Influenza, High Dose Seasonal PF 09/15/2017, 09/14/2018, 09/15/2021, 10/03/2021, 02/08/2022   Influenza,inj,Quad PF,6+ Mos 10/01/2013, 09/13/2014, 10/03/2015, 10/30/2016   PFIZER Comirnaty(Gray Top)Covid-19 Tri-Sucrose Vaccine 10/01/2022   PFIZER(Purple Top)SARS-COV-2 Vaccination 02/07/2020, 03/03/2020, 11/02/2020, 10/03/2021   Pfizer Covid-19 Vaccine Bivalent Booster 49yrs & up 10/31/2021   Pneumococcal Conjugate-13 10/22/2013   Pneumococcal Polysaccharide-23 03/16/2015, 10/03/2021, 02/08/2022   Respiratory  Syncytial Virus Vaccine,Recomb Aduvanted(Arexvy) 11/08/2022   Td 12/30/2005, 01/19/2016   Zoster Recombinat (Shingrix) 01/03/2020, 05/04/2020   Zoster, Live 08/02/2011    TDAP status: Up to date  Flu Vaccine status: Up to date  Pneumococcal vaccine status: Up to date  Covid-19 vaccine status: Completed vaccines  Qualifies for Shingles Vaccine? Yes   Zostavax completed Yes   Shingrix Completed?: Yes  Screening Tests Health Maintenance  Topic Date Due   COVID-19 Vaccine (6 - 2023-24 season) 11/26/2022   INFLUENZA VACCINE  07/31/2023   MAMMOGRAM  04/09/2024   DEXA SCAN  04/29/2024   Medicare Annual Wellness (AWV)  05/18/2024   DTaP/Tdap/Td (3 - Tdap) 01/18/2026   COLONOSCOPY (Pts 45-34yrs Insurance coverage will need to be confirmed)  12/12/2027   Pneumonia Vaccine 54+ Years old  Completed   Hepatitis C Screening  Completed   Zoster Vaccines- Shingrix  Completed   HPV VACCINES  Aged Out    Health Maintenance  Health Maintenance Due  Topic Date Due   COVID-19 Vaccine (6 - 2023-24 season) 11/26/2022    Colorectal cancer screening: Type of screening:  Colonoscopy. Completed 12/11/22. Repeat every 5 years  Mammogram status: Completed 04/10/23. Repeat every year  Bone Density status: Completed 04/29/22. Results reflect: Bone density results: OSTEOPOROSIS. Repeat every 2 years.   Additional Screening:  Hepatitis C Screening: Completed 01/19/16  Vision Screening: Recommended annual ophthalmology exams for early detection of glaucoma and other disorders of the eye. Is the patient up to date with their annual eye exam?  No  Who is the provider or what is the name of the office in which the patient attends annual eye exams? Dr Burund[place]  If pt is not established with a provider, would they like to be referred to a provider to establish care? No .   Dental Screening: Recommended annual dental exams for proper oral hygiene  Community Resource Referral / Chronic Care Management: CRR  required this visit?  No   CCM required this visit?  No      Plan:     I have personally reviewed and noted the following in the patient's chart:   Medical and social history Use of alcohol, tobacco or illicit drugs  Current medications and supplements including opioid prescriptions. Patient is not currently taking opioid prescriptions. Functional ability and status Nutritional status Physical activity Advanced directives List of other physicians Hospitalizations, surgeries, and ER visits in previous 12 months Vitals Screenings to include cognitive, depression, and falls Referrals and appointments  In addition, I have reviewed and discussed with patient certain preventive protocols, quality metrics, and best practice recommendations. A written personalized care plan for preventive services as well as general preventive health recommendations were provided to patient.     Marzella Schlein, LPN   03/06/6577   Nurse Notes: none

## 2023-05-19 NOTE — Patient Instructions (Signed)
Morgan [name] , Thank you for taking time to come for your Medicare Wellness Visit. I appreciate your ongoing commitment to your health goals. Please review the following plan we discussed and let me know if I can assist you in the future.   These are the goals we discussed:  Goals      Patient Stated     Get more exercise in     Patient Stated     Get in more exercise         This is a list of the screening recommended for you and due dates:  Health Maintenance  Topic Date Due   COVID-19 Vaccine (6 - 2023-24 season) 11/26/2022   Flu Shot  07/31/2023   Mammogram  04/09/2024   DEXA scan (bone density measurement)  04/29/2024   Medicare Annual Wellness Visit  05/18/2024   DTaP/Tdap/Td vaccine (3 - Tdap) 01/18/2026   Colon Cancer Screening  12/12/2027   Pneumonia Vaccine  Completed   Hepatitis C Screening: USPSTF Recommendation to screen - Ages 18-79 yo.  Completed   Zoster (Shingles) Vaccine  Completed   HPV Vaccine  Aged Out    Advanced directives: Please bring a copy of your health care power of attorney and living will to the office at your convenience.  Conditions/risks identified: exercise more   Next appointment: Follow up in one year for your annual wellness visit    Preventive Care 65 Years and Older, Female Preventive care refers to lifestyle choices and visits with your health care provider that can promote health and wellness. What does preventive care include? A yearly physical exam. This is also called an annual well check. Dental exams once or twice a year. Routine eye exams. Ask your health care provider how often you should have your eyes checked. Personal lifestyle choices, including: Daily care of your teeth and gums. Regular physical activity. Eating a healthy diet. Avoiding tobacco and drug use. Limiting alcohol use. Practicing safe sex. Taking low-dose aspirin every day. Taking vitamin and mineral supplements as recommended by your health care  provider. What happens during an annual well check? The services and screenings done by your health care provider during your annual well check will depend on your age, overall health, lifestyle risk factors, and family history of disease. Counseling  Your health care provider may ask you questions about your: Alcohol use. Tobacco use. Drug use. Emotional well-being. Home and relationship well-being. Sexual activity. Eating habits. History of falls. Memory and ability to understand (cognition). Work and work Astronomer. Reproductive health. Screening  You may have the following tests or measurements: Height, weight, and BMI. Blood pressure. Lipid and cholesterol levels. These may be checked every 5 years, or more frequently if you are over 77 years old. Skin check. Lung cancer screening. You may have this screening every year starting at age 45 if you have a 30-pack-year history of smoking and currently smoke or have quit within the past 15 years. Fecal occult blood test (FOBT) of the stool. You may have this test every year starting at age 80. Flexible sigmoidoscopy or colonoscopy. You may have a sigmoidoscopy every 5 years or a colonoscopy every 10 years starting at age 12. Hepatitis C blood test. Hepatitis B blood test. Sexually transmitted disease (STD) testing. Diabetes screening. This is done by checking your blood sugar (glucose) after you have not eaten for a while (fasting). You may have this done every 1-3 years. Bone density scan. This is done to screen for  osteoporosis. You may have this done starting at age 68. Mammogram. This may be done every 1-2 years. Talk to your health care provider about how often you should have regular mammograms. Talk with your health care provider about your test results, treatment options, and if necessary, the need for more tests. Vaccines  Your health care provider may recommend certain vaccines, such as: Influenza vaccine. This is  recommended every year. Tetanus, diphtheria, and acellular pertussis (Tdap, Td) vaccine. You may need a Td booster every 10 years. Zoster vaccine. You may need this after age 36. Pneumococcal 13-valent conjugate (PCV13) vaccine. One dose is recommended after age 30. Pneumococcal polysaccharide (PPSV23) vaccine. One dose is recommended after age 22. Talk to your health care provider about which screenings and vaccines you need and how often you need them. This information is not intended to replace advice given to you by your health care provider. Make sure you discuss any questions you have with your health care provider. Document Released: 01/12/2016 Document Revised: 09/04/2016 Document Reviewed: 10/17/2015 Elsevier Interactive Patient Education  2017 [employer].  Fall Prevention in the Home Falls can cause injuries. They can happen to people of all ages. There are many things you can do to make your home safe and to help prevent falls. What can I do on the outside of my home? Regularly fix the edges of walkways and driveways and fix any cracks. Remove anything that might make you trip as you walk through a door, such as a raised step or threshold. Trim any bushes or trees on the path to your home. Use bright outdoor lighting. Clear any walking paths of anything that might make someone trip, such as rocks or tools. Regularly check to see if handrails are loose or broken. Make sure that both sides of any steps have handrails. Any raised decks and porches should have guardrails on the edges. Have any leaves, snow, or ice cleared regularly. Use sand or salt on walking paths during winter. Clean up any spills in your garage right away. This includes oil or grease spills. What can I do in the bathroom? Use night lights. Install grab bars by the toilet and in the tub and shower. Do not use towel bars as grab bars. Use non-skid mats or decals in the tub or shower. If you need to sit down in  the shower, use a plastic, non-slip stool. Keep the floor dry. Clean up any water that spills on the floor as soon as it happens. Remove soap buildup in the tub or shower regularly. Attach bath mats securely with double-sided non-slip rug tape. Do not have throw rugs and other things on the floor that can make you trip. What can I do in the bedroom? Use night lights. Make sure that you have a light by your bed that is easy to reach. Do not use any sheets or blankets that are too big for your bed. They should not hang down onto the floor. Have a firm chair that has side arms. You can use this for support while you get dressed. Do not have throw rugs and other things on the floor that can make you trip. What can I do in the kitchen? Clean up any spills right away. Avoid walking on wet floors. Keep items that you use a lot in easy-to-reach places. If you need to reach something above you, use a strong step stool that has a grab bar. Keep electrical cords out of the way. Do not  use floor polish or wax that makes floors slippery. If you must use wax, use non-skid floor wax. Do not have throw rugs and other things on the floor that can make you trip. What can I do with my stairs? Do not leave any items on the stairs. Make sure that there are handrails on both sides of the stairs and use them. Fix handrails that are broken or loose. Make sure that handrails are as long as the stairways. Check any carpeting to make sure that it is firmly attached to the stairs. Fix any carpet that is loose or worn. Avoid having throw rugs at the top or bottom of the stairs. If you do have throw rugs, attach them to the floor with carpet tape. Make sure that you have a light switch at the top of the stairs and the bottom of the stairs. If you do not have them, ask someone to add them for you. What else can I do to help prevent falls? Wear shoes that: Do not have high heels. Have rubber bottoms. Are comfortable  and fit you well. Are closed at the toe. Do not wear sandals. If you use a stepladder: Make sure that it is fully opened. Do not climb a closed stepladder. Make sure that both sides of the stepladder are locked into place. Ask someone to hold it for you, if possible. Clearly mark and make sure that you can see: Any grab bars or handrails. First and last steps. Where the edge of each step is. Use tools that help you move around (mobility aids) if they are needed. These include: Canes. Walkers. Scooters. Crutches. Turn on the lights when you go into a dark area. Replace any light bulbs as soon as they burn out. Set up your furniture so you have a clear path. Avoid moving your furniture around. If any of your floors are uneven, fix them. If there are any pets around you, be aware of where they are. Review your medicines with your doctor. Some medicines can make you feel dizzy. This can increase your chance of falling. Ask your doctor what other things that you can do to help prevent falls. This information is not intended to replace advice given to you by your health care provider. Make sure you discuss any questions you have with your health care provider. Document Released: 10/12/2009 Document Revised: 05/23/2016 Document Reviewed: 01/20/2015 Elsevier Interactive Patient Education  2017 [employer].

## 2023-05-20 DIAGNOSIS — J301 Allergic rhinitis due to pollen: Secondary | ICD-10-CM | POA: Diagnosis not present

## 2023-05-20 DIAGNOSIS — J3081 Allergic rhinitis due to animal (cat) (dog) hair and dander: Secondary | ICD-10-CM | POA: Diagnosis not present

## 2023-05-20 DIAGNOSIS — J3089 Other allergic rhinitis: Secondary | ICD-10-CM | POA: Diagnosis not present

## 2023-05-27 DIAGNOSIS — J301 Allergic rhinitis due to pollen: Secondary | ICD-10-CM | POA: Diagnosis not present

## 2023-05-27 DIAGNOSIS — J3089 Other allergic rhinitis: Secondary | ICD-10-CM | POA: Diagnosis not present

## 2023-05-27 DIAGNOSIS — J3081 Allergic rhinitis due to animal (cat) (dog) hair and dander: Secondary | ICD-10-CM | POA: Diagnosis not present

## 2023-06-04 DIAGNOSIS — J3081 Allergic rhinitis due to animal (cat) (dog) hair and dander: Secondary | ICD-10-CM | POA: Diagnosis not present

## 2023-06-04 DIAGNOSIS — J301 Allergic rhinitis due to pollen: Secondary | ICD-10-CM | POA: Diagnosis not present

## 2023-06-04 DIAGNOSIS — J3089 Other allergic rhinitis: Secondary | ICD-10-CM | POA: Diagnosis not present

## 2023-06-10 DIAGNOSIS — J3081 Allergic rhinitis due to animal (cat) (dog) hair and dander: Secondary | ICD-10-CM | POA: Diagnosis not present

## 2023-06-10 DIAGNOSIS — J301 Allergic rhinitis due to pollen: Secondary | ICD-10-CM | POA: Diagnosis not present

## 2023-06-10 DIAGNOSIS — J3089 Other allergic rhinitis: Secondary | ICD-10-CM | POA: Diagnosis not present

## 2023-06-16 ENCOUNTER — Other Ambulatory Visit: Payer: Self-pay | Admitting: Family Medicine

## 2023-06-17 DIAGNOSIS — J3081 Allergic rhinitis due to animal (cat) (dog) hair and dander: Secondary | ICD-10-CM | POA: Diagnosis not present

## 2023-06-17 DIAGNOSIS — J3089 Other allergic rhinitis: Secondary | ICD-10-CM | POA: Diagnosis not present

## 2023-06-17 DIAGNOSIS — J301 Allergic rhinitis due to pollen: Secondary | ICD-10-CM | POA: Diagnosis not present

## 2023-06-24 ENCOUNTER — Ambulatory Visit: Payer: Medicare HMO | Admitting: Cardiovascular Disease

## 2023-07-07 DIAGNOSIS — J3089 Other allergic rhinitis: Secondary | ICD-10-CM | POA: Diagnosis not present

## 2023-07-07 DIAGNOSIS — J301 Allergic rhinitis due to pollen: Secondary | ICD-10-CM | POA: Diagnosis not present

## 2023-07-07 DIAGNOSIS — J3081 Allergic rhinitis due to animal (cat) (dog) hair and dander: Secondary | ICD-10-CM | POA: Diagnosis not present

## 2023-07-11 ENCOUNTER — Encounter: Payer: Self-pay | Admitting: Family Medicine

## 2023-07-11 ENCOUNTER — Ambulatory Visit (INDEPENDENT_AMBULATORY_CARE_PROVIDER_SITE_OTHER): Payer: Medicare HMO | Admitting: Family Medicine

## 2023-07-11 VITALS — BP 114/60 | HR 75 | Temp 97.8°F | Ht 63.0 in | Wt 142.0 lb

## 2023-07-11 DIAGNOSIS — F5102 Adjustment insomnia: Secondary | ICD-10-CM

## 2023-07-11 DIAGNOSIS — E039 Hypothyroidism, unspecified: Secondary | ICD-10-CM | POA: Diagnosis not present

## 2023-07-11 DIAGNOSIS — C641 Malignant neoplasm of right kidney, except renal pelvis: Secondary | ICD-10-CM

## 2023-07-11 DIAGNOSIS — K635 Polyp of colon: Secondary | ICD-10-CM

## 2023-07-11 DIAGNOSIS — Z0000 Encounter for general adult medical examination without abnormal findings: Secondary | ICD-10-CM

## 2023-07-11 DIAGNOSIS — E782 Mixed hyperlipidemia: Secondary | ICD-10-CM

## 2023-07-11 DIAGNOSIS — Z0001 Encounter for general adult medical examination with abnormal findings: Secondary | ICD-10-CM

## 2023-07-11 DIAGNOSIS — I10 Essential (primary) hypertension: Secondary | ICD-10-CM | POA: Diagnosis not present

## 2023-07-11 DIAGNOSIS — F339 Major depressive disorder, recurrent, unspecified: Secondary | ICD-10-CM | POA: Diagnosis not present

## 2023-07-11 DIAGNOSIS — M810 Age-related osteoporosis without current pathological fracture: Secondary | ICD-10-CM | POA: Diagnosis not present

## 2023-07-11 DIAGNOSIS — N179 Acute kidney failure, unspecified: Secondary | ICD-10-CM

## 2023-07-11 DIAGNOSIS — R079 Chest pain, unspecified: Secondary | ICD-10-CM

## 2023-07-11 LAB — VITAMIN D 25 HYDROXY (VIT D DEFICIENCY, FRACTURES): VITD: 32.1 ng/mL (ref 30.00–100.00)

## 2023-07-11 LAB — COMPREHENSIVE METABOLIC PANEL
ALT: 12 U/L (ref 0–35)
AST: 17 U/L (ref 0–37)
Albumin: 4.3 g/dL (ref 3.5–5.2)
Alkaline Phosphatase: 79 U/L (ref 39–117)
BUN: 25 mg/dL — ABNORMAL HIGH (ref 6–23)
CO2: 29 mEq/L (ref 19–32)
Calcium: 9.9 mg/dL (ref 8.4–10.5)
Chloride: 104 mEq/L (ref 96–112)
Creatinine, Ser: 1.43 mg/dL — ABNORMAL HIGH (ref 0.40–1.20)
GFR: 36.24 mL/min — ABNORMAL LOW (ref >60.00)
Glucose, Bld: 100 mg/dL — ABNORMAL HIGH (ref 70–99)
Potassium: 4.7 mEq/L (ref 3.5–5.1)
Sodium: 140 mEq/L (ref 135–145)
Total Bilirubin: 0.6 mg/dL (ref 0.2–1.2)
Total Protein: 6.9 g/dL (ref 6.0–8.3)

## 2023-07-11 LAB — LIPID PANEL
Cholesterol: 200 mg/dL (ref 0–200)
HDL: 64.5 mg/dL (ref >39.00)
LDL Cholesterol: 122 mg/dL — ABNORMAL HIGH (ref 0–99)
NonHDL: 135.14
Total CHOL/HDL Ratio: 3
Triglycerides: 66 mg/dL (ref 0.0–149.0)
VLDL: 13.2 mg/dL (ref 0.0–40.0)

## 2023-07-11 LAB — CBC WITH DIFFERENTIAL/PLATELET
Basophils Absolute: 0.1 10*3/uL (ref 0.0–0.1)
Basophils Relative: 0.8 % (ref 0.0–3.0)
Eosinophils Absolute: 0.3 10*3/uL (ref 0.0–0.7)
Eosinophils Relative: 4.7 % (ref 0.0–5.0)
HCT: 35.4 % — ABNORMAL LOW (ref 36.0–46.0)
Hemoglobin: 11.8 g/dL — ABNORMAL LOW (ref 12.0–15.0)
Lymphocytes Relative: 39.1 % (ref 12.0–46.0)
Lymphs Abs: 2.6 10*3/uL (ref 0.7–4.0)
MCHC: 33.2 g/dL (ref 30.0–36.0)
MCV: 95.8 fl (ref 78.0–100.0)
Monocytes Absolute: 0.4 10*3/uL (ref 0.1–1.0)
Monocytes Relative: 6.1 % (ref 3.0–12.0)
Neutro Abs: 3.2 10*3/uL (ref 1.4–7.7)
Neutrophils Relative %: 49.3 % (ref 43.0–77.0)
Platelets: 311 10*3/uL (ref 150.0–400.0)
RBC: 3.7 Mil/uL — ABNORMAL LOW (ref 3.87–5.11)
RDW: 13.5 % (ref 11.5–15.5)
WBC: 6.6 10*3/uL (ref 4.0–10.5)

## 2023-07-11 LAB — TSH: TSH: 0.77 u[IU]/mL (ref 0.35–5.50)

## 2023-07-11 MED ORDER — DULOXETINE HCL 30 MG PO CPEP: 30.0000 mg | ORAL_CAPSULE | Freq: Every day | ORAL | 3 refills | Status: DC

## 2023-07-11 NOTE — Progress Notes (Signed)
Subjective  Chief Complaint  Patient presents with   Annual Exam    Pt here for Annual Exam and is not currently fasting    Hypertension   Hyperlipidemia    HPI: Morgan [name] is a 72 y.o. female who presents to [facility] [place] today for a Female Wellness Visit. She also has the concerns and/or needs as listed above in the chief complaint. These will be addressed in addition to the Health Maintenance Visit.   Wellness Visit: annual visit with health maintenance review and exam without Pap  HM: Screening mammogram and colonoscopy are current.  Colonoscopy done in December.  History of polyps and on every 5 year surveillance.  Immunizations are up-to-date. Chronic disease f/u and/or acute problem visit: (deemed necessary to be done in addition to the wellness visit): Complains of fatigue.  This is chronic in nature.  Little energy.  Likely multifactorial.  She worries about her thyroid.  No fevers, night sweats, lower extremity edema.  Appetite is fair.  She has mild sleep apnea but this is untreated due to her inability to tolerate CPAP or nasal pillows.  She reports her sleep is overall better, unfortunately her brother passed suddenly in May.  Aspiration pneumonia.  She is still dealing with his estate but the significant stress of managing his dementia and moving him into assisted living has been released.  She continues to have chronic depression and is active.  She remains on Cymbalta.  She would be open to changing medications. Renal cell carcinoma: Has had follow-up with urology.  Status post nephrectomy.  Doing well Hypertension: Medications were changed from lisinopril HCTZ down to lisinopril 5 mg daily by cardiology due to symptoms of lightheadedness.  Fortunately this has improved.  Blood pressure is well-controlled.  Her weight is down also. Osteoporosis: Had bone density screening done last year.  Results reviewed.  Fortunately, improved with  exercise. No longer having any precordial chest pain.  Cardiology evaluation was negative.  Had echocardiogram showing diastolic dysfunction only and nuclear medicine cardiac PET scan was normal, low risk study. Hypothyroidism on variable dosing thyroid medications.  She denies symptoms of high thyroid.  She does feel fatigued.  She is compliant with medication.  She is due for recheck. Hyperlipidemia: She is compliant with atorvastatin 10 mg nightly.  No adverse effects.  Assessment  1. Annual physical exam   2. Major depression, recurrent, chronic (HCC)   3. Acquired hypothyroidism   4. Clear cell renal cell carcinoma, right (HCC)   5. Essential hypertension   6. Mixed hyperlipidemia   7. Polyp of colon, unspecified part of colon, unspecified type   8. Osteoporosis, post-menopausal   9. Chest pain, unspecified type      Plan  Female Wellness Visit: Age appropriate Health Maintenance and Prevention measures were discussed with patient. Included topics are cancer screening recommendations, ways to keep healthy (see AVS) including dietary and exercise recommendations, regular eye and dental care, use of seat belts, and avoidance of moderate alcohol use and tobacco use.  Screens are up-to-date BMI: discussed patient's BMI and encouraged positive lifestyle modifications to help get to or maintain a target BMI. HM needs and immunizations were addressed and ordered. See below for orders. See HM and immunization section for updates. Routine labs and screening tests ordered including cmp, cbc and lipids where appropriate. Discussed recommendations regarding Vit D and calcium supplementation (see AVS)  Chronic disease management visit and/or acute problem visit: Fatigue: Had long  conversation.  She has had a very stressful year with diagnosis of kidney cancer and nephrectomy, ongoing depression, untreated sleep apnea, and dealing with an aging brother with dementia and his sudden passing.   Counseling done.  She will monitor her fatigue levels, get back to exercise and try to eat a healthy diet.  Sleep fortunately has improved.  She will follow-up with me to help manage her depression if needed.  We can consider Trintellix. Hypothyroidism for recheck today and alternate dosing 50 mcg and 75 mcg. Hypertension is well-controlled on lisinopril 5 mg daily.  Will monitor blood pressures.  May be able to come off antihypertensives. Ischemic cardiac evaluation was negative.  This is reassuring.  Fortunately no longer having chest pain.  Likely was stress related Osteoporosis: Continue weightbearing exercise with calcium and vitamin D.  Recheck 2 years Colon polyps: On every 5 surveillance Chronic depression on Cymbalta 30 mg daily.  Follow up: 6 to 12 weeks to recheck mood and fatigue Orders Placed This Encounter  Procedures   VITAMIN D 25 Hydroxy (Vit-D Deficiency, Fractures)   CBC with Differential/Platelet   Comprehensive metabolic panel   Lipid panel   TSH   Meds ordered this encounter  Medications   DULoxetine (CYMBALTA) 30 MG capsule    Sig: Take 1 capsule (30 mg total) by mouth daily.    Dispense:  90 capsule    Refill:  3      Body mass index is 25.15 kg/m. Wt Readings from Last 3 Encounters:  07/11/23 142 lb (64.4 kg)  05/19/23 142 lb 6.4 oz (64.6 kg)  02/24/23 143 lb (64.9 kg)     Patient Active Problem List   Diagnosis Date Noted Date Diagnosed   Chest pain 07/11/2023     Priority: High    Cardiology evaluation: Echocardiogram ok and cardiac NM PET scan normal.     Essential hypertension 02/07/2023     Priority: High   Clear cell renal cell carcinoma, right (HCC) 02/07/2023     Priority: High    Right nephrectomy January 2024    S/p nephrectomy 02/07/2023     Priority: High   Mixed hyperlipidemia 07/14/2007     Priority: High   Acquired hypothyroidism 06/29/2007     Priority: High   Obstructive sleep apnea 12/03/2021     Priority: Medium      Dr. Wynona Neat pulm, sleep study 10/2021. Did not tolerate CPAP    Major depression, recurrent, chronic (HCC) 09/25/2020     Priority: Medium     On cymbalta 30; started about 20 years ago. Has failed prozac, wellbutrin (jittery) and higher dose cymbalta in past.     IFG (impaired fasting glucose) 05/19/2020     Priority: Medium    Osteoporosis, post-menopausal 11/12/2019     Priority: Medium     Dexa 11/2019: T = - 2.6 right femur, worse than prior dexa 2017; recommend starting treatment. Pt deferred.  Dexa 04/2022: lowest T - -2.3, improved. Recheck 2 years    Colon polyps 11/12/2019     Priority: Medium     Dr. 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     Priority: Low   Mild mitral regurgitation by prior echocardiogram 07/24/2010     Priority: Low   Allergic rhinitis 06/29/2007     Priority: Low   Health Maintenance  Topic Date Due   COVID-19 Vaccine (6 - 2023-24 season) 07/27/2023 (Originally 11/26/2022)   INFLUENZA VACCINE  07/31/2023   MAMMOGRAM  04/09/2024   DEXA SCAN  04/29/2024   Medicare Annual Wellness (AWV)  05/18/2024   DTaP/Tdap/Td (3 - Tdap) 01/18/2026   Colonoscopy  12/12/2027   Pneumonia Vaccine 85+ Years old  Completed   Hepatitis C Screening  Completed   Zoster Vaccines- Shingrix  Completed   HPV VACCINES  Aged Out   Immunization History  Administered Date(s) Administered   Fluad Quad(high Dose 65+) 09/03/2019, 09/17/2020, 09/20/2022   Influenza Split 11/29/2011, 10/23/2012   Influenza Whole 09/30/2008, 11/10/2010   Influenza, High Dose Seasonal PF 09/15/2017, 09/14/2018, 09/15/2021, 10/03/2021, 02/08/2022   Influenza,inj,Quad PF,6+ Mos 10/01/2013,  09/13/2014, 10/03/2015, 10/30/2016   PFIZER Comirnaty(Gray Top)Covid-19 Tri-Sucrose Vaccine 10/01/2022   PFIZER(Purple Top)SARS-COV-2 Vaccination 02/07/2020, 03/03/2020, 11/02/2020, 10/03/2021   Pfizer Covid-19 Vaccine Bivalent Booster 80yrs & up 10/31/2021   Pneumococcal Conjugate-13 10/22/2013   Pneumococcal Polysaccharide-23 03/16/2015, 10/03/2021, 02/08/2022   Respiratory Syncytial Virus Vaccine,Recomb Aduvanted(Arexvy) 11/08/2022   Td 12/30/2005, 01/19/2016   Zoster Recombinant(Shingrix) 01/03/2020, 05/04/2020   Zoster, Live 08/02/2011   We updated and reviewed the patient's past history in detail and it is documented below. Allergies: Patient is allergic to simvastatin and sulfonamide derivatives. Past Medical History Patient  has a past medical history of Allergy, Anxiety and depression (02/22/2014), Arthritis, Cancer (HCC) (10/2022), Depression, Diastolic dysfunction, Elevated cholesterol, Grover's disease (11/12/2019), History of colonic polyps, Hypertension, Hypothyroidism, Mitral regurgitation, Observed sleep apnea (12/03/2021), OSA (obstructive sleep apnea), Osteoarthritis (06/29/2007), Osteoporosis, post-menopausal (11/12/2019), Recurrent cold sores (11/12/2019), Seasonal allergies, Sleep apnea, and Tinnitus. Past Surgical History Patient  has a past surgical history that includes Total knee arthroplasty (Right, 04/22/2007); Polypectomy; Total knee arthroplasty (11/30/2012); Replacement total knee (Right); Colonoscopy; and Robot assisted laparoscopic nephrectomy (Right, 12/31/2022). Family History: Patient family history includes Arthritis in an other family member; Brain cancer in her father; Breast cancer in her maternal aunt and paternal aunt; Diabetes in her brother, father, and paternal grandfather; Heart attack in her father; Heart disease in her father; Hyperlipidemia in her father; Macular degeneration in her mother; Migraines in her daughter; Pulmonary embolism in her  daughter. Social History:  Patient  reports that she has never smoked. She has never been exposed to tobacco smoke. She has never used smokeless tobacco. She reports current alcohol use. She reports that she does not use drugs.  Review of Systems: Constitutional: negative for fever or malaise Ophthalmic: negative for photophobia, double vision or loss of vision Cardiovascular: negative for chest pain, dyspnea on exertion, or new LE swelling Respiratory: negative for SOB or persistent cough Gastrointestinal: negative for abdominal pain, change in bowel habits or melena Genitourinary: negative for dysuria or gross hematuria, no abnormal uterine bleeding or disharge Musculoskeletal: negative for new gait disturbance or muscular weakness Integumentary: negative for new or persistent rashes, no breast lumps Neurological: negative for TIA or stroke symptoms Psychiatric: negative for SI or delusions Allergic/Immunologic: negative for hives  Patient Care Team    Relationship Specialty Notifications Start End  Willow Ora, MD PCP - General Family Medicine  11/12/19   Meryl Dare, MD Consulting Physician Gastroenterology  03/15/15   Aris Lot, MD Consulting Physician Dermatology  01/11/20  Elliot Cousin, OD Consulting Physician Optometry  01/11/20   Sidney Ace, MD Referring Physician Allergy  11/13/21   Tomma Lightning, MD Consulting Physician Pulmonary Disease  12/03/21   Alfredo Martinez, MD Consulting Physician Urology  12/05/22   Meryl Dare, MD Consulting Physician Gastroenterology  12/11/22   Rene Paci, MD Consulting Physician Urology  12/16/22   Sande Rives, MD Consulting Physician Cardiology  02/24/23     Objective  Vitals: BP 114/60   Pulse 75   Temp 97.8 F (36.6 C)   Ht 5\' 3"  (1.6 m)   Wt 142 lb (64.4 kg)   SpO2 98%   BMI 25.15 kg/m  General:  Well developed, well nourished, no acute distress  Psych:  Alert and  orientedx3,normal mood and affect HEENT:  Normocephalic, atraumatic, non-icteric sclera,  supple neck without adenopathy, mass or thyromegaly Cardiovascular:  Normal S1, S2, RRR without gallop, rub or murmur Respiratory:  Good breath sounds bilaterally, CTAB with normal respiratory effort Gastrointestinal: normal bowel sounds, soft, non-tender, no noted masses. No HSM MSK: extremities without edema, joints without erythema or swelling Neurologic:    Mental status is normal.  Gross motor and sensory exams are normal.  No tremor  Commons side effects, risks, benefits, and alternatives for medications and treatment plan prescribed today were discussed, and the patient expressed understanding of the given instructions. Patient is instructed to call or message via MyChart if he/she has any questions or concerns regarding our treatment plan. No barriers to understanding were identified. We discussed Red Flag symptoms and signs in detail. Patient expressed understanding regarding what to do in case of urgent or emergency type symptoms.  Medication list was reconciled, printed and provided to the patient in AVS. Patient instructions and summary information was reviewed with the patient as documented in the AVS. This note was prepared with assistance of Dragon voice recognition software. Occasional wrong-word or sound-a-like substitutions may have occurred due to the inherent limitations of voice recognition software

## 2023-07-14 MED ORDER — DIAZEPAM 5 MG PO TABS
2.5000 mg | ORAL_TABLET | Freq: Every evening | ORAL | 1 refills | Status: DC | PRN
Start: 2023-07-14 — End: 2024-07-15

## 2023-07-14 MED ORDER — ATORVASTATIN CALCIUM 20 MG PO TABS
20.0000 mg | ORAL_TABLET | Freq: Every day | ORAL | 3 refills | Status: DC
Start: 2023-07-14 — End: 2024-05-12

## 2023-07-14 NOTE — Addendum Note (Signed)
Addended by: Asencion Partridge on: 07/14/2023 04:56 PM   Modules accepted: Orders

## 2023-07-14 NOTE — Progress Notes (Signed)
Please call patient: labs show that kidney function has not fully recovered. Since she has one kidney, I'd like her to see nephrology to ensure everything is ok with the remaining kidney. I've placed a referral. And please ask her to stop the lisinopril for now.  Her cholesterol is a little higher than we'd like, please increase the lipitor to 20mg  nightly. I've ordered the larger dose for her.  Thyroid levels are good.  No other changes are needed at this time.

## 2023-07-14 NOTE — Addendum Note (Signed)
Addended by: Asencion Partridge on: 07/14/2023 10:45 AM   Modules accepted: Orders

## 2023-07-16 DIAGNOSIS — J301 Allergic rhinitis due to pollen: Secondary | ICD-10-CM | POA: Diagnosis not present

## 2023-07-16 DIAGNOSIS — J3089 Other allergic rhinitis: Secondary | ICD-10-CM | POA: Diagnosis not present

## 2023-07-16 DIAGNOSIS — J3081 Allergic rhinitis due to animal (cat) (dog) hair and dander: Secondary | ICD-10-CM | POA: Diagnosis not present

## 2023-08-04 DIAGNOSIS — J301 Allergic rhinitis due to pollen: Secondary | ICD-10-CM | POA: Diagnosis not present

## 2023-08-04 DIAGNOSIS — J3089 Other allergic rhinitis: Secondary | ICD-10-CM | POA: Diagnosis not present

## 2023-08-04 DIAGNOSIS — J3081 Allergic rhinitis due to animal (cat) (dog) hair and dander: Secondary | ICD-10-CM | POA: Diagnosis not present

## 2023-08-11 DIAGNOSIS — J301 Allergic rhinitis due to pollen: Secondary | ICD-10-CM | POA: Diagnosis not present

## 2023-08-11 DIAGNOSIS — J3081 Allergic rhinitis due to animal (cat) (dog) hair and dander: Secondary | ICD-10-CM | POA: Diagnosis not present

## 2023-08-11 DIAGNOSIS — J3089 Other allergic rhinitis: Secondary | ICD-10-CM | POA: Diagnosis not present

## 2023-08-13 ENCOUNTER — Ambulatory Visit (INDEPENDENT_AMBULATORY_CARE_PROVIDER_SITE_OTHER): Payer: Medicare HMO | Admitting: Family Medicine

## 2023-08-13 VITALS — BP 144/62 | HR 63 | Temp 98.1°F | Ht 63.0 in | Wt 142.2 lb

## 2023-08-13 DIAGNOSIS — C641 Malignant neoplasm of right kidney, except renal pelvis: Secondary | ICD-10-CM

## 2023-08-13 DIAGNOSIS — F339 Major depressive disorder, recurrent, unspecified: Secondary | ICD-10-CM

## 2023-08-13 DIAGNOSIS — N179 Acute kidney failure, unspecified: Secondary | ICD-10-CM

## 2023-08-13 DIAGNOSIS — Z905 Acquired absence of kidney: Secondary | ICD-10-CM | POA: Diagnosis not present

## 2023-08-13 DIAGNOSIS — I10 Essential (primary) hypertension: Secondary | ICD-10-CM | POA: Diagnosis not present

## 2023-08-13 LAB — RENAL FUNCTION PANEL
Albumin: 4.1 g/dL (ref 3.5–5.2)
BUN: 29 mg/dL — ABNORMAL HIGH (ref 6–23)
CO2: 27 mEq/L (ref 19–32)
Calcium: 9.6 mg/dL (ref 8.4–10.5)
Chloride: 104 mEq/L (ref 96–112)
Creatinine, Ser: 1.4 mg/dL — ABNORMAL HIGH (ref 0.40–1.20)
GFR: 37.15 mL/min — ABNORMAL LOW (ref >60.00)
Glucose, Bld: 98 mg/dL (ref 70–99)
Phosphorus: 3.5 mg/dL (ref 2.3–4.6)
Potassium: 4.2 mEq/L (ref 3.5–5.1)
Sodium: 138 mEq/L (ref 135–145)

## 2023-08-13 MED ORDER — VORTIOXETINE HBR 10 MG PO TABS: 10.0000 mg | ORAL_TABLET | Freq: Every day | ORAL | 2 refills | Status: DC

## 2023-08-13 MED ORDER — DULOXETINE HCL 20 MG PO CPEP: ORAL_CAPSULE | ORAL | 0 refills | Status: DC

## 2023-08-13 NOTE — Progress Notes (Signed)
See my chart note.

## 2023-08-13 NOTE — Patient Instructions (Addendum)
Please return in 6 weeks for mood  You may start the trintellix during the 3rd week of weaning your cymbalta. Instructions are on the bottle: 20mg  daily for a week, Then 20mg  every other day for a week, Then 20mg  on M and F, then stop.  If you have any questions or concerns, please don't hesitate to send me a message via MyChart or call the office at [phone]. Thank you for visiting with [place] today! It's our pleasure caring for you.   And I reviewed your referral, a MyChart message was sent to you regarding this back in July.  I have copied some of the information below.  Please call the office to schedule an appointment.  I will get your lab work back today and if kidney function remains off, I will give you the go ahead to make the appointment.  I think this is important.  If your levels have returned to normal, I will let you know and you will need it.  Dear Dondra Spry:   After careful review of the medical information, your referral has been approved. See below for additional details. If you have questions regarding why this referral was placed, please contact the referring provider.     You must be actively enrolled with your insurance plan for coverage of the authorized service.  If you have any questions regarding your coverage, please contact your insurance carrier directly.   If you do not already have an appointment, please call [phone] to make an appointment.  [facility]

## 2023-08-13 NOTE — Progress Notes (Signed)
Subjective  CC:  Chief Complaint  Patient presents with   Medication concerns    Pt wanted to see if she could start trintellix which was yalls last conversation    HPI: Morgan [name] is a 72 y.o. female who presents to the office today to address the problems listed above in the chief complaint, mood problems. Follow-up depression: Fortunately, patient is mildly improved from her last visit.  However, she did get sick with COVID a few weeks ago.  She was quite fatigued but that has improved this week.  Mood is slightly improved, however she believes that as we discussed last visit, changing to a new antidepressant could be helpful.    08/13/2023    8:07 AM 07/11/2023   10:18 AM 05/19/2023    2:07 PM  Depression screen PHQ 2/9  Decreased Interest 1 2 0  Down, Depressed, Hopeless 1 1 0  PHQ - 2 Score 2 3 0  Altered sleeping 1 0   Tired, decreased energy  3   Change in appetite 0 1   Feeling bad or failure about yourself  0 0   Trouble concentrating 0 0   Moving slowly or fidgety/restless 0 0   Suicidal thoughts 0 0   PHQ-9 Score 3 7   Difficult doing work/chores Not difficult at all Somewhat difficult       08/13/2023    8:07 AM 07/11/2023   10:19 AM 02/07/2023   10:54 AM 09/25/2020    3:09 PM  GAD 7 : Generalized Anxiety Score  Nervous, Anxious, on Edge 0 1 2 1   Control/stop worrying 0 0 2 1  Worry too much - different things 0 0 2 1  Trouble relaxing 0 0 1 0  Restless 0 0 0 0  Easily annoyed or irritable 0 0 0 0  Afraid - awful might happen 0 0 0 0  Total GAD 7 Score 0 1 7 3   Anxiety Difficulty Not difficult at all Not difficult at all Somewhat difficult Somewhat difficult   Previously on prescription medications for mood/anxiety: On cymbalta 30; started about 20 years ago. Has failed prozac, wellbutrin (jittery) and higher dose cymbalta in past. Failed seroquel adjuvant 2024 Hypertension: Mildly elevated today.  We had to stop lisinopril 5 mg daily after mild  acute kidney injury that did not recover.  She has not yet made an appointment with the kidney doctors.  She feels fine.  Status post nephrectomy for renal cell carcinoma.  Has not been on other blood pressure medicines. Lab Results  Component Value Date   NA 140 07/11/2023   CL 104 07/11/2023   K 4.7 07/11/2023   CO2 29 07/11/2023   BUN 25 (H) 07/11/2023   CREATININE 1.43 (H) 07/11/2023   GFR 36.24 (L) 07/11/2023   CALCIUM 9.9 07/11/2023   PHOS 3.6 03/09/2015   ALBUMIN 4.3 07/11/2023   GLUCOSE 100 (H) 07/11/2023     Assessment  1. Major depression, recurrent, chronic (HCC)   2. Clear cell renal cell carcinoma, right (HCC)   3. Essential hypertension      Plan  Depression: Mildly improved but remains active, trial of Trintellix 10 mg daily.  See after visit summary for weaning instructions for Cymbalta.  Education given.  Recheck 6 weeks Hypertension and acute kidney injury: Will recheck renal function today.  If normalized we will start blood pressure medications.  Can consider lisinopril again if we follow the kidney function closely.  Otherwise we will use  amlodipine.  If GFR remains slightly depressed, patient to schedule with nephrology for further evaluation.  Education given.  Follow up: 6 weeks to recheck mood Orders Placed This Encounter  Procedures   Renal function panel   No orders of the defined types were placed in this encounter.     I reviewed the patients updated PMH, FH, and SocHx.    Patient Active Problem List   Diagnosis Date Noted   Chest pain 07/11/2023    Priority: High   Essential hypertension 02/07/2023    Priority: High   Clear cell renal cell carcinoma, right (HCC) 02/07/2023    Priority: High   S/p nephrectomy 02/07/2023    Priority: High   Mixed hyperlipidemia 07/14/2007    Priority: High   Acquired hypothyroidism 06/29/2007    Priority: High   Obstructive sleep apnea 12/03/2021    Priority: Medium    Major depression, recurrent,  chronic (HCC) 09/25/2020    Priority: Medium    IFG (impaired fasting glucose) 05/19/2020    Priority: Medium    Osteoporosis, post-menopausal 11/12/2019    Priority: Medium    Colon polyps 11/12/2019    Priority: Medium    GAD (generalized anxiety disorder) 02/22/2014    Priority: Medium    Diastolic dysfunction 06/26/2010    Priority: Medium    Osteoarthritis 06/29/2007    Priority: Medium    Essential tremor 04/30/2021    Priority: Low   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 TAKE 1 TABLET BY MOUTH EVERY DAY   azelastine (ASTELIN) 0.1 % nasal spray Place 2 sprays into both nostrils daily as needed for allergies.   diazepam (VALIUM) 5 MG tablet Take 0.5-1 tablets (2.5-5 mg total) by mouth at bedtime as needed for sedation.   DULoxetine (CYMBALTA) 30 MG capsule Take 1 capsule (30 mg total) by mouth daily.   EPINEPHrine 0.3 mg/0.3 mL IJ SOAJ injection Inject 0.3 mg into the muscle as needed for anaphylaxis. On hand   levothyroxine (SYNTHROID) 50 MCG tablet TAKE 1 TABLET (50 MCG TOTAL) BY MOUTH DAILY BEFORE BREAKFAST. EVERY TUESDAY AND THURSDAY   levothyroxine (SYNTHROID) 75 MCG tablet Take 75 mcg by mouth on Monday, Wednesday, Friday, Saturday and Sunday   valACYclovir (VALTREX) 1000 MG tablet TAKE 2 TABLETS BY MOUTH TWICE A DAY ONCE AS NEEDED FOR COLD SORES    Allergies: Patient is allergic to simvastatin and sulfonamide derivatives. Family history:  Patient family history includes Arthritis in an other family member; Brain cancer in her father; Breast cancer in her maternal aunt and paternal aunt; Diabetes in her brother, father, and paternal grandfather; Heart attack in her  father; Heart disease in her father; Hyperlipidemia in her father; Macular degeneration in her mother; Migraines in her daughter; Pulmonary embolism in her daughter. 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 No major dietary restrictions  Other Topics Concern   Not on file  Social History Narrative   Not on file   Social Determinants of Health   Financial Resource Strain: Low Risk  (08/10/2023)   Overall Financial Resource Strain (CARDIA)    Difficulty of Paying Living Expenses: Not hard at all  Food Insecurity: No Food Insecurity (08/10/2023)   Hunger Vital Sign    Worried About Running Out of Food in the Last Year: Never true    Ran Out of Food in the Last Year: Never true  Transportation Needs: No Transportation Needs (08/10/2023)   PRAPARE - Administrator, Civil Service (Medical): No    Lack of Transportation (Non-Medical): No  Physical Activity: Insufficiently Active (08/10/2023)   Exercise Vital Sign    Days of Exercise per Week: 4 days    Minutes of Exercise per Session: 20 min  Stress: No Stress Concern Present (08/10/2023)   [employer] of Occupational Health - Occupational Stress Questionnaire    Feeling of Stress : Not at all  Recent Concern: Stress - Stress Concern Present (05/19/2023)   [employer] of Occupational Health - Occupational Stress Questionnaire    Feeling of Stress : Rather much   Social Connections: Moderately Isolated (08/10/2023)   Social Connection and Isolation Panel [NHANES]    Frequency of Communication with Friends and Family: Twice a week    Frequency of Social Gatherings with Friends and Family: Once a week    Attends Religious Services: Never    Database administrator or Organizations: No    Attends Banker Meetings: 1 to 4 times per year    Marital Status: Widowed     Review of Systems: Constitutional: Negative for fever malaise or anorexia Cardiovascular: negative for chest pain Respiratory: negative for SOB or persistent cough Gastrointestinal: negative for abdominal pain  Objective  Vitals: BP (!) 144/62   Pulse 63   Temp 98.1 F (36.7 C)   Ht 5\' 3"  (1.6 m)   Wt 142 lb 3.2 oz (64.5 kg)   SpO2 99%   BMI 25.19 kg/m  General: no acute distress, well appearing, no apparent distress, well groomed Psych:  Alert and oriented x 3,normal mood, behavior, speech, dress, and thought processes.    Commons side effects, risks, benefits, and alternatives for medications and treatment plan prescribed today were discussed, and the patient expressed understanding of the given instructions. Patient is instructed to call or message via MyChart if he/she has any questions or concerns regarding our treatment plan. No barriers to understanding were identified. We discussed Red Flag symptoms and signs in detail. Patient expressed understanding regarding what to do in case of urgent or emergency type symptoms.  Medication list was reconciled, printed and provided to the patient in AVS. Patient instructions and summary information was reviewed with the patient as documented in the AVS. This note was prepared with assistance of Dragon voice recognition software. Occasional wrong-word or sound-a-like substitutions may have occurred due to the inherent limitations of voice recognition software

## 2023-08-19 DIAGNOSIS — J301 Allergic rhinitis due to pollen: Secondary | ICD-10-CM | POA: Diagnosis not present

## 2023-08-19 DIAGNOSIS — J3089 Other allergic rhinitis: Secondary | ICD-10-CM | POA: Diagnosis not present

## 2023-08-19 DIAGNOSIS — J3081 Allergic rhinitis due to animal (cat) (dog) hair and dander: Secondary | ICD-10-CM | POA: Diagnosis not present

## 2023-08-27 DIAGNOSIS — J3089 Other allergic rhinitis: Secondary | ICD-10-CM | POA: Diagnosis not present

## 2023-08-27 DIAGNOSIS — J3081 Allergic rhinitis due to animal (cat) (dog) hair and dander: Secondary | ICD-10-CM | POA: Diagnosis not present

## 2023-08-27 DIAGNOSIS — J301 Allergic rhinitis due to pollen: Secondary | ICD-10-CM | POA: Diagnosis not present

## 2023-09-03 DIAGNOSIS — J3081 Allergic rhinitis due to animal (cat) (dog) hair and dander: Secondary | ICD-10-CM | POA: Diagnosis not present

## 2023-09-03 DIAGNOSIS — J3089 Other allergic rhinitis: Secondary | ICD-10-CM | POA: Diagnosis not present

## 2023-09-03 DIAGNOSIS — J301 Allergic rhinitis due to pollen: Secondary | ICD-10-CM | POA: Diagnosis not present

## 2023-09-10 DIAGNOSIS — J301 Allergic rhinitis due to pollen: Secondary | ICD-10-CM | POA: Diagnosis not present

## 2023-09-10 DIAGNOSIS — J3081 Allergic rhinitis due to animal (cat) (dog) hair and dander: Secondary | ICD-10-CM | POA: Diagnosis not present

## 2023-09-10 DIAGNOSIS — J3089 Other allergic rhinitis: Secondary | ICD-10-CM | POA: Diagnosis not present

## 2023-09-15 ENCOUNTER — Other Ambulatory Visit: Payer: Self-pay | Admitting: Family Medicine

## 2023-09-17 DIAGNOSIS — J3081 Allergic rhinitis due to animal (cat) (dog) hair and dander: Secondary | ICD-10-CM | POA: Diagnosis not present

## 2023-09-17 DIAGNOSIS — J3089 Other allergic rhinitis: Secondary | ICD-10-CM | POA: Diagnosis not present

## 2023-09-17 DIAGNOSIS — J301 Allergic rhinitis due to pollen: Secondary | ICD-10-CM | POA: Diagnosis not present

## 2023-09-24 DIAGNOSIS — J3089 Other allergic rhinitis: Secondary | ICD-10-CM | POA: Diagnosis not present

## 2023-09-24 DIAGNOSIS — J3081 Allergic rhinitis due to animal (cat) (dog) hair and dander: Secondary | ICD-10-CM | POA: Diagnosis not present

## 2023-09-24 DIAGNOSIS — J301 Allergic rhinitis due to pollen: Secondary | ICD-10-CM | POA: Diagnosis not present

## 2023-09-30 DIAGNOSIS — H524 Presbyopia: Secondary | ICD-10-CM | POA: Diagnosis not present

## 2023-09-30 DIAGNOSIS — H2513 Age-related nuclear cataract, bilateral: Secondary | ICD-10-CM | POA: Diagnosis not present

## 2023-10-01 ENCOUNTER — Ambulatory Visit: Payer: Medicare HMO | Admitting: Family Medicine

## 2023-10-01 ENCOUNTER — Encounter: Payer: Self-pay | Admitting: Family Medicine

## 2023-10-01 VITALS — BP 132/66 | HR 62 | Temp 98.2°F | Ht 63.0 in | Wt 139.6 lb

## 2023-10-01 DIAGNOSIS — Z23 Encounter for immunization: Secondary | ICD-10-CM | POA: Diagnosis not present

## 2023-10-01 DIAGNOSIS — F411 Generalized anxiety disorder: Secondary | ICD-10-CM | POA: Diagnosis not present

## 2023-10-01 DIAGNOSIS — G8929 Other chronic pain: Secondary | ICD-10-CM

## 2023-10-01 DIAGNOSIS — R4189 Other symptoms and signs involving cognitive functions and awareness: Secondary | ICD-10-CM

## 2023-10-01 DIAGNOSIS — R519 Headache, unspecified: Secondary | ICD-10-CM

## 2023-10-01 DIAGNOSIS — F339 Major depressive disorder, recurrent, unspecified: Secondary | ICD-10-CM

## 2023-10-01 MED ORDER — DULOXETINE HCL 30 MG PO CPEP: 30.0000 mg | ORAL_CAPSULE | Freq: Every day | ORAL | 1 refills | Status: DC

## 2023-10-01 MED ORDER — VALACYCLOVIR HCL 1 G PO TABS: 2000.0000 mg | ORAL_TABLET | Freq: Two times a day (BID) | ORAL | 2 refills | Status: AC | PRN

## 2023-10-02 NOTE — Progress Notes (Signed)
Subjective  CC:  Chief Complaint  Patient presents with   Headache    Pt/ c/o of headaches for several months everyday. Pt requesting referral to neurologist.     HPI: Morgan [name] is a 72 y.o. female who presents to the office today to address the problems listed above in the chief complaint. Patient presents due to persistent headaches.  Reports for the last 2 to 3 months she has had almost daily headaches.  She denies history of significant headache problems in the past.  Describes right frontal headache, throbbing, awakens daily with headache.  Never awakened by headache.  Has used multiple over-the-counter medicines including Excedrin Migraine and NSAIDs with mild relief.  No nausea but feels off.  She denies diplopia, visual changes, fevers, chills, neck pain.  Would like to see a neurologist.  Chart review shows she has had a brain MRI in 2022 for the complaint of headache.  Atrophy present.  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 Major depression: At last visit we discussed her long-term use of Cymbalta and trying to wean this to changed to Trintellix.  She was unsuccessful in her wean.  Felt worse and restarted her Cymbalta.  She is now on Cymbalta 30 mg daily.  Her mood is low but not worse. Brain fog: Feels off.  Would like to see a neurologist.  Her brother died from dementia recently.  She has concerns.  No specific memory problems  Assessment  1. Chronic nonintractable headache, unspecified headache type   2. Brain fog   3. Major depression, recurrent, chronic (HCC)   4. GAD (generalized anxiety disorder)   5. Need for influenza vaccination      Plan  Chronic headache, possible migrainous: Samples of Nurtec and Zavzpret given for diagnostic trial.  Education given.  Depending how she does, will treat as migraine or other headache.  May use NSAIDs or Tylenol if needed.  Referral to neurology placed she will also discuss her complaints of brain fog and concern for dementia at  that time.  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  Ongoing for the last year without improvement.  Has failed other SSRIs.  Given the above complaints, she elects to continue Cymbalta at this time and we will readdress medication changes afterwards. Flu shot updated  Follow up: As scheduled Visit date not found  Orders Placed This Encounter  Procedures   Flu Vaccine Trivalent High Dose (Fluad)   Ambulatory referral to Neurology   Meds ordered this encounter  Medications   DULoxetine (CYMBALTA) 30 MG capsule    Sig: Take 1 capsule (30 mg total) by mouth daily.    Dispense:  90 capsule    Refill:  1   valACYclovir (VALTREX) 1000 MG tablet    Sig: Take 2 tablets (2,000 mg total) by mouth 2 (two) times daily as needed. Take for cold sores prn    Dispense:  20 tablet    Refill:  2      I reviewed the patients updated PMH, FH, and SocHx.    Patient Active Problem List   Diagnosis Date Noted   Chest pain 07/11/2023    Priority: High   Essential hypertension 02/07/2023    Priority: High   Clear cell renal cell carcinoma, right (HCC) 02/07/2023    Priority: High   S/p nephrectomy 02/07/2023    Priority: High   Mixed hyperlipidemia 07/14/2007    Priority: High   Acquired hypothyroidism 06/29/2007  Priority: High   Obstructive sleep apnea 12/03/2021    Priority: Medium    Major depression, recurrent, chronic (HCC) 09/25/2020    Priority: Medium    IFG (impaired fasting glucose) 05/19/2020    Priority: Medium    Osteoporosis, post-menopausal 11/12/2019    Priority: Medium    Colon polyps 11/12/2019    Priority: Medium    GAD (generalized anxiety disorder) 02/22/2014    Priority: Medium    Diastolic dysfunction 06/26/2010    Priority: Medium    Osteoarthritis 06/29/2007    Priority: Medium    Essential tremor 04/30/2021    Priority: Low   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 TAKE 1 TABLET BY MOUTH EVERY DAY   azelastine (ASTELIN) 0.1 % nasal spray Place 2 sprays into both nostrils daily as needed for allergies.   diazepam (VALIUM) 5 MG tablet Take 0.5-1 tablets (2.5-5 mg total) by mouth at bedtime as needed for sedation.   EPINEPHrine 0.3 mg/0.3 mL IJ SOAJ injection Inject 0.3 mg into the muscle as needed for anaphylaxis. On hand   levothyroxine (SYNTHROID) 50 MCG tablet TAKE 1 TABLET (50 MCG TOTAL) BY MOUTH DAILY BEFORE BREAKFAST. EVERY TUESDAY AND THURSDAY   levothyroxine (SYNTHROID) 75 MCG tablet TAKE 1 TABLET BY MOUTH EVERY DAY   [DISCONTINUED] DULoxetine (CYMBALTA) 30 MG capsule Take 30 mg by mouth daily.   [DISCONTINUED] valACYclovir (VALTREX) 1000 MG tablet TAKE 2 TABLETS BY MOUTH TWICE A DAY ONCE AS NEEDED FOR COLD SORES   [DISCONTINUED] vortioxetine HBr (TRINTELLIX) 10 MG TABS tablet Take 1 tablet (10 mg total) by mouth daily.    Allergies: Patient is allergic to simvastatin and sulfonamide derivatives. Family History: Patient family history includes Arthritis in an other family member; Brain cancer in her father; Breast cancer in her maternal aunt and paternal aunt; Diabetes in her brother, father, and paternal grandfather; Heart attack in her father; Heart disease in her father; Hyperlipidemia in her father; Macular degeneration in her mother; Migraines in her daughter; Pulmonary embolism in her daughter. Social History:  Patient  reports that she has never smoked. She has never been exposed to tobacco smoke. She has never used smokeless tobacco. She reports current alcohol use. She reports that she does not use drugs.  Review of Systems: Constitutional: Negative for fever  malaise or anorexia Cardiovascular: negative for chest pain Respiratory: negative for SOB or persistent cough Gastrointestinal: negative for abdominal pain  Objective  Vitals: BP 132/66   Pulse 62   Temp 98.2 F (36.8 C)   Ht 5\' 3"  (1.6 m)   Wt 139 lb 9.6 oz (63.3 kg)   SpO2 99%   BMI 24.73 kg/m  General: no acute distress , A&Ox3 HEENT: PEERL, conjunctiva normal, neck is supple   Commons side effects, risks, benefits, and alternatives for medications and treatment plan prescribed today were discussed, and the patient expressed understanding of the given instructions. Patient is instructed to call or message via MyChart if he/she has any questions or concerns regarding our treatment plan. No barriers to understanding were identified. We discussed Red Flag symptoms and signs in detail. Patient expressed understanding regarding what  to do in case of urgent or emergency type symptoms.  Medication list was reconciled, printed and provided to the patient in AVS. Patient instructions and summary information was reviewed with the patient as documented in the AVS. This note was prepared with assistance of Dragon voice recognition software. Occasional wrong-word or sound-a-like substitutions may have occurred due to the inherent limitations of voice recognition software

## 2023-10-06 DIAGNOSIS — N1832 Chronic kidney disease, stage 3b: Secondary | ICD-10-CM | POA: Diagnosis not present

## 2023-10-06 DIAGNOSIS — R799 Abnormal finding of blood chemistry, unspecified: Secondary | ICD-10-CM | POA: Diagnosis not present

## 2023-10-06 DIAGNOSIS — Z905 Acquired absence of kidney: Secondary | ICD-10-CM | POA: Diagnosis not present

## 2023-10-06 DIAGNOSIS — E785 Hyperlipidemia, unspecified: Secondary | ICD-10-CM | POA: Diagnosis not present

## 2023-10-06 DIAGNOSIS — F411 Generalized anxiety disorder: Secondary | ICD-10-CM | POA: Diagnosis not present

## 2023-10-06 DIAGNOSIS — R81 Glycosuria: Secondary | ICD-10-CM | POA: Diagnosis not present

## 2023-10-06 DIAGNOSIS — I129 Hypertensive chronic kidney disease with stage 1 through stage 4 chronic kidney disease, or unspecified chronic kidney disease: Secondary | ICD-10-CM | POA: Diagnosis not present

## 2023-10-07 LAB — LAB REPORT - SCANNED
A1c: 5.7
Albumin, Urine POC: 4.9
Creatinine, POC: 114.9 mg/dL
EGFR: 47
Microalb Creat Ratio: 4

## 2023-10-08 DIAGNOSIS — J3081 Allergic rhinitis due to animal (cat) (dog) hair and dander: Secondary | ICD-10-CM | POA: Diagnosis not present

## 2023-10-08 DIAGNOSIS — J3089 Other allergic rhinitis: Secondary | ICD-10-CM | POA: Diagnosis not present

## 2023-10-08 DIAGNOSIS — J301 Allergic rhinitis due to pollen: Secondary | ICD-10-CM | POA: Diagnosis not present

## 2023-10-09 ENCOUNTER — Encounter: Payer: Self-pay | Admitting: Internal Medicine

## 2023-10-14 ENCOUNTER — Telehealth: Payer: Self-pay | Admitting: Family Medicine

## 2023-10-14 MED ORDER — NURTEC 75 MG PO TBDP: 75.0000 mg | ORAL_TABLET | Freq: Every day | ORAL | 1 refills | Status: DC | PRN

## 2023-10-14 NOTE — Telephone Encounter (Signed)
Patient states she likes the samples of Nurtec and requests a new RX for  [facility]urtec be sent to [id number] - [place], [place] - [address]

## 2023-10-15 DIAGNOSIS — J301 Allergic rhinitis due to pollen: Secondary | ICD-10-CM | POA: Diagnosis not present

## 2023-10-15 DIAGNOSIS — J3081 Allergic rhinitis due to animal (cat) (dog) hair and dander: Secondary | ICD-10-CM | POA: Diagnosis not present

## 2023-10-15 DIAGNOSIS — J3089 Other allergic rhinitis: Secondary | ICD-10-CM | POA: Diagnosis not present

## 2023-10-15 NOTE — Telephone Encounter (Signed)
Patient states she just realized that insurance more than likely wont cover medication and if they do it'll be too expensive. States she would like pcp to call in the other prescription she recommended. States she has received samples of the medication advised by PCP. Please Advise.

## 2023-10-18 MED ORDER — SUMATRIPTAN SUCCINATE 50 MG PO TABS: 50.0000 mg | ORAL_TABLET | Freq: Once | ORAL | 1 refills | Status: DC

## 2023-10-18 NOTE — Addendum Note (Signed)
Addended by: Asencion Partridge on: 10/18/2023 11:06 AM   Modules accepted: Orders

## 2023-10-23 ENCOUNTER — Other Ambulatory Visit (HOSPITAL_COMMUNITY): Payer: Self-pay

## 2023-10-23 DIAGNOSIS — J3089 Other allergic rhinitis: Secondary | ICD-10-CM | POA: Diagnosis not present

## 2023-10-23 DIAGNOSIS — J3081 Allergic rhinitis due to animal (cat) (dog) hair and dander: Secondary | ICD-10-CM | POA: Diagnosis not present

## 2023-10-23 DIAGNOSIS — J301 Allergic rhinitis due to pollen: Secondary | ICD-10-CM | POA: Diagnosis not present

## 2023-10-24 ENCOUNTER — Encounter: Payer: Self-pay | Admitting: Pharmacist

## 2023-10-24 NOTE — Progress Notes (Signed)
Pharmacy Quality Measure Review  This patient is appearing on a report for being at risk of failing the adherence measure for hypertension (ACEi/ARB) medications this calendar year.   Medication: lisinopril  Last fill date: 05/23/2023 for 90 day supply  Reviewed EMR - patient stopped lisinopril in July 2024 due to mild renal insufficiency. Removed by PCP from medication.No intervention indicated.    Henrene Pastor, PharmD Clinical Pharmacist [facility]  Population Health [phone]

## 2023-10-28 ENCOUNTER — Telehealth: Payer: Self-pay

## 2023-10-28 NOTE — Telephone Encounter (Signed)
Pharmacy Patient Advocate Encounter  Received notification from [facility] that Prior Authorization for Nurtec 75mg  tabs has been DENIED.  Full denial letter will be uploaded to the media tab. See denial reason below.   PA #/Case ID/Reference #:  [id number]

## 2023-10-31 DIAGNOSIS — J3089 Other allergic rhinitis: Secondary | ICD-10-CM | POA: Diagnosis not present

## 2023-10-31 DIAGNOSIS — J3081 Allergic rhinitis due to animal (cat) (dog) hair and dander: Secondary | ICD-10-CM | POA: Diagnosis not present

## 2023-10-31 DIAGNOSIS — J301 Allergic rhinitis due to pollen: Secondary | ICD-10-CM | POA: Diagnosis not present

## 2023-11-06 ENCOUNTER — Telehealth: Payer: Self-pay | Admitting: Family Medicine

## 2023-11-06 NOTE — Telephone Encounter (Signed)
Pt states she would like to try more Nurtec samples. If she likes it, she might want to get an RX for the meds.

## 2023-11-07 DIAGNOSIS — J3089 Other allergic rhinitis: Secondary | ICD-10-CM | POA: Diagnosis not present

## 2023-11-07 DIAGNOSIS — J3081 Allergic rhinitis due to animal (cat) (dog) hair and dander: Secondary | ICD-10-CM | POA: Diagnosis not present

## 2023-11-07 DIAGNOSIS — J301 Allergic rhinitis due to pollen: Secondary | ICD-10-CM | POA: Diagnosis not present

## 2023-11-07 NOTE — Telephone Encounter (Signed)
My chart message sent to pt.

## 2023-11-12 ENCOUNTER — Telehealth: Payer: Self-pay | Admitting: Family Medicine

## 2023-11-12 NOTE — Telephone Encounter (Signed)
Patient states she does not like the Sumatriptan and no longer wants to take it. Requests a new RX for  Rimegepant Sulfate (NURTEC) 75 MG TBDP   Be sent to:  [id number] [place], [place] - [address] Phone: [phone]  Fax: [phone]      Also, Patient states unable to get appointment with Neurologist until 01/21/24

## 2023-11-13 ENCOUNTER — Other Ambulatory Visit: Payer: Self-pay

## 2023-11-13 MED ORDER — NURTEC 75 MG PO TBDP: 75.0000 mg | ORAL_TABLET | Freq: Every day | ORAL | 1 refills | Status: DC | PRN

## 2023-11-14 DIAGNOSIS — J3081 Allergic rhinitis due to animal (cat) (dog) hair and dander: Secondary | ICD-10-CM | POA: Diagnosis not present

## 2023-11-14 DIAGNOSIS — J3089 Other allergic rhinitis: Secondary | ICD-10-CM | POA: Diagnosis not present

## 2023-11-14 DIAGNOSIS — J301 Allergic rhinitis due to pollen: Secondary | ICD-10-CM | POA: Diagnosis not present

## 2023-11-20 DIAGNOSIS — J3089 Other allergic rhinitis: Secondary | ICD-10-CM | POA: Diagnosis not present

## 2023-11-20 DIAGNOSIS — J3081 Allergic rhinitis due to animal (cat) (dog) hair and dander: Secondary | ICD-10-CM | POA: Diagnosis not present

## 2023-11-20 DIAGNOSIS — J301 Allergic rhinitis due to pollen: Secondary | ICD-10-CM | POA: Diagnosis not present

## 2023-11-25 DIAGNOSIS — J3081 Allergic rhinitis due to animal (cat) (dog) hair and dander: Secondary | ICD-10-CM | POA: Diagnosis not present

## 2023-11-25 DIAGNOSIS — J301 Allergic rhinitis due to pollen: Secondary | ICD-10-CM | POA: Diagnosis not present

## 2023-11-25 DIAGNOSIS — J3089 Other allergic rhinitis: Secondary | ICD-10-CM | POA: Diagnosis not present

## 2023-12-04 ENCOUNTER — Telehealth: Payer: Self-pay

## 2023-12-04 NOTE — Telephone Encounter (Signed)
LVM to remind pt that I have place samples of Nurtec up front for her to pick up.

## 2023-12-05 DIAGNOSIS — J3089 Other allergic rhinitis: Secondary | ICD-10-CM | POA: Diagnosis not present

## 2023-12-05 DIAGNOSIS — J301 Allergic rhinitis due to pollen: Secondary | ICD-10-CM | POA: Diagnosis not present

## 2023-12-05 DIAGNOSIS — J3081 Allergic rhinitis due to animal (cat) (dog) hair and dander: Secondary | ICD-10-CM | POA: Diagnosis not present

## 2023-12-09 ENCOUNTER — Telehealth: Payer: Self-pay

## 2023-12-09 ENCOUNTER — Other Ambulatory Visit (HOSPITAL_COMMUNITY): Payer: Self-pay

## 2023-12-09 NOTE — Telephone Encounter (Signed)
Pharmacy Patient Advocate Encounter   Received notification from CoverMyMeds that prior authorization for Nurtec 75MG  dispersible tablets is required/requested.   Insurance verification completed.   The patient is insured through [facility].   Per test claim: PA required; PA submitted to above mentioned insurance via CoverMyMeds Key/confirmation #/EOC [id number] Status is pending

## 2023-12-10 ENCOUNTER — Other Ambulatory Visit (HOSPITAL_COMMUNITY): Payer: Self-pay

## 2023-12-10 DIAGNOSIS — J3081 Allergic rhinitis due to animal (cat) (dog) hair and dander: Secondary | ICD-10-CM | POA: Diagnosis not present

## 2023-12-10 DIAGNOSIS — J3089 Other allergic rhinitis: Secondary | ICD-10-CM | POA: Diagnosis not present

## 2023-12-10 DIAGNOSIS — J301 Allergic rhinitis due to pollen: Secondary | ICD-10-CM | POA: Diagnosis not present

## 2023-12-10 NOTE — Telephone Encounter (Signed)
Pharmacy Patient Advocate Encounter  Received notification from [facility] that Prior Authorization for Nurtec 75MG  dispersible tablets has been APPROVED from 12/30/2022 to 12/29/2024. Ran test claim, Copay is $47.00 for 16 tablets. This test claim was processed through [facility]- copay amounts may vary at other pharmacies due to pharmacy/plan contracts, or as the patient moves through the different stages of their insurance plan.   PA #/Case ID/Reference #: [id number]

## 2023-12-11 ENCOUNTER — Other Ambulatory Visit: Payer: Self-pay | Admitting: Family Medicine

## 2023-12-18 DIAGNOSIS — J301 Allergic rhinitis due to pollen: Secondary | ICD-10-CM | POA: Diagnosis not present

## 2023-12-18 DIAGNOSIS — J3081 Allergic rhinitis due to animal (cat) (dog) hair and dander: Secondary | ICD-10-CM | POA: Diagnosis not present

## 2023-12-18 DIAGNOSIS — J3089 Other allergic rhinitis: Secondary | ICD-10-CM | POA: Diagnosis not present

## 2023-12-29 DIAGNOSIS — J301 Allergic rhinitis due to pollen: Secondary | ICD-10-CM | POA: Diagnosis not present

## 2023-12-29 DIAGNOSIS — J3089 Other allergic rhinitis: Secondary | ICD-10-CM | POA: Diagnosis not present

## 2023-12-29 DIAGNOSIS — J3081 Allergic rhinitis due to animal (cat) (dog) hair and dander: Secondary | ICD-10-CM | POA: Diagnosis not present

## 2024-01-06 DIAGNOSIS — J301 Allergic rhinitis due to pollen: Secondary | ICD-10-CM | POA: Diagnosis not present

## 2024-01-06 DIAGNOSIS — J3089 Other allergic rhinitis: Secondary | ICD-10-CM | POA: Diagnosis not present

## 2024-01-06 DIAGNOSIS — J3081 Allergic rhinitis due to animal (cat) (dog) hair and dander: Secondary | ICD-10-CM | POA: Diagnosis not present

## 2024-01-12 DIAGNOSIS — J3089 Other allergic rhinitis: Secondary | ICD-10-CM | POA: Diagnosis not present

## 2024-01-12 DIAGNOSIS — J3081 Allergic rhinitis due to animal (cat) (dog) hair and dander: Secondary | ICD-10-CM | POA: Diagnosis not present

## 2024-01-12 DIAGNOSIS — J301 Allergic rhinitis due to pollen: Secondary | ICD-10-CM | POA: Diagnosis not present

## 2024-01-16 DIAGNOSIS — L821 Other seborrheic keratosis: Secondary | ICD-10-CM | POA: Diagnosis not present

## 2024-01-16 DIAGNOSIS — L905 Scar conditions and fibrosis of skin: Secondary | ICD-10-CM | POA: Diagnosis not present

## 2024-01-16 DIAGNOSIS — L111 Transient acantholytic dermatosis [Grover]: Secondary | ICD-10-CM | POA: Diagnosis not present

## 2024-01-16 DIAGNOSIS — L570 Actinic keratosis: Secondary | ICD-10-CM | POA: Diagnosis not present

## 2024-01-16 DIAGNOSIS — L820 Inflamed seborrheic keratosis: Secondary | ICD-10-CM | POA: Diagnosis not present

## 2024-01-16 DIAGNOSIS — D1801 Hemangioma of skin and subcutaneous tissue: Secondary | ICD-10-CM | POA: Diagnosis not present

## 2024-01-19 DIAGNOSIS — J019 Acute sinusitis, unspecified: Secondary | ICD-10-CM | POA: Diagnosis not present

## 2024-01-21 ENCOUNTER — Ambulatory Visit: Payer: Medicare HMO | Admitting: Neurology

## 2024-01-26 DIAGNOSIS — J3089 Other allergic rhinitis: Secondary | ICD-10-CM | POA: Diagnosis not present

## 2024-01-26 DIAGNOSIS — J3081 Allergic rhinitis due to animal (cat) (dog) hair and dander: Secondary | ICD-10-CM | POA: Diagnosis not present

## 2024-01-26 DIAGNOSIS — J301 Allergic rhinitis due to pollen: Secondary | ICD-10-CM | POA: Diagnosis not present

## 2024-01-30 ENCOUNTER — Encounter: Payer: Self-pay | Admitting: Neurology

## 2024-01-30 ENCOUNTER — Ambulatory Visit (INDEPENDENT_AMBULATORY_CARE_PROVIDER_SITE_OTHER): Payer: Medicare HMO | Admitting: Neurology

## 2024-01-30 VITALS — BP 134/60 | HR 66 | Ht 63.0 in | Wt 138.2 lb

## 2024-01-30 DIAGNOSIS — G43009 Migraine without aura, not intractable, without status migrainosus: Secondary | ICD-10-CM | POA: Diagnosis not present

## 2024-01-30 DIAGNOSIS — R5383 Other fatigue: Secondary | ICD-10-CM | POA: Diagnosis not present

## 2024-01-30 DIAGNOSIS — R413 Other amnesia: Secondary | ICD-10-CM

## 2024-01-30 DIAGNOSIS — G4733 Obstructive sleep apnea (adult) (pediatric): Secondary | ICD-10-CM | POA: Diagnosis not present

## 2024-01-30 DIAGNOSIS — E559 Vitamin D deficiency, unspecified: Secondary | ICD-10-CM

## 2024-01-30 DIAGNOSIS — E538 Deficiency of other specified B group vitamins: Secondary | ICD-10-CM | POA: Diagnosis not present

## 2024-01-30 DIAGNOSIS — R519 Headache, unspecified: Secondary | ICD-10-CM

## 2024-01-30 DIAGNOSIS — E519 Thiamine deficiency, unspecified: Secondary | ICD-10-CM

## 2024-01-30 DIAGNOSIS — R251 Tremor, unspecified: Secondary | ICD-10-CM | POA: Diagnosis not present

## 2024-01-30 DIAGNOSIS — R51 Headache with orthostatic component, not elsewhere classified: Secondary | ICD-10-CM

## 2024-01-30 NOTE — Patient Instructions (Addendum)
Blood work today MRI of the brain w/wo contrast Recommend psychiatry and therapy - please discuss with primary care Recommend a psychiatrist to see if medication changes can help Sleep apnea untreated: at work had 90 events during REM sleep, will send back to Dr. Wynona Neat with encouragement for cpap or possibly dental device or other  Sleep Apnea  Sleep apnea is a condition that affects your breathing while you are sleeping. Your tongue or soft tissue in your throat may block the flow of air while you sleep. You may have shallow breathing or stop breathing for short periods of time. People with sleep apnea may snore loudly. There are three kinds of sleep apnea: Obstructive sleep apnea. This kind is caused by a blocked or collapsed airway. This is the most common. Central sleep apnea. This kind happens when the part of the brain that controls breathing does not send the correct signals to the muscles that control breathing. Mixed sleep apnea. This is a combination of obstructive and central sleep apnea. What are the causes? The most common cause of sleep apnea is a collapsed or blocked airway. What increases the risk? Being very overweight. Having family members with sleep apnea. Having a tongue or tonsils that are larger than normal. Having a small airway or jaw problems. Being older. What are the signs or symptoms? Loud snoring. Restless sleep. Trouble staying asleep. Being sleepy or tired during the day. Waking up gasping or choking. Having a headache in the morning. Mood swings. Having a hard time remembering things and concentrating. How is this diagnosed? A medical history. A physical exam. A sleep study. This is also called a polysomnography test. This test is done at a sleep lab or in your home while you are sleeping. How is this treated? Treatment may include: Sleeping on your side. Losing weight if you're overweight. Wearing an oral appliance. This is a mouthpiece that  moves your lower jaw forward. Using a positive airway pressure (PAP) device to keep your airways open while you sleep, such as: A continuous positive airway pressure (CPAP) device. This device gives forced air through a mask when you breathe out. This keeps your airways open. A bilevel positive airway pressure (BIPAP) device. This device gives forced air through a mask when you breathe in and when you breathe out to keep your airways open. Having surgery if other treatments do not work. If your sleep apnea is not treated, you may be at risk for: Heart failure. Heart attack. Stroke. Type 2 diabetes or a problem with your blood sugar called insulin resistance. Follow these instructions at home: Medicines Take your medicines only as told by your health care provider. Avoid alcohol, medicines to help you relax, and certain pain medicines. These may make sleep apnea worse. General instructions Do not smoke, vape, or use products with nicotine or tobacco in them. If you need help quitting, talk with your provider. If you were given a PAP device to open your airway while you sleep, use it as told by your provider. If you're having surgery, make sure to tell your provider you have sleep apnea. You may need to bring your PAP device with you. Contact a health care provider if: The PAP device that you were given to use during sleep bothers you or does not seem to be working. You do not feel better or you feel worse. Get help right away if: You have trouble breathing. You have chest pain. You have trouble talking. One side of your  body feels weak. A part of your face is hanging down. These symptoms may be an emergency. Call 911 right away. Do not wait to see if the symptoms will go away. Do not drive yourself to the hospital. This information is not intended to replace advice given to you by your health care provider. Make sure you discuss any questions you have with your health care  provider. Document Revised: 09/18/2023 Document Reviewed: 02/20/2023 Elsevier Patient Education  2024 [employer].

## 2024-01-30 NOTE — Progress Notes (Unsigned)
[id number]    Provider:  Dr Lucia Gaskins Requesting Provider: Willow Ora, MD Primary Care Provider:  Willow Ora, MD  CC:  migraines and brain fog  HPI:  Morgan [name] is a 73 y.o. female here as requested by Willow Ora, MD for migraines. Nurtec helped. Had side effects to sumatriptan, maxalt. The migraines are on the right side. These headaches started worsening in may, may have had headaches in the past but worsening, these are right sided, behind the eye, throbbing, moderate to severe pain, light sensitivity, maybe some nausea. She had 5 migraine days a month. She toold tylenol and also nurtec. She states the headaches since 05-18-2024 but MRI in 2022 was for headaches. She does not like to be in loud crowded places makes her uncomfortable, she has lost interest in her hobbies, she just went through dementia with her brother which was extremely difficult. She doesn't feel right, she feels shakiy, doesn't want to socialize, she may have a little trouble thinking of workds but performing all her ADLs and IADLs and lives alone. Headaches under control with headaches but "doesn't feel right". She has been on cymbalta for depression as we discussed depressiona nd stress, they tried he ron trintellix and she couldn't tolerate coming off of the cymbalta. Brother passed away last 2023-05-19(around the time her headaches started?). She wake up with headache. She has sleep apnea and is not treated. She is tired, she wakes with morning headache, not refreshed. Notes also show major depression. She is shaky, feels weak, never feels good. Brain fog. Saw cariolody and ophthalmologist. No other focal neurologic deficits, associated symptoms, inciting events or modifiable factors.   Reviewed notes, labs and imaging from outside physicians, which showed:     Latest Ref Rng & Units 01/30/2024   11:43 AM 07/11/2023   11:03 AM 01/01/2023    5:06 AM  CBC  WBC 3.4 - 10.8 x10E3/uL 8.6  6.6     Hemoglobin 11.1 - 15.9 g/dL 47.8  29.5  62.1   Hematocrit 34.0 - 46.6 % 38.6  35.4  33.0   Platelets 150 - 450 x10E3/uL 342  311.0        Latest Ref Rng & Units 01/30/2024   11:43 AM 08/13/2023    8:31 AM 07/11/2023   11:03 AM  CMP  Glucose 70 - 99 mg/dL 97  98  308   BUN 8 - 27 mg/dL 23  29  25    Creatinine 0.57 - 1.00 mg/dL 6.57  8.46  9.62   Sodium 134 - 144 mmol/L 144  138  140   Potassium 3.5 - 5.2 mmol/L 4.6  4.2  4.7   Chloride 96 - 106 mmol/L 104  104  104   CO2 20 - 29 mmol/L 25  27  29    Calcium 8.7 - 10.3 mg/dL 9.8  9.6  9.9   Total Protein 6.0 - 8.5 g/dL 7.3   6.9   Total Bilirubin 0.0 - 1.2 mg/dL 0.5   0.6   Alkaline Phos 44 - 121 IU/L 103   79   AST 0 - 40 IU/L 24   17   ALT 0 - 32 IU/L 17   12       03/12/2021: CLINICAL DATA:  Headache.   EXAM: MRI HEAD WITHOUT AND WITH CONTRAST   TECHNIQUE: Multiplanar, multiecho pulse sequences of the brain and surrounding structures were obtained without and with intravenous contrast.   CONTRAST:  14mL MULTIHANCE GADOBENATE DIMEGLUMINE 529 MG/ML IV SOLN   COMPARISON:  MRI head 12/17/2014   FINDINGS: Brain: Mild atrophy, with progression since 2015. Moderate white matter changes also with progression.   Negative for acute infarct, hemorrhage, mass. Normal enhancement following contrast infusion.   Vascular: Normal arterial flow voids.   Skull and upper cervical spine: No focal skeletal lesions.   Sinuses/Orbits: Mucosal edema paranasal sinuses.  Negative orbit   Other: None   IMPRESSION: Mild atrophy and moderate white matter changes most likely chronic microvascular ischemia. No acute abnormality.   Review of Systems: Patient complains of symptoms per HPI as well as the following symptoms none. Pertinent negatives and positives per HPI. All others negative.   Social History   Socioeconomic History   Marital status: Widowed    Spouse name: Not on file   Number of children: 1   Years of education:  Not on file   Highest education level: Bachelor's degree (e.g., BA, AB, BS)  Occupational History    Comment: retired    Occupation: Retired Photographer  Tobacco Use   Smoking status: Never    Passive exposure: Never   Smokeless tobacco: Never  Vaping Use   Vaping status: Never Used  Substance and Sexual Activity   Alcohol use: Yes    Comment: rare   Drug use: No   Sexual activity: Yes    Comment: lives with husband, 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 No major dietary restrictions  Other Topics Concern   Not on file  Social History Narrative   Right handed   Caffeine use: 1/2 caffeine in the morning, glass of tea with meal once per day   Lives alone   Social Drivers of Health   Financial Resource Strain: Low Risk  (08/10/2023)   Overall Financial Resource Strain (CARDIA)    Difficulty of Paying Living Expenses: Not hard at all  Food Insecurity: No Food Insecurity (08/10/2023)   Hunger Vital Sign    Worried About Running Out of Food in the Last Year: Never true    Ran Out of Food in the Last Year: Never true  Transportation Needs: No Transportation Needs (08/10/2023)   PRAPARE - Administrator, Civil Service (Medical): No    Lack of Transportation (Non-Medical): No  Physical Activity: Insufficiently Active (08/10/2023)   Exercise Vital Sign    Days of Exercise per Week: 4 days    Minutes of Exercise per Session: 20 min  Stress: No Stress Concern Present (08/10/2023)   [employer] of Occupational Health - Occupational Stress Questionnaire    Feeling of Stress : Not at all  Recent Concern: Stress - Stress Concern Present (05/19/2023)   [employer] of Occupational Health - Occupational Stress Questionnaire    Feeling of Stress : Rather much  Social Connections: Moderately Isolated (08/10/2023)   Social Connection and Isolation Panel [NHANES]    Frequency of Communication with Friends and Family: Twice a week    Frequency of Social Gatherings with Friends  and Family: Once a week    Attends Religious Services: Never    Database administrator or Organizations: No    Attends Banker Meetings: 1 to 4 times per year    Marital Status: Widowed  Intimate Partner Violence: Not At Risk (05/19/2023)   Humiliation, Afraid, Rape, and Kick questionnaire    Fear of Current or Ex-Partner: No    Emotionally Abused: No    Physically Abused: No  Sexually Abused: No    Family History  Problem Relation Age of Onset   Macular degeneration Mother    Heart attack Father    Hyperlipidemia Father    Heart disease Father    Diabetes Father    Brain cancer Father    Diabetes Brother    Breast cancer Maternal Aunt    Breast cancer Paternal Aunt    Diabetes Paternal Grandfather    Migraines Daughter    Pulmonary embolism Daughter    Arthritis Other    Colon polyps Neg Hx    Colon cancer Neg Hx    Esophageal cancer Neg Hx    Rectal cancer Neg Hx    Stomach cancer Neg Hx    Crohn's disease Neg Hx    Ulcerative colitis Neg Hx     Past Medical History:  Diagnosis Date   Allergy    Anxiety and depression 02/22/2014   Arthritis    Cancer (HCC) 10/2022   right kidney   Depression    Diastolic dysfunction    Negative Nuclear Stress Test 08/09/12.   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 Wynona Neat pulm   OSA (obstructive sleep apnea)    Mild. No CPAP.   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   Colon polyps 11/12/2019   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   GAD (generalized anxiety disorder) 02/22/2014   Mild mitral regurgitation by prior echocardiogram 07/24/2010   Diastolic dysfunction 06/26/2010   Mixed hyperlipidemia 07/14/2007   Acquired hypothyroidism 06/29/2007   Allergic rhinitis 06/29/2007   Osteoarthritis 06/29/2007    Past Surgical History:  Procedure Laterality Date   COLONOSCOPY     POLYPECTOMY     Colon   REPLACEMENT TOTAL KNEE Right    ROBOT ASSISTED LAPAROSCOPIC NEPHRECTOMY Right 12/31/2022   Procedure: XI ROBOTIC ASSISTED LAPAROSCOPIC NEPHRECTOMY;  Surgeon: Rene Paci, MD;  Location: [facility];  Service: Urology;  Laterality: Right;  3 HRS   TOTAL KNEE ARTHROPLASTY Right 04/22/2007   TOTAL KNEE ARTHROPLASTY  11/30/2012   Procedure: TOTAL KNEE ARTHROPLASTY;  Surgeon: Loanne Drilling, MD;  Location: [facility];  Service: Orthopedics;  Laterality: Left;    Current Outpatient Medications  Medication Sig Dispense Refill   atorvastatin (LIPITOR) 20 MG tablet Take 1 tablet (20 mg total) by mouth at bedtime. TAKE 1 TABLET BY MOUTH EVERY DAY 90 tablet 3   azelastine (ASTELIN) 0.1 % nasal spray Place 2 sprays into both nostrils daily as needed for allergies.     diazepam (VALIUM) 5 MG tablet Take 0.5-1 tablets (2.5-5 mg total) by mouth at bedtime as needed for sedation. 30 tablet 1   DULoxetine (CYMBALTA) 30 MG capsule Take 1 capsule (30 mg total)  by mouth daily. 90 capsule 1   EPINEPHrine 0.3 mg/0.3 mL IJ SOAJ injection Inject 0.3 mg into the muscle as needed for anaphylaxis. On hand     levothyroxine (SYNTHROID) 50 MCG tablet TAKE 1 TABLET (50 MCG TOTAL) BY MOUTH DAILY BEFORE BREAKFAST. EVERY TUESDAY AND THURSDAY 30 tablet 11   levothyroxine (SYNTHROID) 75 MCG tablet  TAKE 1 TABLET BY MOUTH EVERY DAY 90 tablet 3   Rimegepant Sulfate (NURTEC) 75 MG TBDP Take 1 tablet (75 mg total) by mouth daily as needed (migraine). 30 tablet 1   valACYclovir (VALTREX) 1000 MG tablet Take 2 tablets (2,000 mg total) by mouth 2 (two) times daily as needed. Take for cold sores prn 20 tablet 2   No current facility-administered medications for this visit.    Allergies as of 01/30/2024 - Review Complete 01/30/2024  Allergen Reaction Noted   Simvastatin Other (See Comments) 09/13/2014   Sulfonamide derivatives Rash     Vitals: BP 134/60 (BP Location: Left Arm, Patient Position: Sitting, Cuff Size: Normal)   Pulse 66   Ht 5\' 3"  (1.6 m)   Wt 138 lb 3.2 oz (62.7 kg)   BMI 24.48 kg/m  Last Weight:  Wt Readings from Last 1 Encounters:  01/30/24 138 lb 3.2 oz (62.7 kg)   Last Height:   Ht Readings from Last 1 Encounters:  01/30/24 5\' 3"  (1.6 m)     Physical exam: Exam: Gen: NAD, conversant, well nourised, well groomed                     CV: RRR, no MRG. No Carotid Bruits. No peripheral edema, warm, nontender Eyes: Conjunctivae clear without exudates or hemorrhage  Neuro: Detailed Neurologic Exam  Speech:    Speech is normal; fluent and spontaneous with normal comprehension.  Cognition:    The patient is oriented to person, place, and time;     recent and remote memory intact;     language fluent;     normal attention, concentration,     fund of knowledge Cranial Nerves:    The pupils are equal, round, and reactive to light. The fundi are normal and spontaneous venous pulsations are present. Visual fields are full to finger confrontation. Extraocular movements are intact. Trigeminal sensation is intact and the muscles of mastication are normal. The face is symmetric. The palate elevates in the midline. Hearing intact. Voice is normal. Shoulder shrug is normal. The tongue has normal motion without fasciculations.   Coordination: nml  Gait: nml  Motor  Observation: Slight action tremor Tone:    Normal muscle tone.    Posture:    Posture is normal. normal erect    Strength:    Strength is V/V in the upper and lower limbs.      Sensation: intact to LT     Reflex Exam:  DTR's:    Deep tendon reflexes in the upper and lower extremities are normal bilaterally.   Toes:    The toes are downgoing bilaterally.   Clonus:    Clonus is absent.    Assessment/Plan: Patient is here for migraines, "foggy brain", generally not feeling well, chronic and what appears to be incompletely treated depression and anxiety, sleep apnea untreated.  We discussed her sleep apnea test as below and I highly recommended she goes back to her sleep doctor and explained to her the sequelae of untreated sleep apnea including all the symptoms she is experiencing in addition to sequelae such as stroke and  cardiovascular issues and how serious sleep apnea can be.  We will also perform MRI of the brain with and without contrast for reversible causes of memory loss, blood work today, recommended psychiatry.  Dr. Wynona Neat: Untreated sleep apnea: There were a total of 83 respiratory disturbances out of which 10 were apneas ( 6 obstructive, 0 mixed, 4 central) and 73 hypopneas. The apnea/hypopnea index (AHI) was 15.8 events/hour. The central sleep apnea index was 0.8 events/hour. The REM AHI was 90.0 events/hour and NREM AHI was 15.3 events/hour. The supine AHI was 27.5 events/hour and the non supine AHI was 9.8 supine during 33.86% of sleep. Respiratory disturbances were associated with oxygen desaturation down to a nadir of 81.0% during sleep. The mean oxygen saturation during the study was 94.3%. The cumulative time under 88% oxygen saturation was 5.5 minutes.   Blood work today MRI of the brain w/wo contrast Recommend psychiatry and therapy - please discuss with primary care ecommend a psychiatrist to see if medication changes can help Sleep apnea untreated: at work had 90  events during REM sleep, will send back to Dr. Wynona Neat with encouragement for cpap or possibly dental device or other  Orders Placed This Encounter  Procedures   MR BRAIN W WO CONTRAST   CBC with Differential/Platelets   Comprehensive metabolic panel   TSH Rfx on Abnormal to Free T4   B12 and Folate Panel   Methylmalonic acid, serum   Vitamin D, 25-hydroxy   Vitamin B1   Sedimentation rate   C-reactive protein   Ambulatory referral to Sleep Studies   No orders of the defined types were placed in this encounter.   Cc: Willow Ora, MD,  Willow Ora, MD  Naomie Dean, MD  [facility] [address] [place], [place] [address]  Phone [phone] Fax [phone]

## 2024-01-31 LAB — TSH RFX ON ABNORMAL TO FREE T4: TSH: 1.48 u[IU]/mL (ref 0.450–4.500)

## 2024-02-01 ENCOUNTER — Encounter: Payer: Self-pay | Admitting: Neurology

## 2024-02-01 NOTE — Addendum Note (Signed)
Addended by: Naomie Dean B on: 02/01/2024 12:18 PM   Modules accepted: Level of Service

## 2024-02-02 ENCOUNTER — Encounter: Payer: Self-pay | Admitting: Neurology

## 2024-02-03 ENCOUNTER — Telehealth: Payer: Self-pay | Admitting: Neurology

## 2024-02-03 NOTE — Telephone Encounter (Signed)
 sent to GI they obtain [facility] Berkley Harvey [phone]

## 2024-02-03 NOTE — Telephone Encounter (Signed)
Referral for sleep studies sent through [facility] to [facility] to see Dr. Hollice Gong. Phone: [phone], Fax: [phone]

## 2024-02-04 DIAGNOSIS — J3089 Other allergic rhinitis: Secondary | ICD-10-CM | POA: Diagnosis not present

## 2024-02-04 DIAGNOSIS — J3081 Allergic rhinitis due to animal (cat) (dog) hair and dander: Secondary | ICD-10-CM | POA: Diagnosis not present

## 2024-02-04 DIAGNOSIS — J301 Allergic rhinitis due to pollen: Secondary | ICD-10-CM | POA: Diagnosis not present

## 2024-02-05 LAB — B12 AND FOLATE PANEL
Folate: >20 ng/mL (ref >3.0)
Vitamin B-12: 996 pg/mL (ref 232–1245)

## 2024-02-05 LAB — CBC WITH DIFFERENTIAL/PLATELET
Basophils Absolute: 0 10*3/uL (ref 0.0–0.2)
Basos: 1 %
EOS (ABSOLUTE): 0.3 10*3/uL (ref 0.0–0.4)
Eos: 4 %
Hematocrit: 38.6 % (ref 34.0–46.6)
Hemoglobin: 12.6 g/dL (ref 11.1–15.9)
Immature Grans (Abs): 0 10*3/uL (ref 0.0–0.1)
Immature Granulocytes: 0 %
Lymphocytes Absolute: 2.7 10*3/uL (ref 0.7–3.1)
Lymphs: 32 %
MCH: 30.8 pg (ref 26.6–33.0)
MCHC: 32.6 g/dL (ref 31.5–35.7)
MCV: 94 fL (ref 79–97)
Monocytes Absolute: 0.5 10*3/uL (ref 0.1–0.9)
Monocytes: 6 %
Neutrophils Absolute: 5 10*3/uL (ref 1.4–7.0)
Neutrophils: 57 %
Platelets: 342 10*3/uL (ref 150–450)
RBC: 4.09 x10E6/uL (ref 3.77–5.28)
RDW: 12.9 % (ref 11.7–15.4)
WBC: 8.6 10*3/uL (ref 3.4–10.8)

## 2024-02-05 LAB — COMPREHENSIVE METABOLIC PANEL
ALT: 17 [IU]/L (ref 0–32)
AST: 24 [IU]/L (ref 0–40)
Albumin: 4.6 g/dL (ref 3.8–4.8)
Alkaline Phosphatase: 103 [IU]/L (ref 44–121)
BUN/Creatinine Ratio: 20 (ref 12–28)
BUN: 23 mg/dL (ref 8–27)
Bilirubin Total: 0.5 mg/dL (ref 0.0–1.2)
CO2: 25 mmol/L (ref 20–29)
Calcium: 9.8 mg/dL (ref 8.7–10.3)
Chloride: 104 mmol/L (ref 96–106)
Creatinine, Ser: 1.13 mg/dL — ABNORMAL HIGH (ref 0.57–1.00)
Globulin, Total: 2.7 g/dL (ref 1.5–4.5)
Glucose: 97 mg/dL (ref 70–99)
Potassium: 4.6 mmol/L (ref 3.5–5.2)
Sodium: 144 mmol/L (ref 134–144)
Total Protein: 7.3 g/dL (ref 6.0–8.5)
eGFR: 51 mL/min/{1.73_m2} — ABNORMAL LOW (ref >59)

## 2024-02-05 LAB — VITAMIN B1: Thiamine: 141.5 nmol/L (ref 66.5–200.0)

## 2024-02-05 LAB — METHYLMALONIC ACID, SERUM: Methylmalonic Acid: 275 nmol/L (ref 0–378)

## 2024-02-05 LAB — C-REACTIVE PROTEIN: CRP: 12 mg/L — ABNORMAL HIGH (ref 0–10)

## 2024-02-05 LAB — SEDIMENTATION RATE: Sed Rate: 24 mm/h (ref 0–40)

## 2024-02-05 LAB — VITAMIN D 25 HYDROXY (VIT D DEFICIENCY, FRACTURES): Vit D, 25-Hydroxy: 44.5 ng/mL (ref 30.0–100.0)

## 2024-02-10 ENCOUNTER — Encounter: Payer: Self-pay | Admitting: Neurology

## 2024-02-11 DIAGNOSIS — J3089 Other allergic rhinitis: Secondary | ICD-10-CM | POA: Diagnosis not present

## 2024-02-11 DIAGNOSIS — J301 Allergic rhinitis due to pollen: Secondary | ICD-10-CM | POA: Diagnosis not present

## 2024-02-11 DIAGNOSIS — J3081 Allergic rhinitis due to animal (cat) (dog) hair and dander: Secondary | ICD-10-CM | POA: Diagnosis not present

## 2024-02-17 DIAGNOSIS — J3089 Other allergic rhinitis: Secondary | ICD-10-CM | POA: Diagnosis not present

## 2024-02-17 DIAGNOSIS — J301 Allergic rhinitis due to pollen: Secondary | ICD-10-CM | POA: Diagnosis not present

## 2024-02-17 DIAGNOSIS — J3081 Allergic rhinitis due to animal (cat) (dog) hair and dander: Secondary | ICD-10-CM | POA: Diagnosis not present

## 2024-02-18 ENCOUNTER — Ambulatory Visit
Admission: RE | Admit: 2024-02-18 | Discharge: 2024-02-18 | Disposition: A | Discharge status: Home / Self Care | Payer: Medicare HMO | Source: Ambulatory Visit | Attending: Neurology

## 2024-02-18 DIAGNOSIS — R519 Headache, unspecified: Secondary | ICD-10-CM

## 2024-02-18 DIAGNOSIS — R413 Other amnesia: Secondary | ICD-10-CM | POA: Diagnosis not present

## 2024-02-18 DIAGNOSIS — R251 Tremor, unspecified: Secondary | ICD-10-CM | POA: Diagnosis not present

## 2024-02-18 DIAGNOSIS — R51 Headache with orthostatic component, not elsewhere classified: Secondary | ICD-10-CM | POA: Diagnosis not present

## 2024-02-18 MED ORDER — GADOPICLENOL 0.5 MMOL/ML IV SOLN
7.5000 mL | Freq: Once | INTRAVENOUS | Status: AC | PRN
  Administered 2024-02-18: 7 mL via INTRAVENOUS

## 2024-02-19 ENCOUNTER — Encounter: Payer: Self-pay | Admitting: Neurology

## 2024-02-25 DIAGNOSIS — J3081 Allergic rhinitis due to animal (cat) (dog) hair and dander: Secondary | ICD-10-CM | POA: Diagnosis not present

## 2024-02-25 DIAGNOSIS — J301 Allergic rhinitis due to pollen: Secondary | ICD-10-CM | POA: Diagnosis not present

## 2024-02-25 DIAGNOSIS — J3089 Other allergic rhinitis: Secondary | ICD-10-CM | POA: Diagnosis not present

## 2024-03-03 DIAGNOSIS — J3089 Other allergic rhinitis: Secondary | ICD-10-CM | POA: Diagnosis not present

## 2024-03-03 DIAGNOSIS — J3081 Allergic rhinitis due to animal (cat) (dog) hair and dander: Secondary | ICD-10-CM | POA: Diagnosis not present

## 2024-03-03 DIAGNOSIS — J301 Allergic rhinitis due to pollen: Secondary | ICD-10-CM | POA: Diagnosis not present

## 2024-03-12 DIAGNOSIS — J3089 Other allergic rhinitis: Secondary | ICD-10-CM | POA: Diagnosis not present

## 2024-03-12 DIAGNOSIS — J3081 Allergic rhinitis due to animal (cat) (dog) hair and dander: Secondary | ICD-10-CM | POA: Diagnosis not present

## 2024-03-12 DIAGNOSIS — J301 Allergic rhinitis due to pollen: Secondary | ICD-10-CM | POA: Diagnosis not present

## 2024-03-18 DIAGNOSIS — J301 Allergic rhinitis due to pollen: Secondary | ICD-10-CM | POA: Diagnosis not present

## 2024-03-18 DIAGNOSIS — J3089 Other allergic rhinitis: Secondary | ICD-10-CM | POA: Diagnosis not present

## 2024-03-18 DIAGNOSIS — J3081 Allergic rhinitis due to animal (cat) (dog) hair and dander: Secondary | ICD-10-CM | POA: Diagnosis not present

## 2024-03-25 DIAGNOSIS — J301 Allergic rhinitis due to pollen: Secondary | ICD-10-CM | POA: Diagnosis not present

## 2024-03-25 DIAGNOSIS — J3081 Allergic rhinitis due to animal (cat) (dog) hair and dander: Secondary | ICD-10-CM | POA: Diagnosis not present

## 2024-03-25 DIAGNOSIS — J3089 Other allergic rhinitis: Secondary | ICD-10-CM | POA: Diagnosis not present

## 2024-04-02 DIAGNOSIS — J3081 Allergic rhinitis due to animal (cat) (dog) hair and dander: Secondary | ICD-10-CM | POA: Diagnosis not present

## 2024-04-02 DIAGNOSIS — J301 Allergic rhinitis due to pollen: Secondary | ICD-10-CM | POA: Diagnosis not present

## 2024-04-02 DIAGNOSIS — J3089 Other allergic rhinitis: Secondary | ICD-10-CM | POA: Diagnosis not present

## 2024-04-09 DIAGNOSIS — J3081 Allergic rhinitis due to animal (cat) (dog) hair and dander: Secondary | ICD-10-CM | POA: Diagnosis not present

## 2024-04-09 DIAGNOSIS — J3089 Other allergic rhinitis: Secondary | ICD-10-CM | POA: Diagnosis not present

## 2024-04-09 DIAGNOSIS — J301 Allergic rhinitis due to pollen: Secondary | ICD-10-CM | POA: Diagnosis not present

## 2024-04-20 DIAGNOSIS — J3081 Allergic rhinitis due to animal (cat) (dog) hair and dander: Secondary | ICD-10-CM | POA: Diagnosis not present

## 2024-04-20 DIAGNOSIS — J3089 Other allergic rhinitis: Secondary | ICD-10-CM | POA: Diagnosis not present

## 2024-04-20 DIAGNOSIS — J301 Allergic rhinitis due to pollen: Secondary | ICD-10-CM | POA: Diagnosis not present

## 2024-04-27 DIAGNOSIS — R3915 Urgency of urination: Secondary | ICD-10-CM | POA: Diagnosis not present

## 2024-04-27 DIAGNOSIS — R3982 Chronic bladder pain: Secondary | ICD-10-CM | POA: Diagnosis not present

## 2024-04-27 DIAGNOSIS — R350 Frequency of micturition: Secondary | ICD-10-CM | POA: Diagnosis not present

## 2024-04-30 DIAGNOSIS — J3081 Allergic rhinitis due to animal (cat) (dog) hair and dander: Secondary | ICD-10-CM | POA: Diagnosis not present

## 2024-04-30 DIAGNOSIS — J3089 Other allergic rhinitis: Secondary | ICD-10-CM | POA: Diagnosis not present

## 2024-04-30 DIAGNOSIS — J301 Allergic rhinitis due to pollen: Secondary | ICD-10-CM | POA: Diagnosis not present

## 2024-05-05 DIAGNOSIS — K449 Diaphragmatic hernia without obstruction or gangrene: Secondary | ICD-10-CM | POA: Diagnosis not present

## 2024-05-05 DIAGNOSIS — I700 Atherosclerosis of aorta: Secondary | ICD-10-CM | POA: Diagnosis not present

## 2024-05-05 DIAGNOSIS — C641 Malignant neoplasm of right kidney, except renal pelvis: Secondary | ICD-10-CM | POA: Diagnosis not present

## 2024-05-06 DIAGNOSIS — J3089 Other allergic rhinitis: Secondary | ICD-10-CM | POA: Diagnosis not present

## 2024-05-06 DIAGNOSIS — J301 Allergic rhinitis due to pollen: Secondary | ICD-10-CM | POA: Diagnosis not present

## 2024-05-06 DIAGNOSIS — J3081 Allergic rhinitis due to animal (cat) (dog) hair and dander: Secondary | ICD-10-CM | POA: Diagnosis not present

## 2024-05-12 ENCOUNTER — Other Ambulatory Visit: Payer: Self-pay | Admitting: Family Medicine

## 2024-05-12 DIAGNOSIS — E782 Mixed hyperlipidemia: Secondary | ICD-10-CM

## 2024-05-14 DIAGNOSIS — J3081 Allergic rhinitis due to animal (cat) (dog) hair and dander: Secondary | ICD-10-CM | POA: Diagnosis not present

## 2024-05-14 DIAGNOSIS — J301 Allergic rhinitis due to pollen: Secondary | ICD-10-CM | POA: Diagnosis not present

## 2024-05-14 DIAGNOSIS — J3089 Other allergic rhinitis: Secondary | ICD-10-CM | POA: Diagnosis not present

## 2024-05-20 DIAGNOSIS — C641 Malignant neoplasm of right kidney, except renal pelvis: Secondary | ICD-10-CM | POA: Diagnosis not present

## 2024-05-24 ENCOUNTER — Ambulatory Visit: Payer: Medicare HMO

## 2024-05-25 DIAGNOSIS — J3089 Other allergic rhinitis: Secondary | ICD-10-CM | POA: Diagnosis not present

## 2024-05-25 DIAGNOSIS — J301 Allergic rhinitis due to pollen: Secondary | ICD-10-CM | POA: Diagnosis not present

## 2024-05-25 DIAGNOSIS — J3081 Allergic rhinitis due to animal (cat) (dog) hair and dander: Secondary | ICD-10-CM | POA: Diagnosis not present

## 2024-05-26 ENCOUNTER — Other Ambulatory Visit: Payer: Self-pay | Admitting: Family Medicine

## 2024-05-26 DIAGNOSIS — Z1231 Encounter for screening mammogram for malignant neoplasm of breast: Secondary | ICD-10-CM

## 2024-06-03 DIAGNOSIS — J301 Allergic rhinitis due to pollen: Secondary | ICD-10-CM | POA: Diagnosis not present

## 2024-06-03 DIAGNOSIS — J3089 Other allergic rhinitis: Secondary | ICD-10-CM | POA: Diagnosis not present

## 2024-06-03 DIAGNOSIS — J3081 Allergic rhinitis due to animal (cat) (dog) hair and dander: Secondary | ICD-10-CM | POA: Diagnosis not present

## 2024-06-08 ENCOUNTER — Ambulatory Visit

## 2024-06-14 DIAGNOSIS — J019 Acute sinusitis, unspecified: Secondary | ICD-10-CM | POA: Diagnosis not present

## 2024-06-15 ENCOUNTER — Ambulatory Visit
Admission: RE | Admit: 2024-06-15 | Discharge: 2024-06-15 | Disposition: A | Discharge status: Home / Self Care | Source: Ambulatory Visit | Attending: Family Medicine | Admitting: Family Medicine

## 2024-06-15 DIAGNOSIS — Z1231 Encounter for screening mammogram for malignant neoplasm of breast: Secondary | ICD-10-CM | POA: Diagnosis not present

## 2024-06-16 DIAGNOSIS — J301 Allergic rhinitis due to pollen: Secondary | ICD-10-CM | POA: Diagnosis not present

## 2024-06-16 DIAGNOSIS — J3089 Other allergic rhinitis: Secondary | ICD-10-CM | POA: Diagnosis not present

## 2024-06-16 DIAGNOSIS — J3081 Allergic rhinitis due to animal (cat) (dog) hair and dander: Secondary | ICD-10-CM | POA: Diagnosis not present

## 2024-06-24 DIAGNOSIS — J3089 Other allergic rhinitis: Secondary | ICD-10-CM | POA: Diagnosis not present

## 2024-06-24 DIAGNOSIS — J3081 Allergic rhinitis due to animal (cat) (dog) hair and dander: Secondary | ICD-10-CM | POA: Diagnosis not present

## 2024-06-24 DIAGNOSIS — J301 Allergic rhinitis due to pollen: Secondary | ICD-10-CM | POA: Diagnosis not present

## 2024-06-30 DIAGNOSIS — J3089 Other allergic rhinitis: Secondary | ICD-10-CM | POA: Diagnosis not present

## 2024-06-30 DIAGNOSIS — J301 Allergic rhinitis due to pollen: Secondary | ICD-10-CM | POA: Diagnosis not present

## 2024-06-30 DIAGNOSIS — J3081 Allergic rhinitis due to animal (cat) (dog) hair and dander: Secondary | ICD-10-CM | POA: Diagnosis not present

## 2024-07-08 DIAGNOSIS — J3089 Other allergic rhinitis: Secondary | ICD-10-CM | POA: Diagnosis not present

## 2024-07-08 DIAGNOSIS — J3081 Allergic rhinitis due to animal (cat) (dog) hair and dander: Secondary | ICD-10-CM | POA: Diagnosis not present

## 2024-07-08 DIAGNOSIS — J301 Allergic rhinitis due to pollen: Secondary | ICD-10-CM | POA: Diagnosis not present

## 2024-07-15 ENCOUNTER — Other Ambulatory Visit (HOSPITAL_COMMUNITY): Payer: Self-pay

## 2024-07-15 ENCOUNTER — Ambulatory Visit: Admitting: Family Medicine

## 2024-07-15 ENCOUNTER — Telehealth: Payer: Self-pay

## 2024-07-15 ENCOUNTER — Encounter: Payer: Self-pay | Admitting: Family Medicine

## 2024-07-15 VITALS — BP 134/70 | HR 70 | Temp 98.1°F | Ht 63.0 in | Wt 136.0 lb

## 2024-07-15 DIAGNOSIS — F339 Major depressive disorder, recurrent, unspecified: Secondary | ICD-10-CM

## 2024-07-15 DIAGNOSIS — R7301 Impaired fasting glucose: Secondary | ICD-10-CM

## 2024-07-15 DIAGNOSIS — F411 Generalized anxiety disorder: Secondary | ICD-10-CM | POA: Diagnosis not present

## 2024-07-15 DIAGNOSIS — F5102 Adjustment insomnia: Secondary | ICD-10-CM | POA: Diagnosis not present

## 2024-07-15 DIAGNOSIS — J301 Allergic rhinitis due to pollen: Secondary | ICD-10-CM | POA: Diagnosis not present

## 2024-07-15 DIAGNOSIS — G43009 Migraine without aura, not intractable, without status migrainosus: Secondary | ICD-10-CM | POA: Insufficient documentation

## 2024-07-15 DIAGNOSIS — Z780 Asymptomatic menopausal state: Secondary | ICD-10-CM

## 2024-07-15 DIAGNOSIS — C641 Malignant neoplasm of right kidney, except renal pelvis: Secondary | ICD-10-CM

## 2024-07-15 DIAGNOSIS — M810 Age-related osteoporosis without current pathological fracture: Secondary | ICD-10-CM

## 2024-07-15 DIAGNOSIS — Z0001 Encounter for general adult medical examination with abnormal findings: Secondary | ICD-10-CM | POA: Diagnosis not present

## 2024-07-15 DIAGNOSIS — G4733 Obstructive sleep apnea (adult) (pediatric): Secondary | ICD-10-CM

## 2024-07-15 DIAGNOSIS — E559 Vitamin D deficiency, unspecified: Secondary | ICD-10-CM | POA: Diagnosis not present

## 2024-07-15 DIAGNOSIS — E039 Hypothyroidism, unspecified: Secondary | ICD-10-CM | POA: Diagnosis not present

## 2024-07-15 DIAGNOSIS — E538 Deficiency of other specified B group vitamins: Secondary | ICD-10-CM

## 2024-07-15 DIAGNOSIS — J3089 Other allergic rhinitis: Secondary | ICD-10-CM | POA: Diagnosis not present

## 2024-07-15 DIAGNOSIS — J3081 Allergic rhinitis due to animal (cat) (dog) hair and dander: Secondary | ICD-10-CM | POA: Diagnosis not present

## 2024-07-15 LAB — COMPREHENSIVE METABOLIC PANEL WITH GFR
ALT: 13 U/L (ref 0–35)
AST: 20 U/L (ref 0–37)
Albumin: 4.2 g/dL (ref 3.5–5.2)
Alkaline Phosphatase: 71 U/L (ref 39–117)
BUN: 24 mg/dL — ABNORMAL HIGH (ref 6–23)
CO2: 28 meq/L (ref 19–32)
Calcium: 9.6 mg/dL (ref 8.4–10.5)
Chloride: 104 meq/L (ref 96–112)
Creatinine, Ser: 1.31 mg/dL — ABNORMAL HIGH (ref 0.40–1.20)
GFR: 39.98 mL/min — ABNORMAL LOW (ref >60.00)
Glucose, Bld: 110 mg/dL — ABNORMAL HIGH (ref 70–99)
Potassium: 4.5 meq/L (ref 3.5–5.1)
Sodium: 140 meq/L (ref 135–145)
Total Bilirubin: 0.7 mg/dL (ref 0.2–1.2)
Total Protein: 6.4 g/dL (ref 6.0–8.3)

## 2024-07-15 LAB — CBC WITH DIFFERENTIAL/PLATELET
Basophils Absolute: 0 K/uL (ref 0.0–0.1)
Basophils Relative: 0.9 % (ref 0.0–3.0)
Eosinophils Absolute: 0.3 K/uL (ref 0.0–0.7)
Eosinophils Relative: 5.9 % — ABNORMAL HIGH (ref 0.0–5.0)
HCT: 35.1 % — ABNORMAL LOW (ref 36.0–46.0)
Hemoglobin: 11.7 g/dL — ABNORMAL LOW (ref 12.0–15.0)
Lymphocytes Relative: 38.5 % (ref 12.0–46.0)
Lymphs Abs: 2.2 K/uL (ref 0.7–4.0)
MCHC: 33.3 g/dL (ref 30.0–36.0)
MCV: 92.8 fl (ref 78.0–100.0)
Monocytes Absolute: 0.3 K/uL (ref 0.1–1.0)
Monocytes Relative: 5.3 % (ref 3.0–12.0)
Neutro Abs: 2.8 K/uL (ref 1.4–7.7)
Neutrophils Relative %: 49.4 % (ref 43.0–77.0)
Platelets: 283 K/uL (ref 150.0–400.0)
RBC: 3.78 Mil/uL — ABNORMAL LOW (ref 3.87–5.11)
RDW: 14.1 % (ref 11.5–15.5)
WBC: 5.7 K/uL (ref 4.0–10.5)

## 2024-07-15 LAB — LIPID PANEL
Cholesterol: 187 mg/dL (ref 0–200)
HDL: 69 mg/dL (ref >39.00)
LDL Cholesterol: 106 mg/dL — ABNORMAL HIGH (ref 0–99)
NonHDL: 118.4
Total CHOL/HDL Ratio: 3
Triglycerides: 60 mg/dL (ref 0.0–149.0)
VLDL: 12 mg/dL (ref 0.0–40.0)

## 2024-07-15 LAB — VITAMIN D 25 HYDROXY (VIT D DEFICIENCY, FRACTURES): VITD: 37.67 ng/mL (ref 30.00–100.00)

## 2024-07-15 LAB — VITAMIN B12: Vitamin B-12: 791 pg/mL (ref 211–911)

## 2024-07-15 LAB — HEMOGLOBIN A1C: Hgb A1c MFr Bld: 6.2 % (ref 4.6–6.5)

## 2024-07-15 LAB — TSH: TSH: 0.58 u[IU]/mL (ref 0.35–5.50)

## 2024-07-15 MED ORDER — DULOXETINE HCL 30 MG PO CPEP: 30.0000 mg | ORAL_CAPSULE | Freq: Every day | ORAL | 3 refills | Status: DC

## 2024-07-15 MED ORDER — DIAZEPAM 5 MG PO TABS
2.5000 mg | ORAL_TABLET | Freq: Every evening | ORAL | 1 refills | Status: AC | PRN
Start: 2024-07-15 — End: ?

## 2024-07-15 MED ORDER — DULOXETINE HCL 20 MG PO CPEP: 40.0000 mg | ORAL_CAPSULE | Freq: Every day | ORAL | 3 refills | Status: DC

## 2024-07-15 NOTE — Patient Instructions (Signed)
Please return in 12 months for your annual complete physical; please come fasting.   I will release your lab results to you on your MyChart account with further instructions. You may see the results before I do, but when I review them I will send you a message with my report or have my assistant call you if things need to be discussed. Please reply to my message with any questions. Thank you!   If you have any questions or concerns, please don't hesitate to send me a message via MyChart or call the office at [phone]. Thank you for visiting with us today! It's our pleasure caring for you.  

## 2024-07-15 NOTE — Progress Notes (Signed)
 Subjective  Chief Complaint  Patient presents with   Annual Exam    Pt here for Annual Exam and is not currently fasting. DEXA has not been done yet   Hyperlipidemia    HPI: Morgan [name] is a 73 y.o. female who presents to [facility] [place] today for a Female Wellness Visit. She also has the concerns and/or needs as listed above in the chief complaint. These will be addressed in addition to the Health Maintenance Visit.   Wellness Visit: annual visit with health maintenance review and exam  HM: mammo up to date. CRC screen with negative cologuard is current. Doing well overall. Walking dog for exercise. Needs eye exam. Imm up to date Chronic disease f/u and/or acute problem visit: (deemed necessary to be done in addition to the wellness visit): Discussed the use of AI scribe software for clinical note transcription with the patient, who gave verbal consent to proceed.  History of Present Illness Morgan [name] is a 73 year old female who presents with fatigue and weakness.  She experiences persistent fatigue and weakness despite maintaining physical activity, such as walking her dog and using five-pound weights. She tracks her sleep with an Apple watch and has not identified significant issues. A sleep study in 2022 led to the use of a CPAP machine, which she found difficult to tolerate due to mouth breathing.  She is currently taking thyroid  medication, specifically 75 mcg daily and 50 mcg on Tuesdays and Thursdays, and is concerned about whether her thyroid  levels might need adjustment.  She has experienced indigestion problems, which improved after a 14-day course of Prilosec. A CT scan showed a very small hiatal hernia.  In January, she had severe headaches following a cold and sinus infection, which resolved after treatment with amoxicillin  and a steroid dose pack. A brain MRI in early February was performed, and the patient recalls being told  there were no significant problems. Reviewed neuro eval for headaches. Now resolved.   She discusses her mood, noting that current events and reduced social activities, such as playing music with groups, have affected her. She is planning to attend [place] events, which she finds beneficial. She is currently on duloxetine  and is considering adjusting the dose to 40 mg, as 60 mg made her feel unwell.  She occasionally takes diazepam  for sleep issues, breaking the tablet into smaller doses. She also mentions using a [facility] migraine pill containing aspirin, acetaminophen , and caffeine to help with energy levels.     Assessment  1. Encounter for well adult exam with abnormal findings   2. Adjustment insomnia   3. Acquired hypothyroidism   4. Clear cell renal cell carcinoma, right (HCC)   5. GAD (generalized anxiety disorder)   6. IFG (impaired fasting glucose)   7. Major depression, recurrent, chronic (HCC)   8. Obstructive sleep apnea   9. Osteoporosis, post-menopausal   10. Migraine without aura and without status migrainosus, not intractable   11. Asymptomatic menopausal state   12. Vitamin D  deficiency   13. Vitamin B12 deficiency      Plan  Female Wellness Visit: Age appropriate Health Maintenance and Prevention measures were discussed with patient. Included topics are cancer screening recommendations, ways to keep healthy (see AVS) including dietary and exercise recommendations, regular eye and dental care, use of seat belts, and avoidance of moderate alcohol use and tobacco use. Mammo and crc screen up to date BMI: discussed patient's BMI and encouraged positive lifestyle  modifications to help get to or maintain a target BMI. HM needs and immunizations were addressed and ordered. See below for orders. See HM and immunization section for updates. Routine labs and screening tests ordered including cmp, cbc and lipids where appropriate. Discussed recommendations regarding Vit D and  calcium  supplementation (see AVS)  Chronic disease management visit and/or acute problem visit: Fatigue: check labs: ? Thyroid  related or depressive sxs. Will increase cymbalta  to 40mg  daily and monitor.  Low thyroid  due for recheck.  IFG: recheck A1c. No sxs. Eating well and exercising.  OSA but couldn't tolerated cpap. If needed, could test again.  Headaches have resolved.  Recheck dexa and treat if indicated. H/o renal clear cell w/ recent stable f/u. S/p nephrectomy. Monitoring renal function.   Follow up: 12 mo for cpe, sooner if needed  Orders Placed This Encounter  Procedures   DG Bone Density   CBC with Differential/Platelet   Comprehensive metabolic panel with GFR   Lipid panel   TSH   Hemoglobin A1c   VITAMIN D  25 Hydroxy (Vit-D Deficiency, Fractures)   Vitamin B12   Meds ordered this encounter  Medications   DISCONTD: DULoxetine  (CYMBALTA ) 30 MG capsule    Sig: Take 1 capsule (30 mg total) by mouth daily.    Dispense:  90 capsule    Refill:  3   diazepam  (VALIUM ) 5 MG tablet    Sig: Take 0.5-1 tablets (2.5-5 mg total) by mouth at bedtime as needed for sedation.    Dispense:  30 tablet    Refill:  1    This request is for a new prescription for a controlled substance as required by Federal/State law.   DULoxetine  (CYMBALTA ) 20 MG capsule    Sig: Take 2 capsules (40 mg total) by mouth daily.    Dispense:  180 capsule    Refill:  3      Body mass index is 24.09 kg/m. Wt Readings from Last 3 Encounters:  07/15/24 136 lb (61.7 kg)  01/30/24 138 lb 3.2 oz (62.7 kg)  10/01/23 139 lb 9.6 oz (63.3 kg)     Patient Active Problem List   Diagnosis Date Noted   Migraine without aura and without status migrainosus, not intractable 07/15/2024    Priority: High    Dr. Ines, stable brain MRI, neg lab evaluation    Chest pain 07/11/2023    Priority: High    2023: Cardiology evaluation: Echocardiogram ok and cardiac NM PET scan normal.     Clear cell renal cell  carcinoma, right (HCC) 02/07/2023    Priority: High    Right nephrectomy January 2024    S/p nephrectomy 02/07/2023    Priority: High   Mixed hyperlipidemia 07/14/2007    Priority: High   Acquired hypothyroidism 06/29/2007    Priority: High   Obstructive sleep apnea 12/03/2021    Priority: Medium     Dr. Neda pulm, sleep study 10/2021. Did not tolerate CPAP    Major depression, recurrent, chronic (HCC) 09/25/2020    Priority: Medium     On cymbalta  30; started about 20 years ago. Has failed prozac , wellbutrin  (jittery) and higher dose cymbalta  in past. Failed seroquel  adjuvant 2024    IFG (impaired fasting glucose) 05/19/2020    Priority: Medium    Osteoporosis, post-menopausal 11/12/2019    Priority: Medium     Dexa 11/2019: T = - 2.6 right femur, worse than prior dexa 2017; recommend starting treatment. Pt deferred.  Dexa 04/2022: lowest T - -  2.3, improved. Recheck 2 years    Colon polyps 11/12/2019    Priority: Medium     Dr. 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    Priority: Low   Mild mitral regurgitation by prior echocardiogram 07/24/2010    Priority: Low   Allergic rhinitis 06/29/2007    Priority: Low   Health Maintenance  Topic Date Due   DEXA SCAN  04/29/2024   Medicare Annual Wellness (AWV)  05/18/2024   COVID-19 Vaccine (6 - 2024-25 season) 07/31/2024 (Originally 08/31/2023)   INFLUENZA VACCINE  07/30/2024   MAMMOGRAM  06/15/2025   DTaP/Tdap/Td (3 - Tdap) 01/18/2026   Colonoscopy  12/12/2027   Pneumococcal Vaccine: 50+ Years  Completed   Hepatitis C Screening  Completed   Zoster Vaccines- Shingrix   Completed   Hepatitis B Vaccines  Aged Out   HPV  VACCINES  Aged Out   Meningococcal B Vaccine  Aged Out   Immunization History  Administered Date(s) Administered   Fluad Quad(high Dose 65+) 09/03/2019, 09/17/2020, 09/20/2022   Fluad Trivalent(High Dose 65+) 10/01/2023   Influenza Split 11/29/2011, 10/23/2012   Influenza Whole 09/30/2008, 11/10/2010   Influenza, High Dose Seasonal PF 09/15/2017, 09/14/2018, 09/15/2021, 10/03/2021, 02/08/2022   Influenza,inj,Quad PF,6+ Mos 10/01/2013, 09/13/2014, 10/03/2015, 10/30/2016   PFIZER Comirnaty(Gray Top)Covid-19 Tri-Sucrose Vaccine 10/01/2022   PFIZER(Purple Top)SARS-COV-2 Vaccination 02/07/2020, 03/03/2020, 11/02/2020, 10/03/2021   Pfizer Covid-19 Vaccine Bivalent Booster 56yrs & up 10/31/2021   Pneumococcal Conjugate-13 10/22/2013   Pneumococcal Polysaccharide-23 03/16/2015, 10/03/2021, 02/08/2022   Respiratory Syncytial Virus Vaccine,Recomb Aduvanted(Arexvy) 11/08/2022   Td 12/30/2005, 01/19/2016   Zoster Recombinant(Shingrix ) 01/03/2020, 05/04/2020   Zoster, Live 08/02/2011   We updated and reviewed the patient's past history in detail and it is documented below. Allergies: Patient is allergic to simvastatin  and sulfonamide derivatives. Past Medical History Patient  has a past medical history of Allergy, Anxiety and depression (02/22/2014), Arthritis, Cancer (HCC) (10/2022), Depression, Diastolic dysfunction, Elevated cholesterol, Grover's disease (11/12/2019), History of colonic polyps, Hypertension, Hypothyroidism, Mitral regurgitation, Observed sleep apnea (12/03/2021), OSA (obstructive sleep apnea), Osteoarthritis (06/29/2007), Osteoporosis, post-menopausal (11/12/2019), Recurrent cold sores (11/12/2019), Seasonal allergies, Sleep apnea, and Tinnitus. Past Surgical History Patient  has a past surgical history that includes Total knee arthroplasty (Right, 04/22/2007); Polypectomy; Total knee arthroplasty (11/30/2012); Replacement total knee (Right); Colonoscopy; and Robot assisted  laparoscopic nephrectomy (Right, 12/31/2022). Family History: Patient family history includes Arthritis in her father and another family member; Brain cancer in her father; Breast cancer (age of onset: 25 - 17) in her maternal aunt and paternal aunt; Cancer in her father; Diabetes in her brother, father, and paternal grandfather; Heart attack in her father; Heart disease in her father; Hyperlipidemia in her father; Intellectual disability in her brother; Macular degeneration in her mother; Migraines in her daughter; Pulmonary embolism in her daughter. Social History:  Patient  reports that she has never smoked. She has never been exposed to tobacco smoke. She has never used smokeless tobacco. She reports that she does not currently use alcohol. She reports that she does not use drugs.  Review of Systems: Constitutional: negative for fever or malaise Ophthalmic: negative for photophobia, double vision or loss of  vision Cardiovascular: negative for chest pain, dyspnea on exertion, or new LE swelling Respiratory: negative for SOB or persistent cough Gastrointestinal: negative for abdominal pain, change in bowel habits or melena Genitourinary: negative for dysuria or gross hematuria, no abnormal uterine bleeding or disharge Musculoskeletal: negative for new gait disturbance or muscular weakness Integumentary: negative for new or persistent rashes, no breast lumps Neurological: negative for TIA or stroke symptoms Psychiatric: negative for SI or delusions Allergic/Immunologic: negative for hives  Patient Care Team    Relationship Specialty Notifications Start End  Jodie Lavern CROME, MD PCP - General Family Medicine  11/12/19   Aneita Gwendlyn DASEN, MD (Inactive) Consulting Physician Gastroenterology  03/15/15   Lynnell Nottingham, MD Consulting Physician Dermatology  01/11/20   Kenn Dunnings, OD Consulting Physician Optometry  01/11/20   Frutoso Luz, MD Referring Physician Allergy  11/13/21   Neda Jennet LABOR, MD Consulting Physician Pulmonary Disease  12/03/21   Gaston Hamilton, MD Consulting Physician Urology  12/05/22   Aneita Gwendlyn DASEN, MD (Inactive) Consulting Physician Gastroenterology  12/11/22   Devere Lonni Righter, MD Consulting Physician Urology  12/16/22   Barbaraann Darryle Ned, MD Consulting Physician Cardiology  02/24/23     Objective  Vitals: BP 134/70   Pulse 70   Temp 98.1 F (36.7 C)   Ht 5' 3 (1.6 m)   Wt 136 lb (61.7 kg)   SpO2 96%   BMI 24.09 kg/m  General:  Well developed, well nourished, no acute distress , appears well Psych:  Alert and orientedx3,normal mood and affect HEENT:  Normocephalic, atraumatic, non-icteric sclera,  supple neck without adenopathy, mass or thyromegaly Cardiovascular:  Normal S1, S2, RRR without gallop, rub or murmur Respiratory:  Good breath sounds bilaterally, CTAB with normal respiratory effort Gastrointestinal: normal bowel sounds, soft, non-tender, no noted masses. No HSM MSK: extremities without edema, joints without erythema or swelling Neurologic:    Mental status is normal.  Gross motor and sensory exams are normal.  No tremor  Commons side effects, risks, benefits, and alternatives for medications and treatment plan prescribed today were discussed, and the patient expressed understanding of the given instructions. Patient is instructed to call or message via MyChart if he/she has any questions or concerns regarding our treatment plan. No barriers to understanding were identified. We discussed Red Flag symptoms and signs in detail. Patient expressed understanding regarding what to do in case of urgent or emergency type symptoms.  Medication list was reconciled, printed and provided to the patient in AVS. Patient instructions and summary information was reviewed with the patient as documented in the AVS. This note was prepared with assistance of Dragon voice recognition software. Occasional wrong-word or sound-a-like  substitutions may have occurred due to the inherent limitations of voice recognition software

## 2024-07-15 NOTE — Telephone Encounter (Signed)
 Pharmacy Patient Advocate Encounter   Received notification from CoverMyMeds that prior authorization for diazePAM  5MG  tablets is required/requested.   Insurance verification completed.   The patient is insured through [facility] .   Per test claim: PA required; PA started via CoverMyMeds. KEY [id number] . Waiting for clinical questions to populate.

## 2024-07-16 NOTE — Telephone Encounter (Signed)
 PLEASE BE ADVISED Clinical questions have been answered and PA submitted.TO PLAN. PA currently Pending.

## 2024-07-16 NOTE — Telephone Encounter (Signed)
 Pharmacy Patient Advocate Encounter  Received notification from [facility] that Prior Authorization for diazePAM  5MG  tablets has been APPROVED from 12/31/2023 to 11/13/2024   PA Case ID #: [id number]

## 2024-07-19 ENCOUNTER — Ambulatory Visit: Payer: Self-pay | Admitting: Family Medicine

## 2024-07-19 NOTE — Progress Notes (Signed)
 See mychart note Dear Ms. Hausler, Your lab work results look good overall. However, your sugar tests are higher than they have been; they remain in the prediabetic range. Keep working on eating a healthy diet and I recommend monitoring this number every 6 months. Your blood counts show a very mild anemia; your kidney function is stable but remains a little low.  Sincerely, Dr. Jodie

## 2024-07-27 DIAGNOSIS — J3081 Allergic rhinitis due to animal (cat) (dog) hair and dander: Secondary | ICD-10-CM | POA: Diagnosis not present

## 2024-07-27 DIAGNOSIS — J301 Allergic rhinitis due to pollen: Secondary | ICD-10-CM | POA: Diagnosis not present

## 2024-07-27 DIAGNOSIS — J3089 Other allergic rhinitis: Secondary | ICD-10-CM | POA: Diagnosis not present

## 2024-07-29 NOTE — Telephone Encounter (Signed)
 Pt states since last physical and lab results, she is still not feeling well. Please advise.

## 2024-08-04 ENCOUNTER — Telehealth: Payer: Self-pay | Admitting: Family Medicine

## 2024-08-04 DIAGNOSIS — J301 Allergic rhinitis due to pollen: Secondary | ICD-10-CM | POA: Diagnosis not present

## 2024-08-04 DIAGNOSIS — J3089 Other allergic rhinitis: Secondary | ICD-10-CM | POA: Diagnosis not present

## 2024-08-04 DIAGNOSIS — J3081 Allergic rhinitis due to animal (cat) (dog) hair and dander: Secondary | ICD-10-CM | POA: Diagnosis not present

## 2024-08-04 NOTE — Telephone Encounter (Unsigned)
 Copied from CRM #[id number]. Topic: Clinical - Medication Question >> Aug 04, 2024  3:43 PM Mia F wrote: Reason for CRM: Pt says she was bumped up to 40MG  of the DULoxetine  (CYMBALTA ) 20 MG capsule. She would like to go back down to 30MG . She says she think she was feeling better on the 30MG .

## 2024-08-05 ENCOUNTER — Other Ambulatory Visit: Payer: Self-pay

## 2024-08-05 ENCOUNTER — Telehealth: Payer: Self-pay | Admitting: *Deleted

## 2024-08-05 DIAGNOSIS — F32A Depression, unspecified: Secondary | ICD-10-CM

## 2024-08-05 MED ORDER — DULOXETINE HCL 30 MG PO CPEP: 30.0000 mg | ORAL_CAPSULE | Freq: Every day | ORAL | 3 refills | Status: DC

## 2024-08-05 NOTE — Telephone Encounter (Signed)
 Copied from CRM [id number]. Topic: Clinical - Medical Advice >> Aug 04, 2024  4:29 PM Harlene ORN wrote: Reason for CRM: Patient called to let her PCP know that she will be getting scheduled soon at the [facility] to get her medications.   FYI  Duston Smolenski,RMA

## 2024-08-10 DIAGNOSIS — J3089 Other allergic rhinitis: Secondary | ICD-10-CM | POA: Diagnosis not present

## 2024-08-10 DIAGNOSIS — J301 Allergic rhinitis due to pollen: Secondary | ICD-10-CM | POA: Diagnosis not present

## 2024-08-10 DIAGNOSIS — J3081 Allergic rhinitis due to animal (cat) (dog) hair and dander: Secondary | ICD-10-CM | POA: Diagnosis not present

## 2024-08-11 ENCOUNTER — Ambulatory Visit (INDEPENDENT_AMBULATORY_CARE_PROVIDER_SITE_OTHER)
Admission: RE | Admit: 2024-08-11 | Discharge: 2024-08-11 | Disposition: A | Discharge status: Home / Self Care | Source: Ambulatory Visit | Attending: Family Medicine | Admitting: Family Medicine

## 2024-08-11 DIAGNOSIS — M810 Age-related osteoporosis without current pathological fracture: Secondary | ICD-10-CM | POA: Diagnosis not present

## 2024-08-11 DIAGNOSIS — Z780 Asymptomatic menopausal state: Secondary | ICD-10-CM | POA: Diagnosis not present

## 2024-08-16 ENCOUNTER — Encounter: Payer: Self-pay | Admitting: Adult Health

## 2024-08-16 ENCOUNTER — Ambulatory Visit (INDEPENDENT_AMBULATORY_CARE_PROVIDER_SITE_OTHER): Admitting: Adult Health

## 2024-08-16 VITALS — BP 143/74 | HR 67 | Ht 62.0 in | Wt 136.0 lb

## 2024-08-16 DIAGNOSIS — F411 Generalized anxiety disorder: Secondary | ICD-10-CM | POA: Diagnosis not present

## 2024-08-16 DIAGNOSIS — F331 Major depressive disorder, recurrent, moderate: Secondary | ICD-10-CM

## 2024-08-16 MED ORDER — SERTRALINE HCL 50 MG PO TABS: 50.0000 mg | ORAL_TABLET | Freq: Every day | ORAL | 2 refills | Status: DC

## 2024-08-16 NOTE — Progress Notes (Signed)
 [facility] MD/PA/NP Initial Note  08/16/2024 3:43 PM Morgan [name]  MRN:  [id number]  Chief Complaint:   HPI:   Patient seen today for initial psychiatric evaluation.   Followed by PCP - Dr. Jodie.  Describes mood today as not the best. Stating I feel mentally down. Mood symptoms - reports depression and anxiety. Reports waking up every morning and feeling tired and weak. Reports difficulties getting through the day. Reports having to push herself. Stating I wake up and feel depressed. Reports decreased interest and motivation. Reports recovering from kidney cancer. Reports she has a brother in memory care - dementia. Reports the state of the country is concerning - a lot of things are concerning to me. Stating I feel kinda foggy headed. Denies irritability. Denies panic attacks. Reports worry, rumination and over thinking - how I'm feeling. Reports having less contact with people. Denies obsessive thoughts or acts. Reports mood as lower. Taking medications as prescribed, but feels she is still struggling with mood symptoms. She is willing to consider other options.  Energy levels lower. Active, does not have a regular exercise routine.  Enjoys some usual interests and activities. Widowed. Lives alone with dog and cat. She has no family local. Daughter lives in [place] . Appetite decreased. Weight loss 12 to 15 pounds over the past year - not wanting to eat Sleeps well most nights. Averages 8 to 9 hours. Reports occasional daytime napping. Focus and concentration not as good as it used to be. Completing tasks. Managing aspects of household. Retired - 2012. Denies SI or HI.  Denies AH or VH. Denies self harm. Denies substance use.  Previous medication trials:   Cymbalta  Trintellix  Seroquel  Valium  Trazadone Belsomra Wellbutrin  XL  Visit Diagnosis:    ICD-10-CM   1. Major depressive disorder, recurrent episode, moderate (HCC)  F33.1     2. Generalized  anxiety disorder  F41.1       Past Psychiatric History: Denies psychiatric hospitalization.   Past Medical History:  Past Medical History:  Diagnosis Date   Allergy    Anxiety and depression 02/22/2014   Arthritis    Cancer (HCC) 10/2022   right kidney   Depression    Diastolic dysfunction    Negative Nuclear Stress Test 08/09/12.   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 Neda pulm   OSA (obstructive sleep apnea)    Mild. No CPAP.   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   Seasonal allergies    Sleep apnea    mild not on CPAP   Tinnitus     Past Surgical History:  Procedure Laterality Date   COLONOSCOPY     POLYPECTOMY     Colon   REPLACEMENT TOTAL KNEE Right    ROBOT ASSISTED LAPAROSCOPIC NEPHRECTOMY Right 12/31/2022   Procedure: XI ROBOTIC ASSISTED LAPAROSCOPIC NEPHRECTOMY;  Surgeon: Devere Lonni Righter, MD;  Location: [facility];  Service: Urology;  Laterality: Right;  3 HRS   TOTAL KNEE ARTHROPLASTY Right 04/22/2007   TOTAL KNEE ARTHROPLASTY  11/30/2012   Procedure: TOTAL KNEE ARTHROPLASTY;  Surgeon: Dempsey LULLA Moan, MD;  Location: [facility];  Service: Orthopedics;  Laterality: Left;    Family Psychiatric History: Reports family history of mental illness - depression.   Family History:  Family History  Problem Relation Age of Onset   Macular degeneration Mother    Heart attack Father    Hyperlipidemia Father    Heart disease Father    Diabetes Father    Brain cancer Father    Arthritis Father    Cancer Father    Migraines Daughter    Pulmonary embolism Daughter    Breast cancer Maternal Aunt 38 - 89   Breast cancer Paternal Aunt 109 - 89   Diabetes Paternal Grandfather    Diabetes Brother    Intellectual disability Brother     Arthritis Other    Colon polyps Neg Hx    Colon cancer Neg Hx    Esophageal cancer Neg Hx    Rectal cancer Neg Hx    Stomach cancer Neg Hx    Crohn's disease Neg Hx    Ulcerative colitis Neg Hx     Social History:  Social History   Socioeconomic History   Marital status: Widowed    Spouse name: Not on file   Number of children: 1   Years of education: Not on file   Highest education level: Bachelor's degree (e.g., BA, AB, BS)  Occupational History    Comment: retired    Occupation: Retired Photographer  Tobacco Use   Smoking status: Never    Passive exposure: Never   Smokeless tobacco: Never  Vaping Use   Vaping status: Never Used  Substance and Sexual Activity   Alcohol use: Not Currently    Comment: rare   Drug use: No   Sexual activity: Not Currently    Comment: lives with husband, 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 No major dietary restrictions  Other Topics Concern   Not on file  Social History Narrative   Right handed   Caffeine use: 1/2 caffeine in the morning, glass of tea with meal once per day   Lives alone   Social Drivers of Health   Financial Resource Strain: Low Risk  (07/11/2024)   Overall Financial Resource Strain (CARDIA)    Difficulty of Paying Living Expenses: Not hard at all  Food Insecurity: No Food Insecurity (07/11/2024)   Hunger Vital Sign    Worried About Running Out of Food in the Last Year: Never true    Ran Out of Food in the Last Year: Never true  Transportation Needs: No Transportation Needs (07/11/2024)   PRAPARE - Administrator, Civil Service (Medical): No    Lack of Transportation (Non-Medical): No  Physical Activity: Insufficiently Active (07/11/2024)   Exercise Vital Sign    Days of Exercise per Week: 7 days    Minutes of Exercise per Session: 20 min  Stress: No Stress Concern Present (07/11/2024)   [employer] of Occupational Health - Occupational Stress Questionnaire    Feeling of Stress: Only a little  Social  Connections: Socially Isolated (07/11/2024)   Social Connection and Isolation Panel    Frequency of Communication with Friends and Family: Once a week    Frequency of Social Gatherings with Friends and Family: Once a week    Attends Religious Services: Never    Database administrator or Organizations: No    Attends Engineer, structural: Not on file    Marital Status: Widowed    Allergies:  Allergies  Allergen Reactions   Simvastatin  Other (See Comments)    Myalgia   Sulfonamide Derivatives Rash    Metabolic Disorder Labs: Lab Results  Component Value Date   HGBA1C 6.2 07/15/2024  MPG 111 05/19/2020   MPG 123 (H) 01/19/2016   No results found for: PROLACTIN Lab Results  Component Value Date   CHOL 187 07/15/2024   TRIG 60.0 07/15/2024   HDL 69.00 07/15/2024   CHOLHDL 3 07/15/2024   VLDL 12.0 07/15/2024   LDLCALC 106 (H) 07/15/2024   LDLCALC 122 (H) 07/11/2023   Lab Results  Component Value Date   TSH 0.58 07/15/2024   TSH 1.480 01/30/2024    Therapeutic Level Labs: No results found for: LITHIUM No results found for: VALPROATE No results found for: CBMZ  Current Medications: Current Outpatient Medications  Medication Sig Dispense Refill   atorvastatin  (LIPITOR) 20 MG tablet TAKE 1 TABLET (20 MG TOTAL) BY MOUTH AT BEDTIME. TAKE 1 TABLET BY MOUTH EVERY DAY 90 tablet 3   azelastine  (ASTELIN ) 0.1 % nasal spray Place 2 sprays into both nostrils daily as needed for allergies.     diazepam  (VALIUM ) 5 MG tablet Take 0.5-1 tablets (2.5-5 mg total) by mouth at bedtime as needed for sedation. 30 tablet 1   DULoxetine  (CYMBALTA ) 30 MG capsule Take 1 capsule (30 mg total) by mouth daily. 90 capsule 3   EPINEPHrine 0.3 mg/0.3 mL IJ SOAJ injection Inject 0.3 mg into the muscle as needed for anaphylaxis. On hand     levothyroxine  (SYNTHROID ) 50 MCG tablet TAKE 1 TABLET (50 MCG TOTAL) BY MOUTH DAILY BEFORE BREAKFAST. EVERY TUESDAY AND THURSDAY 30 tablet 11    levothyroxine  (SYNTHROID ) 75 MCG tablet TAKE 1 TABLET BY MOUTH EVERY DAY 90 tablet 3   valACYclovir  (VALTREX ) 1000 MG tablet Take 2 tablets (2,000 mg total) by mouth 2 (two) times daily as needed. Take for cold sores prn 20 tablet 2   No current facility-administered medications for this visit.    Medication Side Effects: none  Orders placed this visit:  No orders of the defined types were placed in this encounter.   Psychiatric Specialty Exam:  Review of Systems  Musculoskeletal:  Negative for gait problem.  Neurological:  Negative for tremors.  Psychiatric/Behavioral:         Please refer to HPI    Blood pressure (!) 143/74, pulse 67, height 5' 2 (1.575 m), weight 136 lb (61.7 kg).Body mass index is 24.87 kg/m.  General Appearance: Casual and Neat  Eye Contact:  Good  Speech:  Clear and Coherent and Normal Rate  Volume:  Normal  Mood:  Anxious and Depressed  Affect:  Appropriate  Thought Process:  Coherent and Descriptions of Associations: Intact  Orientation:  Full (Time, Place, and Person)  Thought Content: Logical   Suicidal Thoughts:  No  Homicidal Thoughts:  No  Memory:  WNL  Judgement:  Good  Insight:  Good  Psychomotor Activity:  Normal  Concentration:  Concentration: Good  Recall:  Good  Fund of Knowledge: Good  Language: Good  Assets:  Communication Skills Desire for Improvement Financial Resources/Insurance Housing Intimacy Leisure Time Physical Health Resilience Social Support Talents/Skills Transportation Vocational/Educational  ADL's:  Intact  Cognition: WNL  Prognosis:  Good   Screenings:  GAD-7    Loss adjuster, chartered Office Visit from 07/15/2024 in [facility] [employer] from 10/01/2023 in [facility] [employer] from 08/13/2023 in [facility] [employer] from 07/11/2023 in [facility] [employer] from 02/07/2023 in [facility] [employer]  GAD-7 Score 0 0 0 1 7   Mini-Mental    Flowsheet Row Office Visit from 01/30/2024 in [facility]  Total Score (max 30 points ) 30   PHQ2-9    Flowsheet Row Office Visit from 07/15/2024 in [facility] [employer] Visit from 10/01/2023 in [facility] [employer] Visit from 08/13/2023 in [facility] [employer] Visit from 07/11/2023 in [facility] Clinical Support from 05/19/2023 in [facility]  PHQ-2 Total Score 0 3 2 3  0  PHQ-9 Total Score -- 6 3 7  --   Flowsheet Row Admission (Discharged) from 12/31/2022 in [facility] Pre-Admission Testing 60 from 12/20/2022 in [facility]  C-SSRS RISK CATEGORY No Risk Error: Question 6 not populated    Receiving Psychotherapy: No   Treatment Plan/Recommendations:   Plan:  PDMP reviewed  Cymbalta  30mg  to 20mg  daily - will plan to d/c Add Zoloft  50mg  daily - 1/2 tablet daily x 5 days then increase to one tablet daily for mood symptoms.  60 minutes spent dedicated to the care of this patient on the date of this encounter to include pre-visit review of records, ordering of medication, post visit documentation, and face-to-face time with the patient discussing depression and anxiety. Discussed continuing current medication regimen.  RTC 4 weeks  Patient advised to contact office with any questions, adverse effects, or acute worsening in signs and symptoms.   Ahniya Mitchum N Yuka Lallier, NP

## 2024-08-17 DIAGNOSIS — J3081 Allergic rhinitis due to animal (cat) (dog) hair and dander: Secondary | ICD-10-CM | POA: Diagnosis not present

## 2024-08-17 DIAGNOSIS — J3089 Other allergic rhinitis: Secondary | ICD-10-CM | POA: Diagnosis not present

## 2024-08-17 DIAGNOSIS — J301 Allergic rhinitis due to pollen: Secondary | ICD-10-CM | POA: Diagnosis not present

## 2024-08-24 DIAGNOSIS — J3081 Allergic rhinitis due to animal (cat) (dog) hair and dander: Secondary | ICD-10-CM | POA: Diagnosis not present

## 2024-08-24 DIAGNOSIS — J301 Allergic rhinitis due to pollen: Secondary | ICD-10-CM | POA: Diagnosis not present

## 2024-08-24 DIAGNOSIS — J3089 Other allergic rhinitis: Secondary | ICD-10-CM | POA: Diagnosis not present

## 2024-08-31 NOTE — Progress Notes (Signed)
 See mychart note Dear Morgan [name], Your bone density test results show worsening bone density. I believe we need to discuss starting medications for this.  Please schedule a visit when you would like to discuss. Sincerely, Dr. Jodie

## 2024-09-01 ENCOUNTER — Ambulatory Visit (INDEPENDENT_AMBULATORY_CARE_PROVIDER_SITE_OTHER): Admitting: Family Medicine

## 2024-09-01 ENCOUNTER — Telehealth: Payer: Self-pay

## 2024-09-01 ENCOUNTER — Encounter: Payer: Self-pay | Admitting: Family Medicine

## 2024-09-01 VITALS — BP 149/70 | HR 56 | Temp 98.2°F | Ht 62.0 in | Wt 138.8 lb

## 2024-09-01 DIAGNOSIS — M810 Age-related osteoporosis without current pathological fracture: Secondary | ICD-10-CM

## 2024-09-01 DIAGNOSIS — J3081 Allergic rhinitis due to animal (cat) (dog) hair and dander: Secondary | ICD-10-CM | POA: Insufficient documentation

## 2024-09-01 DIAGNOSIS — J301 Allergic rhinitis due to pollen: Secondary | ICD-10-CM | POA: Insufficient documentation

## 2024-09-01 DIAGNOSIS — F339 Major depressive disorder, recurrent, unspecified: Secondary | ICD-10-CM

## 2024-09-01 DIAGNOSIS — J3089 Other allergic rhinitis: Secondary | ICD-10-CM | POA: Diagnosis not present

## 2024-09-01 MED ORDER — DENOSUMAB 60 MG/ML ~~LOC~~ SOSY: 60.0000 mg | PREFILLED_SYRINGE | Freq: Once | SUBCUTANEOUS | Status: AC

## 2024-09-01 NOTE — Progress Notes (Signed)
 Subjective  CC:  Chief Complaint  Patient presents with   lab results    Pt here to F/U on DEXA    HPI: Morgan [name] is a 73 y.o. female who presents to the office today to address the problems listed above in the chief complaint. Discussed the use of AI scribe software for clinical note transcription with the patient, who gave verbal consent to proceed.  History of Present Illness Morgan [name] is a 73 year old female who presents for follow-up on bone density results.  She has been active, exercising regularly, and taking calcium  and vitamin D  supplements. She is concerned about the increased risk of fractures.  Osteoporosis: Bone density shows worsening bone density with a T-score lowest -2.6.  She does have history of GERD and is on omeprazole.  Has tried Fosamax  in the past with GI symptoms.  Also history of 1 kidney, recommend Prolia  as this is the safest medication for her.    Dexa 11/2019: T = - 2.6 right femur, worse than prior dexa 2017; recommend starting treatment. Pt deferred.  Dexa 04/2022: lowest T - -2.3, improved. Recheck 2 years Dexa 07/2024: lowest T =-2.5 at l-spine and RFN: declining, rec treatment. Rec ov to discuss.2025: rec prolia   Follow-up chronic depression: Now seeing psychiatry.  I reviewed her records.  Weaned off of Cymbalta  and started sertraline .  She is currently taking a generic form of sertraline  at a dose of 50 mg daily, which she started at a half dose for a week before increasing to the full dose ten days ago. She feels 'foggy' and 'unsteady' on her feet, which concerns her due to her osteoporosis. She has been taking the medication at night but experienced difficulty returning to sleep after waking. She plans to continue the medication until her next appointment on September 22nd.  She takes vitamins daily, including a Centrum Silver for women and additional vitamin D  with calcium  every other day. Her calcium  levels were normal in  recent labs. She also mentioned her red blood cell count was low, and she has been taking iron supplements occasionally.       Assessment  1. Osteoporosis, post-menopausal   2. Major depression, recurrent, chronic (HCC)      Plan  Assessment and Plan Assessment & Plan Osteoporosis Worsening bone density with increased fracture risk. Denosumab  (Prolia ) considered safe and effective given her single kidney, reducing bone resorption and fracture risk. - Order Denosumab  (Prolia ) injection.  Counseling and education given.  This would be a good choice for her. - Continue calcium  and vitamin D  supplementation. - Maintain regular exercise regimen.  Major depressive disorder Experiencing side effects from sertraline , including fogginess and unsteadiness. Advised monitoring symptoms and potential adjustment to nighttime dosing. - Continue sertraline  (Zoloft ) and monitor side effects. - Consider using a cane for stability. - Follow up with psychiatrist on September 22nd.     Follow up: As needed No orders of the defined types were placed in this encounter.  Meds ordered this encounter  Medications   denosumab  (PROLIA ) injection 60 mg    Patient is enrolled in REMS program for this medication and I have provided a copy of the Prolia  Medication Guide and Patient Brochure.:   Yes    I have reviewed with the patient the information in the Prolia  Medication Guide and Patient Counseling Chart including the serious risks of Prolia  and symptoms of each risk.:   No    I have advised the patient  to seek medical attention if they have signs or symptoms of any of the serious risks.:   No     I reviewed the patients updated PMH, FH, and SocHx.  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     Priority: High   S/p nephrectomy 02/07/2023    Priority: High   Mixed hyperlipidemia 07/14/2007    Priority: High   Acquired hypothyroidism 06/29/2007    Priority: High   Obstructive sleep apnea 12/03/2021    Priority: Medium    Major depression, recurrent, chronic (HCC) 09/25/2020    Priority: Medium    IFG (impaired fasting glucose) 05/19/2020    Priority: Medium    Osteoporosis, post-menopausal 11/12/2019    Priority: Medium    Colon polyps 11/12/2019    Priority: Medium    GAD (generalized anxiety disorder) 02/22/2014    Priority: Medium    Diastolic dysfunction 06/26/2010    Priority: Medium    Osteoarthritis 06/29/2007    Priority: Medium    Essential tremor 04/30/2021    Priority: Low   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 TAKE 1 TABLET BY MOUTH EVERY DAY   azelastine  (ASTELIN ) 0.1 % nasal spray Place 2 sprays into both nostrils daily as needed for allergies.   diazepam  (VALIUM ) 5 MG tablet Take 0.5-1 tablets (2.5-5 mg total) by mouth at bedtime as needed for sedation.   EPINEPHrine 0.3 mg/0.3 mL IJ SOAJ injection Inject 0.3 mg into the muscle as needed for anaphylaxis. On hand   levothyroxine  (SYNTHROID ) 50 MCG tablet TAKE 1 TABLET (50 MCG TOTAL) BY MOUTH DAILY BEFORE BREAKFAST. EVERY TUESDAY AND THURSDAY   levothyroxine  (SYNTHROID ) 75 MCG tablet TAKE 1 TABLET BY MOUTH EVERY DAY   sertraline  (ZOLOFT ) 50 MG tablet Take 1 tablet (50 mg total) by mouth daily.   valACYclovir  (VALTREX ) 1000 MG tablet Take 2 tablets (2,000 mg total) by mouth 2 (two) times daily  as needed. Take for cold sores prn   Current Facility-Administered Medications for the 09/01/24 encounter (Office Visit) with Jodie Lavern CROME, MD  Medication   [START ON 09/15/2024] denosumab  (PROLIA ) injection 60 mg   Allergies: Patient is allergic to simvastatin  and sulfonamide derivatives. Family History: Patient family history includes Arthritis in her father and another family member; Brain cancer in her father; Breast cancer (age of onset: 73 - 37) in her maternal aunt and paternal aunt; Cancer in her father; Diabetes in her brother, father, and paternal grandfather; Heart attack in her father; Heart disease in her father; Hyperlipidemia in her father; Intellectual disability in her brother; Macular degeneration in her mother; Migraines in her daughter; Pulmonary embolism in her daughter. Social History:  Patient  reports that she has never smoked. She has never been exposed to tobacco smoke. She has never used smokeless tobacco. She reports  that she does not currently use alcohol. She reports that she does not use drugs.  Review of Systems: Constitutional: Negative for fever malaise or anorexia Cardiovascular: negative for chest pain Respiratory: negative for SOB or persistent cough Gastrointestinal: negative for abdominal pain  Objective  Vitals: BP (!) 149/70   Pulse (!) 56   Temp 98.2 F (36.8 C)   Ht 5' 2 (1.575 m)   Wt 138 lb 12.8 oz (63 kg)   SpO2 99%   BMI 25.39 kg/m  General: no acute distress , A&Ox3 Mood is brighter today  Commons side effects, risks, benefits, and alternatives for medications and treatment plan prescribed today were discussed, and the patient expressed understanding of the given instructions. Patient is instructed to call or message via MyChart if he/she has any questions or concerns regarding our treatment plan. No barriers to understanding were identified. We discussed Red Flag symptoms and signs in detail. Patient expressed understanding regarding  what to do in case of urgent or emergency type symptoms.  Medication list was reconciled, printed and provided to the patient in AVS. Patient instructions and summary information was reviewed with the patient as documented in the AVS. This note was prepared with assistance of Dragon voice recognition software. Occasional wrong-word or sound-a-like substitutions may have occurred due to the inherent limitations of voice recognition software

## 2024-09-01 NOTE — Telephone Encounter (Signed)
 Prolia  VOB initiated via [email or web address]  Next Prolia  inj DUE: NEW START

## 2024-09-02 ENCOUNTER — Other Ambulatory Visit (HOSPITAL_COMMUNITY): Payer: Self-pay

## 2024-09-02 ENCOUNTER — Telehealth: Payer: Self-pay | Admitting: Adult Health

## 2024-09-02 NOTE — Telephone Encounter (Signed)
 Next visit is 09/20/24. Morgan [name] called stating that she was started on Sertraline  and has been on it for 10 days. She feels foggy and unbalanced on feet. Could she get a phone call back at [phone].

## 2024-09-02 NOTE — Telephone Encounter (Signed)
 Pt was seen 8/18 for initial visit:  Cymbalta  30mg  to 20mg  daily - will plan to d/c Add Zoloft  50mg  daily - 1/2 tablet daily x 5 days then increase to one tablet daily for mood symptoms.  She reports she has been on the whole tablet of Zoloft  for 11 days now. She is reporting being foggy headed and unsteady on her feet. I told her that sometimes med changes can make you feel worse before you feel better, and that the Sertraline  hasn't had time to get good benefit. Cautioned her about not standing up until she is sturdy and to not drive if she didn't feel safe. She reports she lives alone and has to drive sometimes. She has FU 9/22. Asking if she needs to give medication more time or is there another adjustment that can be made.

## 2024-09-03 ENCOUNTER — Other Ambulatory Visit (HOSPITAL_COMMUNITY): Payer: Self-pay

## 2024-09-03 NOTE — Telephone Encounter (Signed)
 Pt advised of recommendations to give medication changes more time.

## 2024-09-03 NOTE — Telephone Encounter (Signed)
 SABRA

## 2024-09-03 NOTE — Telephone Encounter (Addendum)
 MEDICAL PA submitted via [employer]. Authorization Number : [id number]   APPROVED    PHARMACY BENEFIT: $645.79

## 2024-09-06 NOTE — Telephone Encounter (Signed)
 Pt ready for scheduling for PROLIA  on or after : 09/06/24  Option# 1: Buy/Bill (Office supplied medication)  Out-of-pocket cost due at time of clinic visit: $362  Number of injection/visits approved: 2  Primary: [employer]IC[employer] Prolia  co-insurance: 20% Admin fee co-insurance: $30  Secondary: --- Prolia  co-insurance:  Admin fee co-insurance:   Medical Benefit Details: Date Benefits were checked: 09/03/24 Deductible: NO/ Coinsurance: 20%/ Admin Fee: $30  Prior Auth: APPROVED PA# [id number]  Expiration Date: 09/03/24-09/03/25  # of doses approved: 2 ----------------------------------------------------------------------- Option# 2- Med Obtained from pharmacy:  Pharmacy benefit: Copay $645.79 (Paid to pharmacy) Admin Fee: $30 (Pay at clinic)  Prior Auth: N/A PA# Expiration Date:   # of doses approved:   If patient wants fill through the pharmacy benefit please send prescription to: [facility], and include estimated need by date in rx notes. Pharmacy will ship medication directly to the office.  Patient NOT eligible for Prolia  Copay Card. Copay Card can make patient's cost as little as $25. Link to apply: [email or web address]://[email or web address]  ** This summary of benefits is an estimation of the patient's out-of-pocket cost. Exact cost may very based on individual plan coverage.

## 2024-09-07 DIAGNOSIS — J3089 Other allergic rhinitis: Secondary | ICD-10-CM | POA: Diagnosis not present

## 2024-09-07 DIAGNOSIS — J3081 Allergic rhinitis due to animal (cat) (dog) hair and dander: Secondary | ICD-10-CM | POA: Diagnosis not present

## 2024-09-07 DIAGNOSIS — J301 Allergic rhinitis due to pollen: Secondary | ICD-10-CM | POA: Diagnosis not present

## 2024-09-09 DIAGNOSIS — S20212A Contusion of left front wall of thorax, initial encounter: Secondary | ICD-10-CM | POA: Diagnosis not present

## 2024-09-14 DIAGNOSIS — J3081 Allergic rhinitis due to animal (cat) (dog) hair and dander: Secondary | ICD-10-CM | POA: Diagnosis not present

## 2024-09-14 DIAGNOSIS — J301 Allergic rhinitis due to pollen: Secondary | ICD-10-CM | POA: Diagnosis not present

## 2024-09-14 DIAGNOSIS — J3089 Other allergic rhinitis: Secondary | ICD-10-CM | POA: Diagnosis not present

## 2024-09-15 ENCOUNTER — Telehealth: Payer: Self-pay

## 2024-09-15 MED ORDER — DESVENLAFAXINE SUCCINATE ER 25 MG PO TB24: 25.0000 mg | ORAL_TABLET | Freq: Every day | ORAL | 0 refills | Status: DC

## 2024-09-15 NOTE — Telephone Encounter (Signed)
 Rx for Pristiq  sent.

## 2024-09-15 NOTE — Telephone Encounter (Signed)
 Pt seen for the first time on 8/18.  Cymbalta  30mg  to 20mg  daily - will plan to d/c Add Zoloft  50mg  daily - 1/2 tablet daily x 5 days then increase to one tablet daily for mood symptoms.  She previously reported not feeling well and was told to give it more time, that it could be a combination of weaning Cymbalta  and starting the Zoloft .  She called today to say she has never felt this bad before. Reports her balance is off, was walking her dog and bent down and fell, broke ribs. She has FU next week. Told her that you probably won't change to a new medication until she is seen, but may have her stop/taper off the sertraline .

## 2024-09-20 ENCOUNTER — Telehealth (INDEPENDENT_AMBULATORY_CARE_PROVIDER_SITE_OTHER): Payer: Self-pay | Admitting: Adult Health

## 2024-09-20 DIAGNOSIS — Z0389 Encounter for observation for other suspected diseases and conditions ruled out: Secondary | ICD-10-CM

## 2024-09-20 NOTE — Progress Notes (Signed)
Patient no show appointment. Called and LVM to return call.  

## 2024-09-21 ENCOUNTER — Telehealth: Payer: Self-pay | Admitting: Adult Health

## 2024-09-21 DIAGNOSIS — J3081 Allergic rhinitis due to animal (cat) (dog) hair and dander: Secondary | ICD-10-CM | POA: Diagnosis not present

## 2024-09-21 DIAGNOSIS — J3089 Other allergic rhinitis: Secondary | ICD-10-CM | POA: Diagnosis not present

## 2024-09-21 DIAGNOSIS — J301 Allergic rhinitis due to pollen: Secondary | ICD-10-CM | POA: Diagnosis not present

## 2024-09-21 NOTE — Telephone Encounter (Signed)
 Pt called and said that gina told her that since they had changed medications  and she didn't need to come yesterday for her appointment . She is suppose to call back after a few weeks and schedule. So please take out the NS fee gina

## 2024-09-21 NOTE — Telephone Encounter (Signed)
 Please see message.

## 2024-09-24 DIAGNOSIS — J301 Allergic rhinitis due to pollen: Secondary | ICD-10-CM | POA: Diagnosis not present

## 2024-09-24 DIAGNOSIS — J3089 Other allergic rhinitis: Secondary | ICD-10-CM | POA: Diagnosis not present

## 2024-09-24 DIAGNOSIS — J3081 Allergic rhinitis due to animal (cat) (dog) hair and dander: Secondary | ICD-10-CM | POA: Diagnosis not present

## 2024-09-28 ENCOUNTER — Telehealth: Payer: Self-pay

## 2024-09-28 NOTE — Telephone Encounter (Signed)
 Spoke with pt to let her know the cost of her Prolia  and any fees that should would be accountable for and she does not want to do it right this minute until she can figure out another plan referring to a cheaper cost and she will get back with us .

## 2024-09-29 ENCOUNTER — Telehealth: Payer: Self-pay

## 2024-09-29 DIAGNOSIS — J3089 Other allergic rhinitis: Secondary | ICD-10-CM | POA: Diagnosis not present

## 2024-09-29 DIAGNOSIS — J3081 Allergic rhinitis due to animal (cat) (dog) hair and dander: Secondary | ICD-10-CM | POA: Diagnosis not present

## 2024-09-29 DIAGNOSIS — J301 Allergic rhinitis due to pollen: Secondary | ICD-10-CM | POA: Diagnosis not present

## 2024-09-29 NOTE — Telephone Encounter (Signed)
 Copied from CRM #[id number]. Topic: General - Other >> Sep 28, 2024  2:20 PM Macario HERO wrote: Reason for CRM: Patient called said she received a call from [facility] regarding Prolia  and she  changed her mind and would like to get started on it.  Please contact pt to get scheduled for Prolia  injections as a NV. Please remind her of any out-of-pocket fees/fees in general that she will be responsible for.  Thank you,  Avelina Situ

## 2024-09-29 NOTE — Telephone Encounter (Signed)
 Copied from CRM #[id number]. Topic: General - Other >> Sep 28, 2024  3:54 PM Berneda F wrote: Reason for CRM: Patient is calling back again and letting the office and PCP know that she no longer wants to take the prolia  after reading the side effects of this. She would like to know what else Dr. Jodie would recommend for her please.  Just got off phone with pt and she stated that she decided not to do Prolia  injection bc of potential side effects and would like to know if there is anything else that you would recommend.  Message has been sent to provider to address.

## 2024-10-04 ENCOUNTER — Telehealth: Payer: Self-pay | Admitting: Adult Health

## 2024-10-04 NOTE — Telephone Encounter (Signed)
 Patient called in for refill on Desvenlafaxine  25mg . Ph: [phone] Pharmacy [facility] [address] [place], [place]

## 2024-10-04 NOTE — Telephone Encounter (Signed)
 Sent!

## 2024-10-05 DIAGNOSIS — J3089 Other allergic rhinitis: Secondary | ICD-10-CM | POA: Diagnosis not present

## 2024-10-05 DIAGNOSIS — J3081 Allergic rhinitis due to animal (cat) (dog) hair and dander: Secondary | ICD-10-CM | POA: Diagnosis not present

## 2024-10-05 DIAGNOSIS — J301 Allergic rhinitis due to pollen: Secondary | ICD-10-CM | POA: Diagnosis not present

## 2024-10-07 NOTE — Telephone Encounter (Signed)
 Please reach out to pt and schedule NV for Prolia .    Option# 1: Buy/Bill (Office supplied medication)   Out-of-pocket cost due at time of clinic visit: $362   Number of injection/visits approved: 2   Primary: AET[employer]-MEDICARE Prolia  co-insurance: 20% Admin fee co-insurance: $30   Secondary: --- Prolia  co-insurance:  Admin fee co-insurance:    Medical Benefit Details: Date Benefits were checked: 09/03/24 Deductible: NO/ Coinsurance: 20%/ Admin Fee: $30

## 2024-10-07 NOTE — Telephone Encounter (Signed)
 Called pt to schedule prolia  injection. Informed of $362 out of pocket cost at time of visit.

## 2024-10-15 DIAGNOSIS — J3089 Other allergic rhinitis: Secondary | ICD-10-CM | POA: Diagnosis not present

## 2024-10-15 DIAGNOSIS — J301 Allergic rhinitis due to pollen: Secondary | ICD-10-CM | POA: Diagnosis not present

## 2024-10-15 DIAGNOSIS — J3081 Allergic rhinitis due to animal (cat) (dog) hair and dander: Secondary | ICD-10-CM | POA: Diagnosis not present

## 2024-10-18 NOTE — Telephone Encounter (Signed)
 Patient is scheduled for Prolia  injection 10/27/24

## 2024-10-20 ENCOUNTER — Telehealth: Payer: Self-pay

## 2024-10-20 NOTE — Telephone Encounter (Signed)
 Copied from CRM [id number]. Topic: Clinical - Medication Question >> Oct 20, 2024 11:24 AM Robinson H wrote: Reason for CRM: Patient has some questions about her upcoming Prolia  injection, wants to know if office has medication already and may want to push the injection back but has some questions for the nurse before she makes a decision.  Alisa [phone]

## 2024-10-21 DIAGNOSIS — J3081 Allergic rhinitis due to animal (cat) (dog) hair and dander: Secondary | ICD-10-CM | POA: Diagnosis not present

## 2024-10-21 DIAGNOSIS — J301 Allergic rhinitis due to pollen: Secondary | ICD-10-CM | POA: Diagnosis not present

## 2024-10-21 DIAGNOSIS — J3089 Other allergic rhinitis: Secondary | ICD-10-CM | POA: Diagnosis not present

## 2024-10-26 ENCOUNTER — Telehealth: Payer: Self-pay | Admitting: Family Medicine

## 2024-10-26 ENCOUNTER — Encounter: Payer: Self-pay | Admitting: Adult Health

## 2024-10-26 ENCOUNTER — Telehealth: Admitting: Adult Health

## 2024-10-26 DIAGNOSIS — F411 Generalized anxiety disorder: Secondary | ICD-10-CM

## 2024-10-26 DIAGNOSIS — F32A Depression, unspecified: Secondary | ICD-10-CM | POA: Diagnosis not present

## 2024-10-26 DIAGNOSIS — F419 Anxiety disorder, unspecified: Secondary | ICD-10-CM

## 2024-10-26 DIAGNOSIS — F331 Major depressive disorder, recurrent, moderate: Secondary | ICD-10-CM

## 2024-10-26 MED ORDER — LAMOTRIGINE 25 MG PO TABS: ORAL_TABLET | ORAL | 2 refills | Status: DC

## 2024-10-26 NOTE — Telephone Encounter (Signed)
 Copied from CRM #[id number]. Topic: Clinical - Medical Advice >> Oct 26, 2024 11:17 AM Nessti S wrote: Reason for CRM: pt called because she wants pcp to prescribe fofamax for osteoporosis. Call back number [phone]

## 2024-10-26 NOTE — Progress Notes (Signed)
 Morgan [name] [id number] 25-Sep-1951 73 y.o.  Virtual Visit via Video Note  I connected with pt @ on 10/26/24 at  4:30 PM EDT by a video enabled telemedicine application and verified that I am speaking with the correct person using two identifiers.   I discussed the limitations of evaluation and management by telemedicine and the availability of in person appointments. The patient expressed understanding and agreed to proceed.  I discussed the assessment and treatment plan with the patient. The patient was provided an opportunity to ask questions and all were answered. The patient agreed with the plan and demonstrated an understanding of the instructions.   The patient was advised to call back or seek an in-person evaluation if the symptoms worsen or if the condition fails to improve as anticipated.  I provided 25 minutes of non-face-to-face time during this encounter.  The patient was located at home.  The provider was located at [facility].   Angeline LOISE Sayers, NP   Subjective:   Patient ID:  Morgan [name] is a 74 y.o. (DOB 01-05-1951) female.  Chief Complaint: No chief complaint on file.   HPI Morgan [name] presents for follow-up of MDD and GAD.  Followed by PCP - Dr. Jodie.  Describes mood today as not any better. Pleasant. Tearful. Mood symptoms - reports more depression overall. Reports decreased interest and motivation - pushing herself. Reports anxiety at times  - [place]. Reports most day to day things don't make her feel anxious. Denies irritability. Denies panic attacks. Reports some worry, rumination and over thinking - [place] going [place] in the country right now. Denies obsessive thoughts or acts. Reports mood as lower. Stating my mood is not where I want it to be. Feels like the past few years have been difficult. Taking medications as prescribed, but feels she is still struggling with mood symptoms. She is  willing to consider other options.  Energy levels lower - feeing tried and weak. Active, does not have a regular exercise routine. She does walk her dog 2 to 3 times a day. Enjoys some usual interests and activities. Widowed. Lives alone with dog and cat. She has no family local. Daughter lives in [place] . Appetite decreased. Weight stable - 138 pounds - 63. Sleeps well most nights. Averages 9 hours. Reports occasional daytime napping. Focus and concentration difficulties - a little bit. Completing tasks. Managing aspects of household. Retired - 2012. Denies SI or HI.  Denies AH or VH. Denies self harm. Denies substance use.  Previous medication trials:   Cymbalta  Trintellix  Seroquel  Valium  Trazadone Belsomra Wellbutrin  XL Pristiq    Review of Systems:  Review of Systems  Musculoskeletal:  Negative for gait problem.  Neurological:  Negative for tremors.  Psychiatric/Behavioral:         Please refer to HPI    Medications: I have reviewed the patient's current medications.  Current Outpatient Medications  Medication Sig Dispense Refill   atorvastatin  (LIPITOR) 20 MG tablet TAKE 1 TABLET (20 MG TOTAL) BY MOUTH AT BEDTIME. TAKE 1 TABLET BY MOUTH EVERY DAY 90 tablet 3   azelastine  (ASTELIN ) 0.1 % nasal spray Place 2 sprays into both nostrils daily as needed for allergies.     desvenlafaxine  (PRISTIQ ) 25 MG 24 hr tablet TAKE 1 TABLET (25 MG TOTAL) BY MOUTH DAILY. STOP SERTRALINE  30 tablet 0   diazepam  (VALIUM ) 5 MG tablet Take 0.5-1 tablets (2.5-5 mg total) by mouth at bedtime as needed for sedation. 30 tablet 1  DULoxetine  (CYMBALTA ) 30 MG capsule Take 1 capsule (30 mg total) by mouth daily. 90 capsule 3   EPINEPHrine 0.3 mg/0.3 mL IJ SOAJ injection Inject 0.3 mg into the muscle as needed for anaphylaxis. On hand     levothyroxine  (SYNTHROID ) 50 MCG tablet TAKE 1 TABLET (50 MCG TOTAL) BY MOUTH DAILY BEFORE BREAKFAST. EVERY TUESDAY AND THURSDAY 30 tablet 11   levothyroxine   (SYNTHROID ) 75 MCG tablet TAKE 1 TABLET BY MOUTH EVERY DAY 90 tablet 3   sertraline  (ZOLOFT ) 50 MG tablet Take 1 tablet (50 mg total) by mouth daily. 30 tablet 2   valACYclovir  (VALTREX ) 1000 MG tablet Take 2 tablets (2,000 mg total) by mouth 2 (two) times daily as needed. Take for cold sores prn 20 tablet 2   Current Facility-Administered Medications  Medication Dose Route Frequency Provider Last Rate Last Admin   denosumab  (PROLIA ) injection 60 mg  60 mg Subcutaneous Once Andy, Camille L, MD        Medication Side Effects: None  Allergies:  Allergies  Allergen Reactions   Simvastatin  Other (See Comments)    Myalgia   Sulfonamide Derivatives Rash    Past Medical History:  Diagnosis Date   Allergy    Anxiety and depression 02/22/2014   Arthritis    Cancer (HCC) 10/2022   right kidney   Depression    Diastolic dysfunction    Negative Nuclear Stress Test 08/09/12.   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 Neda pulm   OSA (obstructive sleep apnea)    Mild. No CPAP.   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   Seasonal allergies    Sleep apnea    mild not on CPAP   Tinnitus     Family History  Problem Relation Age of Onset   Macular degeneration Mother    Heart attack Father    Hyperlipidemia Father    Heart disease Father    Diabetes Father    Brain cancer Father    Arthritis Father    Cancer Father    Migraines Daughter    Pulmonary embolism Daughter    Breast cancer Maternal Aunt 24 - 89   Breast cancer Paternal Aunt 56 - 89   Diabetes Paternal Grandfather    Diabetes Brother    Intellectual disability Brother    Arthritis Other    Colon polyps Neg Hx    Colon cancer Neg Hx    Esophageal cancer Neg Hx    Rectal cancer Neg Hx    Stomach  cancer Neg Hx    Crohn's disease Neg Hx    Ulcerative colitis Neg Hx     Social History   Socioeconomic History   Marital status: Widowed    Spouse name: Not on file   Number of children: 1   Years of education: Not on file   Highest education level: Bachelor's degree (e.g., BA, AB, BS)  Occupational History    Comment: retired    Occupation: Retired Photographer  Tobacco Use   Smoking status: Never    Passive exposure: Never   Smokeless tobacco: Never  Vaping Use   Vaping status: Never Used  Substance and Sexual Activity   Alcohol use: Not Currently    Comment: rare   Drug use:  No   Sexual activity: Not Currently    Comment: lives with husband, 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 No major dietary restrictions  Other Topics Concern   Not on file  Social History Narrative   Right handed   Caffeine use: 1/2 caffeine in the morning, glass of tea with meal once per day   Lives alone   Social Drivers of Health   Financial Resource Strain: Low Risk  (07/11/2024)   Overall Financial Resource Strain (CARDIA)    Difficulty of Paying Living Expenses: Not hard at all  Food Insecurity: No Food Insecurity (07/11/2024)   Hunger Vital Sign    Worried About Running Out of Food in the Last Year: Never true    Ran Out of Food in the Last Year: Never true  Transportation Needs: No Transportation Needs (07/11/2024)   PRAPARE - Administrator, Civil Service (Medical): No    Lack of Transportation (Non-Medical): No  Physical Activity: Insufficiently Active (07/11/2024)   Exercise Vital Sign    Days of Exercise per Week: 7 days    Minutes of Exercise per Session: 20 min  Stress: No Stress Concern Present (07/11/2024)   [employer] of Occupational Health - Occupational Stress Questionnaire    Feeling of Stress: Only a little  Social Connections: Socially Isolated (07/11/2024)   Social Connection and Isolation Panel    Frequency of Communication with Friends and Family: Once a  week    Frequency of Social Gatherings with Friends and Family: Once a week    Attends Religious Services: Never    Database Administrator or Organizations: No    Attends Engineer, Structural: Not on file    Marital Status: Widowed  Intimate Partner Violence: Not At Risk (05/19/2023)   Humiliation, Afraid, Rape, and Kick questionnaire    Fear of Current or Ex-Partner: No    Emotionally Abused: No    Physically Abused: No    Sexually Abused: No    Past Medical History, Surgical history, Social history, and Family history were reviewed and updated as appropriate.   Please see review of systems for further details on the patient's review from today.   Objective:   Physical Exam:  There were no vitals taken for this visit.  Physical Exam Constitutional:      General: She is not in acute distress. Musculoskeletal:        General: No deformity.  Neurological:     Mental Status: She is alert and oriented to person, place, and time.     Coordination: Coordination normal.  Psychiatric:        Attention and Perception: Attention and perception normal. She does not perceive auditory or visual hallucinations.        Mood and Affect: Mood normal. Mood is not anxious or depressed. Affect is not labile, blunt, angry or inappropriate.        Speech: Speech normal.        Behavior: Behavior normal.        Thought Content: Thought content normal. Thought content is not paranoid or delusional. Thought content does not include homicidal or suicidal ideation. Thought content does not include homicidal or suicidal plan.        Cognition and Memory: Cognition and memory normal.        Judgment: Judgment normal.     Comments: Insight intact     Lab Review:     Component Value Date/Time   NA 140 07/15/2024 1118  NA 144 01/30/2024 1143   K 4.5 07/15/2024 1118   CL 104 07/15/2024 1118   CO2 28 07/15/2024 1118   GLUCOSE 110 (H) 07/15/2024 1118   BUN 24 (H) 07/15/2024 1118   BUN 23  01/30/2024 1143   CREATININE 1.31 (H) 07/15/2024 1118   CREATININE 0.80 09/25/2020 1443   CALCIUM  9.6 07/15/2024 1118   PROT 6.4 07/15/2024 1118   PROT 7.3 01/30/2024 1143   ALBUMIN  4.2 07/15/2024 1118   ALBUMIN  4.6 01/30/2024 1143   AST 20 07/15/2024 1118   ALT 13 07/15/2024 1118   ALKPHOS 71 07/15/2024 1118   BILITOT 0.7 07/15/2024 1118   BILITOT 0.5 01/30/2024 1143   GFRNONAA 37 (L) 01/01/2023 0506   GFRNONAA 74 09/25/2020 1443   GFRAA 86 09/25/2020 1443       Component Value Date/Time   WBC 5.7 07/15/2024 1118   RBC 3.78 (L) 07/15/2024 1118   HGB 11.7 (L) 07/15/2024 1118   HGB 12.6 01/30/2024 1143   HCT 35.1 (L) 07/15/2024 1118   HCT 38.6 01/30/2024 1143   PLT 283.0 07/15/2024 1118   PLT 342 01/30/2024 1143   MCV 92.8 07/15/2024 1118   MCV 94 01/30/2024 1143   MCH 30.8 01/30/2024 1143   MCH 31.3 12/20/2022 1430   MCHC 33.3 07/15/2024 1118   RDW 14.1 07/15/2024 1118   RDW 12.9 01/30/2024 1143   LYMPHSABS 2.2 07/15/2024 1118   LYMPHSABS 2.7 01/30/2024 1143   MONOABS 0.3 07/15/2024 1118   EOSABS 0.3 07/15/2024 1118   EOSABS 0.3 01/30/2024 1143   BASOSABS 0.0 07/15/2024 1118   BASOSABS 0.0 01/30/2024 1143    No results found for: POCLITH, LITHIUM   No results found for: PHENYTOIN, PHENOBARB, VALPROATE, CBMZ   .res Assessment: Plan:    Treatment Plan/Recommendations:   Plan:  PDMP reviewed  Add Lamictal 25mg  daily  at bedtime x 2 weeks then 50mg  at hs.  D/C Pristiq  25mg  every morning  Consider Abilify  25 minutes spent dedicated to the care of this patient on the date of this encounter to include pre-visit review of records, ordering of medication, post visit documentation, and face-to-face time with the patient discussing depression and anxiety. Discussed continuing current medication regimen.  RTC 4 weeks  Patient advised to contact office with any questions, adverse effects, or acute worsening in signs and symptoms.  Counseled patient  regarding potential benefits, risks, and side effects of Lamictal to include potential risk of Stevens-Johnson syndrome. Advised patient to stop taking Lamictal and contact office immediately if rash develops and to seek urgent medical attention if rash is severe and/or spreading quickly. Will start Lamictal 25 mg daily for 2 weeks, then increase to 50 mg daily for 2 weeks.    There are no diagnoses linked to this encounter.   Please see After Visit Summary for patient specific instructions.  Future Appointments  Date Time Provider Department Center  10/26/2024  4:30 PM Huel Centola Nattalie, NP [facility] None    No orders of the defined types were placed in this encounter.     -------------------------------

## 2024-10-27 ENCOUNTER — Ambulatory Visit

## 2024-10-28 NOTE — Telephone Encounter (Signed)
 LVM for pt regarding her thoughts on the Prolia  which her and Dr. Jodie has discussed. Waiting on response from pt.

## 2024-10-29 DIAGNOSIS — J3089 Other allergic rhinitis: Secondary | ICD-10-CM | POA: Diagnosis not present

## 2024-10-29 DIAGNOSIS — J301 Allergic rhinitis due to pollen: Secondary | ICD-10-CM | POA: Diagnosis not present

## 2024-10-29 DIAGNOSIS — J3081 Allergic rhinitis due to animal (cat) (dog) hair and dander: Secondary | ICD-10-CM | POA: Diagnosis not present

## 2024-11-04 NOTE — Telephone Encounter (Signed)
 Pt canceled this appt. please reach out and reschedule her for Prolia KIT

## 2024-11-05 DIAGNOSIS — J301 Allergic rhinitis due to pollen: Secondary | ICD-10-CM | POA: Diagnosis not present

## 2024-11-05 DIAGNOSIS — J3089 Other allergic rhinitis: Secondary | ICD-10-CM | POA: Diagnosis not present

## 2024-11-05 DIAGNOSIS — J3081 Allergic rhinitis due to animal (cat) (dog) hair and dander: Secondary | ICD-10-CM | POA: Diagnosis not present

## 2024-11-05 NOTE — Telephone Encounter (Signed)
 Pt has decided not to continue with Prolia . Please advise

## 2024-11-08 NOTE — Telephone Encounter (Signed)
Noted. Referral closed.

## 2024-11-09 ENCOUNTER — Other Ambulatory Visit: Payer: Self-pay | Admitting: Family Medicine

## 2024-11-12 DIAGNOSIS — J301 Allergic rhinitis due to pollen: Secondary | ICD-10-CM | POA: Diagnosis not present

## 2024-11-12 DIAGNOSIS — J3081 Allergic rhinitis due to animal (cat) (dog) hair and dander: Secondary | ICD-10-CM | POA: Diagnosis not present

## 2024-11-12 DIAGNOSIS — J3089 Other allergic rhinitis: Secondary | ICD-10-CM | POA: Diagnosis not present

## 2024-11-19 DIAGNOSIS — J3081 Allergic rhinitis due to animal (cat) (dog) hair and dander: Secondary | ICD-10-CM | POA: Diagnosis not present

## 2024-11-19 DIAGNOSIS — J301 Allergic rhinitis due to pollen: Secondary | ICD-10-CM | POA: Diagnosis not present

## 2024-11-19 DIAGNOSIS — J3089 Other allergic rhinitis: Secondary | ICD-10-CM | POA: Diagnosis not present

## 2024-11-24 ENCOUNTER — Telehealth: Payer: Self-pay | Admitting: Adult Health

## 2024-11-24 ENCOUNTER — Telehealth: Admitting: Adult Health

## 2024-11-24 ENCOUNTER — Other Ambulatory Visit: Payer: Self-pay | Admitting: Adult Health

## 2024-11-24 ENCOUNTER — Encounter: Payer: Self-pay | Admitting: Adult Health

## 2024-11-24 DIAGNOSIS — F411 Generalized anxiety disorder: Secondary | ICD-10-CM | POA: Diagnosis not present

## 2024-11-24 DIAGNOSIS — F331 Major depressive disorder, recurrent, moderate: Secondary | ICD-10-CM

## 2024-11-24 MED ORDER — DESVENLAFAXINE SUCCINATE ER 25 MG PO TB24: 25.0000 mg | ORAL_TABLET | Freq: Every day | ORAL | 0 refills | Status: DC

## 2024-11-24 MED ORDER — VORTIOXETINE HBR 10 MG PO TABS: 10.0000 mg | ORAL_TABLET | Freq: Every day | ORAL | 2 refills | Status: DC

## 2024-11-24 NOTE — Progress Notes (Signed)
 Morgan [name] [id number] 1951-09-16 73 y.o.  Virtual Visit via Video Note  I connected with pt @ on 11/24/24 at  3:00 PM EST by a video enabled telemedicine application and verified that I am speaking with the correct person using two identifiers.   I discussed the limitations of evaluation and management by telemedicine and the availability of in person appointments. The patient expressed understanding and agreed to proceed.  I discussed the assessment and treatment plan with the patient. The patient was provided an opportunity to ask questions and all were answered. The patient agreed with the plan and demonstrated an understanding of the instructions.   The patient was advised to call back or seek an in-person evaluation if the symptoms worsen or if the condition fails to improve as anticipated.  I provided 20 minutes of non-face-to-face time during this encounter.  The patient was located at home.  The provider was located at [facility].   Angeline LOISE Sayers, NP   Subjective:   Patient ID:  Morgan [name] is a 73 y.o. (DOB Jul 24, 1951) female.  Chief Complaint: No chief complaint on file.   HPI Railyn House presents for follow-up of MDD and GAD.  Followed by PCP - Dr. Jodie.  Describes mood today as about the same. Pleasant. Denies tearfulness. Mood symptoms - reports feeling depressed. Reports varying interest and motivation. Reports some anxiety and irritability. Denies panic attacks. Reports some worry, rumination and over thinking - state of the country. Denies obsessive thoughts or acts. Reports mood as lower - more so in the mornings. Stating my mood is not where I want it to be. Taking medications as prescribed, but feels she is still struggling with mood symptoms. She is willing to consider other options.  Energy levels improved. Active, exercising and is active.  Enjoys some usual interests and activities. Widowed. Lives alone with  dog and cat. She has no family local. Daughter lives in [place] . Appetite adequate. Weight stable - 138 pounds - 63. Sleeps well most nights. Averages 9 to 10 hours. Denies daytime napping. Focus and concentration difficulties - a little bit. Completing tasks. Managing aspects of household. Retired - 2012. Denies SI or HI.  Denies AH or VH. Denies self harm. Denies substance use.  Previous medication trials:   Cymbalta  Trintellix  Seroquel  Valium  Trazadone Belsomra Wellbutrin  XL Pristiq   Lamictal  Zoloft   Review of Systems:  Review of Systems  Musculoskeletal:  Negative for gait problem.  Neurological:  Negative for tremors.  Psychiatric/Behavioral:         Please refer to HPI    Medications: I have reviewed the patient's current medications.  Current Outpatient Medications  Medication Sig Dispense Refill   atorvastatin  (LIPITOR) 20 MG tablet TAKE 1 TABLET (20 MG TOTAL) BY MOUTH AT BEDTIME. TAKE 1 TABLET BY MOUTH EVERY DAY 90 tablet 3   azelastine  (ASTELIN ) 0.1 % nasal spray Place 2 sprays into both nostrils daily as needed for allergies.     desvenlafaxine  (PRISTIQ ) 25 MG 24 hr tablet TAKE 1 TABLET (25 MG TOTAL) BY MOUTH DAILY. STOP SERTRALINE  30 tablet 0   diazepam  (VALIUM ) 5 MG tablet Take 0.5-1 tablets (2.5-5 mg total) by mouth at bedtime as needed for sedation. 30 tablet 1   DULoxetine  (CYMBALTA ) 30 MG capsule Take 1 capsule (30 mg total) by mouth daily. 90 capsule 3   EPINEPHrine 0.3 mg/0.3 mL IJ SOAJ injection Inject 0.3 mg into the muscle as needed for anaphylaxis. On hand  lamoTRIgine  (LAMICTAL ) 25 MG tablet Take one tablet at bedtime for 2 weeks, then increase to two tablets at bedtime. 60 tablet 2   levothyroxine  (SYNTHROID ) 50 MCG tablet TAKE 1 TABLET (50 MCG TOTAL) BY MOUTH DAILY BEFORE BREAKFAST. EVERY TUESDAY AND THURSDAY 30 tablet 11   levothyroxine  (SYNTHROID ) 75 MCG tablet TAKE 1 TABLET BY MOUTH EVERY DAY 90 tablet 3   sertraline  (ZOLOFT ) 50 MG  tablet Take 1 tablet (50 mg total) by mouth daily. 30 tablet 2   valACYclovir  (VALTREX ) 1000 MG tablet Take 2 tablets (2,000 mg total) by mouth 2 (two) times daily as needed. Take for cold sores prn 20 tablet 2   Current Facility-Administered Medications  Medication Dose Route Frequency Provider Last Rate Last Admin   denosumab  (PROLIA ) injection 60 mg  60 mg Subcutaneous Once Andy, Camille L, MD        Medication Side Effects: None  Allergies:  Allergies  Allergen Reactions   Simvastatin  Other (See Comments)    Myalgia   Sulfonamide Derivatives Rash    Past Medical History:  Diagnosis Date   Allergy    Anxiety and depression 02/22/2014   Arthritis    Cancer (HCC) 10/2022   right kidney   Depression    Diastolic dysfunction    Negative Nuclear Stress Test 08/09/12.   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 Neda pulm   OSA (obstructive sleep apnea)    Mild. No CPAP.   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   Seasonal allergies    Sleep apnea    mild not on CPAP   Tinnitus     Family History  Problem Relation Age of Onset   Macular degeneration Mother    Heart attack Father    Hyperlipidemia Father    Heart disease Father    Diabetes Father    Brain cancer Father    Arthritis Father    Cancer Father    Migraines Daughter    Pulmonary embolism Daughter    Breast cancer Maternal Aunt 48 - 89   Breast cancer Paternal Aunt 64 - 89   Diabetes Paternal Grandfather    Diabetes Brother    Intellectual disability Brother    Arthritis Other    Colon polyps Neg Hx    Colon cancer Neg Hx    Esophageal cancer Neg Hx    Rectal cancer Neg Hx    Stomach cancer Neg Hx    Crohn's disease Neg Hx    Ulcerative colitis Neg Hx     Social History    Socioeconomic History   Marital status: Widowed    Spouse name: Not on file   Number of children: 1   Years of education: Not on file   Highest education level: Bachelor's degree (e.g., BA, AB, BS)  Occupational History    Comment: retired    Occupation: Retired Photographer  Tobacco Use   Smoking status: Never    Passive exposure: Never   Smokeless tobacco: Never  Vaping Use   Vaping status: Never Used  Substance and Sexual Activity   Alcohol use: Not Currently    Comment: rare   Drug use: No   Sexual activity: Not Currently    Comment: lives with husband, 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 No major dietary restrictions  Other Topics Concern   Not on file  Social History Narrative   Right handed   Caffeine use: 1/2 caffeine in the morning, glass of tea with meal once per day   Lives alone   Social Drivers of Health   Financial Resource Strain: Low Risk  (07/11/2024)   Overall Financial Resource Strain (CARDIA)    Difficulty of Paying Living Expenses: Not hard at all  Food Insecurity: No Food Insecurity (07/11/2024)   Hunger Vital Sign    Worried About Running Out of Food in the Last Year: Never true    Ran Out of Food in the Last Year: Never true  Transportation Needs: No Transportation Needs (07/11/2024)   PRAPARE - Administrator, Civil Service (Medical): No    Lack of Transportation (Non-Medical): No  Physical Activity: Insufficiently Active (07/11/2024)   Exercise Vital Sign    Days of Exercise per Week: 7 days    Minutes of Exercise per Session: 20 min  Stress: No Stress Concern Present (07/11/2024)   [employer] of Occupational Health - Occupational Stress Questionnaire    Feeling of Stress: Only a little  Social Connections: Socially Isolated (07/11/2024)   Social Connection and Isolation Panel    Frequency of Communication with Friends and Family: Once a week    Frequency of Social Gatherings with Friends and Family: Once a week    Attends  Religious Services: Never    Database Administrator or Organizations: No    Attends Engineer, Structural: Not on file    Marital Status: Widowed  Intimate Partner Violence: Not At Risk (05/19/2023)   Humiliation, Afraid, Rape, and Kick questionnaire    Fear of Current or Ex-Partner: No    Emotionally Abused: No    Physically Abused: No    Sexually Abused: No    Past Medical History, Surgical history, Social history, and Family history were reviewed and updated as appropriate.   Please see review of systems for further details on the patient's review from today.   Objective:   Physical Exam:  There were no vitals taken for this visit.  Physical Exam Constitutional:      General: She is not in acute distress. Musculoskeletal:        General: No deformity.  Neurological:     Mental Status: She is alert and oriented to person, place, and time.     Coordination: Coordination normal.  Psychiatric:        Attention and Perception: Attention and perception normal. She does not perceive auditory or visual hallucinations.        Mood and Affect: Mood normal. Mood is not anxious or depressed. Affect is not labile, blunt, angry or inappropriate.        Speech: Speech normal.        Behavior: Behavior normal.        Thought Content: Thought content normal. Thought content is not paranoid or delusional. Thought content does not include homicidal or suicidal ideation. Thought content does not include homicidal or suicidal plan.        Cognition and Memory: Cognition and memory normal.        Judgment: Judgment normal.     Comments: Insight intact     Lab Review:     Component Value Date/Time   NA 140 07/15/2024 1118   NA 144 01/30/2024 1143   K 4.5 07/15/2024 1118   CL 104 07/15/2024  1118   CO2 28 07/15/2024 1118   GLUCOSE 110 (H) 07/15/2024 1118   BUN 24 (H) 07/15/2024 1118   BUN 23 01/30/2024 1143   CREATININE 1.31 (H) 07/15/2024 1118   CREATININE 0.80 09/25/2020 1443    CALCIUM  9.6 07/15/2024 1118   PROT 6.4 07/15/2024 1118   PROT 7.3 01/30/2024 1143   ALBUMIN  4.2 07/15/2024 1118   ALBUMIN  4.6 01/30/2024 1143   AST 20 07/15/2024 1118   ALT 13 07/15/2024 1118   ALKPHOS 71 07/15/2024 1118   BILITOT 0.7 07/15/2024 1118   BILITOT 0.5 01/30/2024 1143   GFRNONAA 37 (L) 01/01/2023 0506   GFRNONAA 74 09/25/2020 1443   GFRAA 86 09/25/2020 1443       Component Value Date/Time   WBC 5.7 07/15/2024 1118   RBC 3.78 (L) 07/15/2024 1118   HGB 11.7 (L) 07/15/2024 1118   HGB 12.6 01/30/2024 1143   HCT 35.1 (L) 07/15/2024 1118   HCT 38.6 01/30/2024 1143   PLT 283.0 07/15/2024 1118   PLT 342 01/30/2024 1143   MCV 92.8 07/15/2024 1118   MCV 94 01/30/2024 1143   MCH 30.8 01/30/2024 1143   MCH 31.3 12/20/2022 1430   MCHC 33.3 07/15/2024 1118   RDW 14.1 07/15/2024 1118   RDW 12.9 01/30/2024 1143   LYMPHSABS 2.2 07/15/2024 1118   LYMPHSABS 2.7 01/30/2024 1143   MONOABS 0.3 07/15/2024 1118   EOSABS 0.3 07/15/2024 1118   EOSABS 0.3 01/30/2024 1143   BASOSABS 0.0 07/15/2024 1118   BASOSABS 0.0 01/30/2024 1143    No results found for: POCLITH, LITHIUM   No results found for: PHENYTOIN, PHENOBARB, VALPROATE, CBMZ   .res Assessment: Plan:    Treatment Plan/Recommendations:   Plan:  PDMP reviewed  D/C Pristiq  25mg  every morning  Add Trintellix  10mg  - 1/2 tablet daily x 7 days, then increase to one tablet daily.  Consider Abilify  20 minutes spent dedicated to the care of this patient on the date of this encounter to include pre-visit review of records, ordering of medication, post visit documentation, and face-to-face time with the patient discussing depression and anxiety. Discussed continuing current medication regimen.  RTC 4 weeks  Patient advised to contact office with any questions, adverse effects, or acute worsening in signs and symptoms.   There are no diagnoses linked to this encounter.   Please see After Visit Summary  for patient specific instructions.  No future appointments.  No orders of the defined types were placed in this encounter.     -------------------------------

## 2024-11-24 NOTE — Telephone Encounter (Signed)
 Pt had appt today. Gina sent in Trintellix  and pt said her pharmacy says it needs  a PA. Please start PA process. In the meantime, she is completely out of Pristiq  and wants to continue that for now until she can see if new med Trintellix  is approved and how much it will cost. Please send in Pristiq  25mg  for her to : [id number] [place], [place] - [address]  [address], [place] [id number]

## 2024-11-24 NOTE — Telephone Encounter (Signed)
 PA Trintellix  10 mg  [facility] Approved 12/31/23-12/29/24

## 2024-11-30 DIAGNOSIS — J3081 Allergic rhinitis due to animal (cat) (dog) hair and dander: Secondary | ICD-10-CM | POA: Diagnosis not present

## 2024-11-30 DIAGNOSIS — J3089 Other allergic rhinitis: Secondary | ICD-10-CM | POA: Diagnosis not present

## 2024-11-30 DIAGNOSIS — J301 Allergic rhinitis due to pollen: Secondary | ICD-10-CM | POA: Diagnosis not present

## 2024-12-01 ENCOUNTER — Telehealth: Payer: Self-pay | Admitting: Adult Health

## 2024-12-01 NOTE — Telephone Encounter (Signed)
 Patient called in stating regarding Trintellix  medication. States that the medication is $177 and that is kind of expensive. She is not doing well. asked is there another recommendation that she could try. Pls rtc [phone]

## 2024-12-02 NOTE — Telephone Encounter (Signed)
 Did she fill Trintellix ? Did she get samples and have benefit?

## 2024-12-03 NOTE — Telephone Encounter (Signed)
 Pharmacy said pt filled on 12/1.

## 2024-12-07 ENCOUNTER — Other Ambulatory Visit: Payer: Self-pay | Admitting: Adult Health

## 2024-12-07 DIAGNOSIS — F411 Generalized anxiety disorder: Secondary | ICD-10-CM

## 2024-12-07 DIAGNOSIS — F331 Major depressive disorder, recurrent, moderate: Secondary | ICD-10-CM

## 2024-12-07 DIAGNOSIS — J301 Allergic rhinitis due to pollen: Secondary | ICD-10-CM | POA: Diagnosis not present

## 2024-12-07 DIAGNOSIS — J3081 Allergic rhinitis due to animal (cat) (dog) hair and dander: Secondary | ICD-10-CM | POA: Diagnosis not present

## 2024-12-07 DIAGNOSIS — J3089 Other allergic rhinitis: Secondary | ICD-10-CM | POA: Diagnosis not present

## 2024-12-07 MED ORDER — DESVENLAFAXINE SUCCINATE ER 50 MG PO TB24: 50.0000 mg | ORAL_TABLET | Freq: Every day | ORAL | 2 refills | Status: DC

## 2024-12-07 NOTE — Telephone Encounter (Signed)
 Pt seen 11/26:  D/C Pristiq  25mg  every morning   Add Trintellix  10mg  - 1/2 tablet daily x 7 days, then increase to one tablet daily.   Consider Abilify  She said she isn't going to take the Trintellix  due to cost. She is still taking the Pristiq  25 mg and doesn't feel it is addressing her depression.  Rates depression as 8/10, anxiety as minimal. No new stressors.  Overall sleeping well, does sometimes have trouble getting to sleep, gets up 2-3 times a night for RR, but can usually go back to sleep. Has diazepam  from PCP if she can't get back to sleep, only takes a partial tablet when needed.  Affect is flat and negative.   FU 12/22.

## 2024-12-15 DIAGNOSIS — J3089 Other allergic rhinitis: Secondary | ICD-10-CM | POA: Diagnosis not present

## 2024-12-15 DIAGNOSIS — J301 Allergic rhinitis due to pollen: Secondary | ICD-10-CM | POA: Diagnosis not present

## 2024-12-15 DIAGNOSIS — J3081 Allergic rhinitis due to animal (cat) (dog) hair and dander: Secondary | ICD-10-CM | POA: Diagnosis not present

## 2024-12-20 ENCOUNTER — Encounter: Payer: Self-pay | Admitting: Adult Health

## 2024-12-20 ENCOUNTER — Telehealth (INDEPENDENT_AMBULATORY_CARE_PROVIDER_SITE_OTHER): Admitting: Adult Health

## 2024-12-20 DIAGNOSIS — F329 Major depressive disorder, single episode, unspecified: Secondary | ICD-10-CM | POA: Diagnosis not present

## 2024-12-20 DIAGNOSIS — F411 Generalized anxiety disorder: Secondary | ICD-10-CM | POA: Diagnosis not present

## 2024-12-20 DIAGNOSIS — F331 Major depressive disorder, recurrent, moderate: Secondary | ICD-10-CM

## 2024-12-20 MED ORDER — FLUOXETINE HCL 10 MG PO CAPS: 10.0000 mg | ORAL_CAPSULE | Freq: Every day | ORAL | 0 refills | Status: AC

## 2024-12-20 MED ORDER — FLUOXETINE HCL 20 MG PO CAPS: 20.0000 mg | ORAL_CAPSULE | Freq: Every day | ORAL | 2 refills | Status: AC

## 2024-12-20 NOTE — Progress Notes (Signed)
 Morgan [name] [id number] 1951/03/02 73 y.o.  Virtual Visit via Video Note  I connected with pt @ on 12/20/2024 at  3:00 PM EST by a video enabled telemedicine application and verified that I am speaking with the correct person using two identifiers.   I discussed the limitations of evaluation and management by telemedicine and the availability of in person appointments. The patient expressed understanding and agreed to proceed.  I discussed the assessment and treatment plan with the patient. The patient was provided an opportunity to ask questions and all were answered. The patient agreed with the plan and demonstrated an understanding of the instructions.   The patient was advised to call back or seek an in-person evaluation if the symptoms worsen or if the condition fails to improve as anticipated.  I provided 25 minutes of non-face-to-face time during this encounter.  The patient was located at home.  The provider was located at [facility].   Morgan LOISE Sayers, NP   Subjective:   Patient ID:  Morgan [name] is a 73 y.o. (DOB [birth year]) female.  Chief Complaint: No chief complaint on file.   HPI Morgan [name] presents for follow-up of MDD and GAD.  Followed by PCP - Dr. Jodie.  Describes mood today as about the same. Pleasant. Denies tearfulness. Mood symptoms - reports feeling depressed - 7 out of 10. Reports lower interest and motivation. Denies anxiety and irritability. Denies panic attacks. Reports some worry, rumination and over thinking - some. Denies obsessive thoughts or acts. Reports mood as lower. Stating I don't feel like I can get motivated to do much. Taking medications as prescribed. She is willing to consider other options.  Energy levels lower. Active, exercising and is active.  Enjoys some usual interests and activities. Widowed. Lives alone with dog and cat. She has no family local. Daughter lives in [place] . Appetite  adequate. Weight stable - 138 pounds - 63. Sleeps well most nights. Averages 9 to 10 hours. Denies daytime napping. Focus and concentration difficulties - a little bit. Completing tasks. Managing aspects of household. Retired - 2012. Denies SI or HI.  Denies AH or VH. Denies self harm. Denies substance use.  Previous medication trials:   Cymbalta  Trintellix  - too expensive Seroquel  Valium  Trazadone Belsomra Wellbutrin  XL Pristiq   Lamictal  Zoloft   Review of Systems:  Review of Systems  Musculoskeletal:  Negative for gait problem.  Neurological:  Negative for tremors.  Psychiatric/Behavioral:         Please refer to HPI    Medications: I have reviewed the patient's current medications.  Current Outpatient Medications  Medication Sig Dispense Refill   atorvastatin  (LIPITOR) 20 MG tablet TAKE 1 TABLET (20 MG TOTAL) BY MOUTH AT BEDTIME. TAKE 1 TABLET BY MOUTH EVERY DAY 90 tablet 3   azelastine  (ASTELIN ) 0.1 % nasal spray Place 2 sprays into both nostrils daily as needed for allergies.     desvenlafaxine  (PRISTIQ ) 50 MG 24 hr tablet Take 1 tablet (50 mg total) by mouth daily. 30 tablet 2   diazepam  (VALIUM ) 5 MG tablet Take 0.5-1 tablets (2.5-5 mg total) by mouth at bedtime as needed for sedation. 30 tablet 1   EPINEPHrine 0.3 mg/0.3 mL IJ SOAJ injection Inject 0.3 mg into the muscle as needed for anaphylaxis. On hand     levothyroxine  (SYNTHROID ) 50 MCG tablet TAKE 1 TABLET (50 MCG TOTAL) BY MOUTH DAILY BEFORE BREAKFAST. EVERY TUESDAY AND THURSDAY 30 tablet 11   levothyroxine  (SYNTHROID ) 75 MCG tablet TAKE  1 TABLET BY MOUTH EVERY DAY 90 tablet 3   valACYclovir  (VALTREX ) 1000 MG tablet Take 2 tablets (2,000 mg total) by mouth 2 (two) times daily as needed. Take for cold sores prn 20 tablet 2   vortioxetine  HBr (TRINTELLIX ) 10 MG TABS tablet Take 1 tablet (10 mg total) by mouth daily. 30 tablet 2   Current Facility-Administered Medications  Medication Dose Route Frequency  Provider Last Rate Last Admin   denosumab  (PROLIA ) injection 60 mg  60 mg Subcutaneous Once Andy, Camille L, MD        Medication Side Effects: None  Allergies: Allergies[1]  Past Medical History:  Diagnosis Date   Allergy    Anxiety and depression 02/22/2014   Arthritis    Cancer (HCC) 10/2022   right kidney   Depression    Diastolic dysfunction    Negative Nuclear Stress Test 08/09/12.   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 Neda pulm   OSA (obstructive sleep apnea)    Mild. No CPAP.   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   Seasonal allergies    Sleep apnea    mild not on CPAP   Tinnitus     Family History  Problem Relation Age of Onset   Macular degeneration Mother    Heart attack Father    Hyperlipidemia Father    Heart disease Father    Diabetes Father    Brain cancer Father    Arthritis Father    Cancer Father    Migraines Daughter    Pulmonary embolism Daughter    Breast cancer Maternal Aunt 57 - 89   Breast cancer Paternal Aunt 37 - 89   Diabetes Paternal Grandfather    Diabetes Brother    Intellectual disability Brother    Arthritis Other    Colon polyps Neg Hx    Colon cancer Neg Hx    Esophageal cancer Neg Hx    Rectal cancer Neg Hx    Stomach cancer Neg Hx    Crohn's disease Neg Hx    Ulcerative colitis Neg Hx     Social History   Socioeconomic History   Marital status: Widowed    Spouse name: Not on file   Number of children: 1   Years of education: Not on file   Highest education level: Bachelor's degree (e.g., BA, AB, BS)  Occupational History    Comment: retired    Occupation: Retired Photographer  Tobacco Use   Smoking status: Never    Passive exposure: Never   Smokeless tobacco: Never  Vaping Use   Vaping  status: Never Used  Substance and Sexual Activity   Alcohol use: Not Currently    Comment: rare   Drug use: No   Sexual activity: Not Currently    Comment: lives with husband, 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 No major dietary restrictions  Other Topics Concern   Not on file  Social History Narrative   Right handed   Caffeine use: 1/2 caffeine in the morning, glass of tea with meal once per day   Lives alone   Social Drivers of Health   Tobacco Use: Low Risk (11/24/2024)   Patient History    Smoking Tobacco Use: Never    Smokeless Tobacco Use:  Never    Passive Exposure: Never  Financial Resource Strain: Low Risk (07/11/2024)   Overall Financial Resource Strain (CARDIA)    Difficulty of Paying Living Expenses: Not hard at all  Food Insecurity: No Food Insecurity (07/11/2024)   Epic    Worried About Programme Researcher, Broadcasting/film/video in the Last Year: Never true    Ran Out of Food in the Last Year: Never true  Transportation Needs: No Transportation Needs (07/11/2024)   Epic    Lack of Transportation (Medical): No    Lack of Transportation (Non-Medical): No  Physical Activity: Insufficiently Active (07/11/2024)   Exercise Vital Sign    Days of Exercise per Week: 7 days    Minutes of Exercise per Session: 20 min  Stress: No Stress Concern Present (07/11/2024)   [employer] of Occupational [employer] - Occupational Stress Questionnaire    Feeling of Stress: Only a little  Social Connections: Socially Isolated (07/11/2024)   Social Connection and Isolation Panel    Frequency of Communication with Friends and Family: Once a week    Frequency of Social Gatherings with Friends and Family: Once a week    Attends Religious Services: Never    Database Administrator or Organizations: No    Attends Engineer, Structural: Not on file    Marital Status: Widowed  Intimate Partner Violence: Not At Risk (05/19/2023)   Humiliation, Afraid, Rape, and Kick questionnaire    Fear of Current or  Ex-Partner: No    Emotionally Abused: No    Physically Abused: No    Sexually Abused: No  Depression (PHQ2-9): Low Risk (09/01/2024)   Depression (PHQ2-9)    PHQ-2 Score: 0  Alcohol Screen: Not on file  Housing: Low Risk (07/11/2024)   Epic    Unable to Pay for Housing in the Last Year: No    Number of Times Moved in the Last Year: 0    Homeless in the Last Year: No  Utilities: Not At Risk (05/19/2023)   [facility] Utilities    Threatened with loss of utilities: No  Health Literacy: Not on file    Past Medical History, Surgical history, Social history, and Family history were reviewed and updated as appropriate.   Please see review of systems for further details on the patient's review from today.   Objective:   Physical Exam:  There were no vitals taken for this visit.  Physical Exam Constitutional:      General: She is not in acute distress. Musculoskeletal:        General: No deformity.  Neurological:     Mental Status: She is alert and oriented to person, place, and time.     Coordination: Coordination normal.  Psychiatric:        Attention and Perception: Attention and perception normal. She does not perceive auditory or visual hallucinations.        Mood and Affect: Mood normal. Mood is not anxious or depressed. Affect is not labile, blunt, angry or inappropriate.        Speech: Speech normal.        Behavior: Behavior normal.        Thought Content: Thought content normal. Thought content is not paranoid or delusional. Thought content does not include homicidal or suicidal ideation. Thought content does not include homicidal or suicidal plan.        Cognition and Memory: Cognition and memory normal.        Judgment: Judgment normal.  Comments: Insight intact     Lab Review:     Component Value Date/Time   NA 140 07/15/2024 1118   NA 144 01/30/2024 1143   K 4.5 07/15/2024 1118   CL 104 07/15/2024 1118   CO2 28 07/15/2024 1118   GLUCOSE 110 (H) 07/15/2024 1118    BUN 24 (H) 07/15/2024 1118   BUN 23 01/30/2024 1143   CREATININE 1.31 (H) 07/15/2024 1118   CREATININE 0.80 09/25/2020 1443   CALCIUM  9.6 07/15/2024 1118   PROT 6.4 07/15/2024 1118   PROT 7.3 01/30/2024 1143   ALBUMIN  4.2 07/15/2024 1118   ALBUMIN  4.6 01/30/2024 1143   AST 20 07/15/2024 1118   ALT 13 07/15/2024 1118   ALKPHOS 71 07/15/2024 1118   BILITOT 0.7 07/15/2024 1118   BILITOT 0.5 01/30/2024 1143   GFRNONAA 37 (L) 01/01/2023 0506   GFRNONAA 74 09/25/2020 1443   GFRAA 86 09/25/2020 1443       Component Value Date/Time   WBC 5.7 07/15/2024 1118   RBC 3.78 (L) 07/15/2024 1118   HGB 11.7 (L) 07/15/2024 1118   HGB 12.6 01/30/2024 1143   HCT 35.1 (L) 07/15/2024 1118   HCT 38.6 01/30/2024 1143   PLT 283.0 07/15/2024 1118   PLT 342 01/30/2024 1143   MCV 92.8 07/15/2024 1118   MCV 94 01/30/2024 1143   MCH 30.8 01/30/2024 1143   MCH 31.3 12/20/2022 1430   MCHC 33.3 07/15/2024 1118   RDW 14.1 07/15/2024 1118   RDW 12.9 01/30/2024 1143   LYMPHSABS 2.2 07/15/2024 1118   LYMPHSABS 2.7 01/30/2024 1143   MONOABS 0.3 07/15/2024 1118   EOSABS 0.3 07/15/2024 1118   EOSABS 0.3 01/30/2024 1143   BASOSABS 0.0 07/15/2024 1118   BASOSABS 0.0 01/30/2024 1143    No results found for: POCLITH, LITHIUM   No results found for: PHENYTOIN, PHENOBARB, VALPROATE, CBMZ   .res Assessment: Plan:    Treatment Plan/Recommendations:   Plan:  PDMP reviewed  D/C Pristiq  50mg  every morning - stopped prior to appointment.  Add Prozac  20mg  daily - start with 10mg  daily for 7 days, then increase to 20mg .  Consider Abilify  25 minutes spent dedicated to the care of this patient on the date of this encounter to include pre-visit review of records, ordering of medication, post visit documentation, and face-to-face time with the patient discussing depression and anxiety. Discussed continuing current medication regimen.  RTC 4 weeks  Patient advised to contact office with any  questions, adverse effects, or acute worsening in signs and symptoms.  There are no diagnoses linked to this encounter.   Please see After Visit Summary for patient specific instructions.  No future appointments.  No orders of the defined types were placed in this encounter.     -------------------------------     [1]  Allergies Allergen Reactions   Simvastatin  Other (See Comments)    Myalgia   Sulfonamide Derivatives Rash

## 2024-12-21 DIAGNOSIS — J301 Allergic rhinitis due to pollen: Secondary | ICD-10-CM | POA: Diagnosis not present

## 2024-12-21 DIAGNOSIS — J3089 Other allergic rhinitis: Secondary | ICD-10-CM | POA: Diagnosis not present

## 2024-12-21 DIAGNOSIS — J3081 Allergic rhinitis due to animal (cat) (dog) hair and dander: Secondary | ICD-10-CM | POA: Diagnosis not present

## 2025-01-18 ENCOUNTER — Telehealth: Admitting: Adult Health

## 2025-01-18 ENCOUNTER — Encounter: Payer: Self-pay | Admitting: Adult Health

## 2025-01-18 DIAGNOSIS — F411 Generalized anxiety disorder: Secondary | ICD-10-CM

## 2025-01-18 DIAGNOSIS — F339 Major depressive disorder, recurrent, unspecified: Secondary | ICD-10-CM

## 2025-01-18 DIAGNOSIS — F331 Major depressive disorder, recurrent, moderate: Secondary | ICD-10-CM

## 2025-01-18 MED ORDER — VILAZODONE HCL 10 MG PO TABS: 10.0000 mg | ORAL_TABLET | Freq: Every day | ORAL | 1 refills | Status: AC

## 2025-01-18 NOTE — Progress Notes (Signed)
 Morgan [name] [id number] [birth year] 74 y.o.  Virtual Visit via Video Note  I connected with pt @ on 01/18/25 at  3:30 PM EST by a video enabled telemedicine application and verified that I am speaking with the correct person using two identifiers.   I discussed the limitations of evaluation and management by telemedicine and the availability of in person appointments. The patient expressed understanding and agreed to proceed.  I discussed the assessment and treatment plan with the patient. The patient was provided an opportunity to ask questions and all were answered. The patient agreed with the plan and demonstrated an understanding of the instructions.   The patient was advised to call back or seek an in-person evaluation if the symptoms worsen or if the condition fails to improve as anticipated.  I provided 25 minutes of non-face-to-face time during this encounter.  The patient was located at home.  The provider was located at [facility].   Angeline LOISE Sayers, NP   Subjective:   Patient ID:  Morgan [name] is a 74 y.o. (DOB 06-Oct-1951) female.  Chief Complaint: No chief complaint on file.   HPI Morgan [name] presents for follow-up of MDD and GAD.  Followed by PCP - Dr. Jodie.  Describes mood today as still the same. Pleasant. Denies tearfulness. Mood symptoms - reports feeling depressed - not a lot better. Reports lower interest and motivation. Reports some anxiety at times. Denies irritability. Denies panic attacks. Reports worry, rumination and over thinking - some. Denies obsessive thoughts or acts. Reports mood as lower. Stating I feel about the same - still down. Taking medications as prescribed. She is willing to consider other options.  Energy levels lower. Active, exercising and is active.  Enjoys some usual interests and activities. Widowed. Lives alone with dog and cat. She has no family local. Daughter lives in [place] . Reports  friend (ex-husband) visited recently. Appetite adequate. Weight stable - 138 pounds - 63. Sleeps well most nights. Averages 9 to 10 hours. Reports occasional daytime napping. Focus and concentration difficulties - somewhat. Completing tasks. Managing aspects of household. Retired - 2012. Denies SI or HI.  Denies AH or VH. Denies self harm. Denies substance use.  Previous medication trials:   Cymbalta  Trintellix  - too expensive Seroquel  Valium  Trazadone Belsomra Wellbutrin  XL Pristiq   Lamictal  Zoloft  Prozac   Review of Systems:  Review of Systems  Musculoskeletal:  Negative for gait problem.  Neurological:  Negative for tremors.  Psychiatric/Behavioral:         Please refer to HPI    Medications: I have reviewed the patient's current medications.  Current Outpatient Medications  Medication Sig Dispense Refill   atorvastatin  (LIPITOR) 20 MG tablet TAKE 1 TABLET (20 MG TOTAL) BY MOUTH AT BEDTIME. TAKE 1 TABLET BY MOUTH EVERY DAY 90 tablet 3   azelastine  (ASTELIN ) 0.1 % nasal spray Place 2 sprays into both nostrils daily as needed for allergies.     diazepam  (VALIUM ) 5 MG tablet Take 0.5-1 tablets (2.5-5 mg total) by mouth at bedtime as needed for sedation. 30 tablet 1   EPINEPHrine 0.3 mg/0.3 mL IJ SOAJ injection Inject 0.3 mg into the muscle as needed for anaphylaxis. On hand     FLUoxetine  (PROZAC ) 10 MG capsule Take 1 capsule (10 mg total) by mouth daily. 7 capsule 0   FLUoxetine  (PROZAC ) 20 MG capsule Take 1 capsule (20 mg total) by mouth daily. 30 capsule 2   levothyroxine  (SYNTHROID ) 50 MCG tablet TAKE 1 TABLET (50 MCG  TOTAL) BY MOUTH DAILY BEFORE BREAKFAST. EVERY TUESDAY AND THURSDAY 30 tablet 11   levothyroxine  (SYNTHROID ) 75 MCG tablet TAKE 1 TABLET BY MOUTH EVERY DAY 90 tablet 3   valACYclovir  (VALTREX ) 1000 MG tablet Take 2 tablets (2,000 mg total) by mouth 2 (two) times daily as needed. Take for cold sores prn 20 tablet 2   Current Facility-Administered  Medications  Medication Dose Route Frequency Provider Last Rate Last Admin   denosumab  (PROLIA ) injection 60 mg  60 mg Subcutaneous Once Andy, Camille L, MD        Medication Side Effects: None  Allergies: Allergies[1]  Past Medical History:  Diagnosis Date   Allergy    Anxiety and depression 02/22/2014   Arthritis    Cancer (HCC) 10/2022   right kidney   Depression    Diastolic dysfunction    Negative Nuclear Stress Test 08/09/12.   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 Neda pulm   OSA (obstructive sleep apnea)    Mild. No CPAP.   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   Seasonal allergies    Sleep apnea    mild not on CPAP   Tinnitus     Family History  Problem Relation Age of Onset   Macular degeneration Mother    Heart attack Father    Hyperlipidemia Father    Heart disease Father    Diabetes Father    Brain cancer Father    Arthritis Father    Cancer Father    Migraines Daughter    Pulmonary embolism Daughter    Breast cancer Maternal Aunt 30 - 89   Breast cancer Paternal Aunt 48 - 89   Diabetes Paternal Grandfather    Diabetes Brother    Intellectual disability Brother    Arthritis Other    Colon polyps Neg Hx    Colon cancer Neg Hx    Esophageal cancer Neg Hx    Rectal cancer Neg Hx    Stomach cancer Neg Hx    Crohn's disease Neg Hx    Ulcerative colitis Neg Hx     Social History   Socioeconomic History   Marital status: Widowed    Spouse name: Not on file   Number of children: 1   Years of education: Not on file   Highest education level: Bachelor's degree (e.g., BA, AB, BS)  Occupational History    Comment: retired    Occupation: Retired Photographer  Tobacco Use   Smoking status: Never    Passive exposure: Never    Smokeless tobacco: Never  Vaping Use   Vaping status: Never Used  Substance and Sexual Activity   Alcohol use: Not Currently    Comment: rare   Drug use: No   Sexual activity: Not Currently    Comment: lives with husband, 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 No major dietary restrictions  Other Topics Concern   Not on file  Social History Narrative   Right handed   Caffeine use: 1/2 caffeine in the morning, glass of tea with meal once per day   Lives alone   Social Drivers of Health   Tobacco Use: Low Risk (12/20/2024)   Patient History    Smoking Tobacco Use: Never    Smokeless Tobacco Use:  Never    Passive Exposure: Never  Financial Resource Strain: Low Risk (07/11/2024)   Overall Financial Resource Strain (CARDIA)    Difficulty of Paying Living Expenses: Not hard at all  Food Insecurity: No Food Insecurity (07/11/2024)   Epic    Worried About Programme Researcher, Broadcasting/film/video in the Last Year: Never true    Ran Out of Food in the Last Year: Never true  Transportation Needs: No Transportation Needs (07/11/2024)   Epic    Lack of Transportation (Medical): No    Lack of Transportation (Non-Medical): No  Physical Activity: Insufficiently Active (07/11/2024)   Exercise Vital Sign    Days of Exercise per Week: 7 days    Minutes of Exercise per Session: 20 min  Stress: No Stress Concern Present (07/11/2024)   [employer] of Occupational [employer] - Occupational Stress Questionnaire    Feeling of Stress: Only a little  Social Connections: Socially Isolated (07/11/2024)   Social Connection and Isolation Panel    Frequency of Communication with Friends and Family: Once a week    Frequency of Social Gatherings with Friends and Family: Once a week    Attends Religious Services: Never    Database Administrator or Organizations: No    Attends Engineer, Structural: Not on file    Marital Status: Widowed  Intimate Partner Violence: Not At Risk (05/19/2023)   Humiliation, Afraid, Rape, and  Kick questionnaire    Fear of Current or Ex-Partner: No    Emotionally Abused: No    Physically Abused: No    Sexually Abused: No  Depression (PHQ2-9): Low Risk (09/01/2024)   Depression (PHQ2-9)    PHQ-2 Score: 0  Alcohol Screen: Not on file  Housing: Low Risk (07/11/2024)   Epic    Unable to Pay for Housing in the Last Year: No    Number of Times Moved in the Last Year: 0    Homeless in the Last Year: No  Utilities: Not At Risk (05/19/2023)   [facility] Utilities    Threatened with loss of utilities: No  Health Literacy: Not on file    Past Medical History, Surgical history, Social history, and Family history were reviewed and updated as appropriate.   Please see review of systems for further details on the patient's review from today.   Objective:   Physical Exam:  There were no vitals taken for this visit.  Physical Exam Constitutional:      General: She is not in acute distress. Musculoskeletal:        General: No deformity.  Neurological:     Mental Status: She is alert and oriented to person, place, and time.     Coordination: Coordination normal.  Psychiatric:        Attention and Perception: Attention and perception normal. She does not perceive auditory or visual hallucinations.        Mood and Affect: Mood normal. Mood is not anxious or depressed. Affect is not labile, blunt, angry or inappropriate.        Speech: Speech normal.        Behavior: Behavior normal.        Thought Content: Thought content normal. Thought content is not paranoid or delusional. Thought content does not include homicidal or suicidal ideation. Thought content does not include homicidal or suicidal plan.        Cognition and Memory: Cognition and memory normal.        Judgment: Judgment normal.  Comments: Insight intact     Lab Review:     Component Value Date/Time   NA 140 07/15/2024 1118   NA 144 01/30/2024 1143   K 4.5 07/15/2024 1118   CL 104 07/15/2024 1118   CO2 28 07/15/2024  1118   GLUCOSE 110 (H) 07/15/2024 1118   BUN 24 (H) 07/15/2024 1118   BUN 23 01/30/2024 1143   CREATININE 1.31 (H) 07/15/2024 1118   CREATININE 0.80 09/25/2020 1443   CALCIUM  9.6 07/15/2024 1118   PROT 6.4 07/15/2024 1118   PROT 7.3 01/30/2024 1143   ALBUMIN  4.2 07/15/2024 1118   ALBUMIN  4.6 01/30/2024 1143   AST 20 07/15/2024 1118   ALT 13 07/15/2024 1118   ALKPHOS 71 07/15/2024 1118   BILITOT 0.7 07/15/2024 1118   BILITOT 0.5 01/30/2024 1143   GFRNONAA 37 (L) 01/01/2023 0506   GFRNONAA 74 09/25/2020 1443   GFRAA 86 09/25/2020 1443       Component Value Date/Time   WBC 5.7 07/15/2024 1118   RBC 3.78 (L) 07/15/2024 1118   HGB 11.7 (L) 07/15/2024 1118   HGB 12.6 01/30/2024 1143   HCT 35.1 (L) 07/15/2024 1118   HCT 38.6 01/30/2024 1143   PLT 283.0 07/15/2024 1118   PLT 342 01/30/2024 1143   MCV 92.8 07/15/2024 1118   MCV 94 01/30/2024 1143   MCH 30.8 01/30/2024 1143   MCH 31.3 12/20/2022 1430   MCHC 33.3 07/15/2024 1118   RDW 14.1 07/15/2024 1118   RDW 12.9 01/30/2024 1143   LYMPHSABS 2.2 07/15/2024 1118   LYMPHSABS 2.7 01/30/2024 1143   MONOABS 0.3 07/15/2024 1118   EOSABS 0.3 07/15/2024 1118   EOSABS 0.3 01/30/2024 1143   BASOSABS 0.0 07/15/2024 1118   BASOSABS 0.0 01/30/2024 1143    No results found for: POCLITH, LITHIUM   No results found for: PHENYTOIN, PHENOBARB, VALPROATE, CBMZ   .res Assessment: Plan:    Treatment Plan/Recommendations:   Plan:  PDMP reviewed  Add Viibryd  10mg  daily  Consider Abilify  25 minutes spent dedicated to the care of this patient on the date of this encounter to include pre-visit review of records, ordering of medication, post visit documentation, and face-to-face time with the patient discussing depression and anxiety. Discussed continuing current medication regimen.  RTC 4 weeks  Patient advised to contact office with any questions, adverse effects, or acute worsening in signs and symptoms.  There are  no diagnoses linked to this encounter.   Please see After Visit Summary for patient specific instructions.  Future Appointments  Date Time Provider Department Center  01/18/2025  3:30 PM Zackory Pudlo Nattalie, NP [facility] None    No orders of the defined types were placed in this encounter.     -------------------------------     [1]  Allergies Allergen Reactions   Simvastatin  Other (See Comments)    Myalgia   Sulfonamide Derivatives Rash

## 2025-02-18 ENCOUNTER — Ambulatory Visit: Admitting: Adult Health
